# Patient Record
Sex: Female | Born: 1935
Health system: Southern US, Community
[De-identification: ages and names within clinical notes are randomized; demographics above are authoritative.]

## PROBLEM LIST (undated history)

## (undated) DIAGNOSIS — G629 Polyneuropathy, unspecified: Secondary | ICD-10-CM

## (undated) DIAGNOSIS — G250 Essential tremor: Secondary | ICD-10-CM

## (undated) DIAGNOSIS — I10 Essential (primary) hypertension: Secondary | ICD-10-CM

## (undated) DIAGNOSIS — K219 Gastro-esophageal reflux disease without esophagitis: Secondary | ICD-10-CM

## (undated) DIAGNOSIS — E119 Type 2 diabetes mellitus without complications: Secondary | ICD-10-CM

## (undated) DIAGNOSIS — W19XXXA Unspecified fall, initial encounter: Secondary | ICD-10-CM

## (undated) HISTORY — DX: Essential tremor: G25.0

## (undated) HISTORY — PX: BREAST LUMPECTOMY: SHX2

## (undated) HISTORY — DX: Polyneuropathy, unspecified: G62.9

## (undated) HISTORY — DX: Type 2 diabetes mellitus without complications: E11.9

## (undated) HISTORY — DX: Gastro-esophageal reflux disease without esophagitis: K21.9

## (undated) HISTORY — DX: Essential (primary) hypertension: I10

---

## 1973-01-26 HISTORY — PX: ABDOMINAL HYSTERECTOMY: SHX81

## 2014-01-29 DIAGNOSIS — E119 Type 2 diabetes mellitus without complications: Secondary | ICD-10-CM | POA: Diagnosis not present

## 2014-01-29 DIAGNOSIS — G8929 Other chronic pain: Secondary | ICD-10-CM | POA: Diagnosis not present

## 2014-01-29 DIAGNOSIS — G20 Parkinson's disease: Secondary | ICD-10-CM | POA: Diagnosis not present

## 2014-01-29 DIAGNOSIS — S329XXD Fracture of unspecified parts of lumbosacral spine and pelvis, subsequent encounter for fracture with routine healing: Secondary | ICD-10-CM | POA: Diagnosis not present

## 2014-01-29 DIAGNOSIS — I10 Essential (primary) hypertension: Secondary | ICD-10-CM | POA: Diagnosis not present

## 2014-01-29 DIAGNOSIS — M159 Polyosteoarthritis, unspecified: Secondary | ICD-10-CM | POA: Diagnosis not present

## 2014-01-30 DIAGNOSIS — I10 Essential (primary) hypertension: Secondary | ICD-10-CM | POA: Diagnosis not present

## 2014-01-30 DIAGNOSIS — G8929 Other chronic pain: Secondary | ICD-10-CM | POA: Diagnosis not present

## 2014-01-30 DIAGNOSIS — G20 Parkinson's disease: Secondary | ICD-10-CM | POA: Diagnosis not present

## 2014-01-30 DIAGNOSIS — M159 Polyosteoarthritis, unspecified: Secondary | ICD-10-CM | POA: Diagnosis not present

## 2014-01-30 DIAGNOSIS — S329XXD Fracture of unspecified parts of lumbosacral spine and pelvis, subsequent encounter for fracture with routine healing: Secondary | ICD-10-CM | POA: Diagnosis not present

## 2014-01-30 DIAGNOSIS — E119 Type 2 diabetes mellitus without complications: Secondary | ICD-10-CM | POA: Diagnosis not present

## 2014-02-01 DIAGNOSIS — G20 Parkinson's disease: Secondary | ICD-10-CM | POA: Diagnosis not present

## 2014-02-01 DIAGNOSIS — M159 Polyosteoarthritis, unspecified: Secondary | ICD-10-CM | POA: Diagnosis not present

## 2014-02-01 DIAGNOSIS — I10 Essential (primary) hypertension: Secondary | ICD-10-CM | POA: Diagnosis not present

## 2014-02-01 DIAGNOSIS — S329XXD Fracture of unspecified parts of lumbosacral spine and pelvis, subsequent encounter for fracture with routine healing: Secondary | ICD-10-CM | POA: Diagnosis not present

## 2014-02-01 DIAGNOSIS — G8929 Other chronic pain: Secondary | ICD-10-CM | POA: Diagnosis not present

## 2014-02-01 DIAGNOSIS — E119 Type 2 diabetes mellitus without complications: Secondary | ICD-10-CM | POA: Diagnosis not present

## 2014-02-05 DIAGNOSIS — G8929 Other chronic pain: Secondary | ICD-10-CM | POA: Diagnosis not present

## 2014-02-05 DIAGNOSIS — M159 Polyosteoarthritis, unspecified: Secondary | ICD-10-CM | POA: Diagnosis not present

## 2014-02-05 DIAGNOSIS — I10 Essential (primary) hypertension: Secondary | ICD-10-CM | POA: Diagnosis not present

## 2014-02-05 DIAGNOSIS — G20 Parkinson's disease: Secondary | ICD-10-CM | POA: Diagnosis not present

## 2014-02-05 DIAGNOSIS — E119 Type 2 diabetes mellitus without complications: Secondary | ICD-10-CM | POA: Diagnosis not present

## 2014-02-05 DIAGNOSIS — S329XXD Fracture of unspecified parts of lumbosacral spine and pelvis, subsequent encounter for fracture with routine healing: Secondary | ICD-10-CM | POA: Diagnosis not present

## 2014-02-06 DIAGNOSIS — S32509A Unspecified fracture of unspecified pubis, initial encounter for closed fracture: Secondary | ICD-10-CM | POA: Diagnosis not present

## 2014-02-07 DIAGNOSIS — E119 Type 2 diabetes mellitus without complications: Secondary | ICD-10-CM | POA: Diagnosis not present

## 2014-02-07 DIAGNOSIS — I10 Essential (primary) hypertension: Secondary | ICD-10-CM | POA: Diagnosis not present

## 2014-02-07 DIAGNOSIS — G20 Parkinson's disease: Secondary | ICD-10-CM | POA: Diagnosis not present

## 2014-02-07 DIAGNOSIS — G8929 Other chronic pain: Secondary | ICD-10-CM | POA: Diagnosis not present

## 2014-02-07 DIAGNOSIS — S329XXD Fracture of unspecified parts of lumbosacral spine and pelvis, subsequent encounter for fracture with routine healing: Secondary | ICD-10-CM | POA: Diagnosis not present

## 2014-02-07 DIAGNOSIS — M159 Polyosteoarthritis, unspecified: Secondary | ICD-10-CM | POA: Diagnosis not present

## 2014-02-08 DIAGNOSIS — E119 Type 2 diabetes mellitus without complications: Secondary | ICD-10-CM | POA: Diagnosis not present

## 2014-02-08 DIAGNOSIS — S329XXD Fracture of unspecified parts of lumbosacral spine and pelvis, subsequent encounter for fracture with routine healing: Secondary | ICD-10-CM | POA: Diagnosis not present

## 2014-02-08 DIAGNOSIS — I10 Essential (primary) hypertension: Secondary | ICD-10-CM | POA: Diagnosis not present

## 2014-02-08 DIAGNOSIS — G8929 Other chronic pain: Secondary | ICD-10-CM | POA: Diagnosis not present

## 2014-02-08 DIAGNOSIS — G20 Parkinson's disease: Secondary | ICD-10-CM | POA: Diagnosis not present

## 2014-02-08 DIAGNOSIS — M159 Polyosteoarthritis, unspecified: Secondary | ICD-10-CM | POA: Diagnosis not present

## 2014-02-13 DIAGNOSIS — E119 Type 2 diabetes mellitus without complications: Secondary | ICD-10-CM | POA: Diagnosis not present

## 2014-02-13 DIAGNOSIS — S329XXD Fracture of unspecified parts of lumbosacral spine and pelvis, subsequent encounter for fracture with routine healing: Secondary | ICD-10-CM | POA: Diagnosis not present

## 2014-02-13 DIAGNOSIS — I10 Essential (primary) hypertension: Secondary | ICD-10-CM | POA: Diagnosis not present

## 2014-02-13 DIAGNOSIS — M159 Polyosteoarthritis, unspecified: Secondary | ICD-10-CM | POA: Diagnosis not present

## 2014-02-13 DIAGNOSIS — G8929 Other chronic pain: Secondary | ICD-10-CM | POA: Diagnosis not present

## 2014-02-13 DIAGNOSIS — G20 Parkinson's disease: Secondary | ICD-10-CM | POA: Diagnosis not present

## 2014-02-14 DIAGNOSIS — E119 Type 2 diabetes mellitus without complications: Secondary | ICD-10-CM | POA: Diagnosis not present

## 2014-02-14 DIAGNOSIS — S329XXD Fracture of unspecified parts of lumbosacral spine and pelvis, subsequent encounter for fracture with routine healing: Secondary | ICD-10-CM | POA: Diagnosis not present

## 2014-02-14 DIAGNOSIS — I10 Essential (primary) hypertension: Secondary | ICD-10-CM | POA: Diagnosis not present

## 2014-02-14 DIAGNOSIS — G20 Parkinson's disease: Secondary | ICD-10-CM | POA: Diagnosis not present

## 2014-02-14 DIAGNOSIS — G8929 Other chronic pain: Secondary | ICD-10-CM | POA: Diagnosis not present

## 2014-02-14 DIAGNOSIS — M159 Polyosteoarthritis, unspecified: Secondary | ICD-10-CM | POA: Diagnosis not present

## 2014-02-19 DIAGNOSIS — M159 Polyosteoarthritis, unspecified: Secondary | ICD-10-CM | POA: Diagnosis not present

## 2014-02-19 DIAGNOSIS — I10 Essential (primary) hypertension: Secondary | ICD-10-CM | POA: Diagnosis not present

## 2014-02-19 DIAGNOSIS — G8929 Other chronic pain: Secondary | ICD-10-CM | POA: Diagnosis not present

## 2014-02-19 DIAGNOSIS — S329XXD Fracture of unspecified parts of lumbosacral spine and pelvis, subsequent encounter for fracture with routine healing: Secondary | ICD-10-CM | POA: Diagnosis not present

## 2014-02-19 DIAGNOSIS — G20 Parkinson's disease: Secondary | ICD-10-CM | POA: Diagnosis not present

## 2014-02-19 DIAGNOSIS — E119 Type 2 diabetes mellitus without complications: Secondary | ICD-10-CM | POA: Diagnosis not present

## 2014-02-20 DIAGNOSIS — Z6824 Body mass index (BMI) 24.0-24.9, adult: Secondary | ICD-10-CM | POA: Diagnosis not present

## 2014-02-20 DIAGNOSIS — F418 Other specified anxiety disorders: Secondary | ICD-10-CM | POA: Diagnosis not present

## 2014-02-20 DIAGNOSIS — E559 Vitamin D deficiency, unspecified: Secondary | ICD-10-CM | POA: Diagnosis not present

## 2014-02-20 DIAGNOSIS — S32464S Nondisplaced associated transverse-posterior fracture of right acetabulum, sequela: Secondary | ICD-10-CM | POA: Diagnosis not present

## 2014-02-20 DIAGNOSIS — G20 Parkinson's disease: Secondary | ICD-10-CM | POA: Diagnosis not present

## 2014-02-20 DIAGNOSIS — M159 Polyosteoarthritis, unspecified: Secondary | ICD-10-CM | POA: Diagnosis not present

## 2014-02-20 DIAGNOSIS — K219 Gastro-esophageal reflux disease without esophagitis: Secondary | ICD-10-CM | POA: Diagnosis not present

## 2014-02-20 DIAGNOSIS — F17200 Nicotine dependence, unspecified, uncomplicated: Secondary | ICD-10-CM | POA: Diagnosis not present

## 2014-02-20 DIAGNOSIS — R1084 Generalized abdominal pain: Secondary | ICD-10-CM | POA: Diagnosis not present

## 2014-02-20 DIAGNOSIS — E119 Type 2 diabetes mellitus without complications: Secondary | ICD-10-CM | POA: Diagnosis not present

## 2014-02-20 DIAGNOSIS — Z0000 Encounter for general adult medical examination without abnormal findings: Secondary | ICD-10-CM | POA: Diagnosis not present

## 2014-02-23 DIAGNOSIS — G8929 Other chronic pain: Secondary | ICD-10-CM | POA: Diagnosis not present

## 2014-02-23 DIAGNOSIS — M159 Polyosteoarthritis, unspecified: Secondary | ICD-10-CM | POA: Diagnosis not present

## 2014-02-23 DIAGNOSIS — I10 Essential (primary) hypertension: Secondary | ICD-10-CM | POA: Diagnosis not present

## 2014-02-23 DIAGNOSIS — G20 Parkinson's disease: Secondary | ICD-10-CM | POA: Diagnosis not present

## 2014-02-23 DIAGNOSIS — S329XXD Fracture of unspecified parts of lumbosacral spine and pelvis, subsequent encounter for fracture with routine healing: Secondary | ICD-10-CM | POA: Diagnosis not present

## 2014-02-23 DIAGNOSIS — E119 Type 2 diabetes mellitus without complications: Secondary | ICD-10-CM | POA: Diagnosis not present

## 2014-03-13 DIAGNOSIS — G243 Spasmodic torticollis: Secondary | ICD-10-CM | POA: Diagnosis not present

## 2014-05-08 DIAGNOSIS — J329 Chronic sinusitis, unspecified: Secondary | ICD-10-CM | POA: Diagnosis not present

## 2014-05-08 DIAGNOSIS — Z5320 Procedure and treatment not carried out because of patient's decision for unspecified reasons: Secondary | ICD-10-CM | POA: Diagnosis not present

## 2014-05-08 DIAGNOSIS — F418 Other specified anxiety disorders: Secondary | ICD-10-CM | POA: Diagnosis not present

## 2014-05-08 DIAGNOSIS — Z6825 Body mass index (BMI) 25.0-25.9, adult: Secondary | ICD-10-CM | POA: Diagnosis not present

## 2014-05-08 DIAGNOSIS — S32464S Nondisplaced associated transverse-posterior fracture of right acetabulum, sequela: Secondary | ICD-10-CM | POA: Diagnosis not present

## 2014-05-08 DIAGNOSIS — Z0001 Encounter for general adult medical examination with abnormal findings: Secondary | ICD-10-CM | POA: Diagnosis not present

## 2014-05-08 DIAGNOSIS — G20 Parkinson's disease: Secondary | ICD-10-CM | POA: Diagnosis not present

## 2014-05-08 DIAGNOSIS — K219 Gastro-esophageal reflux disease without esophagitis: Secondary | ICD-10-CM | POA: Diagnosis not present

## 2014-05-08 DIAGNOSIS — E1140 Type 2 diabetes mellitus with diabetic neuropathy, unspecified: Secondary | ICD-10-CM | POA: Diagnosis not present

## 2014-05-08 DIAGNOSIS — I10 Essential (primary) hypertension: Secondary | ICD-10-CM | POA: Diagnosis not present

## 2014-06-18 DIAGNOSIS — G243 Spasmodic torticollis: Secondary | ICD-10-CM | POA: Diagnosis not present

## 2014-06-21 DIAGNOSIS — H52222 Regular astigmatism, left eye: Secondary | ICD-10-CM | POA: Diagnosis not present

## 2014-06-21 DIAGNOSIS — H25013 Cortical age-related cataract, bilateral: Secondary | ICD-10-CM | POA: Diagnosis not present

## 2014-07-02 DIAGNOSIS — H269 Unspecified cataract: Secondary | ICD-10-CM | POA: Diagnosis not present

## 2014-07-02 DIAGNOSIS — Z6826 Body mass index (BMI) 26.0-26.9, adult: Secondary | ICD-10-CM | POA: Diagnosis not present

## 2014-07-02 DIAGNOSIS — Z01818 Encounter for other preprocedural examination: Secondary | ICD-10-CM | POA: Diagnosis not present

## 2014-07-02 DIAGNOSIS — S32464S Nondisplaced associated transverse-posterior fracture of right acetabulum, sequela: Secondary | ICD-10-CM | POA: Diagnosis not present

## 2014-07-02 DIAGNOSIS — E1140 Type 2 diabetes mellitus with diabetic neuropathy, unspecified: Secondary | ICD-10-CM | POA: Diagnosis not present

## 2014-07-02 DIAGNOSIS — F418 Other specified anxiety disorders: Secondary | ICD-10-CM | POA: Diagnosis not present

## 2014-07-02 DIAGNOSIS — G20 Parkinson's disease: Secondary | ICD-10-CM | POA: Diagnosis not present

## 2014-07-25 DIAGNOSIS — I10 Essential (primary) hypertension: Secondary | ICD-10-CM | POA: Diagnosis not present

## 2014-07-25 DIAGNOSIS — E049 Nontoxic goiter, unspecified: Secondary | ICD-10-CM | POA: Diagnosis not present

## 2014-07-25 DIAGNOSIS — H25011 Cortical age-related cataract, right eye: Secondary | ICD-10-CM | POA: Diagnosis not present

## 2014-07-25 DIAGNOSIS — F418 Other specified anxiety disorders: Secondary | ICD-10-CM | POA: Diagnosis not present

## 2014-07-25 DIAGNOSIS — H25811 Combined forms of age-related cataract, right eye: Secondary | ICD-10-CM | POA: Diagnosis not present

## 2014-08-16 DIAGNOSIS — R05 Cough: Secondary | ICD-10-CM | POA: Diagnosis not present

## 2014-08-16 DIAGNOSIS — H6092 Unspecified otitis externa, left ear: Secondary | ICD-10-CM | POA: Diagnosis not present

## 2014-08-16 DIAGNOSIS — J020 Streptococcal pharyngitis: Secondary | ICD-10-CM | POA: Diagnosis not present

## 2014-08-16 DIAGNOSIS — J0100 Acute maxillary sinusitis, unspecified: Secondary | ICD-10-CM | POA: Diagnosis not present

## 2014-08-29 DIAGNOSIS — F418 Other specified anxiety disorders: Secondary | ICD-10-CM | POA: Diagnosis not present

## 2014-08-29 DIAGNOSIS — H25012 Cortical age-related cataract, left eye: Secondary | ICD-10-CM | POA: Diagnosis not present

## 2014-08-29 DIAGNOSIS — H268 Other specified cataract: Secondary | ICD-10-CM | POA: Diagnosis not present

## 2014-08-29 DIAGNOSIS — E119 Type 2 diabetes mellitus without complications: Secondary | ICD-10-CM | POA: Diagnosis not present

## 2014-08-29 DIAGNOSIS — H25812 Combined forms of age-related cataract, left eye: Secondary | ICD-10-CM | POA: Diagnosis not present

## 2014-08-29 DIAGNOSIS — I10 Essential (primary) hypertension: Secondary | ICD-10-CM | POA: Diagnosis not present

## 2014-08-29 DIAGNOSIS — H52202 Unspecified astigmatism, left eye: Secondary | ICD-10-CM | POA: Diagnosis not present

## 2014-08-30 DIAGNOSIS — Z961 Presence of intraocular lens: Secondary | ICD-10-CM | POA: Diagnosis not present

## 2014-10-12 DIAGNOSIS — G243 Spasmodic torticollis: Secondary | ICD-10-CM | POA: Diagnosis not present

## 2014-10-15 DIAGNOSIS — Z23 Encounter for immunization: Secondary | ICD-10-CM | POA: Diagnosis not present

## 2014-10-15 DIAGNOSIS — Z6827 Body mass index (BMI) 27.0-27.9, adult: Secondary | ICD-10-CM | POA: Diagnosis not present

## 2014-10-15 DIAGNOSIS — Z0001 Encounter for general adult medical examination with abnormal findings: Secondary | ICD-10-CM | POA: Diagnosis not present

## 2014-10-15 DIAGNOSIS — I10 Essential (primary) hypertension: Secondary | ICD-10-CM | POA: Diagnosis not present

## 2014-10-15 DIAGNOSIS — K219 Gastro-esophageal reflux disease without esophagitis: Secondary | ICD-10-CM | POA: Diagnosis not present

## 2014-10-15 DIAGNOSIS — F17200 Nicotine dependence, unspecified, uncomplicated: Secondary | ICD-10-CM | POA: Diagnosis not present

## 2014-10-15 DIAGNOSIS — E1140 Type 2 diabetes mellitus with diabetic neuropathy, unspecified: Secondary | ICD-10-CM | POA: Diagnosis not present

## 2014-10-15 DIAGNOSIS — R05 Cough: Secondary | ICD-10-CM | POA: Diagnosis not present

## 2014-10-15 DIAGNOSIS — F418 Other specified anxiety disorders: Secondary | ICD-10-CM | POA: Diagnosis not present

## 2014-10-15 DIAGNOSIS — G20 Parkinson's disease: Secondary | ICD-10-CM | POA: Diagnosis not present

## 2014-10-15 DIAGNOSIS — S32464S Nondisplaced associated transverse-posterior fracture of right acetabulum, sequela: Secondary | ICD-10-CM | POA: Diagnosis not present

## 2014-11-14 DIAGNOSIS — K279 Peptic ulcer, site unspecified, unspecified as acute or chronic, without hemorrhage or perforation: Secondary | ICD-10-CM | POA: Diagnosis not present

## 2014-11-14 DIAGNOSIS — R251 Tremor, unspecified: Secondary | ICD-10-CM | POA: Diagnosis not present

## 2014-11-14 DIAGNOSIS — E1142 Type 2 diabetes mellitus with diabetic polyneuropathy: Secondary | ICD-10-CM | POA: Diagnosis not present

## 2014-11-14 DIAGNOSIS — G20 Parkinson's disease: Secondary | ICD-10-CM | POA: Diagnosis not present

## 2014-11-14 DIAGNOSIS — B370 Candidal stomatitis: Secondary | ICD-10-CM | POA: Diagnosis not present

## 2014-11-14 DIAGNOSIS — Z1389 Encounter for screening for other disorder: Secondary | ICD-10-CM | POA: Diagnosis not present

## 2014-11-14 DIAGNOSIS — E119 Type 2 diabetes mellitus without complications: Secondary | ICD-10-CM | POA: Diagnosis not present

## 2014-11-14 DIAGNOSIS — K8020 Calculus of gallbladder without cholecystitis without obstruction: Secondary | ICD-10-CM | POA: Diagnosis not present

## 2014-11-14 DIAGNOSIS — F325 Major depressive disorder, single episode, in full remission: Secondary | ICD-10-CM | POA: Diagnosis not present

## 2014-11-14 DIAGNOSIS — G629 Polyneuropathy, unspecified: Secondary | ICD-10-CM | POA: Diagnosis not present

## 2014-11-14 DIAGNOSIS — L299 Pruritus, unspecified: Secondary | ICD-10-CM | POA: Diagnosis not present

## 2014-11-14 DIAGNOSIS — I10 Essential (primary) hypertension: Secondary | ICD-10-CM | POA: Diagnosis not present

## 2014-11-21 ENCOUNTER — Ambulatory Visit: Payer: Self-pay | Admitting: Neurology

## 2014-12-24 ENCOUNTER — Ambulatory Visit: Payer: Self-pay | Admitting: Neurology

## 2014-12-26 ENCOUNTER — Ambulatory Visit: Payer: Self-pay | Admitting: Neurology

## 2015-01-11 ENCOUNTER — Ambulatory Visit: Payer: Self-pay | Admitting: Neurology

## 2015-03-20 ENCOUNTER — Encounter: Payer: Self-pay | Admitting: Neurology

## 2015-03-25 ENCOUNTER — Other Ambulatory Visit: Payer: Self-pay

## 2015-03-25 ENCOUNTER — Encounter: Payer: Self-pay | Admitting: Neurology

## 2015-03-25 ENCOUNTER — Ambulatory Visit (INDEPENDENT_AMBULATORY_CARE_PROVIDER_SITE_OTHER): Payer: Medicare Other | Admitting: Neurology

## 2015-03-25 VITALS — BP 120/70 | HR 62

## 2015-03-25 DIAGNOSIS — L299 Pruritus, unspecified: Secondary | ICD-10-CM | POA: Diagnosis not present

## 2015-03-25 DIAGNOSIS — K279 Peptic ulcer, site unspecified, unspecified as acute or chronic, without hemorrhage or perforation: Secondary | ICD-10-CM | POA: Diagnosis not present

## 2015-03-25 DIAGNOSIS — G629 Polyneuropathy, unspecified: Secondary | ICD-10-CM | POA: Diagnosis not present

## 2015-03-25 DIAGNOSIS — G250 Essential tremor: Secondary | ICD-10-CM | POA: Diagnosis not present

## 2015-03-25 DIAGNOSIS — N183 Chronic kidney disease, stage 3 (moderate): Secondary | ICD-10-CM | POA: Diagnosis not present

## 2015-03-25 DIAGNOSIS — E1142 Type 2 diabetes mellitus with diabetic polyneuropathy: Secondary | ICD-10-CM | POA: Diagnosis not present

## 2015-03-25 DIAGNOSIS — G4700 Insomnia, unspecified: Secondary | ICD-10-CM

## 2015-03-25 DIAGNOSIS — Z1389 Encounter for screening for other disorder: Secondary | ICD-10-CM | POA: Diagnosis not present

## 2015-03-25 DIAGNOSIS — F325 Major depressive disorder, single episode, in full remission: Secondary | ICD-10-CM | POA: Diagnosis not present

## 2015-03-25 DIAGNOSIS — I10 Essential (primary) hypertension: Secondary | ICD-10-CM | POA: Diagnosis not present

## 2015-03-25 DIAGNOSIS — R251 Tremor, unspecified: Secondary | ICD-10-CM | POA: Diagnosis not present

## 2015-03-25 DIAGNOSIS — F17210 Nicotine dependence, cigarettes, uncomplicated: Secondary | ICD-10-CM | POA: Diagnosis not present

## 2015-03-25 DIAGNOSIS — E782 Mixed hyperlipidemia: Secondary | ICD-10-CM | POA: Diagnosis not present

## 2015-03-25 DIAGNOSIS — Z0000 Encounter for general adult medical examination without abnormal findings: Secondary | ICD-10-CM | POA: Diagnosis not present

## 2015-03-25 DIAGNOSIS — Z1231 Encounter for screening mammogram for malignant neoplasm of breast: Secondary | ICD-10-CM

## 2015-03-25 MED ORDER — CLONAZEPAM 1 MG PO TABS: 1.5000 mg | ORAL_TABLET | Freq: Every day | ORAL | Status: DC

## 2015-03-25 NOTE — Progress Notes (Signed)
Kristen Mason was seen today in the movement disorders clinic for neurologic consultation at the request of HUSAIN,KARRAR, MD.   The patient presents today regarding her Parkinson's disease, accompanied by her daughter who supplements the history.  She was previously cared for in [place], [place].  I do not have any of her prior neurology records.  She has been scheduled here multiple previous times but has cancelled.  The patient reports that she was told she had "parkinsonian tremor."   She started having tremor in both hands about 11-2 years ago.  She is right hand dominant but both shake about the same.  She states that head tremor started increasing about 5 years ago and her prior neurologist gave her botox for that.  Her last botox was in September, 2016 and it helped.  She is on a rather large dose of primidone, 250 mg twice a day that she states that she takes for tremor.  It was initially helpful but now she is not so sure.   She is also on carbidopa/levodopa 25/100, one tablet 3 times per day.  She was started on that at the same time as primidone 9 years ago and then it was d/c and she tried something else (they don't remember the name of that) and it didn't work and she went back on the levodopa.    She uses clonazepam, 1 mg, and takes 1 tablets at night for sleep and if she wakes up she will take an extra 1/2 tablet.  She is also on 800 mg 3 times a day of gabapentin for diabetic peripheral neuropathy.   Specific Symptoms:  Tremor: Yes.     Affected by caffeine:  No. (3 cups coffee per day)  Affected by alcohol:  Doesn't drink any  Affected by stress:  Yes.    Affected by fatigue:  No.  Spills soup if on spoon:  Yes.   (has weighted spoons)  Spills glass of liquid if full:  Yes.    Affects ADL's (tying shoes, brushing teeth, etc):  Yes.     Voice: yes x 5 years Sleep: using klonopin to sleep  Vivid Dreams:  No.  Acting out dreams:  No. Wet Pillows: No. Postural symptoms:  Yes.      Falls?  Yes.   (last fall 2 months ago; does well with walker; falls when she is trying to pick something up - no spontaneous falls) Bradykinesia symptoms: no bradykinesia noted Loss of smell:  No. Loss of taste:  No. Urinary Incontinence:  No. Difficulty Swallowing:  Yes.  , rarely due to goiter Handwriting, micrographia: Yes.   but mostly just tremulous Trouble with ADL's:  No., except needs help putting on bra Depression:  Yes.   (only because couldn't get out of the house regularly) Memory changes:  No. Hallucinations:  No.  visual distortions: No. N/V:  No.   Neuroimaging has previously been performed.  It is not available for my review today.  It was done after a fall years ago    ALLERGIES:  No Known Allergies  CURRENT MEDICATIONS:  Outpatient Encounter Prescriptions as of 03/25/2015  Medication Sig  . amLODipine (NORVASC) 10 MG tablet Take 10 mg by mouth daily.  Marland Kitchen aspirin 81 MG tablet Take 81 mg by mouth daily.  . carbidopa-levodopa (SINEMET IR) 25-100 MG tablet Take 1 tablet by mouth 3 (three) times daily.  . citalopram (CELEXA) 40 MG tablet Take 40 mg by mouth daily.  . clonazePAM (KLONOPIN)  1 MG tablet Take 1.5 mg by mouth at bedtime.  . dicyclomine (BENTYL) 10 MG capsule Take 10 mg by mouth 4 (four) times daily -  before meals and at bedtime.  . diphenhydrAMINE (SOMINEX) 25 MG tablet Take 25 mg by mouth at bedtime as needed for sleep.  Marland Kitchen gabapentin (NEURONTIN) 800 MG tablet Take 800 mg by mouth 3 (three) times daily.  . hydrOXYzine (ATARAX/VISTARIL) 25 MG tablet Take 25 mg by mouth at bedtime.  Marland Kitchen lisinopril (PRINIVIL,ZESTRIL) 20 MG tablet Take 20 mg by mouth daily.  . pantoprazole (PROTONIX) 40 MG tablet Take 40 mg by mouth daily.  . primidone (MYSOLINE) 250 MG tablet Take 250 mg by mouth 2 (two) times daily.  . traMADol (ULTRAM) 50 MG tablet Take 50 mg by mouth every 6 (six) hours as needed.   No facility-administered encounter medications on file as of 03/25/2015.     PAST MEDICAL HISTORY:   Past Medical History  Diagnosis Date  . Essential tremor   . Hypertension   . Diabetes ([place])   . GERD (gastroesophageal reflux disease)   . Peripheral neuropathy ([place])     PAST SURGICAL HISTORY:   Past Surgical History  Procedure Laterality Date  . Abdominal hysterectomy  1975  . Breast lumpectomy Right     non cancerous    SOCIAL HISTORY:   Social History   Social History  . Marital Status: Unknown    Spouse Name: N/A  . Number of Children: N/A  . Years of Education: N/A   Occupational History  . Not on file.   Social History Main Topics  . Smoking status: Current Every Day Smoker -- 1.00 packs/day    Types: Cigarettes  . Smokeless tobacco: Not on file  . Alcohol Use: No  . Drug Use: No  . Sexual Activity: Not on file   Other Topics Concern  . Not on file   Social History Narrative  . No narrative on file    FAMILY HISTORY:   Family Status  Relation Status Death Age  . Mother Deceased     kidney problems  . Father Deceased     HTN, stroke, pacemaker  . Brother Deceased     MI    ROS:  A complete 10 system review of systems was obtained and was unremarkable apart from what is mentioned above.  PHYSICAL EXAMINATION:    VITALS:   Filed Vitals:   03/25/15 0839  BP: 120/70  Pulse: 62    GEN:  The patient appears stated age and is in NAD. HEENT:  Normocephalic, atraumatic.  The mucous membranes are moist. The superficial temporal arteries are without ropiness or tenderness. CV:  RRR Lungs:  CTAB Neck/HEME:  There are no carotid bruits bilaterally.  Neurological examination:  Orientation:  Montreal Cognitive Assessment  03/25/2015  Visuospatial/ Executive (0/5) 3  Naming (0/3) 2  Attention: Read list of digits (0/2) 2  Attention: Read list of letters (0/1) 1  Attention: Serial 7 subtraction starting at 100 (0/3) 1  Language: Repeat phrase (0/2) 2  Language : Fluency (0/1) 0  Abstraction (0/2) 2  Delayed Recall  (0/5) 4  Orientation (0/6) 6  Total 23  Adjusted Score (based on education) 24   Cranial nerves: There is good facial symmetry. Pupils are equal round and reactive to light bilaterally. Fundoscopic exam is attempted but disc margins are not well visualized. Extraocular muscles are intact. The visual fields are full to confrontational testing. The speech is fluent  and clear with significant vocal tremor. Soft palate rises symmetrically and there is no tongue deviation. Hearing is intact to conversational tone. Sensation: Sensation is intact to light and pinprick throughout (facial, trunk, extremities). Vibration is markedly decreased in a distal fashion. There is no extinction with double simultaneous stimulation. There is no sensory dermatomal level identified. Motor: Strength is 5/5 in the bilateral upper and lower extremities.   Shoulder shrug is equal and symmetric.  There is no pronator drift. Deep tendon reflexes: Deep tendon reflexes are 2-/4 at the bilateral biceps, triceps, brachioradialis, 1/4 at the bilateral patella and absent at the bilateral achilles. Plantar responses are downgoing bilaterally.  Movement examination: Tone: There is normal tone in the bilateral upper extremities.  The tone in the lower extremities is normal.  Abnormal movements: There is a resting tremor in the left greater than right upper extremity.  There is very significant intention tremor bilaterally.  There is complex head titubation, but there is no evidence of cervical dystonia.  Head titubation is mostly in the "yes" direction.  She has very significant difficulty with Archimedes spirals, and has difficulty just getting the pen on the paper.  She spills water all over the room and on herself when asked to pour a full glass of water from one glass to another. Coordination:  There is no decremation with RAM's, with any form of RAMS, including alternating supination and pronation of the forearm, hand opening and  closing, finger taps, heel taps and toe taps. Gait and Station: The patient has difficulty arising out of a deep-seated chair without the use of the hands and requires the use of a walker. The patient's stride length is normal with her walker.    ASSESSMENT/PLAN:  1.  Severe essential tremor.  -I told the patient and her daughter that I saw no evidence of Parkinson's disease.  She has had the symptoms for over a decade, so if she would have Parkinson's disease, this should have shown up by now.  She has rest tremor because of the severity of the essential tremor and the length of time it has been going on.  I would discontinue her levodopa.  -She will continue her primidone, 250 mg twice a day.  -I was very honest with the patient and her daughter.  I told her that I do not think any further medication additions are going to help tremor significantly.  Unfortunately, if she wants further tremor suppression, the only other option is going to be surgical options in the form of either deep brain stimulation or focused ultrasound.  We talked about both of these options.  We talked about risks and benefits.  She was given patient education on this.  I gave them the opportunity to ask questions and answered them to the best of my ability.  She is going to think about this and talk about it with her family.  -I will try to get a copy of records from her prior neurologist in [place].  She was apparently receiving Botox in the neck previously for head tremor.  However, I saw no evidence of cervical dystonia and I am not sure which muscles, therefore, were targeted.  I will review the records when available to me.  2.  Gait instability  -This is likely multifactorial, although diabetic peripheral neuropathy probably is the primary etiology.  Her daughter does state that it is much better controlled that was years ago.  Prior to the patient moving in with her daughter  10 years ago, the patient was on insulin and  metformin and she is now off of both of these medications.  She is scheduled to have her hemoglobin A1c repeated later today.  She will remain on gabapentin, 800 mg 3 times per day.  She definitely has hand and feet paresthesias.  I will refill this when she needs it.  -It is believed that ET alone can cause ataxia/balance changes due to changes in the cerebellar purkinje cell/climbing fiber synaptic transmission.  This certainly could be another etiology for her gait changes.  3.  Insomnia  -Reports that she is doing better with clonazepam.  I really do not like this on the long-term, but she seems to be doing well with that and is not having cognitive side effects or falls.  I did refill this medication today cautiously.  She is on 1 mg at night.  Risks, benefits, side effects and alternative therapies were discussed.  The opportunity to ask questions was given and they were answered to the best of my ability.  The patient expressed understanding and willingness to follow the outlined treatment protocols.  4.  Follow up is anticipated in the next few months, sooner should new neurologic issues arise.  Much greater than 50% of this visit was spent in counseling with the patient and the family.  Total face to face time:  60 min

## 2015-03-25 NOTE — Addendum Note (Signed)
Addended byAnnamaria Helling on: 03/25/2015 11:09 AM   Modules accepted: Orders

## 2015-03-25 NOTE — Patient Instructions (Signed)
1. Stop Carbidopa Levodopa     Deep Brain Stimulation  Is it the right choice for me?   What is Deep Brain Stimulation (DBS) Surgery?  DBS is a surgical procedure used to treat symptoms of Essential Tremor (ET). It involves the implantation of an electrode into the brain either on one side or both sides. The area of the brain that is typically targeted in ET is the ventral intermediate (VIM) nucleus of the thalamus.   How does DBS work?  The tremor in ET is due to an overactive and oscillating circuit in the brain. With DBS, electricity is sent down the electrodes into this area of the brain to disrupt this abnormal circuit, thereby blocking the tremor. It is important to remember, however, that DBS does not "cure" the disease, but rather is a method of treating the symptoms.   What is involved in the procedure?  The surgical procedure involves the implantation of either one (to treat tremor on one side of the body) or two (to treat tremor on both sides of the body) electrodes. The electrodes are connected to a wire, which is then connected to a generator (either one or two) in the chest. The generator (and wires) are placed under the skin similar to a cardiac pacemaker, thus the device itself is not visible. There will,  however, be a visible lump under the skin where the pacemaker is placed. There will also be small bumps on the scalp where the electrode is secured to the skull.   What symptoms can I expect DBS to treat?  In ET the main symptom is the tremor which the DBS system will treat.   What is the downside to having DBS surgery?  There are 2 major factors that need to be considered prior to having surgery, 1) Risks involved, and 2) the "inconvenience factor".   What are the risks of surgery?  Because the surgery involves introducing a foreign object into the brain, there are inherent risks that are present. First, there is a very small risk, approximately 1-2% chance, of having  bleeding into the brain causing symptoms similar to that of a stroke. Secondly, there is a 5-10% chance of having an infection related to the procedure. If the device gets infected, then the treatment usually requires that the infected hardware be removed temporarily while antibiotics are given. After the infection is resolved, then the hardware needs to be re-implanted. This would not leave the patient with permanent problems, but it is easy to understand how disappointed someone might be if they have to go through the surgery again. Symptoms of infection include redness, swelling, or pain around the device. Another risk of brain surgery includes possible seizure. Seizures are abnormal electrical discharges of brain cells. If a seizure occurs, it is almost always at the time of operation. It may require temporarily being treated with seizure medications, but this is typically only short term. Very rarely (much less than 1%) does the infection become more serious and involve the brain.  In some patients with ET we will place electrodes on both sides of the brain to treat tremor on both sides of the body. In these patients there is a 30% chance of causing speech or balance problems when both sides are fully activated. To get rid of this side effect, the intensity of stimulation usually needs to be lowered in one of the electrodes. If this is done, the side effect may well resolve, but some of the tremor may  return.  If the electrodes are perfectly placed and programmed, we can expect an impressive improvement in your tremor. If they are close, but not quite perfectly placed, then there may be some residual tremor  which does not resolve with treatment. This is the main reason we go to such lengths the day of the electrode implantation to be sure we have the electrode optimally placed.   What is the "inconvenience factor"?  Unfortunately, undergoing DBS surgery is a process involving multiple steps. Even prior  to surgery, you will have several visits (which are explained in detail on the subsequent page). The surgery itself takes place in three separate parts. About a week prior to insertion of the electrodes, you will be seen in an ambulatory surgery center to put in markers into the skull, called fiducials. This allows [place] to plan the surgery and to better localize the area in which we will operate. One week later, stage 1 of the procedure will be done in which the electrodes are implanted. Approximately one week later, stage 2 of the surgery will be done in which the generator (battery) is inserted. Weeks later, programming of the device will take place. Programming is fine-tuned over a series of clinic visits, initially 1-2x per week, and then eventually less frequent. Overall, you can expect at least 8-10 visits prior to seeing benefit from DBS surgery.   Is DBS the right choice for you?  As you can now see, there are many things that carefully need to be considered when making this decision. The ultimate decision is yours to make. ET is not a life threatening disease. It can be very disabling and significantly interfere with one's quality of life. It is our job to provide you with all the pros and cons that can help you make the right choice for you. The main question is whether the tremor is so bothersome to you that it is worth it to take a small but significant risk to treat it. We hope this information will guide you in your decision.   Pre-Operative Visits:  1) During this visit your exam will be videotaped with your consent. We will discuss the details of the surgery and answer any more questions you might have.  2) Neuropsychological Testing. This is standard testing in all potential candidates to help determine those patients that may be at high risk for developing worsening cognition from the procedure. This is a long clinic visit (multiple hours) and can be quite exhausting.  3) Pre-Operative MRI.  If you are deemed to be a good surgical candidate based on the above 2 visits, you will need to have MRI imaging done to be used in the surgical planning process. You will need someone to accompany you to this visit that can drive, as sometimes it is necessary to sedate you for the MRI in order to get adequate pictures.  4) You will also have an appointment with the neurosurgeon who works with Dr. Carles Collet. His name is Dr. Vertell Limber.  What to expect regarding the surgery itself:  1. The first step involves placement of the fiducial pins. You are given 5 local injections of anesthetic (numbing medication). Next, 5 pins are screwed into the skull. Following the placement of the pins, you will be transferred down to have a head CT scan. This is used in planning for the surgery. This step is generally done one week prior to scheduled surgery.  2. One week later, you will arrive to the pre-op  area OFF of any tremor medications that may have been prescribed. You will meet nursing and anesthesia staff. A catheter may be placed into the bladder.  3. You will have the sense of "hurry up and wait" multiple times throughout the day, but it is extremely important to remain as patient as possible. It is during these times that we are busy "behind the scenes" doing the surgical planning with all of the imaging scans that you've had done.  4. You are then brought into the OR suite and placed in a "beach chair"-like position. You will not be under general anesthesia. We need you to be awake during certain parts to allow [place] to do important testing. Because you are awake and having brain surgery, it is not surprising that this is very anxiety provoking and scary! We understand this and will help you through it to the best of our abilities. The actual surgical procedure is not painful. It is done with local anesthetic agents. However, the procedure can take up to 6-8 hours, and it is expected that you'll become uncomfortable. We try to  minimize any sedating medications, but will give you pain medicines if needed.  5. You will have a bad haircut, but it will grow back!  6. Following the surgery, you will stay overnight in the hospital for observation.  7. The following day, you will have a very special kind brain MRI scan to allow [place] to evaluate the placement of the electrodes and also to make sure there were no bleeding complications. Provided there are no complications, you will be discharged home the day after surgery.   * It is extremely important to remember that after having DBS surgery, you will no longer be able to have a typical MRI scan. This can lead to heating of the electrode wires causing serious burns to the brain and even death.   8. About 1 weeks later you will return for an outpatient surgery that lasts 1-2 hours during which the generator(s) will be placed. You will go home on the same day as the surgery. You will find that you are more uncomfortable after this surgery then your first surgery. You will be given medications to help with this. The pain from this surgery usually resolves in 2-3 days.

## 2015-04-11 ENCOUNTER — Ambulatory Visit
Admission: RE | Admit: 2015-04-11 | Discharge: 2015-04-11 | Disposition: A | Discharge status: Home / Self Care | Payer: Medicare Other | Source: Ambulatory Visit

## 2015-04-11 DIAGNOSIS — Z1231 Encounter for screening mammogram for malignant neoplasm of breast: Secondary | ICD-10-CM

## 2015-04-15 DIAGNOSIS — C44729 Squamous cell carcinoma of skin of left lower limb, including hip: Secondary | ICD-10-CM | POA: Diagnosis not present

## 2015-06-25 ENCOUNTER — Ambulatory Visit: Payer: Medicare Other | Admitting: Neurology

## 2015-10-28 ENCOUNTER — Telehealth: Payer: Self-pay | Admitting: Neurology

## 2015-10-28 NOTE — Telephone Encounter (Signed)
RX denied and faxed to pharmacy.

## 2015-10-28 NOTE — Telephone Encounter (Signed)
She no showed her may follow up appt.  Cannot refill.

## 2015-10-28 NOTE — Telephone Encounter (Signed)
Received refill request from [facility] for Klonopin 1 mg tablet to take one at bedtime. Last note state you had refilled cautiously as patient was using for insomnia. You have only seen patient once in February. She does not have a follow up scheduled. Please advise.

## 2016-02-10 ENCOUNTER — Other Ambulatory Visit: Payer: Self-pay | Admitting: Internal Medicine

## 2016-02-10 ENCOUNTER — Ambulatory Visit
Admission: RE | Admit: 2016-02-10 | Discharge: 2016-02-10 | Disposition: A | Discharge status: Home / Self Care | Payer: Medicare Other | Source: Ambulatory Visit | Attending: Internal Medicine | Admitting: Internal Medicine

## 2016-02-10 DIAGNOSIS — G629 Polyneuropathy, unspecified: Secondary | ICD-10-CM | POA: Diagnosis not present

## 2016-02-10 DIAGNOSIS — E782 Mixed hyperlipidemia: Secondary | ICD-10-CM | POA: Diagnosis not present

## 2016-02-10 DIAGNOSIS — Z23 Encounter for immunization: Secondary | ICD-10-CM | POA: Diagnosis not present

## 2016-02-10 DIAGNOSIS — F17210 Nicotine dependence, cigarettes, uncomplicated: Secondary | ICD-10-CM

## 2016-02-10 DIAGNOSIS — N183 Chronic kidney disease, stage 3 (moderate): Secondary | ICD-10-CM | POA: Diagnosis not present

## 2016-02-10 DIAGNOSIS — F325 Major depressive disorder, single episode, in full remission: Secondary | ICD-10-CM | POA: Diagnosis not present

## 2016-02-10 DIAGNOSIS — E1142 Type 2 diabetes mellitus with diabetic polyneuropathy: Secondary | ICD-10-CM | POA: Diagnosis not present

## 2016-02-10 DIAGNOSIS — Z7984 Long term (current) use of oral hypoglycemic drugs: Secondary | ICD-10-CM | POA: Diagnosis not present

## 2016-02-10 DIAGNOSIS — R918 Other nonspecific abnormal finding of lung field: Secondary | ICD-10-CM | POA: Diagnosis not present

## 2016-02-10 DIAGNOSIS — K219 Gastro-esophageal reflux disease without esophagitis: Secondary | ICD-10-CM | POA: Diagnosis not present

## 2016-02-10 DIAGNOSIS — R300 Dysuria: Secondary | ICD-10-CM | POA: Diagnosis not present

## 2016-02-10 DIAGNOSIS — I10 Essential (primary) hypertension: Secondary | ICD-10-CM | POA: Diagnosis not present

## 2016-02-10 DIAGNOSIS — M1990 Unspecified osteoarthritis, unspecified site: Secondary | ICD-10-CM | POA: Diagnosis not present

## 2016-02-10 DIAGNOSIS — R251 Tremor, unspecified: Secondary | ICD-10-CM | POA: Diagnosis not present

## 2016-02-11 ENCOUNTER — Other Ambulatory Visit: Payer: Self-pay | Admitting: Internal Medicine

## 2016-02-11 DIAGNOSIS — R918 Other nonspecific abnormal finding of lung field: Secondary | ICD-10-CM

## 2016-02-17 ENCOUNTER — Ambulatory Visit
Admission: RE | Admit: 2016-02-17 | Discharge: 2016-02-17 | Disposition: A | Discharge status: Home / Self Care | Payer: Medicare Other | Source: Ambulatory Visit | Attending: Internal Medicine | Admitting: Internal Medicine

## 2016-02-17 DIAGNOSIS — R918 Other nonspecific abnormal finding of lung field: Secondary | ICD-10-CM | POA: Diagnosis not present

## 2016-02-17 MED ORDER — IOPAMIDOL (ISOVUE-300) INJECTION 61%
75.0000 mL | Freq: Once | INTRAVENOUS | Status: AC | PRN
Start: 2016-02-17 — End: 2016-02-17
  Administered 2016-02-17: 75 mL via INTRAVENOUS

## 2016-03-16 ENCOUNTER — Ambulatory Visit: Payer: Medicare Other | Admitting: Podiatry

## 2016-03-17 ENCOUNTER — Ambulatory Visit: Payer: Medicare Other | Admitting: Podiatry

## 2016-06-24 ENCOUNTER — Ambulatory Visit (INDEPENDENT_AMBULATORY_CARE_PROVIDER_SITE_OTHER): Payer: Medicare Other | Admitting: Podiatry

## 2016-06-24 ENCOUNTER — Encounter: Payer: Self-pay | Admitting: Podiatry

## 2016-06-24 VITALS — BP 141/83 | HR 76

## 2016-06-24 DIAGNOSIS — E0842 Diabetes mellitus due to underlying condition with diabetic polyneuropathy: Secondary | ICD-10-CM

## 2016-06-24 DIAGNOSIS — B351 Tinea unguium: Secondary | ICD-10-CM | POA: Diagnosis not present

## 2016-06-24 DIAGNOSIS — M79676 Pain in unspecified toe(s): Secondary | ICD-10-CM

## 2016-06-24 NOTE — Progress Notes (Signed)
   Subjective:    Patient ID: Kristen Mason, female    DOB: [birth year], 82 y.o.   MRN: [id number]  HPI    Review of Systems  Gastrointestinal: Positive for abdominal pain.  Neurological: Positive for tremors and numbness.       Objective:   Physical Exam        Assessment & Plan:

## 2016-06-25 NOTE — Progress Notes (Signed)
   SUBJECTIVE Patient with a history of diabetes mellitus presents to office today complaining of elongated, thickened nails that have been present for the past 6 months. She denies pain due to neuropathy. Patient is unable to trim their own nails.   OBJECTIVE General Patient is awake, alert, and oriented x 3 and in no acute distress. Derm Skin is dry and supple bilateral. Negative open lesions or macerations. Remaining integument unremarkable. Nails are tender, long, thickened and dystrophic with subungual debris, consistent with onychomycosis, 1-5 bilateral. No signs of infection noted. Vasc  DP and PT pedal pulses palpable bilaterally. Temperature gradient within normal limits.  Neuro Epicritic and protective threshold sensation diminished bilaterally.  Musculoskeletal Exam No symptomatic pedal deformities noted bilateral. Muscular strength within normal limits.  ASSESSMENT 1. Diabetes Mellitus w/ peripheral neuropathy 2. Onychomycosis of nail due to dermatophyte bilateral 3. Pain in foot bilateral  PLAN OF CARE 1. Patient evaluated today. 2. Instructed to maintain good pedal hygiene and foot care. Stressed importance of controlling blood sugar.  3. Mechanical debridement of nails 1-5 bilaterally performed using a nail nipper. Filed with dremel without incident.  4. Return to clinic in 3 mos.     Edrick Kins, DPM [facility]  Dr. Edrick Kins, [address]                                        [place], [place] [address]                Office [phone]  Fax [phone]

## 2016-07-23 ENCOUNTER — Ambulatory Visit: Payer: Medicare Other | Admitting: Physical Medicine & Rehabilitation

## 2016-08-07 ENCOUNTER — Encounter: Payer: Medicare Other | Admitting: Physical Medicine & Rehabilitation

## 2016-08-31 ENCOUNTER — Other Ambulatory Visit: Payer: Self-pay | Admitting: Internal Medicine

## 2016-08-31 DIAGNOSIS — K589 Irritable bowel syndrome without diarrhea: Secondary | ICD-10-CM | POA: Diagnosis not present

## 2016-08-31 DIAGNOSIS — E1142 Type 2 diabetes mellitus with diabetic polyneuropathy: Secondary | ICD-10-CM | POA: Diagnosis not present

## 2016-08-31 DIAGNOSIS — G629 Polyneuropathy, unspecified: Secondary | ICD-10-CM | POA: Diagnosis not present

## 2016-08-31 DIAGNOSIS — R911 Solitary pulmonary nodule: Secondary | ICD-10-CM | POA: Diagnosis not present

## 2016-08-31 DIAGNOSIS — E782 Mixed hyperlipidemia: Secondary | ICD-10-CM | POA: Diagnosis not present

## 2016-08-31 DIAGNOSIS — N183 Chronic kidney disease, stage 3 (moderate): Secondary | ICD-10-CM | POA: Diagnosis not present

## 2016-08-31 DIAGNOSIS — G20 Parkinson's disease: Secondary | ICD-10-CM | POA: Diagnosis not present

## 2016-08-31 DIAGNOSIS — I10 Essential (primary) hypertension: Secondary | ICD-10-CM | POA: Diagnosis not present

## 2016-08-31 DIAGNOSIS — F325 Major depressive disorder, single episode, in full remission: Secondary | ICD-10-CM | POA: Diagnosis not present

## 2016-08-31 DIAGNOSIS — E1165 Type 2 diabetes mellitus with hyperglycemia: Secondary | ICD-10-CM | POA: Diagnosis not present

## 2016-08-31 DIAGNOSIS — R251 Tremor, unspecified: Secondary | ICD-10-CM | POA: Diagnosis not present

## 2016-08-31 DIAGNOSIS — K279 Peptic ulcer, site unspecified, unspecified as acute or chronic, without hemorrhage or perforation: Secondary | ICD-10-CM | POA: Diagnosis not present

## 2016-09-07 ENCOUNTER — Inpatient Hospital Stay
Admission: RE | Admit: 2016-09-07 | Discharge: 2016-09-07 | Disposition: A | Discharge status: Home / Self Care | Payer: Medicare Other | Source: Ambulatory Visit | Attending: Internal Medicine | Admitting: Internal Medicine

## 2016-09-22 ENCOUNTER — Ambulatory Visit: Payer: Medicare Other | Admitting: Podiatry

## 2016-09-29 ENCOUNTER — Ambulatory Visit
Admission: RE | Admit: 2016-09-29 | Discharge: 2016-09-29 | Disposition: A | Discharge status: Home / Self Care | Payer: Medicare Other | Source: Ambulatory Visit | Attending: Internal Medicine | Admitting: Internal Medicine

## 2016-09-29 DIAGNOSIS — R911 Solitary pulmonary nodule: Secondary | ICD-10-CM

## 2016-10-01 ENCOUNTER — Other Ambulatory Visit: Payer: Self-pay | Admitting: Internal Medicine

## 2016-10-01 DIAGNOSIS — R911 Solitary pulmonary nodule: Secondary | ICD-10-CM

## 2016-10-31 DIAGNOSIS — R300 Dysuria: Secondary | ICD-10-CM | POA: Diagnosis not present

## 2017-03-30 ENCOUNTER — Inpatient Hospital Stay: Admission: RE | Admit: 2017-03-30 | Payer: Medicare Other | Source: Ambulatory Visit

## 2017-04-22 ENCOUNTER — Ambulatory Visit
Admission: RE | Admit: 2017-04-22 | Discharge: 2017-04-22 | Disposition: A | Discharge status: Home / Self Care | Payer: Medicare Other | Source: Ambulatory Visit | Attending: Internal Medicine | Admitting: Internal Medicine

## 2017-04-22 DIAGNOSIS — R911 Solitary pulmonary nodule: Secondary | ICD-10-CM

## 2017-04-27 ENCOUNTER — Other Ambulatory Visit: Payer: Self-pay

## 2017-04-27 ENCOUNTER — Other Ambulatory Visit (HOSPITAL_COMMUNITY): Payer: Self-pay | Admitting: Internal Medicine

## 2017-04-27 DIAGNOSIS — R918 Other nonspecific abnormal finding of lung field: Secondary | ICD-10-CM

## 2017-05-05 ENCOUNTER — Encounter (HOSPITAL_COMMUNITY)
Admission: RE | Admit: 2017-05-05 | Discharge: 2017-05-05 | Disposition: A | Discharge status: Home / Self Care | Payer: Medicare Other | Source: Ambulatory Visit | Attending: Internal Medicine | Admitting: Internal Medicine

## 2017-05-05 DIAGNOSIS — R918 Other nonspecific abnormal finding of lung field: Secondary | ICD-10-CM | POA: Insufficient documentation

## 2017-05-05 LAB — GLUCOSE, CAPILLARY: GLUCOSE-CAPILLARY: 95 mg/dL (ref 65–99)

## 2017-05-05 MED ORDER — FLUDEOXYGLUCOSE F - 18 (FDG) INJECTION
8.4500 | Freq: Once | INTRAVENOUS | Status: AC | PRN
  Administered 2017-05-05: 8.45 via INTRAVENOUS

## 2017-06-07 ENCOUNTER — Inpatient Hospital Stay (HOSPITAL_COMMUNITY)
Admission: EM | Admit: 2017-06-07 | Discharge: 2017-06-11 | DRG: 183 | Disposition: A | Discharge status: Skilled Nursing Facility | Payer: Medicare Other | Attending: Internal Medicine | Admitting: Internal Medicine

## 2017-06-07 ENCOUNTER — Emergency Department (HOSPITAL_COMMUNITY): Payer: Medicare Other

## 2017-06-07 ENCOUNTER — Encounter (HOSPITAL_COMMUNITY): Payer: Self-pay | Admitting: Emergency Medicine

## 2017-06-07 ENCOUNTER — Observation Stay (HOSPITAL_COMMUNITY): Payer: Medicare Other

## 2017-06-07 ENCOUNTER — Other Ambulatory Visit: Payer: Self-pay

## 2017-06-07 DIAGNOSIS — J441 Chronic obstructive pulmonary disease with (acute) exacerbation: Secondary | ICD-10-CM | POA: Diagnosis not present

## 2017-06-07 DIAGNOSIS — I10 Essential (primary) hypertension: Secondary | ICD-10-CM | POA: Diagnosis present

## 2017-06-07 DIAGNOSIS — R42 Dizziness and giddiness: Secondary | ICD-10-CM | POA: Diagnosis not present

## 2017-06-07 DIAGNOSIS — F329 Major depressive disorder, single episode, unspecified: Secondary | ICD-10-CM | POA: Diagnosis present

## 2017-06-07 DIAGNOSIS — E1142 Type 2 diabetes mellitus with diabetic polyneuropathy: Secondary | ICD-10-CM | POA: Diagnosis present

## 2017-06-07 DIAGNOSIS — S2241XA Multiple fractures of ribs, right side, initial encounter for closed fracture: Principal | ICD-10-CM | POA: Diagnosis present

## 2017-06-07 DIAGNOSIS — Z66 Do not resuscitate: Secondary | ICD-10-CM | POA: Diagnosis present

## 2017-06-07 DIAGNOSIS — K219 Gastro-esophageal reflux disease without esophagitis: Secondary | ICD-10-CM | POA: Diagnosis present

## 2017-06-07 DIAGNOSIS — R0902 Hypoxemia: Secondary | ICD-10-CM | POA: Diagnosis not present

## 2017-06-07 DIAGNOSIS — R0789 Other chest pain: Secondary | ICD-10-CM | POA: Diagnosis not present

## 2017-06-07 DIAGNOSIS — F17210 Nicotine dependence, cigarettes, uncomplicated: Secondary | ICD-10-CM | POA: Diagnosis present

## 2017-06-07 DIAGNOSIS — J9601 Acute respiratory failure with hypoxia: Secondary | ICD-10-CM | POA: Diagnosis present

## 2017-06-07 DIAGNOSIS — G250 Essential tremor: Secondary | ICD-10-CM | POA: Diagnosis present

## 2017-06-07 DIAGNOSIS — Z79899 Other long term (current) drug therapy: Secondary | ICD-10-CM

## 2017-06-07 DIAGNOSIS — Y92009 Unspecified place in unspecified non-institutional (private) residence as the place of occurrence of the external cause: Secondary | ICD-10-CM

## 2017-06-07 DIAGNOSIS — S299XXA Unspecified injury of thorax, initial encounter: Secondary | ICD-10-CM | POA: Diagnosis not present

## 2017-06-07 DIAGNOSIS — Y92129 Unspecified place in nursing home as the place of occurrence of the external cause: Secondary | ICD-10-CM

## 2017-06-07 DIAGNOSIS — R0781 Pleurodynia: Secondary | ICD-10-CM | POA: Diagnosis not present

## 2017-06-07 DIAGNOSIS — E119 Type 2 diabetes mellitus without complications: Secondary | ICD-10-CM

## 2017-06-07 DIAGNOSIS — T148XXA Other injury of unspecified body region, initial encounter: Secondary | ICD-10-CM | POA: Diagnosis not present

## 2017-06-07 DIAGNOSIS — Z794 Long term (current) use of insulin: Secondary | ICD-10-CM

## 2017-06-07 DIAGNOSIS — F17200 Nicotine dependence, unspecified, uncomplicated: Secondary | ICD-10-CM | POA: Diagnosis present

## 2017-06-07 DIAGNOSIS — R0602 Shortness of breath: Secondary | ICD-10-CM | POA: Diagnosis not present

## 2017-06-07 DIAGNOSIS — F419 Anxiety disorder, unspecified: Secondary | ICD-10-CM | POA: Diagnosis present

## 2017-06-07 DIAGNOSIS — R296 Repeated falls: Secondary | ICD-10-CM | POA: Diagnosis present

## 2017-06-07 DIAGNOSIS — S2249XA Multiple fractures of ribs, unspecified side, initial encounter for closed fracture: Secondary | ICD-10-CM | POA: Diagnosis present

## 2017-06-07 DIAGNOSIS — W01198A Fall on same level from slipping, tripping and stumbling with subsequent striking against other object, initial encounter: Secondary | ICD-10-CM | POA: Diagnosis present

## 2017-06-07 DIAGNOSIS — W19XXXA Unspecified fall, initial encounter: Secondary | ICD-10-CM

## 2017-06-07 HISTORY — DX: Unspecified fall, initial encounter: W19.XXXA

## 2017-06-07 LAB — CBC WITH DIFFERENTIAL/PLATELET
Basophils Absolute: 0 10*3/uL (ref 0.0–0.1)
Basophils Relative: 0 %
EOS ABS: 0.3 10*3/uL (ref 0.0–0.7)
EOS PCT: 7 %
HCT: 39.9 % (ref 36.0–46.0)
Hemoglobin: 12.4 g/dL (ref 12.0–15.0)
LYMPHS ABS: 0.7 10*3/uL (ref 0.7–4.0)
Lymphocytes Relative: 15 %
MCH: 31.7 pg (ref 26.0–34.0)
MCHC: 31.1 g/dL (ref 30.0–36.0)
MCV: 102 fL — ABNORMAL HIGH (ref 78.0–100.0)
MONO ABS: 0.4 10*3/uL (ref 0.1–1.0)
MONOS PCT: 9 %
Neutro Abs: 3.3 10*3/uL (ref 1.7–7.7)
Neutrophils Relative %: 69 %
PLATELETS: 158 10*3/uL (ref 150–400)
RBC: 3.91 MIL/uL (ref 3.87–5.11)
RDW: 15.6 % — AB (ref 11.5–15.5)
WBC: 4.7 10*3/uL (ref 4.0–10.5)

## 2017-06-07 LAB — URINALYSIS, ROUTINE W REFLEX MICROSCOPIC
Bacteria, UA: NONE SEEN
Bilirubin Urine: NEGATIVE
GLUCOSE, UA: NEGATIVE mg/dL
Hgb urine dipstick: NEGATIVE
Ketones, ur: NEGATIVE mg/dL
LEUKOCYTES UA: NEGATIVE
Nitrite: POSITIVE — AB
PH: 6 (ref 5.0–8.0)
Protein, ur: 30 mg/dL — AB
SPECIFIC GRAVITY, URINE: 1.013 (ref 1.005–1.030)

## 2017-06-07 LAB — CBG MONITORING, ED
Glucose-Capillary: 154 mg/dL — ABNORMAL HIGH (ref 65–99)
Glucose-Capillary: 189 mg/dL — ABNORMAL HIGH (ref 65–99)

## 2017-06-07 LAB — COMPREHENSIVE METABOLIC PANEL
ALT: 6 U/L — AB (ref 14–54)
AST: 17 U/L (ref 15–41)
Albumin: 3.6 g/dL (ref 3.5–5.0)
Alkaline Phosphatase: 114 U/L (ref 38–126)
Anion gap: 8 (ref 5–15)
BUN: 14 mg/dL (ref 6–20)
CHLORIDE: 104 mmol/L (ref 101–111)
CO2: 27 mmol/L (ref 22–32)
CREATININE: 1 mg/dL (ref 0.44–1.00)
Calcium: 8.7 mg/dL — ABNORMAL LOW (ref 8.9–10.3)
GFR calc Af Amer: >60 mL/min (ref >60)
GFR, EST NON AFRICAN AMERICAN: 52 mL/min — AB (ref >60)
Glucose, Bld: 165 mg/dL — ABNORMAL HIGH (ref 65–99)
Potassium: 4.5 mmol/L (ref 3.5–5.1)
Sodium: 139 mmol/L (ref 135–145)
Total Bilirubin: 0.5 mg/dL (ref 0.3–1.2)
Total Protein: 7.2 g/dL (ref 6.5–8.1)

## 2017-06-07 LAB — BRAIN NATRIURETIC PEPTIDE: B Natriuretic Peptide: 113.6 pg/mL — ABNORMAL HIGH (ref 0.0–100.0)

## 2017-06-07 LAB — GLUCOSE, CAPILLARY: GLUCOSE-CAPILLARY: 191 mg/dL — AB (ref 65–99)

## 2017-06-07 LAB — HEMOGLOBIN A1C
HEMOGLOBIN A1C: 6.6 % — AB (ref 4.8–5.6)
MEAN PLASMA GLUCOSE: 142.72 mg/dL

## 2017-06-07 LAB — I-STAT CG4 LACTIC ACID, ED: LACTIC ACID, VENOUS: 0.96 mmol/L (ref 0.5–1.9)

## 2017-06-07 MED ORDER — CITALOPRAM HYDROBROMIDE 20 MG PO TABS
40.0000 mg | ORAL_TABLET | Freq: Every day | ORAL | Status: DC
  Administered 2017-06-08 – 2017-06-11 (×4): 40 mg via ORAL
  Filled 2017-06-07: qty 2
  Filled 2017-06-07: qty 1
  Filled 2017-06-07 (×3): qty 2

## 2017-06-07 MED ORDER — IOPAMIDOL (ISOVUE-370) INJECTION 76%
INTRAVENOUS | Status: AC
  Administered 2017-06-07: 100 mL
  Filled 2017-06-07: qty 100

## 2017-06-07 MED ORDER — ALBUTEROL SULFATE (2.5 MG/3ML) 0.083% IN NEBU: 2.5000 mg | INHALATION_SOLUTION | RESPIRATORY_TRACT | Status: DC | PRN

## 2017-06-07 MED ORDER — ONDANSETRON HCL 4 MG/2ML IJ SOLN
4.0000 mg | Freq: Four times a day (QID) | INTRAMUSCULAR | Status: DC | PRN
  Administered 2017-06-08 – 2017-06-10 (×2): 4 mg via INTRAVENOUS
  Filled 2017-06-07 (×2): qty 2

## 2017-06-07 MED ORDER — LIDOCAINE 5 % EX PTCH
1.0000 | MEDICATED_PATCH | CUTANEOUS | Status: DC
  Administered 2017-06-07 – 2017-06-10 (×4): 1 via TRANSDERMAL
  Filled 2017-06-07 (×4): qty 1

## 2017-06-07 MED ORDER — PANTOPRAZOLE SODIUM 40 MG PO TBEC
40.0000 mg | DELAYED_RELEASE_TABLET | Freq: Every day | ORAL | Status: DC
  Administered 2017-06-08 – 2017-06-11 (×4): 40 mg via ORAL
  Filled 2017-06-07 (×5): qty 1

## 2017-06-07 MED ORDER — HYDRALAZINE HCL 20 MG/ML IJ SOLN: 5.0000 mg | Freq: Four times a day (QID) | INTRAMUSCULAR | Status: DC | PRN

## 2017-06-07 MED ORDER — INSULIN ASPART 100 UNIT/ML ~~LOC~~ SOLN
0.0000 [IU] | Freq: Three times a day (TID) | SUBCUTANEOUS | Status: DC
  Administered 2017-06-07: 3 [IU] via SUBCUTANEOUS
  Administered 2017-06-08: 2 [IU] via SUBCUTANEOUS
  Administered 2017-06-08 – 2017-06-09 (×5): 3 [IU] via SUBCUTANEOUS
  Administered 2017-06-10 (×2): 5 [IU] via SUBCUTANEOUS
  Administered 2017-06-10 – 2017-06-11 (×2): 3 [IU] via SUBCUTANEOUS
  Administered 2017-06-11: 8 [IU] via SUBCUTANEOUS
  Filled 2017-06-07: qty 1

## 2017-06-07 MED ORDER — TRAMADOL HCL 50 MG PO TABS
50.0000 mg | ORAL_TABLET | Freq: Four times a day (QID) | ORAL | Status: DC | PRN
  Administered 2017-06-07 (×2): 50 mg via ORAL
  Filled 2017-06-07 (×3): qty 1

## 2017-06-07 MED ORDER — IPRATROPIUM-ALBUTEROL 0.5-2.5 (3) MG/3ML IN SOLN
3.0000 mL | Freq: Three times a day (TID) | RESPIRATORY_TRACT | Status: DC
  Administered 2017-06-08: 3 mL via RESPIRATORY_TRACT

## 2017-06-07 MED ORDER — METHYLPREDNISOLONE SODIUM SUCC 125 MG IJ SOLR
80.0000 mg | Freq: Once | INTRAMUSCULAR | Status: AC
  Administered 2017-06-07: 80 mg via INTRAVENOUS
  Filled 2017-06-07: qty 2

## 2017-06-07 MED ORDER — ENOXAPARIN SODIUM 40 MG/0.4ML ~~LOC~~ SOLN
40.0000 mg | SUBCUTANEOUS | Status: DC
  Administered 2017-06-07 – 2017-06-10 (×4): 40 mg via SUBCUTANEOUS
  Filled 2017-06-07 (×5): qty 0.4

## 2017-06-07 MED ORDER — MORPHINE SULFATE (PF) 4 MG/ML IV SOLN
2.0000 mg | INTRAVENOUS | Status: DC | PRN
  Administered 2017-06-07 – 2017-06-11 (×16): 2 mg via INTRAVENOUS
  Filled 2017-06-07 (×18): qty 1

## 2017-06-07 MED ORDER — PREDNISONE 20 MG PO TABS: 40.0000 mg | ORAL_TABLET | Freq: Every day | ORAL | Status: DC

## 2017-06-07 MED ORDER — DOCUSATE SODIUM 100 MG PO CAPS
100.0000 mg | ORAL_CAPSULE | Freq: Two times a day (BID) | ORAL | Status: DC
  Administered 2017-06-07 – 2017-06-11 (×8): 100 mg via ORAL
  Filled 2017-06-07 (×8): qty 1

## 2017-06-07 MED ORDER — PRIMIDONE 250 MG PO TABS
250.0000 mg | ORAL_TABLET | Freq: Two times a day (BID) | ORAL | Status: DC
  Administered 2017-06-07 – 2017-06-11 (×8): 250 mg via ORAL
  Filled 2017-06-07 (×9): qty 1

## 2017-06-07 MED ORDER — GABAPENTIN 400 MG PO CAPS
800.0000 mg | ORAL_CAPSULE | Freq: Three times a day (TID) | ORAL | Status: DC
  Administered 2017-06-07 – 2017-06-11 (×12): 800 mg via ORAL
  Filled 2017-06-07 (×12): qty 2

## 2017-06-07 MED ORDER — AMLODIPINE BESYLATE 10 MG PO TABS
10.0000 mg | ORAL_TABLET | Freq: Every day | ORAL | Status: DC
  Administered 2017-06-08 – 2017-06-11 (×4): 10 mg via ORAL
  Filled 2017-06-07 (×5): qty 1

## 2017-06-07 MED ORDER — INSULIN ASPART 100 UNIT/ML ~~LOC~~ SOLN: 0.0000 [IU] | Freq: Every day | SUBCUTANEOUS | Status: DC

## 2017-06-07 MED ORDER — LISINOPRIL 20 MG PO TABS
20.0000 mg | ORAL_TABLET | Freq: Every day | ORAL | Status: DC
  Administered 2017-06-08 – 2017-06-11 (×4): 20 mg via ORAL
  Filled 2017-06-07 (×5): qty 1

## 2017-06-07 MED ORDER — CARBIDOPA-LEVODOPA 25-100 MG PO TABS
1.0000 | ORAL_TABLET | Freq: Three times a day (TID) | ORAL | Status: DC
  Administered 2017-06-07 – 2017-06-11 (×12): 1 via ORAL
  Filled 2017-06-07 (×13): qty 1

## 2017-06-07 MED ORDER — IPRATROPIUM-ALBUTEROL 0.5-2.5 (3) MG/3ML IN SOLN
3.0000 mL | Freq: Four times a day (QID) | RESPIRATORY_TRACT | Status: DC
  Administered 2017-06-07 (×2): 3 mL via RESPIRATORY_TRACT
  Filled 2017-06-07 (×2): qty 3

## 2017-06-07 MED ORDER — ASPIRIN EC 81 MG PO TBEC
81.0000 mg | DELAYED_RELEASE_TABLET | Freq: Every day | ORAL | Status: DC
  Administered 2017-06-08 – 2017-06-11 (×4): 81 mg via ORAL
  Filled 2017-06-07 (×5): qty 1

## 2017-06-07 MED ORDER — DICYCLOMINE HCL 10 MG PO CAPS
10.0000 mg | ORAL_CAPSULE | Freq: Three times a day (TID) | ORAL | Status: DC
  Administered 2017-06-07 – 2017-06-11 (×16): 10 mg via ORAL
  Filled 2017-06-07 (×16): qty 1

## 2017-06-07 MED ORDER — LORATADINE 10 MG PO TABS
10.0000 mg | ORAL_TABLET | Freq: Every day | ORAL | Status: DC
  Administered 2017-06-08 – 2017-06-11 (×4): 10 mg via ORAL
  Filled 2017-06-07 (×5): qty 1

## 2017-06-07 MED ORDER — ONDANSETRON HCL 4 MG PO TABS
4.0000 mg | ORAL_TABLET | Freq: Four times a day (QID) | ORAL | Status: DC | PRN
  Administered 2017-06-07: 4 mg via ORAL
  Filled 2017-06-07: qty 1

## 2017-06-07 MED ORDER — ACETAMINOPHEN 650 MG RE SUPP: 650.0000 mg | Freq: Four times a day (QID) | RECTAL | Status: DC | PRN

## 2017-06-07 MED ORDER — IPRATROPIUM-ALBUTEROL 0.5-2.5 (3) MG/3ML IN SOLN
3.0000 mL | Freq: Once | RESPIRATORY_TRACT | Status: AC
  Administered 2017-06-07: 3 mL via RESPIRATORY_TRACT
  Filled 2017-06-07: qty 3

## 2017-06-07 MED ORDER — HYDROXYZINE HCL 25 MG PO TABS
25.0000 mg | ORAL_TABLET | Freq: Every day | ORAL | Status: DC
  Administered 2017-06-07 – 2017-06-10 (×4): 25 mg via ORAL
  Filled 2017-06-07 (×4): qty 1

## 2017-06-07 MED ORDER — ACETAMINOPHEN 325 MG PO TABS: 650.0000 mg | ORAL_TABLET | Freq: Four times a day (QID) | ORAL | Status: DC | PRN

## 2017-06-07 MED ORDER — CLONAZEPAM 1 MG PO TABS
1.0000 mg | ORAL_TABLET | Freq: Every day | ORAL | Status: DC
  Administered 2017-06-07 – 2017-06-10 (×4): 1 mg via ORAL
  Filled 2017-06-07 (×4): qty 1

## 2017-06-07 MED ORDER — NICOTINE 14 MG/24HR TD PT24
14.0000 mg | MEDICATED_PATCH | Freq: Every day | TRANSDERMAL | Status: DC
  Administered 2017-06-07 – 2017-06-11 (×5): 14 mg via TRANSDERMAL
  Filled 2017-06-07 (×5): qty 1

## 2017-06-07 NOTE — Clinical Social Work Note (Signed)
Clinical Social Work Assessment  Patient Details  Name: Kristen Mason MRN: [id number] Date of Birth: January 05, 1935  Date of referral:  06/07/17               Reason for consult:  Facility Placement                Permission sought to share information with:  Family Supports Permission granted to share information::  Yes, Verbal Permission Granted  Name::     Kristen Mason  Agency::  family  Relationship::   daughter Mining engineer Information:  Kristen Mason [phone]  Housing/Transportation Living arrangements for the past 2 months:  Apartment(with daughter ) Source of Information:  Patient, Adult Children Patient Interpreter Needed:  None Criminal Activity/Legal Involvement Pertinent to Current Situation/Hospitalization:  No - Comment as needed Significant Relationships:  Adult Children, Other Family Members Lives with:  Adult Children Do you feel safe going back to the place where you live?  Yes Need for family participation in patient care:  Yes (Comment)  Care giving concerns:  CSW spoke with pt and daughter at bedside. Pt expressed that pt is doing well overall and has no concerns other than why pt may be feeling dizzy at times. CSW expressed that staff is working to help pt as much as possible.    Social Worker assessment / plan:  CSW spoke with pt at bedside. During this time CSW was informed that pt is from home with daughter [name]. Pt reports that pt has been living with daughter for over 11 years and has support from daughter as well as daughter's son. CSW was informed that pt moved here from [place] three years ago where pt was in a rehab facility there for some time. Pt expressed being agreeable to rehab if needed at the time of discharge.   During this assessment pt was in bed. During the assessment pt would get a little tearful as pt expressed that pt is "hurting all over". CSW offered support to pt and daughter during this time.   Employment status:   Retired Forensic scientist:  Medicare PT Recommendations:  Not assessed at this time Information / Referral to community resources:  [facility](spoke with pt and daughter about placement options in [place]. )  Patient/Family's Response to care:  Pt's response to care appeared appropriate  and understanding to plan of care.    Patient/Family's Understanding of and Emotional Response to Diagnosis, Current Treatment, and Prognosis:  No further questions or concerns have been presented to CSW at this time. Emotional response to care was tearful but also thankful that pt knows and understands that pt has support from daughter who is at bedside with pt.   Emotional Assessment Appearance:  Appears stated age Attitude/Demeanor/Rapport:  Crying, Engaged Affect (typically observed):  Appropriate, Pleasant, Sad(sad about being in pain. ) Orientation:  Oriented to Situation, Oriented to  Time, Oriented to Place, Oriented to Self Alcohol / Substance use:  Not Applicable Psych involvement (Current and /or in the community):  No (Comment)(not at this time. )  Discharge Needs  Concerns to be addressed:  Denies Needs/Concerns at this time Readmission within the last 30 days:  No Current discharge risk:  Dependent with Mobility Barriers to Discharge:  Continued Medical Work up   [employer], [place] 06/07/2017, 12:50 PM

## 2017-06-07 NOTE — ED Notes (Signed)
Pt's CBG result was 189. Informed Kehinde - RN.

## 2017-06-07 NOTE — ED Notes (Signed)
Attempted to get pt's blood. Unable to get.

## 2017-06-07 NOTE — ED Notes (Signed)
Called [facility] at 14:05 and ordered pt Carb Modified fluid consistency lunch tray.

## 2017-06-07 NOTE — ED Notes (Signed)
Pt's dinner tray arrived. 

## 2017-06-07 NOTE — H&P (Signed)
History and Physical    Kristen Mason KVQ:[id number] DOB: 06-Sep-1934 DOA: 06/07/2017  PCP: Wenda Low, MD Consultants:  None Patient coming from:  Home - lives with daughter; Donald Prose: Daughter, [phone]  Chief Complaint: fall  HPI: Kristen Mason is a 83 y.o. female with medical history significant of HTN; DM; and GERD presenting with fall.  "I fell.  I got up out of bed, grabbed on to my walker and started walking.  Just all of the sudden I fell.  I been getting dizzy, I fell 3 times."  With the dizzy episodes, everything is spinning.  The spells have been happening for about a week.  No dizziness other than during the 3 episodes when she has fallen.  Today's dizziness was not associated with position changes; the prior two times it was when she was bending over to pick something up and standing back up.  This time the walker fell too and she hit her side on it.  She has had to have oxygen during prior hospitalizations but otherwise has no known h/o COPD or O2 dependence.  She is continuing to smoke.  Not planning to quit, she does need a patch.  She denies that she is SOB at her but her daughter feels like she has episodic heavy breathing at home, worse over the last few weeks.  Chronic cough, productive of whitish phlegm.  She is not on breathing treatments.     ED Course:   New O2 requirement, long-standing tobacco use, fell multiple times, rib fractures.  Not dizzy/light-headed.    Review of Systems: As per HPI; otherwise review of systems reviewed and negative.   Ambulatory Status:  Ambulates with a walker  Past Medical History:  Diagnosis Date  . Diabetes ([place])   . Essential tremor   . GERD (gastroesophageal reflux disease)   . Hypertension   . Peripheral neuropathy     Past Surgical History:  Procedure Laterality Date  . ABDOMINAL HYSTERECTOMY  1975  . BREAST LUMPECTOMY Right    non cancerous    Social History   Socioeconomic History  . Marital status: Unknown   Spouse name: Not on file  . Number of children: Not on file  . Years of education: Not on file  . Highest education level: Not on file  Occupational History  . Occupation: retired  Scientific laboratory technician  . Financial resource strain: Not on file  . Food insecurity:    Worry: Not on file    Inability: Not on file  . Transportation needs:    Medical: Not on file    Non-medical: Not on file  Tobacco Use  . Smoking status: Current Every Day Smoker    Packs/day: 1.00    Years: 62.00    Pack years: 62.00    Types: Cigarettes  . Smokeless tobacco: Current User  Substance and Sexual Activity  . Alcohol use: No    Alcohol/week: 0.0 oz  . Drug use: No  . Sexual activity: Not on file  Lifestyle  . Physical activity:    Days per week: Not on file    Minutes per session: Not on file  . Stress: Not on file  Relationships  . Social connections:    Talks on phone: Not on file    Gets together: Not on file    Attends religious service: Not on file    Active member of club or organization: Not on file    Attends meetings of clubs or organizations: Not on  file    Relationship status: Not on file  . Intimate partner violence:    Fear of current or ex partner: Not on file    Emotionally abused: Not on file    Physically abused: Not on file    Forced sexual activity: Not on file  Other Topics Concern  . Not on file  Social History Narrative  . Not on file    Allergies  Allergen Reactions  . Statins Other (See Comments)    History reviewed. No pertinent family history.  Prior to Admission medications   Medication Sig Start Date End Date Taking? Authorizing Provider  amLODipine (NORVASC) 10 MG tablet Take 10 mg by mouth daily.    [provider]  aspirin 81 MG tablet Take 81 mg by mouth daily.    [provider]  carbidopa-levodopa (SINEMET IR) 25-100 MG tablet Take 1 tablet by mouth 3 (three) times daily.    [provider]  citalopram (CELEXA) 40 MG tablet  Take 40 mg by mouth daily.    [provider]  clonazePAM (KLONOPIN) 1 MG tablet Take 1.5 tablets (1.5 mg total) by mouth at bedtime. 03/25/15   Tat, Eustace Quail, DO  dicyclomine (BENTYL) 10 MG capsule Take 10 mg by mouth 4 (four) times daily -  before meals and at bedtime.    [provider]  diphenhydrAMINE (SOMINEX) 25 MG tablet Take 25 mg by mouth at bedtime as needed for sleep.    [provider]  gabapentin (NEURONTIN) 800 MG tablet Take 800 mg by mouth 3 (three) times daily.    [provider]  hydrOXYzine (ATARAX/VISTARIL) 25 MG tablet Take 25 mg by mouth at bedtime.    [provider]  lisinopril (PRINIVIL,ZESTRIL) 20 MG tablet Take 20 mg by mouth daily.    [provider]  pantoprazole (PROTONIX) 40 MG tablet Take 40 mg by mouth daily.    [provider]  primidone (MYSOLINE) 250 MG tablet Take 250 mg by mouth 2 (two) times daily.    [provider]  traMADol (ULTRAM) 50 MG tablet Take 50 mg by mouth every 6 (six) hours as needed.    [provider]    Physical Exam: Vitals:   06/07/17 0808 06/07/17 0811 06/07/17 0830  BP: (!) 178/97  (!) 170/63  Pulse: 63  62  Resp: 16  17  Temp: 98.2 F (36.8 C)    SpO2: (!) 84%  95%  Weight:  70.8 kg (156 lb)   Height:  5\' 5"  (1.651 m)      General:  Appears calm and comfortable and is NAD; appears older than stated age Eyes:  PERRL, EOMI, normal lids, iris ENT:  grossly normal hearing, lips & tongue, mmm Neck:  no LAD, masses or thyromegaly; no carotid bruits Cardiovascular:  RRR, no m/r/g. No LE edema.  Respiratory:  Diffuse rhonchi with expiratory wheezes present.  Normal respiratory effort. Abdomen:  soft, NT, ND, NABS Skin:  no rash or induration seen on limited exam Musculoskeletal:  grossly normal tone BUE/BLE, good ROM, no bony abnormality Lower extremity:  No LE edema.  Limited foot exam with no ulcerations.  2+ distal pulses. Psychiatric:  grossly  normal mood and affect, speech fluent and appropriate, AOx3 Neurologic:  CN 2-12 grossly intact, moves all extremities in coordinated fashion, sensation intact; marked essential tremor    Radiological Exams on Admission: Dg Chest 2 View  Result Date: 06/07/2017 CLINICAL DATA:  84 year old female post fall with  right greater than left chest/rib pain. Initial encounter. EXAM: CHEST - 2 VIEW COMPARISON:  05/05/2017 head CT.  02/10/2016 chest x-ray. FINDINGS: On prior exams patient was noted to have remote rib fractures with callus formation right fifth through seventh rib. Minimal cortical irregularity right sixth rib and therefore acute right sixth rib fracture cannot be excluded. Rib detail films can be obtained for further delineation if clinically desired. No pneumothorax. PET CT detected pulmonary nodules not well delineated by plain film exam. 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 Heart size top-normal. Central pulmonary vascular prominence without pulmonary edema. Calcified aorta. No thoracic compression fracture noted. IMPRESSION: On prior exams patient was noted to have remote rib fractures with callus formation right fifth through seventh rib. Minimal cortical irregularity right sixth rib and therefore acute right sixth rib fracture cannot be excluded. Rib detail films can be obtained for further delineation if clinically desired. No pneumothorax. PET CT detected pulmonary nodules not well delineated by plain film exam. 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 Aortic Atherosclerosis (ICD10-I70.0). Electronically Signed   By: Genia Del M.D.   On: 06/07/2017 08:56   Dg Ribs Unilateral Right  Result Date: 06/07/2017 CLINICAL DATA:  Pain following fall EXAM: RIGHT RIBS - 2 VIEW COMPARISON:  Chest radiograph Jun 07, 2017 and chest CT April 22, 2017 FINDINGS: Oblique and cone-down rib images obtained. There are mildly displaced  fractures of the lateral right fourth, fifth, and sixth ribs which appear recent/acute. No pneumothorax or pleural effusion evident on the right. No other rib lesions evident. Right-sided thyroid enlargement with displacement of the upper thoracic trachea toward the left again noted. IMPRESSION: Acute fractures with mild displacement of the lateral right fourth, fifth, and sixth ribs. No evident pneumothorax or pleural effusion on the right. Right lung clear. Enlarged thyroid on the right, documented previously. Electronically Signed   By: Lowella Grip III M.D.   On: 06/07/2017 10:48   Ct Head Wo Contrast  Result Date: 06/07/2017 CLINICAL DATA:  Intermittent vertigo. EXAM: CT HEAD WITHOUT CONTRAST TECHNIQUE: Contiguous axial images were obtained from the base of the skull through the vertex without intravenous contrast. COMPARISON:  None. FINDINGS: Brain: No evidence of acute infarction, hemorrhage, hydrocephalus, extra-axial collection or mass lesion/mass effect. Mild age related cerebral atrophy. Scattered periventricular and subcortical white matter hypodensities are nonspecific, but favored to reflect chronic microvascular ischemic changes. Vascular: Atherosclerotic vascular calcification of the carotid siphons. No hyperdense vessel. Skull: Negative for fracture or focal lesion. Sinuses/Orbits: No acute finding. Other: None. IMPRESSION: 1.  No acute intracranial abnormality. 2. Mild age related cerebral atrophy and chronic microvascular ischemic changes. Electronically Signed   By: Titus Dubin M.D.   On: 06/07/2017 09:26    EKG: Independently reviewed.  NSR with rate 62; nonspecific ST changes with no evidence of acute ischemia   Labs on Admission: I have personally reviewed the available labs and imaging studies at the time of the admission.  Pertinent labs:   Glucose 165 BUN 14/Creatinine 1/GFR 52 BNP 113.6 Lactate 0.96 WBC 4.7  Assessment/Plan Principal Problem:   Acute  respiratory failure with hypoxia (HCC) Active Problems:   Fall at home, initial encounter   Multiple rib fractures   Controlled diabetes mellitus ([place])   Essential hypertension   Tobacco dependence   Acute respiratory failure with hypoxia -While this may be associated with acute rib fractures and splinting, it is actually much more likely associated with long-standing tobacco history  and probable COPD -She does not have fever or leukocytosis. Chest x-ray is not consistent with pneumonia -Will order chest CT for further evaluation, but suspect that this is going to be the start of chronic lung disease -will observe patient overnight -She does not appear to be in acute exacerbation, so will not give antibiotics -Nebulizers: scheduled Duoneb and prn albuterol -Solu-Medrol 80 mg IV with transition to PO prednisone  Rib fractures -Patient with significant pain from rib fractures that occurred from a fall -Will use Lidoderm patch for pain control with prn IV morphine -Anticipate spontaneous resolution with time  Falls -Related to dizziness, which is most likely associated with chronic hypoxia -Ongoing use of walker -Awaiting chest CT, as above  DM -Will check A1c -Cover with moderate-scale SSI  HTN -Continue home meds: Norvasc, Lisinopril  Tobacco dependence -Encourage cessation.  This was discussed with the patient and should be reviewed on an ongoing basis.  -Patch ordered at patient request.  DVT prophylaxis: Lovenox  Code Status:  DNR - confirmed with patient/family Family Communication: Daughter present throughout evaluation Disposition Plan:  Home once clinically improved Consults called: CM/SW/PT/OT/Nutrition/RT  Admission status: It is my clinical opinion that referral for OBSERVATION is reasonable and necessary in this patient based on the above information provided. The aforementioned taken together are felt to place the patient at high risk for further clinical  deterioration. However it is anticipated that the patient may be medically stable for discharge from the hospital within 24 to 48 hours.    Karmen Bongo MD [facility]  If note is complete, please contact covering daytime or nighttime physician. [email or web address] Password [facility]  06/07/2017, 12:13 PM

## 2017-06-07 NOTE — ED Triage Notes (Signed)
Patient from home with [facility] for complaint of multiple falls in the last few days with dizziness and generalized weakness. Patient was ambulating this morning with her walker when she fell due to dizziness, hitting her right ribs on handle of walker. Patient alert and oriented at this time. History of hypertension and diabetes, has not taken any medications today. 20g saline lock in left forearm from EMS.

## 2017-06-07 NOTE — ED Notes (Signed)
Placed pt on bed pan.

## 2017-06-07 NOTE — ED Notes (Signed)
Patient transported to CT 

## 2017-06-07 NOTE — ED Provider Notes (Signed)
[facility] Provider Note   CSN: [id number] Arrival date & time: 06/07/17  0805     History   Chief Complaint Chief Complaint  Patient presents with  . Dizziness    HPI Kristen Mason is a 83 y.o. female with history of hypertension, diabetes, GERD, essential tremor, peripheral neuropathy who presents following fall.  Patient reports she suddenly felt off balance and fell.  She believes she hit her right side on her walker.  She  cannot qualify how she felt prior to the fall and does not believe she felt vertigo or lightheadedness.  She did not lose consciousness.  She was noted to be hypoxic by EMS prior to arrival and was placed on nasal cannula.  Patient has not felt any more short of breath than normal recently.  She is also started coughing today.  No fevers at home.  Patient denies any pain other than her right ribs.  She denies abdominal pain, nausea, vomiting, urinary symptoms  HPI  Past Medical History:  Diagnosis Date  . Diabetes ([place])   . Essential tremor   . GERD (gastroesophageal reflux disease)   . Hypertension   . Peripheral neuropathy     Patient Active Problem List   Diagnosis Date Noted  . Acute respiratory failure with hypoxia ([place]) 06/07/2017  . Fall at home, initial encounter 06/07/2017  . Multiple rib fractures 06/07/2017  . Controlled diabetes mellitus ([place]) 06/07/2017  . Essential hypertension 06/07/2017  . Tobacco dependence 06/07/2017    Past Surgical History:  Procedure Laterality Date  . ABDOMINAL HYSTERECTOMY  1975  . BREAST LUMPECTOMY Right    non cancerous     OB History   None      Home Medications    Prior to Admission medications   Medication Sig Start Date End Date Taking? Authorizing Provider  amLODipine (NORVASC) 10 MG tablet Take 10 mg by mouth daily.   Yes [provider]  aspirin 81 MG tablet Take 81 mg by mouth daily.   Yes [provider]  carbidopa-levodopa  (SINEMET IR) 25-100 MG tablet Take 1 tablet by mouth 3 (three) times daily.   Yes [provider]  cetirizine (ZYRTEC) 10 MG tablet Take 10 mg by mouth daily.   Yes [provider]  citalopram (CELEXA) 40 MG tablet Take 40 mg by mouth daily.   Yes [provider]  clonazePAM (KLONOPIN) 1 MG tablet Take 1.5 tablets (1.5 mg total) by mouth at bedtime. Patient taking differently: Take 1 mg by mouth at bedtime.  03/25/15  Yes Tat, Eustace Quail, DO  dicyclomine (BENTYL) 10 MG capsule Take 10 mg by mouth 4 (four) times daily -  before meals and at bedtime.   Yes [provider]  diphenhydrAMINE (SOMINEX) 25 MG tablet Take 25 mg by mouth at bedtime as needed for sleep.   Yes [provider]  gabapentin (NEURONTIN) 800 MG tablet Take 800 mg by mouth 3 (three) times daily.   Yes [provider]  hydrOXYzine (ATARAX/VISTARIL) 25 MG tablet Take 25 mg by mouth at bedtime.   Yes [provider]  Lactobacillus (PROBIOTIC ACIDOPHILUS PO) Take 1 tablet by mouth daily.   Yes [provider]  lisinopril (PRINIVIL,ZESTRIL) 20 MG tablet Take 20 mg by mouth daily.   Yes [provider]  Omega-3 Fatty Acids (FISH OIL) 1000 MG CPDR Take 1,000 mg by mouth daily.   Yes [provider]  pantoprazole (PROTONIX) 40 MG tablet  Take 40 mg by mouth daily.   Yes [provider]  primidone (MYSOLINE) 250 MG tablet Take 250 mg by mouth 2 (two) times daily.   Yes [provider]  traMADol (ULTRAM) 50 MG tablet Take 50 mg by mouth every 6 (six) hours as needed.   Yes [provider]    Family History History reviewed. No pertinent family history.  Social History Social History   Tobacco Use  . Smoking status: Current Every Day Smoker    Packs/day: 1.00    Years: 62.00    Pack years: 62.00    Types: Cigarettes  . Smokeless tobacco: Current User  Substance Use Topics  . Alcohol use: No    Alcohol/week: 0.0 oz    . Drug use: No     Allergies   Statins   Review of Systems Review of Systems  Constitutional: Negative for chills and fever.  HENT: Negative for facial swelling and sore throat.   Respiratory: Positive for shortness of breath.   Cardiovascular: Negative for chest pain.  Gastrointestinal: Negative for abdominal pain, nausea and vomiting.  Genitourinary: Negative for dysuria.  Musculoskeletal: Positive for arthralgias (R rib ). Negative for back pain.  Skin: Negative for rash and wound.  Neurological: Positive for dizziness. Negative for headaches.  Psychiatric/Behavioral: The patient is not nervous/anxious.      Physical Exam Updated Vital Signs BP (!) 178/82   Pulse 73   Temp 98.2 F (36.8 C)   Resp 18   Ht 5\' 5"  (1.651 m)   Wt 70.8 kg (156 lb)   SpO2 90%   BMI 25.96 kg/m   Physical Exam  Constitutional: She appears well-developed and well-nourished. No distress.  HENT:  Head: Normocephalic and atraumatic.  Mouth/Throat: Oropharynx is clear and moist. No oropharyngeal exudate.  Eyes: Pupils are equal, round, and reactive to light. Conjunctivae are normal. Right eye exhibits no discharge. Left eye exhibits no discharge. No scleral icterus.  Neck: Normal range of motion. Neck supple. No thyromegaly present.  Cardiovascular: Normal rate, regular rhythm, normal heart sounds and intact distal pulses. Exam reveals no gallop and no friction rub.  No murmur heard. Pulmonary/Chest: Effort normal and breath sounds normal. No stridor. No respiratory distress. She has no wheezes. She has no rales.    Abdominal: Soft. Bowel sounds are normal. She exhibits no distension. There is no tenderness. There is no rebound and no guarding.  Musculoskeletal: She exhibits no edema.  Lymphadenopathy:    She has no cervical adenopathy.  Neurological: She is alert. Coordination normal.  Skin: Skin is warm and dry. No rash noted. She is not diaphoretic. No pallor.  2 cm skin tear to R  elbow  Psychiatric: She has a normal mood and affect.  Nursing note and vitals reviewed.    ED Treatments / Results  Labs (all labs ordered are listed, but only abnormal results are displayed) Labs Reviewed  COMPREHENSIVE METABOLIC PANEL - Abnormal; Notable for the following components:      Result Value   Glucose, Bld 165 (*)    Calcium 8.7 (*)    ALT 6 (*)    GFR calc non Af Amer 52 (*)    All other components within normal limits  CBC WITH DIFFERENTIAL/PLATELET - Abnormal; Notable for the following components:   MCV 102.0 (*)    RDW 15.6 (*)    All other components within normal limits  BRAIN NATRIURETIC PEPTIDE - Abnormal; Notable for the following components:   B  Natriuretic Peptide 113.6 (*)    All other components within normal limits  CBG MONITORING, ED - Abnormal; Notable for the following components:   Glucose-Capillary 154 (*)    All other components within normal limits  URINALYSIS, ROUTINE W REFLEX MICROSCOPIC  HEMOGLOBIN A1C  I-STAT CG4 LACTIC ACID, ED  I-STAT TROPONIN, ED    EKG EKG Interpretation  Date/Time:  Monday Jun 07 2017 08:11:09 EDT Ventricular Rate:  62 PR Interval:    QRS Duration: 101 QT Interval:  447 QTC Calculation: 660 R Axis:   3 Text Interpretation:  Sinus or ectopic atrial rhythm Prolonged PR interval Borderline repolarization abnormality Baseline wander in lead(s) V2 Abnormal ekg Confirmed by Carmin Muskrat [id number]) on 06/07/2017 10:15:19 AM   Radiology Dg Chest 2 View  Result Date: 06/07/2017 CLINICAL DATA:  83 year old female post fall with right greater than left chest/rib pain. Initial encounter. EXAM: CHEST - 2 VIEW COMPARISON:  05/05/2017 head CT.  02/10/2016 chest x-ray. FINDINGS: On prior exams patient was noted to have remote rib fractures with callus formation right fifth through seventh rib. Minimal cortical irregularity right sixth rib and therefore acute right sixth rib fracture cannot be excluded. Rib detail films can be  obtained for further delineation if clinically desired. No pneumothorax. PET CT detected pulmonary nodules not well delineated by plain film exam. 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 Heart size top-normal. Central pulmonary vascular prominence without pulmonary edema. Calcified aorta. No thoracic compression fracture noted. IMPRESSION: On prior exams patient was noted to have remote rib fractures with callus formation right fifth through seventh rib. Minimal cortical irregularity right sixth rib and therefore acute right sixth rib fracture cannot be excluded. Rib detail films can be obtained for further delineation if clinically desired. No pneumothorax. PET CT detected pulmonary nodules not well delineated by plain film exam. 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 Aortic Atherosclerosis (ICD10-I70.0). Electronically Signed   By: Genia Del M.D.   On: 06/07/2017 08:56   Dg Ribs Unilateral Right  Result Date: 06/07/2017 CLINICAL DATA:  Pain following fall EXAM: RIGHT RIBS - 2 VIEW COMPARISON:  Chest radiograph Jun 07, 2017 and chest CT April 22, 2017 FINDINGS: Oblique and cone-down rib images obtained. There are mildly displaced fractures of the lateral right fourth, fifth, and sixth ribs which appear recent/acute. No pneumothorax or pleural effusion evident on the right. No other rib lesions evident. Right-sided thyroid enlargement with displacement of the upper thoracic trachea toward the left again noted. IMPRESSION: Acute fractures with mild displacement of the lateral right fourth, fifth, and sixth ribs. No evident pneumothorax or pleural effusion on the right. Right lung clear. Enlarged thyroid on the right, documented previously. Electronically Signed   By: Lowella Grip III M.D.   On: 06/07/2017 10:48   Ct Head Wo Contrast  Result Date: 06/07/2017 CLINICAL DATA:  Intermittent vertigo. EXAM: CT HEAD WITHOUT CONTRAST  TECHNIQUE: Contiguous axial images were obtained from the base of the skull through the vertex without intravenous contrast. COMPARISON:  None. FINDINGS: Brain: No evidence of acute infarction, hemorrhage, hydrocephalus, extra-axial collection or mass lesion/mass effect. Mild age related cerebral atrophy. Scattered periventricular and subcortical white matter hypodensities are nonspecific, but favored to reflect chronic microvascular ischemic changes. Vascular: Atherosclerotic vascular calcification of the carotid siphons. No hyperdense vessel. Skull: Negative for fracture or focal lesion. Sinuses/Orbits: No acute finding. Other: None. IMPRESSION: 1.  No acute intracranial abnormality. 2. Mild age related cerebral atrophy and  chronic microvascular ischemic changes. Electronically Signed   By: Titus Dubin M.D.   On: 06/07/2017 09:26    Procedures Procedures (including critical care time)  Medications Ordered in ED Medications  insulin aspart (novoLOG) injection 0-15 Units (has no administration in time range)  insulin aspart (novoLOG) injection 0-5 Units (has no administration in time range)  iopamidol (ISOVUE-370) 76 % injection (has no administration in time range)  ipratropium-albuterol (DUONEB) 0.5-2.5 (3) MG/3ML nebulizer solution 3 mL (3 mLs Nebulization Given 06/07/17 0909)     Initial Impression / Assessment and Plan / ED Course  I have reviewed the triage vital signs and the nursing notes.  Pertinent labs & imaging results that were available during my care of the patient were reviewed by me and considered in my medical decision making (see chart for details).     Patient  presenting following a fall.  She had some associated shortness of breath and new oxygen requirement.  She has 3 rib fractures on the right, fourth, fifth, and sixth.  No pneumothorax or pleural effusion.  CT head is negative.  Patient with small skin tear on the right elbow which was dressed with Mepitel and gauze  roll.  CBC, CMP unremarkable.  BNP from 13.6.  Lactic negative.  Considering oxygen requirement and frequent falls, will admit to medicine for further evaluation.  I discussed patient case with Dr. Lorin Mercy with [facility] who will admit the patient.  I appreciate her assistance with the patient.  Patient also evaluated by Dr. Vanita Panda who guided the patient's management and agrees with plan.  Final Clinical Impressions(s) / ED Diagnoses   Final diagnoses:  Rib pain on right side  Hypoxia  Closed fracture of multiple ribs of right side, initial encounter    ED Discharge Orders    None       Frederica Kuster, PA-C 06/07/17 1312    Carmin Muskrat, MD 06/07/17 1537

## 2017-06-07 NOTE — ED Notes (Signed)
Pt taken to xray 

## 2017-06-08 ENCOUNTER — Other Ambulatory Visit (HOSPITAL_COMMUNITY): Payer: BLUE CROSS/BLUE SHIELD

## 2017-06-08 ENCOUNTER — Other Ambulatory Visit: Payer: Self-pay

## 2017-06-08 ENCOUNTER — Encounter (HOSPITAL_COMMUNITY): Payer: Self-pay | Admitting: General Practice

## 2017-06-08 DIAGNOSIS — F17210 Nicotine dependence, cigarettes, uncomplicated: Secondary | ICD-10-CM | POA: Diagnosis present

## 2017-06-08 DIAGNOSIS — Y92129 Unspecified place in nursing home as the place of occurrence of the external cause: Secondary | ICD-10-CM | POA: Diagnosis not present

## 2017-06-08 DIAGNOSIS — G629 Polyneuropathy, unspecified: Secondary | ICD-10-CM | POA: Diagnosis not present

## 2017-06-08 DIAGNOSIS — I10 Essential (primary) hypertension: Secondary | ICD-10-CM

## 2017-06-08 DIAGNOSIS — R0902 Hypoxemia: Secondary | ICD-10-CM | POA: Diagnosis not present

## 2017-06-08 DIAGNOSIS — J441 Chronic obstructive pulmonary disease with (acute) exacerbation: Secondary | ICD-10-CM

## 2017-06-08 DIAGNOSIS — Z9181 History of falling: Secondary | ICD-10-CM | POA: Diagnosis not present

## 2017-06-08 DIAGNOSIS — F329 Major depressive disorder, single episode, unspecified: Secondary | ICD-10-CM | POA: Diagnosis present

## 2017-06-08 DIAGNOSIS — Y92009 Unspecified place in unspecified non-institutional (private) residence as the place of occurrence of the external cause: Secondary | ICD-10-CM

## 2017-06-08 DIAGNOSIS — R2689 Other abnormalities of gait and mobility: Secondary | ICD-10-CM | POA: Diagnosis not present

## 2017-06-08 DIAGNOSIS — F17200 Nicotine dependence, unspecified, uncomplicated: Secondary | ICD-10-CM | POA: Diagnosis not present

## 2017-06-08 DIAGNOSIS — G250 Essential tremor: Secondary | ICD-10-CM | POA: Diagnosis present

## 2017-06-08 DIAGNOSIS — S2241XD Multiple fractures of ribs, right side, subsequent encounter for fracture with routine healing: Secondary | ICD-10-CM | POA: Diagnosis not present

## 2017-06-08 DIAGNOSIS — K219 Gastro-esophageal reflux disease without esophagitis: Secondary | ICD-10-CM | POA: Diagnosis present

## 2017-06-08 DIAGNOSIS — Z66 Do not resuscitate: Secondary | ICD-10-CM | POA: Diagnosis present

## 2017-06-08 DIAGNOSIS — R296 Repeated falls: Secondary | ICD-10-CM | POA: Diagnosis present

## 2017-06-08 DIAGNOSIS — E1142 Type 2 diabetes mellitus with diabetic polyneuropathy: Secondary | ICD-10-CM | POA: Diagnosis present

## 2017-06-08 DIAGNOSIS — J9601 Acute respiratory failure with hypoxia: Secondary | ICD-10-CM | POA: Diagnosis present

## 2017-06-08 DIAGNOSIS — R0781 Pleurodynia: Secondary | ICD-10-CM | POA: Diagnosis not present

## 2017-06-08 DIAGNOSIS — M6281 Muscle weakness (generalized): Secondary | ICD-10-CM | POA: Diagnosis not present

## 2017-06-08 DIAGNOSIS — W01198A Fall on same level from slipping, tripping and stumbling with subsequent striking against other object, initial encounter: Secondary | ICD-10-CM | POA: Diagnosis present

## 2017-06-08 DIAGNOSIS — W19XXXA Unspecified fall, initial encounter: Secondary | ICD-10-CM | POA: Diagnosis not present

## 2017-06-08 DIAGNOSIS — E119 Type 2 diabetes mellitus without complications: Secondary | ICD-10-CM | POA: Diagnosis not present

## 2017-06-08 DIAGNOSIS — S2241XA Multiple fractures of ribs, right side, initial encounter for closed fracture: Principal | ICD-10-CM

## 2017-06-08 DIAGNOSIS — F419 Anxiety disorder, unspecified: Secondary | ICD-10-CM | POA: Diagnosis present

## 2017-06-08 DIAGNOSIS — Z79899 Other long term (current) drug therapy: Secondary | ICD-10-CM | POA: Diagnosis not present

## 2017-06-08 LAB — BASIC METABOLIC PANEL
Anion gap: 10 (ref 5–15)
BUN: 19 mg/dL (ref 6–20)
CALCIUM: 8.4 mg/dL — AB (ref 8.9–10.3)
CO2: 28 mmol/L (ref 22–32)
Chloride: 98 mmol/L — ABNORMAL LOW (ref 101–111)
Creatinine, Ser: 0.96 mg/dL (ref 0.44–1.00)
GFR calc Af Amer: >60 mL/min (ref >60)
GFR, EST NON AFRICAN AMERICAN: 54 mL/min — AB (ref >60)
GLUCOSE: 145 mg/dL — AB (ref 65–99)
Potassium: 4.3 mmol/L (ref 3.5–5.1)
Sodium: 136 mmol/L (ref 135–145)

## 2017-06-08 LAB — GLUCOSE, CAPILLARY
GLUCOSE-CAPILLARY: 129 mg/dL — AB (ref 65–99)
GLUCOSE-CAPILLARY: 166 mg/dL — AB (ref 65–99)
GLUCOSE-CAPILLARY: 197 mg/dL — AB (ref 65–99)
Glucose-Capillary: 212 mg/dL — ABNORMAL HIGH (ref 65–99)
Glucose-Capillary: 167 mg/dL — ABNORMAL HIGH (ref 65–99)

## 2017-06-08 LAB — CBC
HEMATOCRIT: 38.1 % (ref 36.0–46.0)
Hemoglobin: 11.9 g/dL — ABNORMAL LOW (ref 12.0–15.0)
MCH: 31.7 pg (ref 26.0–34.0)
MCHC: 31.2 g/dL (ref 30.0–36.0)
MCV: 101.6 fL — ABNORMAL HIGH (ref 78.0–100.0)
Platelets: 163 10*3/uL (ref 150–400)
RBC: 3.75 MIL/uL — ABNORMAL LOW (ref 3.87–5.11)
RDW: 16 % — AB (ref 11.5–15.5)
WBC: 6.5 10*3/uL (ref 4.0–10.5)

## 2017-06-08 MED ORDER — GLUCERNA SHAKE PO LIQD
237.0000 mL | Freq: Two times a day (BID) | ORAL | Status: DC
  Administered 2017-06-08 – 2017-06-11 (×6): 237 mL via ORAL

## 2017-06-08 MED ORDER — ACETAMINOPHEN 500 MG PO TABS
1000.0000 mg | ORAL_TABLET | Freq: Three times a day (TID) | ORAL | Status: DC
  Administered 2017-06-08 – 2017-06-11 (×7): 1000 mg via ORAL
  Filled 2017-06-08 (×8): qty 2

## 2017-06-08 MED ORDER — GUAIFENESIN ER 600 MG PO TB12
600.0000 mg | ORAL_TABLET | Freq: Two times a day (BID) | ORAL | Status: DC
  Administered 2017-06-08 – 2017-06-11 (×7): 600 mg via ORAL
  Filled 2017-06-08 (×7): qty 1

## 2017-06-08 MED ORDER — IPRATROPIUM-ALBUTEROL 0.5-2.5 (3) MG/3ML IN SOLN
3.0000 mL | Freq: Four times a day (QID) | RESPIRATORY_TRACT | Status: DC
  Administered 2017-06-08 – 2017-06-10 (×7): 3 mL via RESPIRATORY_TRACT
  Filled 2017-06-08 (×7): qty 3

## 2017-06-08 MED ORDER — METHYLPREDNISOLONE SODIUM SUCC 125 MG IJ SOLR
60.0000 mg | Freq: Three times a day (TID) | INTRAMUSCULAR | Status: DC
Start: 2017-06-08 — End: 2017-06-09
  Administered 2017-06-08 – 2017-06-09 (×4): 60 mg via INTRAVENOUS
  Filled 2017-06-08 (×4): qty 2

## 2017-06-08 NOTE — Progress Notes (Signed)
Initial Nutrition Assessment  DOCUMENTATION CODES:   Not applicable  INTERVENTION:    Glucerna Shake po BID, each supplement provides 220 kcal and 10 grams of protein  NUTRITION DIAGNOSIS:   Increased nutrient needs related to chronic illness as evidenced by estimated needs  GOAL:   Patient will meet greater than or equal to 90% of their needs  MONITOR:   PO intake, Supplement acceptance, Labs, Skin, Weight trends, I & O's  REASON FOR ASSESSMENT:   Consult COPD Protocol  ASSESSMENT:   82 y.o. Female with long-standing history of tobacco use, hypertension, diabetes-admitted with a mechanical fall-found to have multiple right-sided rib fractures and acute hypoxic respiratory failure likely secondary to (undiagnosed)COPD exacerbation.  RD met with pt at bedside. Has emesis bag. She is feeling nauseous.  Pt shares she ate "a little" of her breakfast. No % PO intake available per flowsheets. She is c/o chest pain during visit. Stated "it hurts so bad". Also having tremors.  Amenable to receiving Glucerna Shake nutrition supplements. RD to order. Labs and medications reviewed. Cl 98 (L). CBG's 191-129-166. No recent unintentional weight loss reported.  NUTRITION - FOCUSED PHYSICAL EXAM:  Deferred. Pt in severe pain.  Diet Order:   Diet Order           Diet Carb Modified Fluid consistency: Thin; Room service appropriate? Yes  Diet effective now        EDUCATION NEEDS:   No education needs have been identified at this time  Skin:  Skin Assessment: Skin Integrity Issues: Skin Integrity Issues:: Other (Comment) Other: MASD to elbow  Last BM:  5/11  Height:   Ht Readings from Last 1 Encounters:  06/07/17 5' 5" (1.651 m)   Weight:   Wt Readings from Last 1 Encounters:  06/07/17 156 lb (70.8 kg)   Ideal Body Weight:  56.8 kg  BMI:  Body mass index is 25.96 kg/m.  Estimated Nutritional Needs:   Kcal:  1700-1900  Protein:  80-95 gm  Fluid:  1.7-1.9  L  Katie , RD, LDN Pager #: [phone] After-Hours Pager #: [phone]  

## 2017-06-08 NOTE — Progress Notes (Signed)
RN notified of pt pain. Family at the pts bedside

## 2017-06-08 NOTE — Evaluation (Signed)
Occupational Therapy Evaluation Patient Details Name: Kristen Mason MRN: [id number] DOB: 1934/05/28 Today's Date: 06/08/2017    History of Present Illness Kristen Mason a 83 y.o.femalewith PMH:HTN; DM; and GERD presenting with fall.She fell when ambulating with RW after getting up from bed and reported 3 other recent falls with associated dizziness. Hit RW when she fell sustaining rib fxs. She has had to have oxygen during prior hospitalizations but otherwise has no known h/o COPD or O2 dependence. She is current smoker. She denies that she is SOB but her daughter feels like she has episodic heavy breathing at home, worse over the last few weeks. Chronic cough, productive of whitish phlegm. She is not on breathing treatments.    Clinical Impression   Pt with decline in function and safety with ADLs and ADL mobility with decreased strength, endurance, balance, coordination and pain. Pt with tremors in UEs and requires assist with self feeding. Pt fearful and anxious with mobility, however is motivated to get better. Pt uses a RW at baseline and reports that she was independent with ADLs and that she is willing to go to rehab facility after acute stay to return to PLOF before d/c home. Pt would benefit from acute OT services to address impairments to maximize level of function and safety     Follow Up Recommendations  SNF;Supervision/Assistance - 24 hour    Equipment Recommendations  Other (comment)(TBD at next venue of care)    Recommendations for Other Services       Precautions / Restrictions Precautions Precautions: Fall Precaution Comments: tremors Restrictions Weight Bearing Restrictions: No      Mobility Bed Mobility               General bed mobility comments: pt up in recliner upon arrival  Transfers Overall transfer level: Needs assistance   Transfers: Sit to/from Stand;Stand Pivot Transfers Sit to Stand: Mod assist Stand pivot transfers: Mod  assist       General transfer comment: cues for hand place,emt, sequencing. Pt unsteady and fearful but motivated to try/work with therapy    Balance Overall balance assessment: Needs assistance Sitting-balance support: Feet supported;No upper extremity supported Sitting balance-Leahy Scale: Fair   Postural control: Posterior lean Standing balance support: Bilateral upper extremity supported;Single extremity supported;During functional activity Standing balance-Leahy Scale: Poor                             ADL either performed or assessed with clinical judgement   ADL Overall ADL's : Needs assistance/impaired Eating/Feeding: Minimal assistance;Sitting Eating/Feeding Details (indicate cue type and reason): tremors in UEs Grooming: Wash/dry hands;Wash/dry face;Moderate assistance;Standing   Upper Body Bathing: Moderate assistance;Sitting   Lower Body Bathing: Maximal assistance   Upper Body Dressing : Moderate assistance;Sitting   Lower Body Dressing: Total assistance   Toilet Transfer: Moderate assistance;Cueing for safety;Cueing for sequencing;RW;Stand-pivot;BSC   Toileting- Clothing Manipulation and Hygiene: Total assistance;Sit to/from stand       Functional mobility during ADLs: Moderate assistance;Cueing for safety;Cueing for sequencing;Rolling walker General ADL Comments: pt fearful and anxious, however is motivated to return to PLOF     Vision Baseline Vision/History: Wears glasses Wears Glasses: Reading only Patient Visual Report: No change from baseline       Perception     Praxis      Pertinent Vitals/Pain Pain Assessment: Faces Faces Pain Scale: Hurts even more Pain Location: ribs Pain Descriptors / Indicators: Aching;Grimacing;Guarding;Sore Pain Intervention(s): Limited activity within  patient's tolerance;Monitored during session;Premedicated before session;Repositioned     Hand Dominance Right   Extremity/Trunk Assessment Upper  Extremity Assessment Upper Extremity Assessment: Generalized weakness   Lower Extremity Assessment Lower Extremity Assessment: Defer to PT evaluation       Communication Communication Communication: No difficulties   Cognition Arousal/Alertness: Awake/alert Behavior During Therapy: WFL for tasks assessed/performed Overall Cognitive Status: No family/caregiver present to determine baseline cognitive functioning                                     General Comments       Exercises     Shoulder Instructions      Home Living Family/patient expects to be discharged to:: Skilled nursing facility Living Arrangements: Children Available Help at Discharge: Family;Available PRN/intermittently Type of Home: House       Home Layout: One level     Bathroom Shower/Tub: Tub/shower unit;Walk-in shower   Bathroom Toilet: Standard     Home Equipment: Environmental consultant - 2 wheels          Prior Functioning/Environment Level of Independence: Independent with assistive device(s)  Gait / Transfers Assistance Needed: used RW for mobility ADL's / Homemaking Assistance Needed: pt reports that she was independent with ADLs            OT Problem List: Decreased activity tolerance;Decreased strength;Decreased knowledge of use of DME or AE;Pain;Decreased knowledge of precautions;Decreased coordination;Impaired balance (sitting and/or standing)      OT Treatment/Interventions: Self-care/ADL training;DME and/or AE instruction;Therapeutic activities;Balance training;Therapeutic exercise;Neuromuscular education;Patient/family education    OT Goals(Current goals can be found in the care plan section) Acute Rehab OT Goals Patient Stated Goal: go to rehab to get well and go home OT Goal Formulation: With patient Time For Goal Achievement: 06/22/17 Potential to Achieve Goals: Good ADL Goals Pt Will Perform Grooming: with min assist;standing Pt Will Perform Upper Body Bathing: with  min assist;sitting Pt Will Perform Lower Body Bathing: with mod assist;sitting/lateral leans;sit to/from stand Pt Will Perform Upper Body Dressing: with min assist;sitting Pt Will Transfer to Toilet: with min assist;ambulating;bedside commode Pt Will Perform Toileting - Clothing Manipulation and hygiene: with mod assist  OT Frequency: Min 2X/week   Barriers to D/C: Decreased caregiver support          Co-evaluation              AM-PAC PT "6 Clicks" Daily Activity     Outcome Measure Help from another person eating meals?: A Little Help from another person taking care of personal grooming?: A Lot Help from another person toileting, which includes using toliet, bedpan, or urinal?: Total Help from another person bathing (including washing, rinsing, drying)?: Total Help from another person to put on and taking off regular upper body clothing?: A Lot Help from another person to put on and taking off regular lower body clothing?: Total 6 Click Score: 10   End of Session Equipment Utilized During Treatment: Gait belt;Rolling walker;Other (comment)(BSC)  Activity Tolerance: Patient limited by fatigue;Patient limited by pain Patient left: in chair;with call bell/phone within reach;with chair alarm set  OT Visit Diagnosis: Unsteadiness on feet (R26.81);History of falling (Z91.81);Other abnormalities of gait and mobility (R26.89);Pain;Muscle weakness (generalized) (M62.81) Pain - Right/Left: Right Pain - part of body: (ribs)                Time: 1448-1856 OT Time Calculation (min): 24 min Charges:  OT General Charges $  OT Visit: 1 Visit OT Evaluation $OT Eval Moderate Complexity: 1 Mod OT Treatments $Therapeutic Activity: 8-22 mins G-Codes: OT G-codes **NOT FOR INPATIENT CLASS** Functional Assessment Tool Used: AM-PAC 6 Clicks Daily Activity     Britt Bottom 06/08/2017, 2:32 PM

## 2017-06-08 NOTE — Progress Notes (Signed)
PROGRESS NOTE        PATIENT DETAILS Name: Kristen Mason Age: 83 y.o. Sex: female Date of Birth: 06-15-1934 Admit Date: 06/07/2017 Admitting Physician Karmen Bongo, MD [id number], Denton Ar, MD  Brief Narrative: Patient is a 83 y.o. female with long-standing history of tobacco use, hypertension, diabetes-admitted with a mechanical fall-found to have multiple right-sided rib fractures and acute hypoxic respiratory failure likely secondary to (undiagnosed)COPD exacerbation.  See below for further details  Subjective: Continues to have significant amount of pleuritic chest pain.  Still requiring 2-3 L of oxygen to maintain O2 sats above 90%.  Still wheezing.  Assessment/Plan: Acute hypoxic respiratory failure likely secondary to COPD with exacerbation: Does not have a formal diagnosis of COPD-but given long-standing history of tobacco use-coarse wheezing on exam and highly suspicious for COPD exacerbation.  Still wheezing-still requiring 2-3 L of oxygen to maintain O2 saturations above 90%-continue IV steroids, bronchodilators, add Mucinex-encourage incentive spirometry.  Note-CT angiogram was negative for pulmonary embolism.  Formal PFTs in the outpatient setting.  Right-sided rib fracture: Secondary to mechanical fall-continue supportive care-encourage incentive spirometry-continue with as needed tramadol and narcotics.  Frequent falls: Await PT evaluation-May need short-term SNF on discharge.  DM-2: CBG stable-continue SSI  Hypertension: Blood pressure controlled-continue with amlodipine and lisinopril.  Follow and adjust accordingly  Tremors: Not sure if patient has Parkinson's disease-she seems to be maintained on Sinemet and primidone-she could have autonomic dysfunction related to Parkinson disease that could be contributing to frequent falls.  Peripheral neuropathy: Continue Neurontin  Depression/anxiety: Appears to be stable-although slightly anxious due  to pain-continue with Klonopin and Celexa.  DVT Prophylaxis: Prophylactic Lovenox   Code Status: DNR  Family Communication: Daughter at bedside  Disposition Plan: Remain inpatient-but will plan on Home health vs SNF on discharge-probably over the next couple of days.  Antimicrobial agents: Anti-infectives (From admission, onward)   None      Procedures: None  CONSULTS:  None  Time spent: 25- minutes-Greater than 50% of this time was spent in counseling, explanation of diagnosis, planning of further management, and coordination of care.  MEDICATIONS: Scheduled Meds: . acetaminophen  1,000 mg Oral Q8H  . amLODipine  10 mg Oral Daily  . aspirin EC  81 mg Oral Daily  . carbidopa-levodopa  1 tablet Oral TID  . citalopram  40 mg Oral Daily  . clonazePAM  1 mg Oral QHS  . dicyclomine  10 mg Oral TID AC & HS  . docusate sodium  100 mg Oral BID  . enoxaparin (LOVENOX) injection  40 mg Subcutaneous Q24H  . gabapentin  800 mg Oral TID  . guaiFENesin  600 mg Oral BID  . hydrOXYzine  25 mg Oral QHS  . insulin aspart  0-15 Units Subcutaneous TID WC  . insulin aspart  0-5 Units Subcutaneous QHS  . ipratropium-albuterol  3 mL Nebulization Q6H  . lidocaine  1 patch Transdermal Q24H  . lisinopril  20 mg Oral Daily  . loratadine  10 mg Oral Daily  . methylPREDNISolone (SOLU-MEDROL) injection  60 mg Intravenous Q8H  . nicotine  14 mg Transdermal Daily  . pantoprazole  40 mg Oral Daily  . primidone  250 mg Oral BID   Continuous Infusions: PRN Meds:.albuterol, hydrALAZINE, morphine injection, ondansetron **OR** ondansetron (ZOFRAN) IV, traMADol   PHYSICAL EXAM: Vital signs: Vitals:   06/08/17 0100 06/08/17 0518  06/08/17 0523 06/08/17 0855  BP: (!) 138/55 (!) 143/57 (!) 143/57   Pulse: (!) 56 (!) 59 (!) 59   Resp: 20 18 18    Temp: 98.4 F (36.9 C) 98 F (36.7 C) 98 F (36.7 C)   TempSrc: Oral Oral Oral   SpO2:  97% 91% 97%  Weight:      Height:       Filed Weights     06/07/17 0811  Weight: 70.8 kg (156 lb)   Body mass index is 25.96 kg/m.   General appearance :Awake, alert, not in any distress.  HEENT: Atraumatic and Normocephalic Neck: supple, no JVD. No cervical lymphadenopathy. No thyromegaly Resp:Good air entry bilaterally, coarse rhonchi all over CVS: S1 S2 regular, no murmurs.  GI: Bowel sounds present, Non tender and not distended with no gaurding, rigidity or rebound.No organomegaly Extremities: B/L Lower Ext shows no edema, both legs are warm to touch Neurology:  speech clear,Non focal, sensation is grossly intact. Musculoskeletal:No digital cyanosis Skin:No Rash, warm and dry Wounds:N/A  I have personally reviewed following labs and imaging studies  LABORATORY DATA: CBC: Recent Labs  Lab 06/07/17 0826 06/08/17 0410  WBC 4.7 6.5  NEUTROABS 3.3  --   HGB 12.4 11.9*  HCT 39.9 38.1  MCV 102.0* 101.6*  PLT 158 242    Basic Metabolic Panel: Recent Labs  Lab 06/07/17 0826 06/08/17 0410  NA 139 136  K 4.5 4.3  CL 104 98*  CO2 27 28  GLUCOSE 165* 145*  BUN 14 19  CREATININE 1.00 0.96  CALCIUM 8.7* 8.4*    GFR: Estimated Creatinine Clearance: 46.1 mL/min (by C-G formula based on SCr of 0.96 mg/dL).  Liver Function Tests: Recent Labs  Lab 06/07/17 0826  AST 17  ALT 6*  ALKPHOS 114  BILITOT 0.5  PROT 7.2  ALBUMIN 3.6   No results for input(s): LIPASE, AMYLASE in the last 168 hours. No results for input(s): AMMONIA in the last 168 hours.  Coagulation Profile: No results for input(s): INR, PROTIME in the last 168 hours.  Cardiac Enzymes: No results for input(s): CKTOTAL, CKMB, CKMBINDEX, TROPONINI in the last 168 hours.  BNP (last 3 results) No results for input(s): PROBNP in the last 8760 hours.  HbA1C: Recent Labs    06/07/17 0826  HGBA1C 6.6*    CBG: Recent Labs  Lab 06/07/17 0929 06/07/17 1650 06/07/17 2119 06/08/17 0802 06/08/17 1214  GLUCAP 154* 189* 191* 129* 166*    Lipid  Profile: No results for input(s): CHOL, HDL, LDLCALC, TRIG, CHOLHDL, LDLDIRECT in the last 72 hours.  Thyroid Function Tests: No results for input(s): TSH, T4TOTAL, FREET4, T3FREE, THYROIDAB in the last 72 hours.  Anemia Panel: No results for input(s): VITAMINB12, FOLATE, FERRITIN, TIBC, IRON, RETICCTPCT in the last 72 hours.  Urine analysis:    Component Value Date/Time   COLORURINE AMBER (A) 06/07/2017 1455   APPEARANCEUR CLEAR 06/07/2017 1455   LABSPEC 1.013 06/07/2017 1455   PHURINE 6.0 06/07/2017 1455   GLUCOSEU NEGATIVE 06/07/2017 1455   HGBUR NEGATIVE 06/07/2017 1455   BILIRUBINUR NEGATIVE 06/07/2017 1455   KETONESUR NEGATIVE 06/07/2017 1455   PROTEINUR 30 (A) 06/07/2017 1455   NITRITE POSITIVE (A) 06/07/2017 1455   LEUKOCYTESUR NEGATIVE 06/07/2017 1455    Sepsis Labs: Lactic Acid, Venous    Component Value Date/Time   LATICACIDVEN 0.96 06/07/2017 0841    MICROBIOLOGY: No results found for this or any previous visit (from the past 240 hour(s)).  RADIOLOGY STUDIES/RESULTS: Dg Chest  2 View  Result Date: 06/07/2017 CLINICAL DATA:  83 year old female post fall with right greater than left chest/rib pain. Initial encounter. EXAM: CHEST - 2 VIEW COMPARISON:  05/05/2017 head CT.  02/10/2016 chest x-ray. FINDINGS: On prior exams patient was noted to have remote rib fractures with callus formation right fifth through seventh rib. Minimal cortical irregularity right sixth rib and therefore acute right sixth rib fracture cannot be excluded. Rib detail films can be obtained for further delineation if clinically desired. No pneumothorax. PET CT detected pulmonary nodules not well delineated by plain film exam. 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 Heart size top-normal. Central pulmonary vascular prominence without pulmonary edema. Calcified aorta. No thoracic compression fracture noted. IMPRESSION: On prior exams patient was noted to have remote  rib fractures with callus formation right fifth through seventh rib. Minimal cortical irregularity right sixth rib and therefore acute right sixth rib fracture cannot be excluded. Rib detail films can be obtained for further delineation if clinically desired. No pneumothorax. PET CT detected pulmonary nodules not well delineated by plain film exam. 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 Aortic Atherosclerosis (ICD10-I70.0). Electronically Signed   By: Genia Del M.D.   On: 06/07/2017 08:56   Dg Ribs Unilateral Right  Result Date: 06/07/2017 CLINICAL DATA:  Pain following fall EXAM: RIGHT RIBS - 2 VIEW COMPARISON:  Chest radiograph Jun 07, 2017 and chest CT April 22, 2017 FINDINGS: Oblique and cone-down rib images obtained. There are mildly displaced fractures of the lateral right fourth, fifth, and sixth ribs which appear recent/acute. No pneumothorax or pleural effusion evident on the right. No other rib lesions evident. Right-sided thyroid enlargement with displacement of the upper thoracic trachea toward the left again noted. IMPRESSION: Acute fractures with mild displacement of the lateral right fourth, fifth, and sixth ribs. No evident pneumothorax or pleural effusion on the right. Right lung clear. Enlarged thyroid on the right, documented previously. Electronically Signed   By: Lowella Grip III M.D.   On: 06/07/2017 10:48   Ct Head Wo Contrast  Result Date: 06/07/2017 CLINICAL DATA:  Intermittent vertigo. EXAM: CT HEAD WITHOUT CONTRAST TECHNIQUE: Contiguous axial images were obtained from the base of the skull through the vertex without intravenous contrast. COMPARISON:  None. FINDINGS: Brain: No evidence of acute infarction, hemorrhage, hydrocephalus, extra-axial collection or mass lesion/mass effect. Mild age related cerebral atrophy. Scattered periventricular and subcortical white matter hypodensities are nonspecific, but favored to reflect chronic  microvascular ischemic changes. Vascular: Atherosclerotic vascular calcification of the carotid siphons. No hyperdense vessel. Skull: Negative for fracture or focal lesion. Sinuses/Orbits: No acute finding. Other: None. IMPRESSION: 1.  No acute intracranial abnormality. 2. Mild age related cerebral atrophy and chronic microvascular ischemic changes. Electronically Signed   By: Titus Dubin M.D.   On: 06/07/2017 09:26   Ct Angio Chest Pe W And/or Wo Contrast  Result Date: 06/07/2017 CLINICAL DATA:  Shortness of breath.  Hypoxia and dyspnea EXAM: CT ANGIOGRAPHY CHEST WITH CONTRAST TECHNIQUE: Multidetector CT imaging of the chest was performed using the standard protocol during bolus administration of intravenous contrast. Multiplanar CT image reconstructions and MIPs were obtained to evaluate the vascular anatomy. CONTRAST:  100 cc Isovue 370 intravenous COMPARISON:  04/22/2017 and PET-CT 05/05/2017 FINDINGS: Cardiovascular:  Cardiomegaly without pericardial effusion. Mediastinum/Nodes: Multinodular goiter with tracheal deviation to the left, known. Negative for adenopathy Lungs/Pleura: Frothy appearing opacification of left lower lobe lobar and segmental airways. Similar appearance was seen on the  most recent prior. Mild atelectasis at the bases. Calcified pulmonary nodule in the right lower lobe. 12 mm noncalcified pulmonary nodule in the left upper lobe, evaluated by PET CT last month. Left upper lobe ground-glass nodule is not discretely visualized today Upper Abdomen: Cholelithiasis.  Colonic diverticulosis. Musculoskeletal: Lateral right fourth, fifth, sixth, and seventh ribs fractures without visible callus. A posterior right sixth rib is also fractured. Advanced bilateral glenohumeral osteoarthritis. Review of the MIP images confirms the above findings. IMPRESSION: 1. Acute lateral right fourth through seventh rib fractures. There is also a posterior right sixth rib fracture. 2. Left lower lobe airway  debris with atelectasis. There is a background of generalized airway thickening. 3. Negative for pulmonary embolism. 4. Multinodular goiter. 5. 12 mm left upper lobe pulmonary nodule evaluated by PET CT last month. 6. Aortic Atherosclerosis (ICD10-I70.0). 7. Cholelithiasis. Electronically Signed   By: Monte Fantasia M.D.   On: 06/07/2017 14:56     LOS: 0 days   Oren Binet, MD  [facility] Pager:[phone]  If 7PM-7AM, please contact night-coverage [email or web address] Password [facility] 06/08/2017, 12:41 PM

## 2017-06-08 NOTE — Evaluation (Signed)
Physical Therapy Evaluation Patient Details Name: Kristen Mason MRN: [id number] DOB: 1934/03/19 Today's Date: 06/08/2017   History of Present Illness  Sathvika Pippinis a 83 y.o.femalewith PMH:HTN; DM; and GERD presenting with fall.She fell when ambulating with RW after getting up from bed and reported 3 other recent falls with associated dizziness. Hit RW when she fell sustaining rib fxs. She has had to have oxygen during prior hospitalizations but otherwise has no known h/o COPD or O2 dependence. She is current smoker. She denies that she is SOB but her daughter feels like she has episodic heavy breathing at home, worse over the last few weeks. Chronic cough, productive of whitish phlegm. She is not on breathing treatments.   Clinical Impression  Pt admitted with above diagnosis. Pt currently with functional limitations due to the deficits listed below (see PT Problem List). Pt anxious with mobility but did walk 8' with RW and min A. Practiced sit to stand with hands in several positions to try to decrease rib pain but at this point, all mobility hurts her. Tremor decreased safety with mobility.  Pt will benefit from skilled PT to increase their independence and safety with mobility to allow discharge to the venue listed below.       Follow Up Recommendations SNF    Equipment Recommendations  None recommended by PT    Recommendations for Other Services       Precautions / Restrictions Precautions Precautions: Fall Precaution Comments: tremors Restrictions Weight Bearing Restrictions: No      Mobility  Bed Mobility Overal bed mobility: Needs Assistance Bed Mobility: Sit to Supine       Sit to supine: Min assist   General bed mobility comments: min A to LE's for lifting against gravity into the bed. All mvmt painful to ribs. Mod A to position self in bed  Transfers Overall transfer level: Needs assistance Equipment used: Rolling walker (2 wheeled) Transfers: Sit  to/from Stand Sit to Stand: Min assist Stand pivot transfers: Mod assist       General transfer comment: stood from bed and BSC with min A for anterior translation and power up. Also practiced sit to stand with pt's hands on her knees to see if it was less painful to ribs but it was about the same.   Ambulation/Gait Ambulation/Gait assistance: Min assist Ambulation Distance (Feet): 8 Feet Assistive device: Rolling walker (2 wheeled) Gait Pattern/deviations: Decreased stride length;Antalgic;Step-through pattern Gait velocity: decreased Gait velocity interpretation: <1.31 ft/sec, indicative of household ambulator General Gait Details: very short stride, trunk flexed, pt fearful of falling  Stairs            Wheelchair Mobility    Modified Rankin (Stroke Patients Only)       Balance Overall balance assessment: Needs assistance Sitting-balance support: Feet supported;No upper extremity supported Sitting balance-Leahy Scale: Fair   Postural control: Posterior lean Standing balance support: Bilateral upper extremity supported;Single extremity supported;During functional activity Standing balance-Leahy Scale: Poor                               Pertinent Vitals/Pain Pain Assessment: 0-10 Pain Score: 10-Worst pain ever Faces Pain Scale: Hurts even more Pain Location: ribs Pain Descriptors / Indicators: Aching;Grimacing;Guarding;Sore Pain Intervention(s): Limited activity within patient's tolerance;Monitored during session;RN gave pain meds during session    [place] expects to be discharged to:: Skilled nursing facility Living Arrangements: Children Available Help at Discharge: Family;Available PRN/intermittently Type of  Home: House       Home Layout: One level Home Equipment: Environmental consultant - 2 wheels      Prior Function Level of Independence: Independent with assistive device(s)   Gait / Transfers Assistance Needed: used RW for  mobility  ADL's / Homemaking Assistance Needed: pt reports that she was independent with ADLs  Comments: lives with daughter but agreeable to SNF for rehab     Hand Dominance   Dominant Hand: Right    Extremity/Trunk Assessment   Upper Extremity Assessment Upper Extremity Assessment: Defer to OT evaluation    Lower Extremity Assessment Lower Extremity Assessment: Generalized weakness    Cervical / Trunk Assessment Cervical / Trunk Assessment: Kyphotic  Communication   Communication: No difficulties(tremot noted in voice as well)  Cognition Arousal/Alertness: Awake/alert Behavior During Therapy: WFL for tasks assessed/performed Overall Cognitive Status: History of cognitive impairments - at baseline                                        General Comments General comments (skin integrity, edema, etc.): pt reports that she did not sleep all night, hopeful to take a nap once back in bed    Exercises General Exercises - Lower Extremity Ankle Circles/Pumps: AROM;Both;20 reps;Seated Long Arc Quad: AROM;Both;10 reps;Seated Hip Flexion/Marching: AROM;Both;10 reps;Seated   Assessment/Plan    PT Assessment Patient needs continued PT services  PT Problem List Decreased strength;Decreased activity tolerance;Decreased balance;Decreased mobility;Decreased knowledge of use of DME;Decreased knowledge of precautions;Pain       PT Treatment Interventions DME instruction;Gait training;Functional mobility training;Therapeutic activities;Therapeutic exercise;Balance training;Patient/family education    PT Goals (Current goals can be found in the Care Plan section)  Acute Rehab PT Goals Patient Stated Goal: go to rehab to get well and go home PT Goal Formulation: With patient Time For Goal Achievement: 06/22/17 Potential to Achieve Goals: Good    Frequency Min 2X/week   Barriers to discharge Decreased caregiver support daughter works, will need post acute rehab     Co-evaluation               AM-PAC PT "6 Clicks" Daily Activity  Outcome Measure Difficulty turning over in bed (including adjusting bedclothes, sheets and blankets)?: Unable Difficulty moving from lying on back to sitting on the side of the bed? : Unable Difficulty sitting down on and standing up from a chair with arms (e.g., wheelchair, bedside commode, etc,.)?: Unable Help needed moving to and from a bed to chair (including a wheelchair)?: A Lot Help needed walking in hospital room?: A Lot Help needed climbing 3-5 steps with a railing? : Total 6 Click Score: 8    End of Session Equipment Utilized During Treatment: Gait belt Activity Tolerance: Patient tolerated treatment well Patient left: in bed;with call bell/phone within reach;with family/visitor present Nurse Communication: Mobility status PT Visit Diagnosis: Unsteadiness on feet (R26.81);Repeated falls (R29.6);Muscle weakness (generalized) (M62.81);Difficulty in walking, not elsewhere classified (R26.2)    Time: 1427-1500 PT Time Calculation (min) (ACUTE ONLY): 33 min   Charges:   PT Evaluation $PT Eval Moderate Complexity: 1 Mod PT Treatments $Gait Training: 8-22 mins   PT G Codes:        Leighton Roach, PT  Acute Rehab Services  [place] 06/08/2017, 3:30 PM

## 2017-06-09 ENCOUNTER — Inpatient Hospital Stay (HOSPITAL_COMMUNITY): Payer: Medicare Other

## 2017-06-09 DIAGNOSIS — J9601 Acute respiratory failure with hypoxia: Secondary | ICD-10-CM

## 2017-06-09 LAB — ECHOCARDIOGRAM COMPLETE
Height: 65 in
WEIGHTICAEL: 2496 [oz_av]

## 2017-06-09 LAB — GLUCOSE, CAPILLARY
GLUCOSE-CAPILLARY: 159 mg/dL — AB (ref 65–99)
GLUCOSE-CAPILLARY: 186 mg/dL — AB (ref 65–99)
Glucose-Capillary: 189 mg/dL — ABNORMAL HIGH (ref 65–99)
Glucose-Capillary: 195 mg/dL — ABNORMAL HIGH (ref 65–99)

## 2017-06-09 MED ORDER — ADULT MULTIVITAMIN W/MINERALS CH
1.0000 | ORAL_TABLET | Freq: Every day | ORAL | Status: DC
  Administered 2017-06-10 – 2017-06-11 (×2): 1 via ORAL
  Filled 2017-06-09 (×2): qty 1

## 2017-06-09 MED ORDER — METHYLPREDNISOLONE SODIUM SUCC 40 MG IJ SOLR
40.0000 mg | Freq: Three times a day (TID) | INTRAMUSCULAR | Status: DC
  Administered 2017-06-09 – 2017-06-11 (×6): 40 mg via INTRAVENOUS
  Filled 2017-06-09 (×6): qty 1

## 2017-06-09 NOTE — NC FL2 (Addendum)
[place] LEVEL OF CARE SCREENING TOOL     IDENTIFICATION  Patient Name: Kristen Mason Birthdate: 1934/03/24 Sex: female Admission Date (Current Location): 06/07/2017  [facility] and [place] Number:  Herbalist and Address:  The [place], [place] [address], [place], [place] [address]      Provider Number: [id number]  Attending Physician Name and Address:  Jonetta Osgood, MD  Relative Name and Phone Number:   Alysia Penna [phone]    Current Level of Care: Hospital Recommended Level of Care: [place] Prior Approval Number:    Date Approved/Denied:   PASRR Number: [id number]  Discharge Plan: SNF    Current Diagnoses: Patient Active Problem List   Diagnosis Date Noted  . Acute respiratory failure with hypoxia ([place]) 06/07/2017  . Fall at home, initial encounter 06/07/2017  . Multiple rib fractures 06/07/2017  . Controlled diabetes mellitus ([place]) 06/07/2017  . Essential hypertension 06/07/2017  . Tobacco dependence 06/07/2017    Orientation RESPIRATION BLADDER Height & Weight     Self, Time, Situation, Place  O2(2L nasal canula) Continent, External catheter Weight: 156 lb (70.8 kg) Height:  5\' 5"  (165.1 cm)  BEHAVIORAL SYMPTOMS/MOOD NEUROLOGICAL BOWEL NUTRITION STATUS      Continent Diet(see discharge summary)  AMBULATORY STATUS COMMUNICATION OF NEEDS Skin   Extensive Assist Verbally Skin abrasions(r elbow abrasion with foam)                       Personal Care Assistance Level of Assistance  Bathing, Feeding, Dressing Bathing Assistance: Maximum assistance Feeding assistance: Independent Dressing Assistance: Maximum assistance     Functional Limitations Info  Sight, Hearing, Speech Sight Info: Adequate(reading glasses) Hearing Info: Adequate Speech Info: Adequate    SPECIAL CARE FACTORS FREQUENCY  PT (By licensed PT), OT (By licensed OT)     PT Frequency: 5x week OT Frequency:  5x week            Contractures Contractures Info: Not present    Additional Factors Info  Code Status, Allergies  Psychotropic, Insulin Sliding Scale Code Status Info: DNR Allergies Info: Statins  Psychotrophic: celexa tablet 40mg  PO daily; klonopin tablet 1 mg PO at bedtime Insulin Sliding Scale: novolog injection 0-15 units 3x daily; novolog injection 0-5units daily at bedtime         Current Medications (06/09/2017):  This is the current hospital active medication list Current Facility-Administered Medications  Medication Dose Route Frequency Provider Last Rate Last Dose  . acetaminophen (TYLENOL) tablet 1,000 mg  1,000 mg Oral Q8H Ghimire, Henreitta Leber, MD   1,000 mg at 06/08/17 2216  . albuterol (PROVENTIL) (2.5 MG/3ML) 0.083% nebulizer solution 2.5 mg  2.5 mg Nebulization Q1H PRN Karmen Bongo, MD      . amLODipine (NORVASC) tablet 10 mg  10 mg Oral Daily Karmen Bongo, MD   10 mg at 06/09/17 1000  . aspirin EC tablet 81 mg  81 mg Oral Daily Karmen Bongo, MD   81 mg at 06/09/17 1001  . carbidopa-levodopa (SINEMET IR) 25-100 MG per tablet immediate release 1 tablet  1 tablet Oral TID Karmen Bongo, MD   1 tablet at 06/09/17 1001  . citalopram (CELEXA) tablet 40 mg  40 mg Oral Daily Karmen Bongo, MD   40 mg at 06/09/17 1001  . clonazePAM (KLONOPIN) tablet 1 mg  1 mg Oral QHS Karmen Bongo, MD   1 mg at 06/08/17 2217  . dicyclomine (BENTYL) capsule  10 mg  10 mg Oral TID AC & HS Karmen Bongo, MD   10 mg at 06/09/17 3557  . docusate sodium (COLACE) capsule 100 mg  100 mg Oral BID Karmen Bongo, MD   100 mg at 06/09/17 1001  . enoxaparin (LOVENOX) injection 40 mg  40 mg Subcutaneous Q24H Karmen Bongo, MD   40 mg at 06/08/17 1930  . feeding supplement (GLUCERNA SHAKE) (GLUCERNA SHAKE) liquid 237 mL  237 mL Oral BID BM Jonetta Osgood, MD   237 mL at 06/09/17 1004  . gabapentin (NEURONTIN) capsule 800 mg  800 mg Oral TID Karmen Bongo, MD   800 mg at 06/09/17  1001  . guaiFENesin (MUCINEX) 12 hr tablet 600 mg  600 mg Oral BID Jonetta Osgood, MD   600 mg at 06/09/17 1000  . hydrALAZINE (APRESOLINE) injection 5 mg  5 mg Intravenous Q6H PRN Kirby-Graham, Karsten Fells, NP      . hydrOXYzine (ATARAX/VISTARIL) tablet 25 mg  25 mg Oral Ivery Quale, MD   25 mg at 06/08/17 2217  . insulin aspart (novoLOG) injection 0-15 Units  0-15 Units Subcutaneous TID WC Karmen Bongo, MD   3 Units at 06/09/17 [phone]  . insulin aspart (novoLOG) injection 0-5 Units  0-5 Units Subcutaneous QHS Karmen Bongo, MD      . ipratropium-albuterol (DUONEB) 0.5-2.5 (3) MG/3ML nebulizer solution 3 mL  3 mL Nebulization Q6H Ghimire, Henreitta Leber, MD   3 mL at 06/09/17 0932  . lidocaine (LIDODERM) 5 % 1 patch  1 patch Transdermal Q24H Karmen Bongo, MD   1 patch at 06/08/17 1359  . lisinopril (PRINIVIL,ZESTRIL) tablet 20 mg  20 mg Oral Daily Karmen Bongo, MD   20 mg at 06/09/17 1002  . loratadine (CLARITIN) tablet 10 mg  10 mg Oral Daily Karmen Bongo, MD   10 mg at 06/09/17 1001  . methylPREDNISolone sodium succinate (SOLU-MEDROL) 40 mg/mL injection 40 mg  40 mg Intravenous Q8H Ghimire, Shanker M, MD      . morphine 4 MG/ML injection 2 mg  2 mg Intravenous Q2H PRN Karmen Bongo, MD   2 mg at 06/09/17 0953  . nicotine (NICODERM CQ - dosed in mg/24 hours) patch 14 mg  14 mg Transdermal Daily Karmen Bongo, MD   14 mg at 06/09/17 1000  . ondansetron (ZOFRAN) tablet 4 mg  4 mg Oral Q6H PRN Karmen Bongo, MD   4 mg at 06/07/17 1932   Or  . ondansetron [facility]) injection 4 mg  4 mg Intravenous Q6H PRN Karmen Bongo, MD   4 mg at 06/08/17 1020  . pantoprazole (PROTONIX) EC tablet 40 mg  40 mg Oral Daily Karmen Bongo, MD   40 mg at 06/09/17 1001  . primidone (MYSOLINE) tablet 250 mg  250 mg Oral BID Karmen Bongo, MD   250 mg at 06/09/17 1002  . traMADol (ULTRAM) tablet 50 mg  50 mg Oral Q6H PRN Karmen Bongo, MD   50 mg at 06/07/17 2200     Discharge  Medications: Please see discharge summary for a list of discharge medications.  Relevant Imaging Results:  Relevant Lab Results:   Additional Information SS# [place], [place]

## 2017-06-09 NOTE — Progress Notes (Signed)
  Echocardiogram Echocardiogram Transesophageal has been performed.  Jannett Celestine 06/09/2017, 3:55 PM

## 2017-06-09 NOTE — Progress Notes (Signed)
PROGRESS NOTE        PATIENT DETAILS Name: Kristen Mason Age: 83 y.o. Sex: female Date of Birth: July 04, 1934 Admit Date: 06/07/2017 Admitting Physician Karmen Bongo, MD ONG:[id number], Denton Ar, MD  Brief Narrative: Patient is a 83 y.o. female with long-standing history of tobacco use, hypertension, diabetes-admitted with a mechanical fall-found to have multiple right-sided rib fractures and acute hypoxic respiratory failure likely secondary to (undiagnosed)COPD exacerbation.  See below for further details  Subjective: Continues to have pleuritic pain but appears much more comfortable than yesterday.  Breathing seems to have markedly improved.  Assessment/Plan: Acute hypoxic respiratory failure likely secondary to COPD with exacerbation: Does not have a formal diagnosis of COPD-but given long-standing history of tobacco use-suspected to have COPD exacerbation-given constellation of clinical features.  Improving his steroids and bronchodilators.  Mostly clear to auscultation only a few scattered rhonchi this morning.  Continue steroids-but will begin to taper, continue bronchodilators.  Will need formal PFTs in the outpatient setting.  Note CT angiogram of the chest was negative for pulmonary embolism.   Right-sided rib fracture: Secondary to mechanical fall-continue to encourage incentive spirometry and other supportive measures.  Currently on scheduled Tylenol, as needed tramadol and narcotics.  Suspect pain will improve with time.   Frequent falls: Appreciate PT evaluation-plans are for SNF on discharge.  DM-2: CBG stable on SSI.  Hypertension: Controlled-continue with amlodipine and lisinopril.    Tremors: Not sure if patient has Parkinson's disease-she seems to be maintained on Sinemet and primidone-she could have autonomic dysfunction related to Parkinson disease that could be contributing to frequent falls.  Peripheral neuropathy: Continue  Neurontin  Depression/anxiety: Appears to be stable-although slightly anxious due to pain-continue with Klonopin and Celexa.  DVT Prophylaxis: Prophylactic Lovenox   Code Status: DNR  Family Communication: Daughter at bedside  Disposition Plan: Remain inpatient-SNF on discharge-when bed available.  Antimicrobial agents: Anti-infectives (From admission, onward)   None      Procedures: None  CONSULTS:  None  Time spent: 25- minutes-Greater than 50% of this time was spent in counseling, explanation of diagnosis, planning of further management, and coordination of care.  MEDICATIONS: Scheduled Meds: . acetaminophen  1,000 mg Oral Q8H  . amLODipine  10 mg Oral Daily  . aspirin EC  81 mg Oral Daily  . carbidopa-levodopa  1 tablet Oral TID  . citalopram  40 mg Oral Daily  . clonazePAM  1 mg Oral QHS  . dicyclomine  10 mg Oral TID AC & HS  . docusate sodium  100 mg Oral BID  . enoxaparin (LOVENOX) injection  40 mg Subcutaneous Q24H  . feeding supplement (GLUCERNA SHAKE)  237 mL Oral BID BM  . gabapentin  800 mg Oral TID  . guaiFENesin  600 mg Oral BID  . hydrOXYzine  25 mg Oral QHS  . insulin aspart  0-15 Units Subcutaneous TID WC  . insulin aspart  0-5 Units Subcutaneous QHS  . ipratropium-albuterol  3 mL Nebulization Q6H  . lidocaine  1 patch Transdermal Q24H  . lisinopril  20 mg Oral Daily  . loratadine  10 mg Oral Daily  . methylPREDNISolone (SOLU-MEDROL) injection  60 mg Intravenous Q8H  . nicotine  14 mg Transdermal Daily  . pantoprazole  40 mg Oral Daily  . primidone  250 mg Oral BID   Continuous Infusions: PRN Meds:.albuterol, hydrALAZINE, morphine injection,  ondansetron **OR** ondansetron (ZOFRAN) IV, traMADol   PHYSICAL EXAM: Vital signs: Vitals:   06/09/17 0119 06/09/17 0509 06/09/17 0600 06/09/17 0934  BP:  (!) 113/47    Pulse:  (!) 16 65   Resp:  18    Temp:  97.9 F (36.6 C)    TempSrc:  Oral    SpO2: 96% 98%  94%  Weight:      Height:        Filed Weights   06/07/17 0811  Weight: 70.8 kg (156 lb)   Body mass index is 25.96 kg/m.   General appearance :Awake, alert, not in any distress.  Frail looking. Eyes:, pupils equally reactive to light and accomodation,no scleral icterus. HEENT: Atraumatic and Normocephalic Neck: supple, no JVD. Resp:Good air entry bilaterally, no rhonchi. CVS: S1 S2 regular, no murmurs.  GI: Bowel sounds present, Non tender and not distended with no gaurding, rigidity or rebound. Extremities: B/L Lower Ext shows no edema, both legs are warm to touch Neurology:  speech clear,Non focal, sensation is grossly intact. Musculoskeletal:No digital cyanosis Skin:No Rash, warm and dry Wounds:N/A  I have personally reviewed following labs and imaging studies  LABORATORY DATA: CBC: Recent Labs  Lab 06/07/17 0826 06/08/17 0410  WBC 4.7 6.5  NEUTROABS 3.3  --   HGB 12.4 11.9*  HCT 39.9 38.1  MCV 102.0* 101.6*  PLT 158 761    Basic Metabolic Panel: Recent Labs  Lab 06/07/17 0826 06/08/17 0410  NA 139 136  K 4.5 4.3  CL 104 98*  CO2 27 28  GLUCOSE 165* 145*  BUN 14 19  CREATININE 1.00 0.96  CALCIUM 8.7* 8.4*    GFR: Estimated Creatinine Clearance: 46.1 mL/min (by C-G formula based on SCr of 0.96 mg/dL).  Liver Function Tests: Recent Labs  Lab 06/07/17 0826  AST 17  ALT 6*  ALKPHOS 114  BILITOT 0.5  PROT 7.2  ALBUMIN 3.6   No results for input(s): LIPASE, AMYLASE in the last 168 hours. No results for input(s): AMMONIA in the last 168 hours.  Coagulation Profile: No results for input(s): INR, PROTIME in the last 168 hours.  Cardiac Enzymes: No results for input(s): CKTOTAL, CKMB, CKMBINDEX, TROPONINI in the last 168 hours.  BNP (last 3 results) No results for input(s): PROBNP in the last 8760 hours.  HbA1C: Recent Labs    06/07/17 0826  HGBA1C 6.6*    CBG: Recent Labs  Lab 06/08/17 1734 06/08/17 1915 06/08/17 2313 06/09/17 0831 06/09/17 1149  GLUCAP 212*  197* 167* 159* 186*    Lipid Profile: No results for input(s): CHOL, HDL, LDLCALC, TRIG, CHOLHDL, LDLDIRECT in the last 72 hours.  Thyroid Function Tests: No results for input(s): TSH, T4TOTAL, FREET4, T3FREE, THYROIDAB in the last 72 hours.  Anemia Panel: No results for input(s): VITAMINB12, FOLATE, FERRITIN, TIBC, IRON, RETICCTPCT in the last 72 hours.  Urine analysis:    Component Value Date/Time   COLORURINE AMBER (A) 06/07/2017 1455   APPEARANCEUR CLEAR 06/07/2017 1455   LABSPEC 1.013 06/07/2017 1455   PHURINE 6.0 06/07/2017 1455   GLUCOSEU NEGATIVE 06/07/2017 1455   HGBUR NEGATIVE 06/07/2017 1455   BILIRUBINUR NEGATIVE 06/07/2017 1455   KETONESUR NEGATIVE 06/07/2017 1455   PROTEINUR 30 (A) 06/07/2017 1455   NITRITE POSITIVE (A) 06/07/2017 1455   LEUKOCYTESUR NEGATIVE 06/07/2017 1455    Sepsis Labs: Lactic Acid, Venous    Component Value Date/Time   LATICACIDVEN 0.96 06/07/2017 0841    MICROBIOLOGY: No results found for this or any  previous visit (from the past 240 hour(s)).  RADIOLOGY STUDIES/RESULTS: Dg Chest 2 View  Result Date: 06/07/2017 CLINICAL DATA:  83 year old female post fall with right greater than left chest/rib pain. Initial encounter. EXAM: CHEST - 2 VIEW COMPARISON:  05/05/2017 head CT.  02/10/2016 chest x-ray. FINDINGS: On prior exams patient was noted to have remote rib fractures with callus formation right fifth through seventh rib. Minimal cortical irregularity right sixth rib and therefore acute right sixth rib fracture cannot be excluded. Rib detail films can be obtained for further delineation if clinically desired. No pneumothorax. PET CT detected pulmonary nodules not well delineated by plain film exam. 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 Heart size top-normal. Central pulmonary vascular prominence without pulmonary edema. Calcified aorta. No thoracic compression fracture noted. IMPRESSION: On prior exams  patient was noted to have remote rib fractures with callus formation right fifth through seventh rib. Minimal cortical irregularity right sixth rib and therefore acute right sixth rib fracture cannot be excluded. Rib detail films can be obtained for further delineation if clinically desired. No pneumothorax. PET CT detected pulmonary nodules not well delineated by plain film exam. 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 Aortic Atherosclerosis (ICD10-I70.0). Electronically Signed   By: Genia Del M.D.   On: 06/07/2017 08:56   Dg Ribs Unilateral Right  Result Date: 06/07/2017 CLINICAL DATA:  Pain following fall EXAM: RIGHT RIBS - 2 VIEW COMPARISON:  Chest radiograph Jun 07, 2017 and chest CT April 22, 2017 FINDINGS: Oblique and cone-down rib images obtained. There are mildly displaced fractures of the lateral right fourth, fifth, and sixth ribs which appear recent/acute. No pneumothorax or pleural effusion evident on the right. No other rib lesions evident. Right-sided thyroid enlargement with displacement of the upper thoracic trachea toward the left again noted. IMPRESSION: Acute fractures with mild displacement of the lateral right fourth, fifth, and sixth ribs. No evident pneumothorax or pleural effusion on the right. Right lung clear. Enlarged thyroid on the right, documented previously. Electronically Signed   By: Lowella Grip III M.D.   On: 06/07/2017 10:48   Ct Head Wo Contrast  Result Date: 06/07/2017 CLINICAL DATA:  Intermittent vertigo. EXAM: CT HEAD WITHOUT CONTRAST TECHNIQUE: Contiguous axial images were obtained from the base of the skull through the vertex without intravenous contrast. COMPARISON:  None. FINDINGS: Brain: No evidence of acute infarction, hemorrhage, hydrocephalus, extra-axial collection or mass lesion/mass effect. Mild age related cerebral atrophy. Scattered periventricular and subcortical white matter hypodensities are nonspecific, but  favored to reflect chronic microvascular ischemic changes. Vascular: Atherosclerotic vascular calcification of the carotid siphons. No hyperdense vessel. Skull: Negative for fracture or focal lesion. Sinuses/Orbits: No acute finding. Other: None. IMPRESSION: 1.  No acute intracranial abnormality. 2. Mild age related cerebral atrophy and chronic microvascular ischemic changes. Electronically Signed   By: Titus Dubin M.D.   On: 06/07/2017 09:26   Ct Angio Chest Pe W And/or Wo Contrast  Result Date: 06/07/2017 CLINICAL DATA:  Shortness of breath.  Hypoxia and dyspnea EXAM: CT ANGIOGRAPHY CHEST WITH CONTRAST TECHNIQUE: Multidetector CT imaging of the chest was performed using the standard protocol during bolus administration of intravenous contrast. Multiplanar CT image reconstructions and MIPs were obtained to evaluate the vascular anatomy. CONTRAST:  100 cc Isovue 370 intravenous COMPARISON:  04/22/2017 and PET-CT 05/05/2017 FINDINGS: Cardiovascular:  Cardiomegaly without pericardial effusion. Mediastinum/Nodes: Multinodular goiter with tracheal deviation to the left, known. Negative for adenopathy Lungs/Pleura: Frothy appearing opacification of left  lower lobe lobar and segmental airways. Similar appearance was seen on the most recent prior. Mild atelectasis at the bases. Calcified pulmonary nodule in the right lower lobe. 12 mm noncalcified pulmonary nodule in the left upper lobe, evaluated by PET CT last month. Left upper lobe ground-glass nodule is not discretely visualized today Upper Abdomen: Cholelithiasis.  Colonic diverticulosis. Musculoskeletal: Lateral right fourth, fifth, sixth, and seventh ribs fractures without visible callus. A posterior right sixth rib is also fractured. Advanced bilateral glenohumeral osteoarthritis. Review of the MIP images confirms the above findings. IMPRESSION: 1. Acute lateral right fourth through seventh rib fractures. There is also a posterior right sixth rib fracture.  2. Left lower lobe airway debris with atelectasis. There is a background of generalized airway thickening. 3. Negative for pulmonary embolism. 4. Multinodular goiter. 5. 12 mm left upper lobe pulmonary nodule evaluated by PET CT last month. 6. Aortic Atherosclerosis (ICD10-I70.0). 7. Cholelithiasis. Electronically Signed   By: Monte Fantasia M.D.   On: 06/07/2017 14:56     LOS: 1 day   Oren Binet, MD  [facility] Pager:[phone]  If 7PM-7AM, please contact night-coverage [email or web address] Password [facility] 06/09/2017, 12:16 PM

## 2017-06-09 NOTE — Social Work (Signed)
CSW met with pt and pt daughter [name] at bedside. Pt and pt daughter are in agreement for placement so that pt can get stronger and return home safely to her daughters home. CSW will complete and send FL2 out to facilities.   Alexander Mt, [place] Work [phone]

## 2017-06-09 NOTE — Social Work (Signed)
Options given to pt and pt daughter, will f/u tomorrow.   Alexander Mt, [place] Work [phone]

## 2017-06-09 NOTE — Progress Notes (Signed)
Nutrition Follow-up  DOCUMENTATION CODES:   Not applicable  INTERVENTION:   -Continue Glucerna Shake po BID, each supplement provides 220 kcal and 10 grams of protein -MVI daily  NUTRITION DIAGNOSIS:   Increased nutrient needs related to chronic illness as evidenced by estimated needs.  Ongoing  GOAL:   Patient will meet greater than or equal to 90% of their needs  Progressing  MONITOR:   PO intake, Supplement acceptance, Labs, Skin, Weight trends, I & O's  REASON FOR ASSESSMENT:   Consult COPD Protocol  ASSESSMENT:   83 y.o. Female with long-standing history of tobacco use, hypertension, diabetes-admitted with a mechanical fall-found to have multiple right-sided rib fractures and acute hypoxic respiratory failure likely secondary to (undiagnosed)COPD exacerbation.  Spoke with pt and daughter at bedside. Pt daughter reports pt has a hearty appetite at home, consuming 3 meals per day. Pt endorses poor, but improving intake during hospitalization secondary to nausea (noted meal completion 25-50%). Noted completed Glucerna shake on tray table. Pt enjoys supplements and able to teachback importance of consuming them.   Pt typically ambulates at home with a walker. Per daughter and pt, seeking SNF placement for short term rehab. CSW to evaluate.   Labs reviewed: CBGS: 159-197 (inpatient orders for glycemic control are 0-15 units insulin aspart TID with meals and 0-5 units insulin aspart q HS).   NUTRITION-FOCUSED PHYSICAL EXAM:   Most Recent Value  Orbital Region  No depletion  Upper Arm Region  Mild depletion  Thoracic and Lumbar Region  No depletion  Buccal Region  No depletion  Temple Region  No depletion  Clavicle Bone Region  No depletion  Clavicle and Acromion Bone Region  No depletion  Scapular Bone Region  No depletion  Dorsal Hand  Mild depletion  Patellar Region  Mild depletion  Anterior Thigh Region  Mild depletion  Posterior Calf Region  Mild depletion   Edema (RD Assessment)  None  Hair  Reviewed  Eyes  Reviewed  Mouth  Reviewed  Skin  Reviewed  Nails  Reviewed     Diet Order:   Diet Order           Diet Carb Modified Fluid consistency: Thin; Room service appropriate? Yes  Diet effective now          EDUCATION NEEDS:   No education needs have been identified at this time  Skin:  Skin Assessment: Skin Integrity Issues: Skin Integrity Issues:: Other (Comment) Other: MASD to elbow  Last BM:  06/06/17  Height:   Ht Readings from Last 1 Encounters:  06/07/17 5\' 5"  (1.651 m)    Weight:   Wt Readings from Last 1 Encounters:  06/07/17 156 lb (70.8 kg)    Ideal Body Weight:  56.8 kg  BMI:  Body mass index is 25.96 kg/m.  Estimated Nutritional Needs:   Kcal:  1700-1900  Protein:  80-95 gm  Fluid:  1.7-1.9 L    Ares Tegtmeyer A. Jimmye Norman, RD, LDN, CDE Pager: [phone] After hours Pager: [phone]

## 2017-06-10 LAB — GLUCOSE, CAPILLARY
GLUCOSE-CAPILLARY: 182 mg/dL — AB (ref 65–99)
GLUCOSE-CAPILLARY: 227 mg/dL — AB (ref 65–99)
GLUCOSE-CAPILLARY: 202 mg/dL — AB (ref 65–99)
Glucose-Capillary: 169 mg/dL — ABNORMAL HIGH (ref 65–99)

## 2017-06-10 MED ORDER — IPRATROPIUM-ALBUTEROL 0.5-2.5 (3) MG/3ML IN SOLN
3.0000 mL | Freq: Two times a day (BID) | RESPIRATORY_TRACT | Status: DC
  Administered 2017-06-10 – 2017-06-11 (×2): 3 mL via RESPIRATORY_TRACT
  Filled 2017-06-10 (×2): qty 3

## 2017-06-10 MED ORDER — ALBUTEROL SULFATE (2.5 MG/3ML) 0.083% IN NEBU: 2.5000 mg | INHALATION_SOLUTION | RESPIRATORY_TRACT | Status: DC | PRN

## 2017-06-10 NOTE — Social Work (Signed)
CSW spoke with pt and pt daughter at bedside, they have selected [facility] as their preferred SNF. [place] will have a private room for discharge tomorrow. Pt daughter aware.  Alexander Mt, [place] Work [phone]

## 2017-06-10 NOTE — Consult Note (Signed)
[facility] CM Primary Care Navigator  06/10/2017  Kristen Mason 22-May-1934 [id number]   Met withpatient and daughter [name]) at the bedside toidentify possible discharge needs. Patientreports that she had "fallen at home and fractured 3 ribs" that resulted to thisadmission.  PatientendorsesDr.Karrar Lysle Rubens with [facility] provider.   Patient is using Product/process development scientist on [place] toobtain medications without difficulty.  Patientreports that daughter has been Geographical information systems officer medications at [employer] use of "pill box" system filled every week.  Daughter has been providing transportationto her doctors'appointments.  Patientlives with daughter who serves as Academic librarian home.  Anticipated plan for discharge isskilled nursing facility (SNF)per therapy recommendation, for short term rehabilitation according to daughter.  Patient and daughter voiced understandingto callprimarycareprovider'soffice when she returns backhome,for a post discharge follow-upvisitwithin1- 2 weeksor sooner if needs arise.Patient letter (with PCP's contact number) was provided asareminder.   Explained to patientand daughter regarding [facility] CM services available for health management/ resourcesat homeand wasinterested about it. Patient/ daughter is aware to discuss with primary care provider onnext visit, regarding further health management servicesthatshe willbe needing- once dischargedhome. Patient/ daughter verbalizedunderstandingof needto seekreferral from primary care provider to [facility] care management ifdeemed necessary and appropriatefor anyservicesin the nearfuture-once she returnshome.   [facility] care management information was provided for futureneedsthatshemay have.  Primary care provider's office is listed as providing transition of care (TOC) follow-up.   For additional  questions please contact:  Edwena Felty A. Anihya Tuma, BSN, RN-BC [facility] PRIMARY CARE Navigator Cell: [phone]

## 2017-06-10 NOTE — Progress Notes (Signed)
PROGRESS NOTE        PATIENT DETAILS Name: Kristen Mason Age: 83 y.o. Sex: female Date of Birth: September 05, 1934 Admit Date: 06/07/2017 Admitting Physician Karmen Bongo, MD DDU:[email or web address], Denton Ar, MD  Brief Narrative: Patient is a 83 y.o. female with long-standing history of tobacco use, hypertension, diabetes-admitted with a mechanical fall-found to have multiple right-sided rib fractures and acute hypoxic respiratory failure likely secondary to (undiagnosed)COPD exacerbation.  See below for further details  Subjective: Breathing continues to improve-pleuritic pain is unchanged.  Assessment/Plan: Acute hypoxic respiratory failure likely secondary to COPD with exacerbation: Improved-continue to decrease steroids, continue bronchodilators.  She does not have a formal history of COPD-but given long-standing history of tobacco use and typical clinical features-thought to have COPD.  Will need formal PFTs in the outpatient setting. Note CT angiogram of the chest was negative for pulmonary embolism.   Right-sided rib fracture: Continue to mechanical fall-continues to have pleuritic pain-but seems to be comfortable.  Continue incentive spirometry, scheduled Tylenol and as needed narcotics.  S  Frequent falls: Initiate PT evaluation-plans are for SNF on discharge.    DM-2: CBG stable-continue SSI  Hypertension: Controlled with amlodipine and lisinopril  Essential tremors: She does not have a history of Parkinson's disease-she has been told that she has essential tremors-she apparently is maintained on both Sinemet and primidone.  Peripheral neuropathy: Continue Neurontin  Depression/anxiety: Appears to be stable-although slightly anxious due to pain-continue with Klonopin and Celexa.  DVT Prophylaxis: Prophylactic Lovenox   Code Status: DNR  Family Communication: None at bedside  Disposition Plan: Remain inpatient-SNF 5/17 Antimicrobial  agents: Anti-infectives (From admission, onward)   None      Procedures: None  CONSULTS:  None  Time spent: 25- minutes-Greater than 50% of this time was spent in counseling, explanation of diagnosis, planning of further management, and coordination of care.  MEDICATIONS: Scheduled Meds: . acetaminophen  1,000 mg Oral Q8H  . amLODipine  10 mg Oral Daily  . aspirin EC  81 mg Oral Daily  . carbidopa-levodopa  1 tablet Oral TID  . citalopram  40 mg Oral Daily  . clonazePAM  1 mg Oral QHS  . dicyclomine  10 mg Oral TID AC & HS  . docusate sodium  100 mg Oral BID  . enoxaparin (LOVENOX) injection  40 mg Subcutaneous Q24H  . feeding supplement (GLUCERNA SHAKE)  237 mL Oral BID BM  . gabapentin  800 mg Oral TID  . guaiFENesin  600 mg Oral BID  . hydrOXYzine  25 mg Oral QHS  . insulin aspart  0-15 Units Subcutaneous TID WC  . insulin aspart  0-5 Units Subcutaneous QHS  . ipratropium-albuterol  3 mL Nebulization BID  . lidocaine  1 patch Transdermal Q24H  . lisinopril  20 mg Oral Daily  . loratadine  10 mg Oral Daily  . methylPREDNISolone (SOLU-MEDROL) injection  40 mg Intravenous Q8H  . multivitamin with minerals  1 tablet Oral Daily  . nicotine  14 mg Transdermal Daily  . pantoprazole  40 mg Oral Daily  . primidone  250 mg Oral BID   Continuous Infusions: PRN Meds:.albuterol, hydrALAZINE, morphine injection, ondansetron **OR** ondansetron (ZOFRAN) IV, traMADol   PHYSICAL EXAM: Vital signs: Vitals:   06/09/17 1643 06/09/17 2013 06/09/17 2100 06/10/17 0729  BP:   (!) 117/49   Pulse:   62   Resp:  19   Temp:   98.1 F (36.7 C)   TempSrc:   Oral   SpO2: 94% 98% 95% 95%  Weight:      Height:       Filed Weights   06/07/17 0811  Weight: 70.8 kg (156 lb)   Body mass index is 25.96 kg/m.   General appearance :Awake, alert, not in any distress.  Chronically frail appearing Eyes:, pupils equally reactive to light and accomodation,no scleral icterus. HEENT:  Atraumatic and Normocephalic Neck: supple, no JVD. Resp:Good air entry bilaterally, scattered rhonchi  CVS: S1 S2 regular, no murmurs.  GI: Bowel sounds present, Non tender and not distended with no gaurding, rigidity or rebound. Extremities: B/L Lower Ext shows no edema, both legs are warm to touch Neurology:  speech clear,Non focal, sensation is grossly intact. Musculoskeletal:No digital cyanosis Skin:No Rash, warm and dry Wounds:N/A  I have personally reviewed following labs and imaging studies  LABORATORY DATA: CBC: Recent Labs  Lab 06/07/17 0826 06/08/17 0410  WBC 4.7 6.5  NEUTROABS 3.3  --   HGB 12.4 11.9*  HCT 39.9 38.1  MCV 102.0* 101.6*  PLT 158 462    Basic Metabolic Panel: Recent Labs  Lab 06/07/17 0826 06/08/17 0410  NA 139 136  K 4.5 4.3  CL 104 98*  CO2 27 28  GLUCOSE 165* 145*  BUN 14 19  CREATININE 1.00 0.96  CALCIUM 8.7* 8.4*    GFR: Estimated Creatinine Clearance: 46.1 mL/min (by C-G formula based on SCr of 0.96 mg/dL).  Liver Function Tests: Recent Labs  Lab 06/07/17 0826  AST 17  ALT 6*  ALKPHOS 114  BILITOT 0.5  PROT 7.2  ALBUMIN 3.6   No results for input(s): LIPASE, AMYLASE in the last 168 hours. No results for input(s): AMMONIA in the last 168 hours.  Coagulation Profile: No results for input(s): INR, PROTIME in the last 168 hours.  Cardiac Enzymes: No results for input(s): CKTOTAL, CKMB, CKMBINDEX, TROPONINI in the last 168 hours.  BNP (last 3 results) No results for input(s): PROBNP in the last 8760 hours.  HbA1C: No results for input(s): HGBA1C in the last 72 hours.  CBG: Recent Labs  Lab 06/09/17 1149 06/09/17 1808 06/09/17 2057 06/10/17 0747 06/10/17 1146  GLUCAP 186* 189* 195* 182* 227*    Lipid Profile: No results for input(s): CHOL, HDL, LDLCALC, TRIG, CHOLHDL, LDLDIRECT in the last 72 hours.  Thyroid Function Tests: No results for input(s): TSH, T4TOTAL, FREET4, T3FREE, THYROIDAB in the last 72  hours.  Anemia Panel: No results for input(s): VITAMINB12, FOLATE, FERRITIN, TIBC, IRON, RETICCTPCT in the last 72 hours.  Urine analysis:    Component Value Date/Time   COLORURINE AMBER (A) 06/07/2017 1455   APPEARANCEUR CLEAR 06/07/2017 1455   LABSPEC 1.013 06/07/2017 1455   PHURINE 6.0 06/07/2017 1455   GLUCOSEU NEGATIVE 06/07/2017 1455   HGBUR NEGATIVE 06/07/2017 1455   BILIRUBINUR NEGATIVE 06/07/2017 1455   KETONESUR NEGATIVE 06/07/2017 1455   PROTEINUR 30 (A) 06/07/2017 1455   NITRITE POSITIVE (A) 06/07/2017 1455   LEUKOCYTESUR NEGATIVE 06/07/2017 1455    Sepsis Labs: Lactic Acid, Venous    Component Value Date/Time   LATICACIDVEN 0.96 06/07/2017 0841    MICROBIOLOGY: No results found for this or any previous visit (from the past 240 hour(s)).  RADIOLOGY STUDIES/RESULTS: Dg Chest 2 View  Result Date: 06/07/2017 CLINICAL DATA:  83 year old female post fall with right greater than left chest/rib pain. Initial encounter. EXAM: CHEST - 2 VIEW COMPARISON:  05/05/2017 head CT.  02/10/2016 chest x-ray. FINDINGS: On prior exams patient was noted to have remote rib fractures with callus formation right fifth through seventh rib. Minimal cortical irregularity right sixth rib and therefore acute right sixth rib fracture cannot be excluded. Rib detail films can be obtained for further delineation if clinically desired. No pneumothorax. PET CT detected pulmonary nodules not well delineated by plain film exam. 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 Heart size top-normal. Central pulmonary vascular prominence without pulmonary edema. Calcified aorta. No thoracic compression fracture noted. IMPRESSION: On prior exams patient was noted to have remote rib fractures with callus formation right fifth through seventh rib. Minimal cortical irregularity right sixth rib and therefore acute right sixth rib fracture cannot be excluded. Rib detail films can be  obtained for further delineation if clinically desired. No pneumothorax. PET CT detected pulmonary nodules not well delineated by plain film exam. 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 Aortic Atherosclerosis (ICD10-I70.0). Electronically Signed   By: Genia Del M.D.   On: 06/07/2017 08:56   Dg Ribs Unilateral Right  Result Date: 06/07/2017 CLINICAL DATA:  Pain following fall EXAM: RIGHT RIBS - 2 VIEW COMPARISON:  Chest radiograph Jun 07, 2017 and chest CT April 22, 2017 FINDINGS: Oblique and cone-down rib images obtained. There are mildly displaced fractures of the lateral right fourth, fifth, and sixth ribs which appear recent/acute. No pneumothorax or pleural effusion evident on the right. No other rib lesions evident. Right-sided thyroid enlargement with displacement of the upper thoracic trachea toward the left again noted. IMPRESSION: Acute fractures with mild displacement of the lateral right fourth, fifth, and sixth ribs. No evident pneumothorax or pleural effusion on the right. Right lung clear. Enlarged thyroid on the right, documented previously. Electronically Signed   By: Lowella Grip III M.D.   On: 06/07/2017 10:48   Ct Head Wo Contrast  Result Date: 06/07/2017 CLINICAL DATA:  Intermittent vertigo. EXAM: CT HEAD WITHOUT CONTRAST TECHNIQUE: Contiguous axial images were obtained from the base of the skull through the vertex without intravenous contrast. COMPARISON:  None. FINDINGS: Brain: No evidence of acute infarction, hemorrhage, hydrocephalus, extra-axial collection or mass lesion/mass effect. Mild age related cerebral atrophy. Scattered periventricular and subcortical white matter hypodensities are nonspecific, but favored to reflect chronic microvascular ischemic changes. Vascular: Atherosclerotic vascular calcification of the carotid siphons. No hyperdense vessel. Skull: Negative for fracture or focal lesion. Sinuses/Orbits: No acute finding.  Other: None. IMPRESSION: 1.  No acute intracranial abnormality. 2. Mild age related cerebral atrophy and chronic microvascular ischemic changes. Electronically Signed   By: Titus Dubin M.D.   On: 06/07/2017 09:26   Ct Angio Chest Pe W And/or Wo Contrast  Result Date: 06/07/2017 CLINICAL DATA:  Shortness of breath.  Hypoxia and dyspnea EXAM: CT ANGIOGRAPHY CHEST WITH CONTRAST TECHNIQUE: Multidetector CT imaging of the chest was performed using the standard protocol during bolus administration of intravenous contrast. Multiplanar CT image reconstructions and MIPs were obtained to evaluate the vascular anatomy. CONTRAST:  100 cc Isovue 370 intravenous COMPARISON:  04/22/2017 and PET-CT 05/05/2017 FINDINGS: Cardiovascular:  Cardiomegaly without pericardial effusion. Mediastinum/Nodes: Multinodular goiter with tracheal deviation to the left, known. Negative for adenopathy Lungs/Pleura: Frothy appearing opacification of left lower lobe lobar and segmental airways. Similar appearance was seen on the most recent prior. Mild atelectasis at the bases. Calcified pulmonary nodule in the right lower lobe. 12 mm noncalcified pulmonary nodule in the left upper lobe, evaluated by PET  CT last month. Left upper lobe ground-glass nodule is not discretely visualized today Upper Abdomen: Cholelithiasis.  Colonic diverticulosis. Musculoskeletal: Lateral right fourth, fifth, sixth, and seventh ribs fractures without visible callus. A posterior right sixth rib is also fractured. Advanced bilateral glenohumeral osteoarthritis. Review of the MIP images confirms the above findings. IMPRESSION: 1. Acute lateral right fourth through seventh rib fractures. There is also a posterior right sixth rib fracture. 2. Left lower lobe airway debris with atelectasis. There is a background of generalized airway thickening. 3. Negative for pulmonary embolism. 4. Multinodular goiter. 5. 12 mm left upper lobe pulmonary nodule evaluated by PET CT last  month. 6. Aortic Atherosclerosis (ICD10-I70.0). 7. Cholelithiasis. Electronically Signed   By: Monte Fantasia M.D.   On: 06/07/2017 14:56     LOS: 2 days   Oren Binet, MD  [facility] Pager:[phone]  If 7PM-7AM, please contact night-coverage [email or web address] Password [facility] 06/10/2017, 3:04 PM

## 2017-06-11 DIAGNOSIS — R0781 Pleurodynia: Secondary | ICD-10-CM | POA: Diagnosis not present

## 2017-06-11 DIAGNOSIS — W19XXXD Unspecified fall, subsequent encounter: Secondary | ICD-10-CM | POA: Diagnosis not present

## 2017-06-11 DIAGNOSIS — Y92009 Unspecified place in unspecified non-institutional (private) residence as the place of occurrence of the external cause: Secondary | ICD-10-CM | POA: Diagnosis not present

## 2017-06-11 DIAGNOSIS — R2689 Other abnormalities of gait and mobility: Secondary | ICD-10-CM | POA: Diagnosis not present

## 2017-06-11 DIAGNOSIS — E119 Type 2 diabetes mellitus without complications: Secondary | ICD-10-CM | POA: Diagnosis not present

## 2017-06-11 DIAGNOSIS — F17200 Nicotine dependence, unspecified, uncomplicated: Secondary | ICD-10-CM

## 2017-06-11 DIAGNOSIS — F419 Anxiety disorder, unspecified: Secondary | ICD-10-CM | POA: Diagnosis not present

## 2017-06-11 DIAGNOSIS — G63 Polyneuropathy in diseases classified elsewhere: Secondary | ICD-10-CM | POA: Diagnosis not present

## 2017-06-11 DIAGNOSIS — S2241XD Multiple fractures of ribs, right side, subsequent encounter for fracture with routine healing: Secondary | ICD-10-CM | POA: Diagnosis not present

## 2017-06-11 DIAGNOSIS — E1142 Type 2 diabetes mellitus with diabetic polyneuropathy: Secondary | ICD-10-CM | POA: Diagnosis not present

## 2017-06-11 DIAGNOSIS — G250 Essential tremor: Secondary | ICD-10-CM | POA: Diagnosis not present

## 2017-06-11 DIAGNOSIS — J9601 Acute respiratory failure with hypoxia: Secondary | ICD-10-CM | POA: Diagnosis not present

## 2017-06-11 DIAGNOSIS — W19XXXA Unspecified fall, initial encounter: Secondary | ICD-10-CM | POA: Diagnosis not present

## 2017-06-11 DIAGNOSIS — Z9181 History of falling: Secondary | ICD-10-CM | POA: Diagnosis not present

## 2017-06-11 DIAGNOSIS — K219 Gastro-esophageal reflux disease without esophagitis: Secondary | ICD-10-CM | POA: Diagnosis not present

## 2017-06-11 DIAGNOSIS — M6281 Muscle weakness (generalized): Secondary | ICD-10-CM | POA: Diagnosis not present

## 2017-06-11 DIAGNOSIS — G20 Parkinson's disease: Secondary | ICD-10-CM | POA: Diagnosis not present

## 2017-06-11 DIAGNOSIS — I10 Essential (primary) hypertension: Secondary | ICD-10-CM | POA: Diagnosis not present

## 2017-06-11 DIAGNOSIS — F329 Major depressive disorder, single episode, unspecified: Secondary | ICD-10-CM | POA: Diagnosis not present

## 2017-06-11 DIAGNOSIS — G629 Polyneuropathy, unspecified: Secondary | ICD-10-CM | POA: Diagnosis not present

## 2017-06-11 DIAGNOSIS — J441 Chronic obstructive pulmonary disease with (acute) exacerbation: Secondary | ICD-10-CM | POA: Diagnosis not present

## 2017-06-11 LAB — GLUCOSE, CAPILLARY
GLUCOSE-CAPILLARY: 177 mg/dL — AB (ref 65–99)
GLUCOSE-CAPILLARY: 256 mg/dL — AB (ref 65–99)

## 2017-06-11 MED ORDER — CLONAZEPAM 1 MG PO TABS: 1.0000 mg | ORAL_TABLET | Freq: Every day | ORAL | 0 refills | Status: DC

## 2017-06-11 MED ORDER — IPRATROPIUM-ALBUTEROL 0.5-2.5 (3) MG/3ML IN SOLN: 3.0000 mL | Freq: Four times a day (QID) | RESPIRATORY_TRACT | Status: DC

## 2017-06-11 MED ORDER — LIDOCAINE 5 % EX PTCH: 1.0000 | MEDICATED_PATCH | CUTANEOUS | 0 refills | Status: DC

## 2017-06-11 MED ORDER — OXYCODONE HCL 5 MG PO TABS: 5.0000 mg | ORAL_TABLET | Freq: Four times a day (QID) | ORAL | 0 refills | Status: DC | PRN

## 2017-06-11 MED ORDER — PREDNISONE 10 MG PO TABS: ORAL_TABLET | ORAL | 0 refills | Status: DC

## 2017-06-11 MED ORDER — NICOTINE 14 MG/24HR TD PT24: 14.0000 mg | MEDICATED_PATCH | Freq: Every day | TRANSDERMAL | 0 refills | Status: DC

## 2017-06-11 MED ORDER — GUAIFENESIN ER 600 MG PO TB12: 600.0000 mg | ORAL_TABLET | Freq: Two times a day (BID) | ORAL | Status: DC

## 2017-06-11 MED ORDER — TRAMADOL HCL 50 MG PO TABS: 50.0000 mg | ORAL_TABLET | Freq: Four times a day (QID) | ORAL | 0 refills | Status: DC | PRN

## 2017-06-11 MED ORDER — ALBUTEROL SULFATE (2.5 MG/3ML) 0.083% IN NEBU: 2.5000 mg | INHALATION_SOLUTION | RESPIRATORY_TRACT | 12 refills | Status: DC | PRN

## 2017-06-11 MED ORDER — ACETAMINOPHEN 500 MG PO TABS: 1000.0000 mg | ORAL_TABLET | Freq: Three times a day (TID) | ORAL | 0 refills | Status: DC

## 2017-06-11 MED ORDER — GLUCERNA SHAKE PO LIQD: 237.0000 mL | Freq: Two times a day (BID) | ORAL | 0 refills | Status: DC

## 2017-06-11 NOTE — Care Management Important Message (Addendum)
Important Message  Patient Details  Name: Kristen Mason MRN: [id number] Date of Birth: May 12, 1934   Medicare Important Message Given:  Yes  Unsigned copy left  Due to illness/Unsigned copy left  Mathilde Mcwherter 06/11/2017, 1:37 PM

## 2017-06-11 NOTE — Social Work (Signed)
Clinical Social Worker facilitated patient discharge including contacting patient family and facility to confirm patient discharge plans.  Clinical information faxed to facility and family agreeable with plan.  CSW arranged ambulance transport via [facility] to [facility] at 12pm. RN to call [phone] with report prior to discharge.  Clinical Social Worker will sign off for now as social work intervention is no longer needed. Please consult [place] again if new need arises.  Alexander Mt, [place] Social Worker [phone]

## 2017-06-11 NOTE — Clinical Social Work Placement (Signed)
   CLINICAL SOCIAL WORK PLACEMENT  NOTE [facility]  Date:  06/11/2017  Patient Details  Name: Kristen Mason MRN: [id number] Date of Birth: September 04, 1934  Clinical Social Work is seeking post-discharge placement for this patient at the [place] level of care (*CSW will initial, date and re-position this form in  chart as items are completed):  Yes   Patient/family provided with [place] Work Department's list of facilities offering this level of care within the geographic area requested by the patient (or if unable, by the patient's family).  Yes   Patient/family informed of their freedom to choose among providers that offer the needed level of care, that participate in Medicare, Medicaid or managed care program needed by the patient, have an available bed and are willing to accept the patient.  Yes   Patient/family informed of [facility]'s ownership interest in [facility] and [facility], as well as of the fact that they are under no obligation to receive care at these facilities.  PASRR submitted to EDS on       PASRR number received on 06/09/17     Existing PASRR number confirmed on       FL2 transmitted to all facilities in geographic area requested by pt/family on 06/09/17     FL2 transmitted to all facilities within larger geographic area on       Patient informed that his/her managed care company has contracts with or will negotiate with certain facilities, including the following:        Yes   Patient/family informed of bed offers received.  Patient chooses bed at [facility]     Physician recommends and patient chooses bed at      Patient to be transferred to [facility] on 06/10/17.  Patient to be transferred to facility by [facility]     Patient family notified on 06/11/17 of transfer.  Name of family member notified:  daughter, [name]     PHYSICIAN Please sign DNR, Please  prepare priority discharge summary, including medications     Additional Comment:    _______________________________________________ Alexander Mt, [place] 06/11/2017, 8:37 AM

## 2017-06-11 NOTE — Discharge Summary (Signed)
PATIENT DETAILS Name: Kristen Mason Age: 83 y.o. Sex: female Date of Birth: 1934-06-07 MRN: [id number]. Admitting Physician: Karmen Bongo, MD OYD:XAJOIN, Denton Ar, MD  Admit Date: 06/07/2017 Discharge date: 06/11/2017  Recommendations for Outpatient Follow-up:  1. Follow up with PCP in 1-2 weeks 2. Please obtain BMP/CBC in one week 3. Please ensure follow-up with pulmonology for formal PFTs 4. Continue to encourage incentive spirometry while at SNF  Admitted From:  Home  Disposition: SNF   Home Health: No  Equipment/Devices: None  Discharge Condition: Stable  CODE STATUS:  DNR  Diet recommendation:  Heart Healthy / Carb Modified  Brief Summary: See H&P, Labs, Consult and Test reports for all details in brief, Patient is a 83 y.o. female with long-standing history of tobacco use, hypertension, diabetes-admitted with a mechanical fall-found to have multiple right-sided rib fractures and acute hypoxic respiratory failure likely secondary to (undiagnosed)COPD exacerbation.  See below for further details  Brief Hospital Course: Acute hypoxic respiratory failure likely secondary to COPD with exacerbation: Does not have a formal diagnosis of COPD-but given long-standing history of tobacco use-suspected to have COPD exacerbation-given constellation of clinical features.  Improving with steroids and bronchodilators.  Mostly clear to auscultation only a few scattered rhonchi this morning.  Continue steroids-but will transition to prednisone for a 1 week taper, continue with bronchodilators on discharge.  Will need formal PFTs in the outpatient setting.  Note CT angiogram of the chest was negative for pulmonary embolism.   Right-sided rib fracture: Secondary to mechanical fall-continue to encourage incentive spirometry and other supportive measures.  Currently on scheduled Tylenol, as needed tramadol and narcotics.  Suspect pain will improve with time.   Frequent falls:  Appreciate PT evaluation-plans are for SNF on discharge.  DM-2: CBG stable on SSI.  Resume oral hypoglycemic agents on discharge  Hypertension: Controlled-continue with amlodipine and lisinopril.    Essential tremors: Denies that she has Parkinson's disease-however is maintained on Sinemet and primidone.   Peripheral neuropathy: Continue Neurontin  Depression/anxiety: Appears to be stable-although slightly anxious due to pain-continue with Klonopin and Celexa.  Procedures/Studies: None  Discharge Diagnoses:  Principal Problem:   Acute respiratory failure with hypoxia [facility]) Active Problems:   Fall at home, initial encounter   Multiple rib fractures   Controlled diabetes mellitus ([place])   Essential hypertension   Tobacco dependence   Discharge Instructions:  Activity:  As tolerated with Full fall precautions use walker/cane & assistance as needed  Discharge Instructions    Diet - low sodium heart healthy   Complete by:  As directed    Diet Carb Modified   Complete by:  As directed    Discharge instructions   Complete by:  As directed    Follow with Primary MD  Wenda Low, MD in 1 week  You will require follow-up at the pulmonology clinic for formal pulmonary function tests  Please get a complete blood count and chemistry panel checked by your Primary MD at your next visit, and again as instructed by your Primary MD.  Get Medicines reviewed and adjusted: Please take all your medications with you for your next visit with your Primary MD  Laboratory/radiological data: Please request your Primary MD to go over all hospital tests and procedure/radiological results at the follow up, please ask your Primary MD to get all Hospital records sent to his/her office.  In some cases, they will be blood work, cultures and biopsy results pending at the time of your discharge. Please request that your  primary care M.D. follows up on these results.  Also Note the  following: If you experience worsening of your admission symptoms, develop shortness of breath, life threatening emergency, suicidal or homicidal thoughts you must seek medical attention immediately by calling 911 or calling your MD immediately  if symptoms less severe.  You must read complete instructions/literature along with all the possible adverse reactions/side effects for all the Medicines you take and that have been prescribed to you. Take any new Medicines after you have completely understood and accpet all the possible adverse reactions/side effects.   Do not drive when taking Pain medications or sleeping medications (Benzodaizepines)  Do not take more than prescribed Pain, Sleep and Anxiety Medications. It is not advisable to combine anxiety,sleep and pain medications without talking with your primary care practitioner  Special Instructions: If you have smoked or chewed Tobacco  in the last 2 yrs please stop smoking, stop any regular Alcohol  and or any Recreational drug use.  Wear Seat belts while driving.  Please note: You were cared for by a hospitalist during your hospital stay. Once you are discharged, your primary care physician will handle any further medical issues. Please note that NO REFILLS for any discharge medications will be authorized once you are discharged, as it is imperative that you return to your primary care physician (or establish a relationship with a primary care physician if you do not have one) for your post hospital discharge needs so that they can reassess your need for medications and monitor your lab values.   Increase activity slowly   Complete by:  As directed      Allergies as of 06/11/2017      Reactions   Statins Other (See Comments)      Medication List    TAKE these medications   acetaminophen 500 MG tablet Commonly known as:  TYLENOL Take 2 tablets (1,000 mg total) by mouth every 8 (eight) hours. For 5 more days   albuterol (2.5 MG/3ML)  0.083% nebulizer solution Commonly known as:  PROVENTIL Take 3 mLs (2.5 mg total) by nebulization every 4 (four) hours as needed for shortness of breath.   amLODipine 10 MG tablet Commonly known as:  NORVASC Take 10 mg by mouth daily.   aspirin 81 MG tablet Take 81 mg by mouth daily.   carbidopa-levodopa 25-100 MG tablet Commonly known as:  SINEMET IR Take 1 tablet by mouth 3 (three) times daily.   cetirizine 10 MG tablet Commonly known as:  ZYRTEC Take 10 mg by mouth daily.   citalopram 40 MG tablet Commonly known as:  CELEXA Take 40 mg by mouth daily.   clonazePAM 1 MG tablet Commonly known as:  KLONOPIN Take 1 tablet (1 mg total) by mouth at bedtime.   dicyclomine 10 MG capsule Commonly known as:  BENTYL Take 10 mg by mouth 4 (four) times daily -  before meals and at bedtime.   diphenhydrAMINE 25 MG tablet Commonly known as:  SOMINEX Take 25 mg by mouth at bedtime as needed for sleep.   feeding supplement (GLUCERNA SHAKE) Liqd Take 237 mLs by mouth 2 (two) times daily between meals.   Fish Oil 1000 MG Cpdr Take 1,000 mg by mouth daily.   gabapentin 800 MG tablet Commonly known as:  NEURONTIN Take 800 mg by mouth 3 (three) times daily.   guaiFENesin 600 MG 12 hr tablet Commonly known as:  MUCINEX Take 1 tablet (600 mg total) by mouth 2 (two) times daily.  hydrOXYzine 25 MG tablet Commonly known as:  ATARAX/VISTARIL Take 25 mg by mouth at bedtime.   ipratropium-albuterol 0.5-2.5 (3) MG/3ML Soln Commonly known as:  DUONEB Take 3 mLs by nebulization every 6 (six) hours.   lidocaine 5 % Commonly known as:  LIDODERM Place 1 patch onto the skin daily. Remove & Discard patch within 12 hours or as directed by MD   lisinopril 20 MG tablet Commonly known as:  PRINIVIL,ZESTRIL Take 20 mg by mouth daily.   nicotine 14 mg/24hr patch Commonly known as:  NICODERM CQ - dosed in mg/24 hours Place 1 patch (14 mg total) onto the skin daily.   oxyCODONE 5 MG  immediate release tablet Commonly known as:  ROXICODONE Take 1 tablet (5 mg total) by mouth every 6 (six) hours as needed for severe pain (ONLY IF PAIN IS NOT CONTROLLED WITH TRAMADL).   pantoprazole 40 MG tablet Commonly known as:  PROTONIX Take 40 mg by mouth daily.   predniSONE 10 MG tablet Commonly known as:  DELTASONE Take 4 tablets (40 mg) daily for 2 days, then, Take 3 tablets (30 mg) daily for 2 days, then, Take 2 tablets (20 mg) daily for 2 days, then, Take 1 tablets (10 mg) daily for 1 days, then stop   primidone 250 MG tablet Commonly known as:  MYSOLINE Take 250 mg by mouth 2 (two) times daily.   PROBIOTIC ACIDOPHILUS PO Take 1 tablet by mouth daily.   traMADol 50 MG tablet Commonly known as:  ULTRAM Take 1 tablet (50 mg total) by mouth every 6 (six) hours as needed.       Contact information for follow-up providers    Wenda Low, MD. Schedule an appointment as soon as possible for a visit in 1 week(s).   Specialty:  Internal Medicine Contact information: [address] [place] [address] [phone]            Contact information for after-discharge care    Destination    [facility] .   Service:  Skilled Nursing Contact information: [address] [place] [phone]                 Allergies  Allergen Reactions  . Statins Other (See Comments)    Consultations:   None   Other Procedures/Studies: Dg Chest 2 View  Result Date: 06/07/2017 CLINICAL DATA:  83 year old female post fall with right greater than left chest/rib pain. Initial encounter. EXAM: CHEST - 2 VIEW COMPARISON:  05/05/2017 head CT.  02/10/2016 chest x-ray. FINDINGS: On prior exams patient was noted to have remote rib fractures with callus formation right fifth through seventh rib. Minimal cortical irregularity right sixth rib and therefore acute right sixth rib fracture cannot be excluded. Rib  detail films can be obtained for further delineation if clinically desired. No pneumothorax. PET CT detected pulmonary nodules not well delineated by plain film exam. 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 Heart size top-normal. Central pulmonary vascular prominence without pulmonary edema. Calcified aorta. No thoracic compression fracture noted. IMPRESSION: On prior exams patient was noted to have remote rib fractures with callus formation right fifth through seventh rib. Minimal cortical irregularity right sixth rib and therefore acute right sixth rib fracture cannot be excluded. Rib detail films can be obtained for further delineation if clinically desired. No pneumothorax. PET CT detected pulmonary nodules not well delineated by plain film exam. Substernal extension of goiter with  displacement trachea to left unchanged. Aortic Atherosclerosis (ICD10-I70.0). Electronically Signed   By: Genia Del M.D.   On: 06/07/2017 08:56   Dg Ribs Unilateral Right  Result Date: 06/07/2017 CLINICAL DATA:  Pain following fall EXAM: RIGHT RIBS - 2 VIEW COMPARISON:  Chest radiograph Jun 07, 2017 and chest CT April 22, 2017 FINDINGS: Oblique and cone-down rib images obtained. There are mildly displaced fractures of the lateral right fourth, fifth, and sixth ribs which appear recent/acute. No pneumothorax or pleural effusion evident on the right. No other rib lesions evident. Right-sided thyroid enlargement with displacement of the upper thoracic trachea toward the left again noted. IMPRESSION: Acute fractures with mild displacement of the lateral right fourth, fifth, and sixth ribs. No evident pneumothorax or pleural effusion on the right. Right lung clear. Enlarged thyroid on the right, documented previously. Electronically Signed   By: Lowella Grip III M.D.   On: 06/07/2017 10:48   Ct Head Wo Contrast  Result Date: 06/07/2017 CLINICAL DATA:  Intermittent vertigo. EXAM: CT HEAD  WITHOUT CONTRAST TECHNIQUE: Contiguous axial images were obtained from the base of the skull through the vertex without intravenous contrast. COMPARISON:  None. FINDINGS: Brain: No evidence of acute infarction, hemorrhage, hydrocephalus, extra-axial collection or mass lesion/mass effect. Mild age related cerebral atrophy. Scattered periventricular and subcortical white matter hypodensities are nonspecific, but favored to reflect chronic microvascular ischemic changes. Vascular: Atherosclerotic vascular calcification of the carotid siphons. No hyperdense vessel. Skull: Negative for fracture or focal lesion. Sinuses/Orbits: No acute finding. Other: None. IMPRESSION: 1.  No acute intracranial abnormality. 2. Mild age related cerebral atrophy and chronic microvascular ischemic changes. Electronically Signed   By: Titus Dubin M.D.   On: 06/07/2017 09:26   Ct Angio Chest Pe W And/or Wo Contrast  Result Date: 06/07/2017 CLINICAL DATA:  Shortness of breath.  Hypoxia and dyspnea EXAM: CT ANGIOGRAPHY CHEST WITH CONTRAST TECHNIQUE: Multidetector CT imaging of the chest was performed using the standard protocol during bolus administration of intravenous contrast. Multiplanar CT image reconstructions and MIPs were obtained to evaluate the vascular anatomy. CONTRAST:  100 cc Isovue 370 intravenous COMPARISON:  04/22/2017 and PET-CT 05/05/2017 FINDINGS: Cardiovascular:  Cardiomegaly without pericardial effusion. Mediastinum/Nodes: Multinodular goiter with tracheal deviation to the left, known. Negative for adenopathy Lungs/Pleura: Frothy appearing opacification of left lower lobe lobar and segmental airways. Similar appearance was seen on the most recent prior. Mild atelectasis at the bases. Calcified pulmonary nodule in the right lower lobe. 12 mm noncalcified pulmonary nodule in the left upper lobe, evaluated by PET CT last month. Left upper lobe ground-glass nodule is not discretely visualized today Upper Abdomen:  Cholelithiasis.  Colonic diverticulosis. Musculoskeletal: Lateral right fourth, fifth, sixth, and seventh ribs fractures without visible callus. A posterior right sixth rib is also fractured. Advanced bilateral glenohumeral osteoarthritis. Review of the MIP images confirms the above findings. IMPRESSION: 1. Acute lateral right fourth through seventh rib fractures. There is also a posterior right sixth rib fracture. 2. Left lower lobe airway debris with atelectasis. There is a background of generalized airway thickening. 3. Negative for pulmonary embolism. 4. Multinodular goiter. 5. 12 mm left upper lobe pulmonary nodule evaluated by PET CT last month. 6. Aortic Atherosclerosis (ICD10-I70.0). 7. Cholelithiasis. Electronically Signed   By: Monte Fantasia M.D.   On: 06/07/2017 14:56     TODAY-DAY OF DISCHARGE:  Subjective:   Kristen Mason today has no headache,no chest abdominal pain,no new weakness tingling or numbness-although better continues to have some pleuritic pain due  to her rib fractures.  Objective:   Blood pressure (!) 132/52, pulse (!) 43, temperature (!) 97.5 F (36.4 C), temperature source Oral, resp. rate 16, height 5\' 5"  (1.651 m), weight 70.8 kg (156 lb), SpO2 90 %.  Intake/Output Summary (Last 24 hours) at 06/11/2017 1011 Last data filed at 06/11/2017 0946 Gross per 24 hour  Intake 840 ml  Output 1000 ml  Net -160 ml   Filed Weights   06/07/17 0811  Weight: 70.8 kg (156 lb)    Exam: Awake Alert, Oriented *3, No new F.N deficits, Normal affect [place].AT,PERRAL Supple Neck,No JVD, No cervical lymphadenopathy appriciated.  Symmetrical Chest wall movement, Good air movement bilaterally, some scattered rhonchi RRR,No Gallops,Rubs or new Murmurs, No Parasternal Heave +ve B.Sounds, Abd Soft, Non tender, No organomegaly appriciated, No rebound -guarding or rigidity. No Cyanosis, Clubbing or edema, No new Rash or bruise   PERTINENT RADIOLOGIC STUDIES: Dg Chest 2  View  Result Date: 06/07/2017 CLINICAL DATA:  83 year old female post fall with right greater than left chest/rib pain. Initial encounter. EXAM: CHEST - 2 VIEW COMPARISON:  05/05/2017 head CT.  02/10/2016 chest x-ray. FINDINGS: On prior exams patient was noted to have remote rib fractures with callus formation right fifth through seventh rib. Minimal cortical irregularity right sixth rib and therefore acute right sixth rib fracture cannot be excluded. Rib detail films can be obtained for further delineation if clinically desired. No pneumothorax. PET CT detected pulmonary nodules not well delineated by plain film exam. 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 Heart size top-normal. Central pulmonary vascular prominence without pulmonary edema. Calcified aorta. No thoracic compression fracture noted. IMPRESSION: On prior exams patient was noted to have remote rib fractures with callus formation right fifth through seventh rib. Minimal cortical irregularity right sixth rib and therefore acute right sixth rib fracture cannot be excluded. Rib detail films can be obtained for further delineation if clinically desired. No pneumothorax. PET CT detected pulmonary nodules not well delineated by plain film exam. 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 Aortic Atherosclerosis (ICD10-I70.0). Electronically Signed   By: Genia Del M.D.   On: 06/07/2017 08:56   Dg Ribs Unilateral Right  Result Date: 06/07/2017 CLINICAL DATA:  Pain following fall EXAM: RIGHT RIBS - 2 VIEW COMPARISON:  Chest radiograph Jun 07, 2017 and chest CT April 22, 2017 FINDINGS: Oblique and cone-down rib images obtained. There are mildly displaced fractures of the lateral right fourth, fifth, and sixth ribs which appear recent/acute. No pneumothorax or pleural effusion evident on the right. No other rib lesions evident. Right-sided thyroid enlargement with displacement of the  upper thoracic trachea toward the left again noted. IMPRESSION: Acute fractures with mild displacement of the lateral right fourth, fifth, and sixth ribs. No evident pneumothorax or pleural effusion on the right. Right lung clear. Enlarged thyroid on the right, documented previously. Electronically Signed   By: Lowella Grip III M.D.   On: 06/07/2017 10:48   Ct Head Wo Contrast  Result Date: 06/07/2017 CLINICAL DATA:  Intermittent vertigo. EXAM: CT HEAD WITHOUT CONTRAST TECHNIQUE: Contiguous axial images were obtained from the base of the skull through the vertex without intravenous contrast. COMPARISON:  None. FINDINGS: Brain: No evidence of acute infarction, hemorrhage, hydrocephalus, extra-axial collection or mass lesion/mass effect. Mild age related cerebral atrophy. Scattered periventricular and subcortical white matter hypodensities are nonspecific, but favored to reflect chronic microvascular ischemic changes. Vascular: Atherosclerotic vascular calcification of the carotid siphons. No hyperdense vessel.  Skull: Negative for fracture or focal lesion. Sinuses/Orbits: No acute finding. Other: None. IMPRESSION: 1.  No acute intracranial abnormality. 2. Mild age related cerebral atrophy and chronic microvascular ischemic changes. Electronically Signed   By: Titus Dubin M.D.   On: 06/07/2017 09:26   Ct Angio Chest Pe W And/or Wo Contrast  Result Date: 06/07/2017 CLINICAL DATA:  Shortness of breath.  Hypoxia and dyspnea EXAM: CT ANGIOGRAPHY CHEST WITH CONTRAST TECHNIQUE: Multidetector CT imaging of the chest was performed using the standard protocol during bolus administration of intravenous contrast. Multiplanar CT image reconstructions and MIPs were obtained to evaluate the vascular anatomy. CONTRAST:  100 cc Isovue 370 intravenous COMPARISON:  04/22/2017 and PET-CT 05/05/2017 FINDINGS: Cardiovascular:  Cardiomegaly without pericardial effusion. Mediastinum/Nodes: Multinodular goiter with tracheal  deviation to the left, known. Negative for adenopathy Lungs/Pleura: Frothy appearing opacification of left lower lobe lobar and segmental airways. Similar appearance was seen on the most recent prior. Mild atelectasis at the bases. Calcified pulmonary nodule in the right lower lobe. 12 mm noncalcified pulmonary nodule in the left upper lobe, evaluated by PET CT last month. Left upper lobe ground-glass nodule is not discretely visualized today Upper Abdomen: Cholelithiasis.  Colonic diverticulosis. Musculoskeletal: Lateral right fourth, fifth, sixth, and seventh ribs fractures without visible callus. A posterior right sixth rib is also fractured. Advanced bilateral glenohumeral osteoarthritis. Review of the MIP images confirms the above findings. IMPRESSION: 1. Acute lateral right fourth through seventh rib fractures. There is also a posterior right sixth rib fracture. 2. Left lower lobe airway debris with atelectasis. There is a background of generalized airway thickening. 3. Negative for pulmonary embolism. 4. Multinodular goiter. 5. 12 mm left upper lobe pulmonary nodule evaluated by PET CT last month. 6. Aortic Atherosclerosis (ICD10-I70.0). 7. Cholelithiasis. Electronically Signed   By: Monte Fantasia M.D.   On: 06/07/2017 14:56     PERTINENT LAB RESULTS: CBC: No results for input(s): WBC, HGB, HCT, PLT in the last 72 hours. CMET CMP     Component Value Date/Time   NA 136 06/08/2017 0410   K 4.3 06/08/2017 0410   CL 98 (L) 06/08/2017 0410   CO2 28 06/08/2017 0410   GLUCOSE 145 (H) 06/08/2017 0410   BUN 19 06/08/2017 0410   CREATININE 0.96 06/08/2017 0410   CALCIUM 8.4 (L) 06/08/2017 0410   PROT 7.2 06/07/2017 0826   ALBUMIN 3.6 06/07/2017 0826   AST 17 06/07/2017 0826   ALT 6 (L) 06/07/2017 0826   ALKPHOS 114 06/07/2017 0826   BILITOT 0.5 06/07/2017 0826   GFRNONAA 54 (L) 06/08/2017 0410   GFRAA >60 06/08/2017 0410    GFR Estimated Creatinine Clearance: 46.1 mL/min (by C-G formula  based on SCr of 0.96 mg/dL). No results for input(s): LIPASE, AMYLASE in the last 72 hours. No results for input(s): CKTOTAL, CKMB, CKMBINDEX, TROPONINI in the last 72 hours. Invalid input(s): POCBNP No results for input(s): DDIMER in the last 72 hours. No results for input(s): HGBA1C in the last 72 hours. No results for input(s): CHOL, HDL, LDLCALC, TRIG, CHOLHDL, LDLDIRECT in the last 72 hours. No results for input(s): TSH, T4TOTAL, T3FREE, THYROIDAB in the last 72 hours.  Invalid input(s): FREET3 No results for input(s): VITAMINB12, FOLATE, FERRITIN, TIBC, IRON, RETICCTPCT in the last 72 hours. Coags: No results for input(s): INR in the last 72 hours.  Invalid input(s): PT Microbiology: No results found for this or any previous visit (from the past 240 hour(s)).  FURTHER DISCHARGE INSTRUCTIONS:  Get Medicines reviewed  and adjusted: Please take all your medications with you for your next visit with your Primary MD  Laboratory/radiological data: Please request your Primary MD to go over all hospital tests and procedure/radiological results at the follow up, please ask your Primary MD to get all Hospital records sent to his/her office.  In some cases, they will be blood work, cultures and biopsy results pending at the time of your discharge. Please request that your primary care M.D. goes through all the records of your hospital data and follows up on these results.  Also Note the following: If you experience worsening of your admission symptoms, develop shortness of breath, life threatening emergency, suicidal or homicidal thoughts you must seek medical attention immediately by calling 911 or calling your MD immediately  if symptoms less severe.  You must read complete instructions/literature along with all the possible adverse reactions/side effects for all the Medicines you take and that have been prescribed to you. Take any new Medicines after you have completely understood and  accpet all the possible adverse reactions/side effects.   Do not drive when taking Pain medications or sleeping medications (Benzodaizepines)  Do not take more than prescribed Pain, Sleep and Anxiety Medications. It is not advisable to combine anxiety,sleep and pain medications without talking with your primary care practitioner  Special Instructions: If you have smoked or chewed Tobacco  in the last 2 yrs please stop smoking, stop any regular Alcohol  and or any Recreational drug use.  Wear Seat belts while driving.  Please note: You were cared for by a hospitalist during your hospital stay. Once you are discharged, your primary care physician will handle any further medical issues. Please note that NO REFILLS for any discharge medications will be authorized once you are discharged, as it is imperative that you return to your primary care physician (or establish a relationship with a primary care physician if you do not have one) for your post hospital discharge needs so that they can reassess your need for medications and monitor your lab values.  Total Time spent coordinating discharge including counseling, education and face to face time equals 45 minutes.  SignedOren Binet 06/11/2017 10:11 AM

## 2017-06-11 NOTE — Progress Notes (Signed)
Pt is discharged to [facility] facility via [facility].  Instructions given to daugher and sent to SNF.  Report given to [facility] RN.

## 2017-06-14 ENCOUNTER — Encounter: Payer: Self-pay | Admitting: Internal Medicine

## 2017-06-14 ENCOUNTER — Non-Acute Institutional Stay (SKILLED_NURSING_FACILITY): Payer: Medicare Other | Admitting: Internal Medicine

## 2017-06-14 DIAGNOSIS — F419 Anxiety disorder, unspecified: Secondary | ICD-10-CM | POA: Diagnosis not present

## 2017-06-14 DIAGNOSIS — E1142 Type 2 diabetes mellitus with diabetic polyneuropathy: Secondary | ICD-10-CM

## 2017-06-14 DIAGNOSIS — F329 Major depressive disorder, single episode, unspecified: Secondary | ICD-10-CM

## 2017-06-14 DIAGNOSIS — G20 Parkinson's disease: Secondary | ICD-10-CM

## 2017-06-14 DIAGNOSIS — F32A Depression, unspecified: Secondary | ICD-10-CM

## 2017-06-14 DIAGNOSIS — I10 Essential (primary) hypertension: Secondary | ICD-10-CM

## 2017-06-14 DIAGNOSIS — S2241XD Multiple fractures of ribs, right side, subsequent encounter for fracture with routine healing: Secondary | ICD-10-CM | POA: Diagnosis not present

## 2017-06-14 DIAGNOSIS — J9601 Acute respiratory failure with hypoxia: Secondary | ICD-10-CM | POA: Diagnosis not present

## 2017-06-14 DIAGNOSIS — G250 Essential tremor: Secondary | ICD-10-CM | POA: Diagnosis not present

## 2017-06-14 DIAGNOSIS — G63 Polyneuropathy in diseases classified elsewhere: Secondary | ICD-10-CM | POA: Diagnosis not present

## 2017-06-14 DIAGNOSIS — F17200 Nicotine dependence, unspecified, uncomplicated: Secondary | ICD-10-CM | POA: Diagnosis not present

## 2017-06-14 DIAGNOSIS — J441 Chronic obstructive pulmonary disease with (acute) exacerbation: Secondary | ICD-10-CM

## 2017-06-14 LAB — GLUCOSE, CAPILLARY: GLUCOSE-CAPILLARY: 106 mg/dL — AB (ref 65–99)

## 2017-06-14 NOTE — Progress Notes (Signed)
: Provider:  Inocencio Homes, M.D. Location:  [place] Room Number: 106 Place of Service:  SNF (31)  PCP: Wenda Low, MD Patient Care Team: Wenda Low, MD as PCP - General (Internal Medicine)  Extended Emergency Contact Information Primary Emergency Contact: [place] Phone: [phone] Relation: Daughter     Allergies: Statins  Chief Complaint  Patient presents with  . New Admit To SNF    New Admission to [facility]     HPI: Patient is 83 y.o. female with hypertension, diabetes mellitus, GERD, who presented to [facility] emergency department with a fall.  Patient says she got up out of bed grabbed onto her walker and started walking, then all of a sudden fell.  She had been dizzy, had fallen 3 times.  The dizzy episodes everything is spinning.  She has had to have oxygen during prior hospitalizations but otherwise has not been known to have COPD or O2 dependence.  She is a smoker.  She denies that she is short of breath her daughter feels like she has had episodic breathing heavily at home worse over the last few weeks, chronic cough productive of sputum.  Patient was admitted to [facility] from 5/13-17 where she was diagnosed and treated for acute hypoxic respiratory failure secondary to COPD exacerbation, first incidentAs well as falls with right-sided rib fracture.  Patient was treated with O2 steroids and bronchodilators with improvement.  Patient is admitted to skilled nursing facility for OT/PT.  While at skilled nursing facility patient will be followed for diabetes mellitus type 2 treated with low-carb diet, hypertension treated with Norvasc and lisinopril, Parkinson's disease treated with Sinemet and primidone.  Past Medical History:  Diagnosis Date  . Diabetes ([place])   . Essential tremor   . Fall 06/07/2017   rib fractures  . GERD (gastroesophageal reflux disease)   . Hypertension   .  Peripheral neuropathy     Past Surgical History:  Procedure Laterality Date  . ABDOMINAL HYSTERECTOMY  1975  . BREAST LUMPECTOMY Right    non cancerous    Allergies as of 06/14/2017      Reactions   Statins Other (See Comments)      Medication List        Accurate as of 06/14/17 10:22 AM. Always use your most recent med list.          acetaminophen 500 MG tablet Commonly known as:  TYLENOL Take 2 tablets (1,000 mg total) by mouth every 8 (eight) hours. For 5 more days   albuterol (2.5 MG/3ML) 0.083% nebulizer solution Commonly known as:  PROVENTIL Take 3 mLs (2.5 mg total) by nebulization every 4 (four) hours as needed for shortness of breath.   amLODipine 10 MG tablet Commonly known as:  NORVASC Take 10 mg by mouth daily.   aspirin 81 MG tablet Take 81 mg by mouth daily.   carbidopa-levodopa 25-100 MG tablet Commonly known as:  SINEMET IR Take 1 tablet by mouth 3 (three) times daily.   cetirizine 10 MG tablet Commonly known as:  ZYRTEC Take 10 mg by mouth daily.   citalopram 40 MG tablet Commonly known as:  CELEXA Take 40 mg by mouth daily.   clonazePAM 1 MG tablet Commonly known as:  KLONOPIN Take 1 tablet (1 mg total) by mouth at bedtime.   dicyclomine 10 MG capsule Commonly known as:  BENTYL Take 10 mg by mouth 4 (four) times daily -  before meals and at bedtime.   diphenhydrAMINE 25 MG tablet Commonly known as:  SOMINEX Take 25 mg by mouth at bedtime as needed for sleep.   feeding supplement (GLUCERNA SHAKE) Liqd Take 237 mLs by mouth 2 (two) times daily between meals.   Fish Oil 1000 MG Cpdr Take 1,000 mg by mouth daily.   gabapentin 800 MG tablet Commonly known as:  NEURONTIN Take 800 mg by mouth 3 (three) times daily.   guaiFENesin 600 MG 12 hr tablet Commonly known as:  MUCINEX Take 1 tablet (600 mg total) by mouth 2 (two) times daily.   hydrOXYzine 25 MG tablet Commonly known as:  ATARAX/VISTARIL Take 25 mg by mouth at bedtime.     ipratropium-albuterol 0.5-2.5 (3) MG/3ML Soln Commonly known as:  DUONEB Take 3 mLs by nebulization every 6 (six) hours.   lidocaine 5 % Commonly known as:  LIDODERM Place 1 patch onto the skin daily. Remove & Discard patch within 12 hours or as directed by MD   lisinopril 20 MG tablet Commonly known as:  PRINIVIL,ZESTRIL Take 20 mg by mouth daily.   nicotine 14 mg/24hr patch Commonly known as:  NICODERM CQ - dosed in mg/24 hours Place 1 patch (14 mg total) onto the skin daily.   oxyCODONE 5 MG immediate release tablet Commonly known as:  ROXICODONE Take 1 tablet (5 mg total) by mouth every 6 (six) hours as needed for severe pain (ONLY IF PAIN IS NOT CONTROLLED WITH TRAMADL).   pantoprazole 40 MG tablet Commonly known as:  PROTONIX Take 40 mg by mouth daily.   predniSONE 10 MG tablet Commonly known as:  DELTASONE Take 4 tablets (40 mg) daily for 2 days, then, Take 3 tablets (30 mg) daily for 2 days, then, Take 2 tablets (20 mg) daily for 2 days, then, Take 1 tablets (10 mg) daily for 1 days, then stop   primidone 250 MG tablet Commonly known as:  MYSOLINE Take 250 mg by mouth 2 (two) times daily.   PROBIOTIC ACIDOPHILUS PO Take 1 tablet by mouth daily.   traMADol 50 MG tablet Commonly known as:  ULTRAM Take 1 tablet (50 mg total) by mouth every 6 (six) hours as needed.       No orders of the defined types were placed in this encounter.   Immunization History  Administered Date(s) Administered  . Influenza-Unspecified 03/08/2017  . Pneumococcal-Unspecified 03/08/2017    Social History   Tobacco Use  . Smoking status: Current Every Day Smoker    Packs/day: 1.00    Years: 62.00    Pack years: 62.00    Types: Cigarettes  . Smokeless tobacco: Current User  Substance Use Topics  . Alcohol use: No    Alcohol/week: 0.0 oz    Family history is dad with MI mom GI bleed and brother CVA  History reviewed. No pertinent family history.    Review of  Systems  DATA OBTAINED: from patient, nurse, medical record, family member GENERAL:  no fevers, fatigue, appetite changes SKIN: No itching, or rash EYES: No eye pain, redness, discharge EARS: No earache, tinnitus, change in hearing NOSE: No congestion, drainage or bleeding  MOUTH/THROAT: No mouth or tooth pain, No sore throat RESPIRATORY: No cough, wheezing, SOB CARDIAC: No chest pain, palpitations, lower extremity edema  GI: No abdominal pain, No N/V/D or constipation, No heartburn or reflux  GU: No dysuria, frequency or urgency, or incontinence  MUSCULOSKELETAL: +unrelieved bone/joint pain; patient would like to have her pain medicine  every 4 hours scheduled NEUROLOGIC: No headache, dizziness or focal weakness PSYCHIATRIC: No c/o anxiety or sadness   Vitals:   06/14/17 1007  BP: (!) 125/59  Pulse: 63  Temp: (!) 97.3 F (36.3 C)  SpO2: 99%    SpO2 Readings from Last 1 Encounters:  06/14/17 99%   There is no height or weight on file to calculate BMI.     Physical Exam  GENERAL APPEARANCE: Alert, conversant,  No acute distress.  SKIN: No diaphoresis rash HEAD: Normocephalic, atraumatic  EYES: Conjunctiva/lids clear. Pupils round, reactive. EOMs intact.  EARS: External exam WNL, canals clear. Hearing grossly normal.  NOSE: No deformity or discharge.  MOUTH/THROAT: Lips w/o lesions  RESPIRATORY: Breathing is even, unlabored. Lung sounds are clear   CARDIOVASCULAR: Heart RRR no murmurs, rubs or gallops. No peripheral edema.   GASTROINTESTINAL: Abdomen is soft, non-tender, not distended w/ normal bowel sounds. GENITOURINARY: Bladder non tender, not distended  MUSCULOSKELETAL: No abnormal joints or musculature NEUROLOGIC:  Cranial nerves 2-12 grossly intact. Moves all extremities; no tremor noted PSYCHIATRIC: Mood and affect appropriate to situation, no behavioral issues  Patient Active Problem List   Diagnosis Date Noted  . Acute respiratory failure with hypoxia ([place])  06/07/2017  . Fall at home, initial encounter 06/07/2017  . Multiple rib fractures 06/07/2017  . Controlled diabetes mellitus ([place]) 06/07/2017  . Essential hypertension 06/07/2017  . Tobacco dependence 06/07/2017      Labs reviewed: Basic Metabolic Panel:    Component Value Date/Time   NA 136 06/08/2017 0410   K 4.3 06/08/2017 0410   CL 98 (L) 06/08/2017 0410   CO2 28 06/08/2017 0410   GLUCOSE 145 (H) 06/08/2017 0410   BUN 19 06/08/2017 0410   CREATININE 0.96 06/08/2017 0410   CALCIUM 8.4 (L) 06/08/2017 0410   PROT 7.2 06/07/2017 0826   ALBUMIN 3.6 06/07/2017 0826   AST 17 06/07/2017 0826   ALT 6 (L) 06/07/2017 0826   ALKPHOS 114 06/07/2017 0826   BILITOT 0.5 06/07/2017 0826   GFRNONAA 54 (L) 06/08/2017 0410   GFRAA >60 06/08/2017 0410    Recent Labs    06/07/17 0826 06/08/17 0410  NA 139 136  K 4.5 4.3  CL 104 98*  CO2 27 28  GLUCOSE 165* 145*  BUN 14 19  CREATININE 1.00 0.96  CALCIUM 8.7* 8.4*   Liver Function Tests: Recent Labs    06/07/17 0826  AST 17  ALT 6*  ALKPHOS 114  BILITOT 0.5  PROT 7.2  ALBUMIN 3.6   No results for input(s): LIPASE, AMYLASE in the last 8760 hours. No results for input(s): AMMONIA in the last 8760 hours. CBC: Recent Labs    06/07/17 0826 06/08/17 0410  WBC 4.7 6.5  NEUTROABS 3.3  --   HGB 12.4 11.9*  HCT 39.9 38.1  MCV 102.0* 101.6*  PLT 158 163   Lipid No results for input(s): CHOL, HDL, LDLCALC, TRIG in the last 8760 hours.  Cardiac Enzymes: No results for input(s): CKTOTAL, CKMB, CKMBINDEX, TROPONINI in the last 8760 hours. BNP: Recent Labs    06/07/17 0834  BNP 113.6*   No results found for: [facility] Lab Results  Component Value Date   HGBA1C 6.6 (H) 06/07/2017   No results found for: TSH No results found for: VITAMINB12 No results found for: FOLATE No results found for: IRON, TIBC, FERRITIN  Imaging and Procedures obtained prior to SNF admission: Dg Chest 2 View  Result Date:  06/07/2017 CLINICAL DATA:  83 year old  female post fall with right greater than left chest/rib pain. Initial encounter. EXAM: CHEST - 2 VIEW COMPARISON:  05/05/2017 head CT.  02/10/2016 chest x-ray. FINDINGS: On prior exams patient was noted to have remote rib fractures with callus formation right fifth through seventh rib. Minimal cortical irregularity right sixth rib and therefore acute right sixth rib fracture cannot be excluded. Rib detail films can be obtained for further delineation if clinically desired. No pneumothorax. PET CT detected pulmonary nodules not well delineated by plain film exam. 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 Heart size top-normal. Central pulmonary vascular prominence without pulmonary edema. Calcified aorta. No thoracic compression fracture noted. IMPRESSION: On prior exams patient was noted to have remote rib fractures with callus formation right fifth through seventh rib. Minimal cortical irregularity right sixth rib and therefore acute right sixth rib fracture cannot be excluded. Rib detail films can be obtained for further delineation if clinically desired. No pneumothorax. PET CT detected pulmonary nodules not well delineated by plain film exam. 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 Aortic Atherosclerosis (ICD10-I70.0). Electronically Signed   By: Genia Del M.D.   On: 06/07/2017 08:56   Dg Ribs Unilateral Right  Result Date: 06/07/2017 CLINICAL DATA:  Pain following fall EXAM: RIGHT RIBS - 2 VIEW COMPARISON:  Chest radiograph Jun 07, 2017 and chest CT April 22, 2017 FINDINGS: Oblique and cone-down rib images obtained. There are mildly displaced fractures of the lateral right fourth, fifth, and sixth ribs which appear recent/acute. No pneumothorax or pleural effusion evident on the right. No other rib lesions evident. Right-sided thyroid enlargement with displacement of the upper thoracic trachea  toward the left again noted. IMPRESSION: Acute fractures with mild displacement of the lateral right fourth, fifth, and sixth ribs. No evident pneumothorax or pleural effusion on the right. Right lung clear. Enlarged thyroid on the right, documented previously. Electronically Signed   By: Lowella Grip III M.D.   On: 06/07/2017 10:48   Ct Head Wo Contrast  Result Date: 06/07/2017 CLINICAL DATA:  Intermittent vertigo. EXAM: CT HEAD WITHOUT CONTRAST TECHNIQUE: Contiguous axial images were obtained from the base of the skull through the vertex without intravenous contrast. COMPARISON:  None. FINDINGS: Brain: No evidence of acute infarction, hemorrhage, hydrocephalus, extra-axial collection or mass lesion/mass effect. Mild age related cerebral atrophy. Scattered periventricular and subcortical white matter hypodensities are nonspecific, but favored to reflect chronic microvascular ischemic changes. Vascular: Atherosclerotic vascular calcification of the carotid siphons. No hyperdense vessel. Skull: Negative for fracture or focal lesion. Sinuses/Orbits: No acute finding. Other: None. IMPRESSION: 1.  No acute intracranial abnormality. 2. Mild age related cerebral atrophy and chronic microvascular ischemic changes. Electronically Signed   By: Titus Dubin M.D.   On: 06/07/2017 09:26   Ct Angio Chest Pe W And/or Wo Contrast  Result Date: 06/07/2017 CLINICAL DATA:  Shortness of breath.  Hypoxia and dyspnea EXAM: CT ANGIOGRAPHY CHEST WITH CONTRAST TECHNIQUE: Multidetector CT imaging of the chest was performed using the standard protocol during bolus administration of intravenous contrast. Multiplanar CT image reconstructions and MIPs were obtained to evaluate the vascular anatomy. CONTRAST:  100 cc Isovue 370 intravenous COMPARISON:  04/22/2017 and PET-CT 05/05/2017 FINDINGS: Cardiovascular:  Cardiomegaly without pericardial effusion. Mediastinum/Nodes: Multinodular goiter with tracheal deviation to the left,  known. Negative for adenopathy Lungs/Pleura: Frothy appearing opacification of left lower lobe lobar and segmental airways. Similar appearance was seen on the most recent prior. Mild atelectasis at the bases. Calcified pulmonary  nodule in the right lower lobe. 12 mm noncalcified pulmonary nodule in the left upper lobe, evaluated by PET CT last month. Left upper lobe ground-glass nodule is not discretely visualized today Upper Abdomen: Cholelithiasis.  Colonic diverticulosis. Musculoskeletal: Lateral right fourth, fifth, sixth, and seventh ribs fractures without visible callus. A posterior right sixth rib is also fractured. Advanced bilateral glenohumeral osteoarthritis. Review of the MIP images confirms the above findings. IMPRESSION: 1. Acute lateral right fourth through seventh rib fractures. There is also a posterior right sixth rib fracture. 2. Left lower lobe airway debris with atelectasis. There is a background of generalized airway thickening. 3. Negative for pulmonary embolism. 4. Multinodular goiter. 5. 12 mm left upper lobe pulmonary nodule evaluated by PET CT last month. 6. Aortic Atherosclerosis (ICD10-I70.0). 7. Cholelithiasis. Electronically Signed   By: Monte Fantasia M.D.   On: 06/07/2017 14:56     Not all labs, radiology exams or other studies done during hospitalization come through on my EPIC note; however they are reviewed by me.    Assessment and Plan  Acute respiratory failure with hypoxia/COPD exacerbation/right rib fracture-new onset, long history of tobacco use; improved with steroids and bronchodilators; CT angiogram of chest negative for PE; exacerbated by right rib fracture SNF -admitted for OT/PT; continue prednisone taper for next 7 days; duo nebs every 6 hours, Mucinex 600 twice daily and as needed albuterol; will schedule Norco 5/325 1 p.o. every 4 for 2 weeks for rib pain  Tobacco abuse-patient started on nicotine patch 14 mg / 24 hours SNF -continue nicotine patch 14  mg / 24 hours daily  Diabetes mellitus type 2 SNF -discharge summary said to put patient back on oral hypoglycemics, however she was never on oral hypoglycemics at home, diet only with hemoglobin A1c of 6.6; chosen to follow patient's blood sugars for several weeks- predict that once prednisone is done patient's blood sugars will go back to normal; patient is on ACE, not on statin  Hypertension SNF -controlled; continue Norvasc 10 mg daily and lisinopril 20 mg daily  Parkinson's disease/essential tremor SNF -continue Sinemet 25-101 p.o. 3 times daily and primidone 250 mg twice daily for essential tremor  Peripheral neuropath SNF = continue 800 mg 3 times daily  Depression SNF -continue Celexa 40 mg daily  Anxiety SNF -continue clonazepam 1 mg p.o. nightly   Time spent greater than 45 minutes;> 50% of time with patient was spent reviewing records, labs, tests and studies, counseling and developing plan of care  Inocencio Homes, M.D.

## 2017-06-16 ENCOUNTER — Encounter: Payer: Self-pay | Admitting: Internal Medicine

## 2017-06-16 DIAGNOSIS — G629 Polyneuropathy, unspecified: Secondary | ICD-10-CM | POA: Insufficient documentation

## 2017-06-16 DIAGNOSIS — F329 Major depressive disorder, single episode, unspecified: Secondary | ICD-10-CM | POA: Insufficient documentation

## 2017-06-16 DIAGNOSIS — F32A Depression, unspecified: Secondary | ICD-10-CM | POA: Insufficient documentation

## 2017-06-16 DIAGNOSIS — F419 Anxiety disorder, unspecified: Secondary | ICD-10-CM | POA: Insufficient documentation

## 2017-06-16 DIAGNOSIS — G20 Parkinson's disease: Secondary | ICD-10-CM | POA: Insufficient documentation

## 2017-06-16 DIAGNOSIS — J441 Chronic obstructive pulmonary disease with (acute) exacerbation: Secondary | ICD-10-CM | POA: Insufficient documentation

## 2017-06-16 DIAGNOSIS — G250 Essential tremor: Secondary | ICD-10-CM | POA: Insufficient documentation

## 2017-06-26 DIAGNOSIS — F17200 Nicotine dependence, unspecified, uncomplicated: Secondary | ICD-10-CM | POA: Diagnosis not present

## 2017-06-26 DIAGNOSIS — K219 Gastro-esophageal reflux disease without esophagitis: Secondary | ICD-10-CM | POA: Diagnosis not present

## 2017-06-26 DIAGNOSIS — G20 Parkinson's disease: Secondary | ICD-10-CM | POA: Diagnosis not present

## 2017-06-26 DIAGNOSIS — Z9181 History of falling: Secondary | ICD-10-CM | POA: Diagnosis not present

## 2017-06-26 DIAGNOSIS — G63 Polyneuropathy in diseases classified elsewhere: Secondary | ICD-10-CM | POA: Diagnosis not present

## 2017-06-26 DIAGNOSIS — J441 Chronic obstructive pulmonary disease with (acute) exacerbation: Secondary | ICD-10-CM | POA: Diagnosis not present

## 2017-06-26 DIAGNOSIS — I10 Essential (primary) hypertension: Secondary | ICD-10-CM | POA: Diagnosis not present

## 2017-06-26 DIAGNOSIS — R2689 Other abnormalities of gait and mobility: Secondary | ICD-10-CM | POA: Diagnosis not present

## 2017-06-26 DIAGNOSIS — F419 Anxiety disorder, unspecified: Secondary | ICD-10-CM | POA: Diagnosis not present

## 2017-06-26 DIAGNOSIS — E1142 Type 2 diabetes mellitus with diabetic polyneuropathy: Secondary | ICD-10-CM | POA: Diagnosis not present

## 2017-06-26 DIAGNOSIS — E119 Type 2 diabetes mellitus without complications: Secondary | ICD-10-CM | POA: Diagnosis not present

## 2017-06-26 DIAGNOSIS — G250 Essential tremor: Secondary | ICD-10-CM | POA: Diagnosis not present

## 2017-06-26 DIAGNOSIS — J9601 Acute respiratory failure with hypoxia: Secondary | ICD-10-CM | POA: Diagnosis not present

## 2017-06-26 DIAGNOSIS — G629 Polyneuropathy, unspecified: Secondary | ICD-10-CM | POA: Diagnosis not present

## 2017-06-26 DIAGNOSIS — F329 Major depressive disorder, single episode, unspecified: Secondary | ICD-10-CM | POA: Diagnosis not present

## 2017-06-26 DIAGNOSIS — W19XXXD Unspecified fall, subsequent encounter: Secondary | ICD-10-CM | POA: Diagnosis not present

## 2017-06-26 DIAGNOSIS — M6281 Muscle weakness (generalized): Secondary | ICD-10-CM | POA: Diagnosis not present

## 2017-06-26 DIAGNOSIS — S2241XD Multiple fractures of ribs, right side, subsequent encounter for fracture with routine healing: Secondary | ICD-10-CM | POA: Diagnosis not present

## 2017-07-06 ENCOUNTER — Encounter: Payer: Self-pay | Admitting: Internal Medicine

## 2017-07-06 NOTE — Progress Notes (Signed)
Location:  [facility] Room Number: 106-P Place of Service:  SNF [phone])  Kristen Duos, MD  Patient Care Team: Wenda Low, MD as PCP - General (Internal Medicine)  Extended Emergency Contact Information Primary Emergency Contact: [place] Phone: [phone] Relation: Daughter    Allergies: Statins  Chief Complaint  Patient presents with  . Medical Management of Chronic Issues    Routine Visit    HPI: Patient is 83 y.o. female who   Past Medical History:  Diagnosis Date  . Diabetes ([place])   . Essential tremor   . Fall 06/07/2017   rib fractures  . GERD (gastroesophageal reflux disease)   . Hypertension   . Peripheral neuropathy     Past Surgical History:  Procedure Laterality Date  . ABDOMINAL HYSTERECTOMY  1975  . BREAST LUMPECTOMY Right    non cancerous    Allergies as of 07/06/2017      Reactions   Statins Other (See Comments)      Medication List        Accurate as of 07/06/17 12:55 PM. Always use your most recent med list.          albuterol (2.5 MG/3ML) 0.083% nebulizer solution Commonly known as:  PROVENTIL Take 3 mLs (2.5 mg total) by nebulization every 4 (four) hours as needed for shortness of breath.   aspirin 81 MG tablet Take 81 mg by mouth daily.   carbidopa-levodopa 25-100 MG tablet Commonly known as:  SINEMET IR Take 1 tablet by mouth 3 (three) times daily.   cetirizine 10 MG tablet Commonly known as:  ZYRTEC Take 10 mg by mouth daily.   citalopram 40 MG tablet Commonly known as:  CELEXA Take 40 mg by mouth daily.   clonazePAM 1 MG tablet Commonly known as:  KLONOPIN Take 1 tablet (1 mg total) by mouth at bedtime.   dicyclomine 10 MG capsule Commonly known as:  BENTYL Take 10 mg by mouth 4 (four) times daily -  before meals and at bedtime.   diphenhydrAMINE 25 MG tablet Commonly known as:  SOMINEX Take 25 mg by mouth at bedtime as needed for  sleep.   feeding supplement (GLUCERNA SHAKE) Liqd Take 237 mLs by mouth 2 (two) times daily between meals.   Fish Oil 1000 MG Cpdr Take 1,000 mg by mouth daily.   gabapentin 800 MG tablet Commonly known as:  NEURONTIN Take 800 mg by mouth 3 (three) times daily.   guaiFENesin 600 MG 12 hr tablet Commonly known as:  MUCINEX Take 1 tablet (600 mg total) by mouth 2 (two) times daily.   hydrOXYzine 25 MG tablet Commonly known as:  ATARAX/VISTARIL Take 25 mg by mouth at bedtime.   ipratropium-albuterol 0.5-2.5 (3) MG/3ML Soln Commonly known as:  DUONEB Take 3 mLs by nebulization every 6 (six) hours.   lidocaine 5 % Commonly known as:  LIDODERM Place 1 patch onto the skin daily. Remove & Discard patch within 12 hours or as directed by MD   lisinopril 2.5 MG tablet Commonly known as:  PRINIVIL,ZESTRIL Take 2.5 mg by mouth daily. take 1 tab po daily for htn   nicotine 14 mg/24hr patch Commonly known as:  NICODERM CQ - dosed in mg/24 hours Place 1 patch (14 mg total) onto the skin daily.   oxyCODONE 5 MG immediate release tablet Commonly known as:  ROXICODONE Take 1 tablet (5 mg total) by mouth every 6 (six) hours as  needed for severe pain (ONLY IF PAIN IS NOT CONTROLLED WITH TRAMADL).   pantoprazole 40 MG tablet Commonly known as:  PROTONIX Take 40 mg by mouth daily.   primidone 250 MG tablet Commonly known as:  MYSOLINE Take 250 mg by mouth 2 (two) times daily.   PROBIOTIC ACIDOPHILUS PO Take 1 tablet by mouth daily.   traMADol 50 MG tablet Commonly known as:  ULTRAM Take 1 tablet (50 mg total) by mouth every 6 (six) hours as needed.       No orders of the defined types were placed in this encounter.   Immunization History  Administered Date(s) Administered  . Influenza-Unspecified 03/08/2017  . Pneumococcal-Unspecified 03/08/2017    Social History   Tobacco Use  . Smoking status: Current Every Day Smoker    Packs/day: 1.00    Years: 62.00    Pack  years: 62.00    Types: Cigarettes  . Smokeless tobacco: Current User  Substance Use Topics  . Alcohol use: No    Alcohol/week: 0.0 oz    Review of Systems  DATA OBTAINED: from patient, nurse, medical record, family member GENERAL:  no fevers, fatigue, appetite changes SKIN: No itching, rash HEENT: No complaint RESPIRATORY: No cough, wheezing, SOB CARDIAC: No chest pain, palpitations, lower extremity edema  GI: No abdominal pain, No N/V/D or constipation, No heartburn or reflux  GU: No dysuria, frequency or urgency, or incontinence  MUSCULOSKELETAL: No unrelieved bone/joint pain NEUROLOGIC: No headache, dizziness  PSYCHIATRIC: No overt anxiety or sadness  Vitals:   07/06/17 1221  BP: 124/74  Pulse: 78  Resp: 18  Temp: 98.1 F (36.7 C)  SpO2: 97%   Body mass index is 25.96 kg/m. Physical Exam  GENERAL APPEARANCE: Alert, conversant, No acute distress  SKIN: No diaphoresis rash HEENT: Unremarkable RESPIRATORY: Breathing is even, unlabored. Lung sounds are clear   CARDIOVASCULAR: Heart RRR no murmurs, rubs or gallops. No peripheral edema  GASTROINTESTINAL: Abdomen is soft, non-tender, not distended w/ normal bowel sounds.  GENITOURINARY: Bladder non tender, not distended  MUSCULOSKELETAL: No abnormal joints or musculature NEUROLOGIC: Cranial nerves 2-12 grossly intact. Moves all extremities PSYCHIATRIC: Mood and affect appropriate to situation, no behavioral issues  Patient Active Problem List   Diagnosis Date Noted  . COPD exacerbation ([place]) 06/16/2017  . Parkinson's disease ([place]) 06/16/2017  . Essential tremor 06/16/2017  . Peripheral neuropathy 06/16/2017  . Depression 06/16/2017  . Anxiety 06/16/2017  . Acute respiratory failure with hypoxia ([place]) 06/07/2017  . Fall at home, initial encounter 06/07/2017  . Multiple rib fractures 06/07/2017  . Controlled diabetes mellitus ([place]) 06/07/2017  . Essential hypertension 06/07/2017  . Tobacco dependence 06/07/2017     CMP     Component Value Date/Time   NA 136 06/08/2017 0410   K 4.3 06/08/2017 0410   CL 98 (L) 06/08/2017 0410   CO2 28 06/08/2017 0410   GLUCOSE 145 (H) 06/08/2017 0410   BUN 19 06/08/2017 0410   CREATININE 0.96 06/08/2017 0410   CALCIUM 8.4 (L) 06/08/2017 0410   PROT 7.2 06/07/2017 0826   ALBUMIN 3.6 06/07/2017 0826   AST 17 06/07/2017 0826   ALT 6 (L) 06/07/2017 0826   ALKPHOS 114 06/07/2017 0826   BILITOT 0.5 06/07/2017 0826   GFRNONAA 54 (L) 06/08/2017 0410   GFRAA >60 06/08/2017 0410   Recent Labs    06/07/17 0826 06/08/17 0410  NA 139 136  K 4.5 4.3  CL 104 98*  CO2 27 28  GLUCOSE 165* 145*  BUN 14 19  CREATININE 1.00 0.96  CALCIUM 8.7* 8.4*   Recent Labs    06/07/17 0826  AST 17  ALT 6*  ALKPHOS 114  BILITOT 0.5  PROT 7.2  ALBUMIN 3.6   Recent Labs    06/07/17 0826 06/08/17 0410  WBC 4.7 6.5  NEUTROABS 3.3  --   HGB 12.4 11.9*  HCT 39.9 38.1  MCV 102.0* 101.6*  PLT 158 163   No results for input(s): CHOL, LDLCALC, TRIG in the last 8760 hours.  Invalid input(s): HCL No results found for: MICROALBUR No results found for: TSH Lab Results  Component Value Date   HGBA1C 6.6 (H) 06/07/2017   No results found for: CHOL, HDL, LDLCALC, LDLDIRECT, TRIG, CHOLHDL  Significant Diagnostic Results in last 30 days:  Dg Chest 2 View  Result Date: 06/07/2017 CLINICAL DATA:  83 year old female post fall with right greater than left chest/rib pain. Initial encounter. EXAM: CHEST - 2 VIEW COMPARISON:  05/05/2017 head CT.  02/10/2016 chest x-ray. FINDINGS: On prior exams patient was noted to have remote rib fractures with callus formation right fifth through seventh rib. Minimal cortical irregularity right sixth rib and therefore acute right sixth rib fracture cannot be excluded. Rib detail films can be obtained for further delineation if clinically desired. No pneumothorax. PET CT detected pulmonary nodules not well delineated by plain film exam.  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 Heart size top-normal. Central pulmonary vascular prominence without pulmonary edema. Calcified aorta. No thoracic compression fracture noted. IMPRESSION: On prior exams patient was noted to have remote rib fractures with callus formation right fifth through seventh rib. Minimal cortical irregularity right sixth rib and therefore acute right sixth rib fracture cannot be excluded. Rib detail films can be obtained for further delineation if clinically desired. No pneumothorax. PET CT detected pulmonary nodules not well delineated by plain film exam. 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 Aortic Atherosclerosis (ICD10-I70.0). Electronically Signed   By: Genia Del M.D.   On: 06/07/2017 08:56   Dg Ribs Unilateral Right  Result Date: 06/07/2017 CLINICAL DATA:  Pain following fall EXAM: RIGHT RIBS - 2 VIEW COMPARISON:  Chest radiograph Jun 07, 2017 and chest CT April 22, 2017 FINDINGS: Oblique and cone-down rib images obtained. There are mildly displaced fractures of the lateral right fourth, fifth, and sixth ribs which appear recent/acute. No pneumothorax or pleural effusion evident on the right. No other rib lesions evident. Right-sided thyroid enlargement with displacement of the upper thoracic trachea toward the left again noted. IMPRESSION: Acute fractures with mild displacement of the lateral right fourth, fifth, and sixth ribs. No evident pneumothorax or pleural effusion on the right. Right lung clear. Enlarged thyroid on the right, documented previously. Electronically Signed   By: Lowella Grip III M.D.   On: 06/07/2017 10:48   Ct Head Wo Contrast  Result Date: 06/07/2017 CLINICAL DATA:  Intermittent vertigo. EXAM: CT HEAD WITHOUT CONTRAST TECHNIQUE: Contiguous axial images were obtained from the base of the skull through the vertex without intravenous contrast. COMPARISON:  None.  FINDINGS: Brain: No evidence of acute infarction, hemorrhage, hydrocephalus, extra-axial collection or mass lesion/mass effect. Mild age related cerebral atrophy. Scattered periventricular and subcortical white matter hypodensities are nonspecific, but favored to reflect chronic microvascular ischemic changes. Vascular: Atherosclerotic vascular calcification of the carotid siphons. No hyperdense vessel. Skull: Negative for fracture or focal lesion. Sinuses/Orbits: No acute finding. Other: None. IMPRESSION: 1.  No acute intracranial abnormality.  2. Mild age related cerebral atrophy and chronic microvascular ischemic changes. Electronically Signed   By: Titus Dubin M.D.   On: 06/07/2017 09:26   Ct Angio Chest Pe W And/or Wo Contrast  Result Date: 06/07/2017 CLINICAL DATA:  Shortness of breath.  Hypoxia and dyspnea EXAM: CT ANGIOGRAPHY CHEST WITH CONTRAST TECHNIQUE: Multidetector CT imaging of the chest was performed using the standard protocol during bolus administration of intravenous contrast. Multiplanar CT image reconstructions and MIPs were obtained to evaluate the vascular anatomy. CONTRAST:  100 cc Isovue 370 intravenous COMPARISON:  04/22/2017 and PET-CT 05/05/2017 FINDINGS: Cardiovascular:  Cardiomegaly without pericardial effusion. Mediastinum/Nodes: Multinodular goiter with tracheal deviation to the left, known. Negative for adenopathy Lungs/Pleura: Frothy appearing opacification of left lower lobe lobar and segmental airways. Similar appearance was seen on the most recent prior. Mild atelectasis at the bases. Calcified pulmonary nodule in the right lower lobe. 12 mm noncalcified pulmonary nodule in the left upper lobe, evaluated by PET CT last month. Left upper lobe ground-glass nodule is not discretely visualized today Upper Abdomen: Cholelithiasis.  Colonic diverticulosis. Musculoskeletal: Lateral right fourth, fifth, sixth, and seventh ribs fractures without visible callus. A posterior right  sixth rib is also fractured. Advanced bilateral glenohumeral osteoarthritis. Review of the MIP images confirms the above findings. IMPRESSION: 1. Acute lateral right fourth through seventh rib fractures. There is also a posterior right sixth rib fracture. 2. Left lower lobe airway debris with atelectasis. There is a background of generalized airway thickening. 3. Negative for pulmonary embolism. 4. Multinodular goiter. 5. 12 mm left upper lobe pulmonary nodule evaluated by PET CT last month. 6. Aortic Atherosclerosis (ICD10-I70.0). 7. Cholelithiasis. Electronically Signed   By: Monte Fantasia M.D.   On: 06/07/2017 14:56    Assessment and Plan  No problem-specific Assessment & Plan notes found for this encounter.   Labs/tests ordered:    Kristen Duos, MD

## 2017-07-07 ENCOUNTER — Other Ambulatory Visit: Payer: Self-pay | Admitting: *Deleted

## 2017-07-07 NOTE — Patient Outreach (Signed)
[facility]) Care Management  07/07/2017  Kristen Mason 05-11-1934 [id number]   Met with patient in her room at facility.  Patient lives with her daughter, [name].  Patient states her daughter works and she will be home alone during day.  She states she is improving with therapy at the facility.  She is nervous about going home. Patient has history of COPD, DM, and tremors.  RNCM had previously given [facility] packet, she states she had given to daughter.  RNCM reviewed [facility] program services and patient would like to have our services.  She states she may need a Data processing manager for transportation. Patient states she wants to use well care for home care services upon discharge. Verbal consent obtained No discharge is planned yet.  Plan to refer to [facility] LCSW to follow while at the facility and to refer out to [facility] for transition of care upon discharge.   Royetta Crochet. Laymond Purser, RN, BSN, [place] [phone]) Business Cell  [phone]) Toll Free Office

## 2017-07-08 ENCOUNTER — Other Ambulatory Visit: Payer: Self-pay | Admitting: Licensed Clinical Social Worker

## 2017-07-08 NOTE — Patient Outreach (Addendum)
[facility]) Care Management  07/08/2017  Kristen Mason 04-Jul-1934 [id number]  [facility] CSW received new referral on 07/07/17. [facility] CSW arrived at [facility] on 07/08/17 to complete [facility] Consult. Patient was in her room watching television when [facility] CSW arrived. HIPPA verifications provided. [facility] CSW introduced self, reason for visit and explained [place]. Patient reports remembering speaking with Burgess Amor yesterday about program as well. Patient lives with her daughter, Kristen Mason who is her main caregiver. Patient reports that she had two other daughters but they live out of state and are not as involved. Patient reports that she hadn't had a fall in 3 years until May of this year. Patient shares that she does NOT wish to be long term as daughter has informed her she would prefer to take care of patient in the home for as long as she can. Patient has mentioned to her daughter several times that she is willing to do LTC placement at anytime because she does not want to be a burden to her but daughter declined. Patient shares that she does not need Mobile Meals when she returns home as her daughter prefers all of her meals for her daily. Patient reports that she has a walker and wheelchair at home. Patient admits that her appetite has not been good lately because she does not like the facility food. Patient reports that she has stable transportation to medical appointments by grandson and daughter. Patient agreeable to be followed by [facility] CSW during her stay at [facility]. [facility] CSW will complete an additional PAC Consult within two weeks. [facility] CSW sent secure email to SNF social worker Marita [place] as she was not in her office when [facility] CSW stopped by after visiting with patient.   Eula Fried, BSW, MSW, [email or web address] Phone: [phone] Fax: [phone]

## 2017-07-12 ENCOUNTER — Other Ambulatory Visit: Payer: Self-pay | Admitting: Licensed Clinical Social Worker

## 2017-07-12 NOTE — Patient Outreach (Signed)
[name]  07/12/2017  Kristen Mason [birth year] [id number]  [facility] CSW received the following update from [facility] SNF social worker on 07/12/17 that states: Patient's last covered day is scheduled for 6/18 with discharge 6/19. Daughter did do an appeal this morning so we do not know the outcome of that as of yet. I spoke with daughter and she states if appeal is loss the plan is for her mother to return home and that makes her uncomfortable. Financially she cannot stay nor can she afford assisted living. Resident will be home alone from 7am to 6pm per daughter. She definitely will need to be followed by [facility]. Daughter voices concerns about blood pressure and dizziness. [facility] CSW completed call to [facility] and left a voice message encouraging a return call in order to place referral to program for patient.  [facility] CSW will complete SNF visit tomorrow and will continue to provide social work assistance and support.  Eula Fried, BSW, MSW, [email or web address] Phone: [phone] Fax: [phone]

## 2017-07-13 ENCOUNTER — Other Ambulatory Visit: Payer: Self-pay | Admitting: Licensed Clinical Social Worker

## 2017-07-13 ENCOUNTER — Non-Acute Institutional Stay (SKILLED_NURSING_FACILITY): Payer: Medicare Other | Admitting: Internal Medicine

## 2017-07-13 ENCOUNTER — Encounter: Payer: Self-pay | Admitting: Internal Medicine

## 2017-07-13 DIAGNOSIS — G63 Polyneuropathy in diseases classified elsewhere: Secondary | ICD-10-CM | POA: Diagnosis not present

## 2017-07-13 DIAGNOSIS — E1142 Type 2 diabetes mellitus with diabetic polyneuropathy: Secondary | ICD-10-CM | POA: Diagnosis not present

## 2017-07-13 DIAGNOSIS — F329 Major depressive disorder, single episode, unspecified: Secondary | ICD-10-CM

## 2017-07-13 DIAGNOSIS — J441 Chronic obstructive pulmonary disease with (acute) exacerbation: Secondary | ICD-10-CM

## 2017-07-13 DIAGNOSIS — G250 Essential tremor: Secondary | ICD-10-CM | POA: Diagnosis not present

## 2017-07-13 DIAGNOSIS — I10 Essential (primary) hypertension: Secondary | ICD-10-CM | POA: Diagnosis not present

## 2017-07-13 DIAGNOSIS — J9601 Acute respiratory failure with hypoxia: Secondary | ICD-10-CM

## 2017-07-13 DIAGNOSIS — G20 Parkinson's disease: Secondary | ICD-10-CM | POA: Diagnosis not present

## 2017-07-13 DIAGNOSIS — S2241XD Multiple fractures of ribs, right side, subsequent encounter for fracture with routine healing: Secondary | ICD-10-CM

## 2017-07-13 DIAGNOSIS — F32A Depression, unspecified: Secondary | ICD-10-CM

## 2017-07-13 DIAGNOSIS — F419 Anxiety disorder, unspecified: Secondary | ICD-10-CM

## 2017-07-13 DIAGNOSIS — F17200 Nicotine dependence, unspecified, uncomplicated: Secondary | ICD-10-CM

## 2017-07-13 DIAGNOSIS — W19XXXD Unspecified fall, subsequent encounter: Secondary | ICD-10-CM

## 2017-07-13 NOTE — Patient Outreach (Signed)
[facility]) Care Management  [facility] Social Work  07/13/2017  Kristen Mason 14-Oct-1934 [id number]  Encounter Medications:  Outpatient Encounter Medications as of 07/13/2017  Medication Sig  . albuterol (PROVENTIL) (2.5 MG/3ML) 0.083% nebulizer solution Take 3 mLs (2.5 mg total) by nebulization every 4 (four) hours as needed for shortness of breath.  Marland Kitchen aspirin 81 MG tablet Take 81 mg by mouth daily.  . carbidopa-levodopa (SINEMET IR) 25-100 MG tablet Take 1 tablet by mouth 3 (three) times daily.  . cetirizine (ZYRTEC) 10 MG tablet Take 10 mg by mouth daily.  . citalopram (CELEXA) 40 MG tablet Take 40 mg by mouth daily.  . clonazePAM (KLONOPIN) 1 MG tablet Take 1 tablet (1 mg total) by mouth at bedtime.  . dicyclomine (BENTYL) 10 MG capsule Take 10 mg by mouth 4 (four) times daily -  before meals and at bedtime.  . diphenhydrAMINE (SOMINEX) 25 MG tablet Take 25 mg by mouth at bedtime as needed for sleep.  . feeding supplement, GLUCERNA SHAKE, (GLUCERNA SHAKE) LIQD Take 237 mLs by mouth 2 (two) times daily between meals.  . gabapentin (NEURONTIN) 800 MG tablet Take 800 mg by mouth 3 (three) times daily.  Marland Kitchen guaiFENesin (MUCINEX) 600 MG 12 hr tablet Take 1 tablet (600 mg total) by mouth 2 (two) times daily.  . hydrOXYzine (ATARAX/VISTARIL) 25 MG tablet Take 25 mg by mouth at bedtime.  Marland Kitchen ipratropium-albuterol (DUONEB) 0.5-2.5 (3) MG/3ML SOLN Take 3 mLs by nebulization every 6 (six) hours.  . Lactobacillus (PROBIOTIC ACIDOPHILUS PO) Take 1 tablet by mouth daily.  Marland Kitchen lidocaine (LIDODERM) 5 % Place 1 patch onto the skin daily. Remove & Discard patch within 12 hours or as directed by MD  . lisinopril (PRINIVIL,ZESTRIL) 2.5 MG tablet Take 2.5 mg by mouth daily. take 1 tab po daily for htn  . nicotine (NICODERM CQ - DOSED IN MG/24 HOURS) 14 mg/24hr patch Place 1 patch (14 mg total) onto the skin daily.  . Omega-3 Fatty Acids (FISH OIL) 1000 MG CPDR Take 1,000 mg by mouth daily.  Marland Kitchen  oxyCODONE (ROXICODONE) 5 MG immediate release tablet Take 1 tablet (5 mg total) by mouth every 6 (six) hours as needed for severe pain (ONLY IF PAIN IS NOT CONTROLLED WITH TRAMADL).  . pantoprazole (PROTONIX) 40 MG tablet Take 40 mg by mouth daily.  . primidone (MYSOLINE) 250 MG tablet Take 250 mg by mouth 2 (two) times daily.  . traMADol (ULTRAM) 50 MG tablet Take 1 tablet (50 mg total) by mouth every 6 (six) hours as needed.   No facility-administered encounter medications on file as of 07/13/2017.     Functional Status:  In your present state of health, do you have any difficulty performing the following activities: 06/08/2017  Hearing? N  Vision? N  Difficulty concentrating or making decisions? N  Walking or climbing stairs? Y  Dressing or bathing? N  Doing errands, shopping? Y  Some recent data might be hidden    Fall/Depression Screening:  No flowsheet data found.  Assessment: [facility] CSW arrived at [facility] and successfully completed joint visit with patient and [facility] Pharmacist Kristen Mason. Patient reports that her daughter filed an appeal and that her case is currently being reviewed. Patient shares that she still feels that she is not ready to discharge back home yet but understands that she needs to create a safe discharge plan back home incase her appeal is not won. [facility] CSW completed call to patient's daughter Kristen Mason per  patient's request and discussed patient's case. Daughter admits that she is also very worried about patient returning back home as she will have to be alone from 7am-6:30 pm during the week while she is at work. Daughter educated on [facility] and both patient and daughter feel that this support will benefit patient since she has no real support at home while daughter is at work. [facility] CSW completed referral to [facility] successfully and faxed over patient's paperwork on 07/12/17. [facility] CSW completed financial assistance application with daughter and  approved patient for [place] has a contact with [facility] and are willing to pay for this service to assist patient but this aide service will only be a few hours a day a few days per week for 30 days. Daughter expressed understanding. Family feel that patient will not need an aide after 30 days. [facility] CSW spoke with [facility] with [facility] and discussed referral and patient's current disposition. Kristen Mason encouraged [facility] CSW to contact him as soon as we know patient is scheduled to be discharged. [facility] CSW went to SNF social worker's office and discussed case. SNF social worker reports not hearing back yet in regards to appeal and that it can take up to 48 hours to process. SNF social worker agreeable to keep [facility] CSW updated on discharge disposition.   Plan: [facility] CSW will await for SNF discharge.  Eula Fried, BSW, MSW, [email or web address] Phone: [phone] Fax: [phone]

## 2017-07-13 NOTE — Progress Notes (Signed)
Location:  [place] of Service:  SNF [id number]) Provider: Hennie Duos MD   PCP: Wenda Low, MD Patient Care Team: Wenda Low, MD as PCP - General (Internal Medicine) Luretha Rued, RN as [place] Management  Extended Emergency Contact Information Primary Emergency Contact: [place] [facility] [place] Phone: [phone] Relation: Daughter  Allergies  Allergen Reactions  . Statins Other (See Comments)    Chief Complaint  Patient presents with  . Discharge Note    HPI:  83 y.o. female with hypertension, diabetes mellitus, GERD, who presented to [facility] emergency department with a fall.  Patient says she got up out of bed, grabbed onto her walker and started walking then all of a sudden fell.  She had been dizzy, had fallen 3 times.  The dizzy episodes were described as everything was spinning.  She has had to have oxygen during prior hospitalizations but has not been known to have COPD or O2 dependence.  She is a smoker.  She denies that she was short of breath, however her daughter feels like she has had episodic heavy breathing at home over the last several weeks with a chronic cough productive of sputum.  Patient was admitted to [facility] from 5/13-17 where she was diagnosed and treated for acute hypoxic respiratory failure secondary to COPD exacerbation.  She also had a fall with right-sided rib fractures.  Patient was treated with O2, steroids, and bronchodilators with improvement.  Patient was admitted to skilled nursing facility for OT/PT.  It is time for patient to be discharged, she cannot be discharged at this time to home family is not prepared so she will be discharged to assisted living facility when one becomes available, hopefully in the next few days.    Past Medical History:  Diagnosis Date  . Diabetes ([place])   . Essential tremor   . Fall 06/07/2017   rib fractures  . GERD  (gastroesophageal reflux disease)   . Hypertension   . Peripheral neuropathy     Past Surgical History:  Procedure Laterality Date  . ABDOMINAL HYSTERECTOMY  1975  . BREAST LUMPECTOMY Right    non cancerous     reports that she has been smoking cigarettes.  She has a 62.00 pack-year smoking history. She uses smokeless tobacco. She reports that she does not drink alcohol or use drugs. Social History   Socioeconomic History  . Marital status: Unknown    Spouse name: Not on file  . Number of children: Not on file  . Years of education: Not on file  . Highest education level: Not on file  Occupational History  . Occupation: retired  Scientific laboratory technician  . Financial resource strain: Not on file  . Food insecurity:    Worry: Not on file    Inability: Not on file  . Transportation needs:    Medical: Not on file    Non-medical: Not on file  Tobacco Use  . Smoking status: Current Every Day Smoker    Packs/day: 1.00    Years: 62.00    Pack years: 62.00    Types: Cigarettes  . Smokeless tobacco: Current User  Substance and Sexual Activity  . Alcohol use: No    Alcohol/week: 0.0 oz  . Drug use: No  . Sexual activity: Not on file  Lifestyle  . Physical activity:    Days per week: Not on file    Minutes per session:  Not on file  . Stress: Not on file  Relationships  . Social connections:    Talks on phone: Not on file    Gets together: Not on file    Attends religious service: Not on file    Active member of club or organization: Not on file    Attends meetings of clubs or organizations: Not on file    Relationship status: Not on file  . Intimate partner violence:    Fear of current or ex partner: Not on file    Emotionally abused: Not on file    Physically abused: Not on file    Forced sexual activity: Not on file  Other Topics Concern  . Not on file  Social History Narrative  . Not on file    Pertinent  Health Maintenance Due  Topic Date Due  . FOOT EXAM  08/05/2017  (Originally 12/04/1944)  . OPHTHALMOLOGY EXAM  08/05/2017 (Originally 12/04/1944)  . DEXA SCAN  08/05/2017 (Originally 12/03/2001)  . INFLUENZA VACCINE  08/26/2017  . HEMOGLOBIN A1C  12/08/2017  . PNA vac Low Risk Adult (2 of 2 - PCV13) 03/08/2018    Medications: Allergies as of 07/13/2017      Reactions   Statins Other (See Comments)      Medication List        Accurate as of 07/13/17 11:59 PM. Always use your most recent med list.          albuterol (2.5 MG/3ML) 0.083% nebulizer solution Commonly known as:  PROVENTIL Take 3 mLs (2.5 mg total) by nebulization every 4 (four) hours as needed for shortness of breath.   aspirin 81 MG tablet Take 81 mg by mouth daily.   carbidopa-levodopa 25-100 MG tablet Commonly known as:  SINEMET IR Take 1 tablet by mouth 3 (three) times daily.   cetirizine 10 MG tablet Commonly known as:  ZYRTEC Take 10 mg by mouth daily.   citalopram 40 MG tablet Commonly known as:  CELEXA Take 40 mg by mouth daily.   clonazePAM 1 MG tablet Commonly known as:  KLONOPIN Take 1 tablet (1 mg total) by mouth at bedtime.   dicyclomine 10 MG capsule Commonly known as:  BENTYL Take 10 mg by mouth 4 (four) times daily -  before meals and at bedtime.   diphenhydrAMINE 25 MG tablet Commonly known as:  SOMINEX Take 25 mg by mouth at bedtime as needed for sleep.   feeding supplement (GLUCERNA SHAKE) Liqd Take 237 mLs by mouth 2 (two) times daily between meals.   Fish Oil 1000 MG Cpdr Take 1,000 mg by mouth daily.   gabapentin 800 MG tablet Commonly known as:  NEURONTIN Take 800 mg by mouth 3 (three) times daily.   guaiFENesin 600 MG 12 hr tablet Commonly known as:  MUCINEX Take 1 tablet (600 mg total) by mouth 2 (two) times daily.   hydrOXYzine 25 MG tablet Commonly known as:  ATARAX/VISTARIL Take 25 mg by mouth at bedtime.   ipratropium-albuterol 0.5-2.5 (3) MG/3ML Soln Commonly known as:  DUONEB Take 3 mLs by nebulization every 6 (six)  hours.   lidocaine 5 % Commonly known as:  LIDODERM Place 1 patch onto the skin daily. Remove & Discard patch within 12 hours or as directed by MD   lisinopril 2.5 MG tablet Commonly known as:  PRINIVIL,ZESTRIL Take 2.5 mg by mouth daily. take 1 tab po daily for htn   nicotine 14 mg/24hr patch Commonly known as:  NICODERM CQ - dosed  in mg/24 hours Place 1 patch (14 mg total) onto the skin daily.   oxyCODONE 5 MG immediate release tablet Commonly known as:  ROXICODONE Take 1 tablet (5 mg total) by mouth every 6 (six) hours as needed for severe pain (ONLY IF PAIN IS NOT CONTROLLED WITH TRAMADL).   pantoprazole 40 MG tablet Commonly known as:  PROTONIX Take 40 mg by mouth daily.   primidone 250 MG tablet Commonly known as:  MYSOLINE Take 250 mg by mouth 2 (two) times daily.   PROBIOTIC ACIDOPHILUS PO Take 1 tablet by mouth daily.   traMADol 50 MG tablet Commonly known as:  ULTRAM Take 1 tablet (50 mg total) by mouth every 6 (six) hours as needed.        Vitals:   07/24/17 1547  BP: 124/74  Pulse: 78  Resp: 18  Temp: 98.1 F (36.7 C)  SpO2: 97%   BMI 25.96 There is no height or weight on file to calculate BMI.  Physical Exam  GENERAL APPEARANCE: Alert, conversant. No acute distress.  HEENT: Unremarkable. RESPIRATORY: Breathing is even, unlabored. Lung sounds are clear   CARDIOVASCULAR: Heart RRR no murmurs, rubs or gallops. No peripheral edema.  GASTROINTESTINAL: Abdomen is soft, non-tender, not distended w/ normal bowel sounds.  NEUROLOGIC: Cranial nerves 2-12 grossly intact. Moves all extremities   Labs reviewed: Basic Metabolic Panel: Recent Labs    06/07/17 0826 06/08/17 0410  NA 139 136  K 4.5 4.3  CL 104 98*  CO2 27 28  GLUCOSE 165* 145*  BUN 14 19  CREATININE 1.00 0.96  CALCIUM 8.7* 8.4*   No results found for: [facility] Liver Function Tests: Recent Labs    06/07/17 0826  AST 17  ALT 6*  ALKPHOS 114  BILITOT 0.5  PROT 7.2    ALBUMIN 3.6   No results for input(s): LIPASE, AMYLASE in the last 8760 hours. No results for input(s): AMMONIA in the last 8760 hours. CBC: Recent Labs    06/07/17 0826 06/08/17 0410  WBC 4.7 6.5  NEUTROABS 3.3  --   HGB 12.4 11.9*  HCT 39.9 38.1  MCV 102.0* 101.6*  PLT 158 163   Lipid No results for input(s): CHOL, HDL, LDLCALC, TRIG in the last 8760 hours. Cardiac Enzymes: No results for input(s): CKTOTAL, CKMB, CKMBINDEX, TROPONINI in the last 8760 hours. BNP: Recent Labs    06/07/17 0834  BNP 113.6*   CBG: Recent Labs    06/11/17 0753 06/11/17 1153 06/11/17 1959  GLUCAP 177* 256* 106*    Procedures and Imaging Studies During Stay: No results found.  Assessment/Plan:   Acute respiratory failure with hypoxia (HCC)  COPD exacerbation (HCC)  Parkinson's disease ([place])  Fall, subsequent encounter  Closed fracture of multiple ribs of right side with routine healing, subsequent encounter  Tobacco dependence  Essential hypertension  Controlled type 2 diabetes mellitus with diabetic polyneuropathy, without long-term current use of insulin (HCC)  Essential tremor  Polyneuropathy associated with underlying disease (HCC)  Depression, unspecified depression type  Anxiety   Patient is being discharged with the following home health services: OT/PT/nursing/aid  Patient is being discharged with the following durable medical equipment: None  Patient has been advised to f/u with their PCP in 1-2 weeks to bring them up to date on their rehab stay.  Social services at facility was responsible for arranging this appointment.  Pt was provided with a 30 day supply of prescriptions for medications and refills must be obtained from their PCP.  For controlled substances, a more limited supply may be provided adequate until PCP appointment only.  Medications have been reconciled.  Time spent greater than 30 minutes;> 50% of time with patient was spent reviewing  records, labs, tests and studies, counseling and developing plan of care  Inocencio Homes, MD

## 2017-07-14 ENCOUNTER — Other Ambulatory Visit: Payer: Self-pay | Admitting: Licensed Clinical Social Worker

## 2017-07-14 NOTE — Patient Outreach (Signed)
[facility]) Care Management  07/14/2017  Kristen Mason Feb 12, 1934 [id number]  [facility] CSW completed call to [facility] and spoke with SNF social worker Myrtle. Marita [place] reports that patient will not be discharging back home today as patient's daughter filed a second level appeal and this can take another 2-3 days to process. [facility] CSW completed call to [facility] with [facility] but was unable to reach him. [facility] CSW left a voice message and provided update.  Eula Fried, BSW, MSW, [email or web address] Phone: [phone] Fax: [phone]

## 2017-07-14 NOTE — Progress Notes (Signed)
Opened in error

## 2017-07-20 ENCOUNTER — Telehealth: Payer: Self-pay | Admitting: *Deleted

## 2017-07-20 ENCOUNTER — Other Ambulatory Visit: Payer: Self-pay | Admitting: Licensed Clinical Social Worker

## 2017-07-20 NOTE — Telephone Encounter (Signed)
[facility] sent fax requesting substitute for atrovent 0.5-3(2.5)/68mL. Not covered with insurance and is ~$125. Send to [facility] in [place].

## 2017-07-20 NOTE — Patient Outreach (Addendum)
[facility]) Care Management  07/20/2017  Kristen Mason 13-Oct-1934 [id number]  [facility] CSW received update from [facility] social worker that patient successfully discharged to [facility] yesterday for a two week stay as family as NOT comfortable with her returning home yet. After two weeks, patient will discharge back home. [facility] CSW will sign off at this time. [facility] CSW will make referral for [facility] RNCM to follow patient closely during her transition back home from ALF.   Eula Fried, BSW, MSW, [email or web address] Phone: [phone] Fax: [phone]

## 2017-07-22 DIAGNOSIS — S2241XD Multiple fractures of ribs, right side, subsequent encounter for fracture with routine healing: Secondary | ICD-10-CM | POA: Diagnosis not present

## 2017-07-22 DIAGNOSIS — R32 Unspecified urinary incontinence: Secondary | ICD-10-CM | POA: Diagnosis not present

## 2017-07-22 DIAGNOSIS — E1142 Type 2 diabetes mellitus with diabetic polyneuropathy: Secondary | ICD-10-CM | POA: Diagnosis not present

## 2017-07-22 DIAGNOSIS — I10 Essential (primary) hypertension: Secondary | ICD-10-CM | POA: Diagnosis not present

## 2017-07-22 DIAGNOSIS — Z79891 Long term (current) use of opiate analgesic: Secondary | ICD-10-CM | POA: Diagnosis not present

## 2017-07-22 DIAGNOSIS — Z7982 Long term (current) use of aspirin: Secondary | ICD-10-CM | POA: Diagnosis not present

## 2017-07-22 DIAGNOSIS — F329 Major depressive disorder, single episode, unspecified: Secondary | ICD-10-CM | POA: Diagnosis not present

## 2017-07-22 DIAGNOSIS — Z9181 History of falling: Secondary | ICD-10-CM | POA: Diagnosis not present

## 2017-07-22 DIAGNOSIS — K219 Gastro-esophageal reflux disease without esophagitis: Secondary | ICD-10-CM | POA: Diagnosis not present

## 2017-07-22 DIAGNOSIS — G20 Parkinson's disease: Secondary | ICD-10-CM | POA: Diagnosis not present

## 2017-07-22 DIAGNOSIS — J441 Chronic obstructive pulmonary disease with (acute) exacerbation: Secondary | ICD-10-CM | POA: Diagnosis not present

## 2017-07-22 DIAGNOSIS — F17210 Nicotine dependence, cigarettes, uncomplicated: Secondary | ICD-10-CM | POA: Diagnosis not present

## 2017-07-22 DIAGNOSIS — F419 Anxiety disorder, unspecified: Secondary | ICD-10-CM | POA: Diagnosis not present

## 2017-07-24 ENCOUNTER — Encounter: Payer: Self-pay | Admitting: Internal Medicine

## 2017-07-24 DIAGNOSIS — W19XXXA Unspecified fall, initial encounter: Secondary | ICD-10-CM | POA: Insufficient documentation

## 2017-07-24 NOTE — Progress Notes (Signed)
This encounter was created in error - please disregard.

## 2017-07-26 ENCOUNTER — Other Ambulatory Visit: Payer: Self-pay

## 2017-07-26 NOTE — Patient Outreach (Signed)
[facility]) Care Management  07/26/2017  Latrena Benegas Dec 24, 1934 [id number]   Referral received 6/25 from Eula Fried, LCSW: client discharged from Skilled facility to [place] with plans to discharge to home in two weeks.  RNCM called client's daughter, [name]. Client answered the phone stating that daughter was not there. RNCM spoke with client and introduced self. Two patient identifiers confirmed. Client states, "I am doing real well" and plans to discharge to home on the 8th. Client reports that Ms. Duwayne Heck also feels better about client going home on the 8th. She adds, however that Daughter's ex-husband and her grandson's father past away last night.   Client states she does not have concerns about being by herself and plans to be home on the 8th.   RNCM left contact number on voice message and request call back from daughter if any questions.  Plan: RNCM will follow up next week to see if client has discharged to home.  Thea Silversmith, RN, MSN, [place] Coordinator Cell: [phone]

## 2017-08-03 ENCOUNTER — Other Ambulatory Visit: Payer: Self-pay

## 2017-08-03 DIAGNOSIS — K219 Gastro-esophageal reflux disease without esophagitis: Secondary | ICD-10-CM | POA: Diagnosis not present

## 2017-08-03 DIAGNOSIS — G20 Parkinson's disease: Secondary | ICD-10-CM | POA: Diagnosis not present

## 2017-08-03 DIAGNOSIS — S2241XD Multiple fractures of ribs, right side, subsequent encounter for fracture with routine healing: Secondary | ICD-10-CM | POA: Diagnosis not present

## 2017-08-03 DIAGNOSIS — J441 Chronic obstructive pulmonary disease with (acute) exacerbation: Secondary | ICD-10-CM | POA: Diagnosis not present

## 2017-08-03 DIAGNOSIS — I10 Essential (primary) hypertension: Secondary | ICD-10-CM | POA: Diagnosis not present

## 2017-08-03 DIAGNOSIS — E1142 Type 2 diabetes mellitus with diabetic polyneuropathy: Secondary | ICD-10-CM | POA: Diagnosis not present

## 2017-08-03 NOTE — Patient Outreach (Signed)
[place] [facility]) Care Management  08/03/2017  Kristen Mason 10/10/1934 [id number]   Subjective: "I got home yesterday" I have all my medicines.  Assessment: 83 year old with recent admission with acute respiratory failure with hypoxia post fall. Client was discharge from hospital to skilled facility to assisted living facility then to home on 08/02/17. History of Mutliple rib fracture, fall, HTN, DM, GERD, Parskinson's, smoker, but has not smoked since hospital admission.  RNCM called to follow up. Client reports she was discharged on yesterday. She reports home health therapist with [facility] has initiated care. She reports her follow up appointment scheduled for June 24th.   Medications reviewed. Client has all her medications. She states that she has nebulizer medication, but does not have a nebulizer machine. RNCM called primary care office and left message on Nicole's voicemessage to inform.  Plan: continue to follow for transition of care.  Thea Silversmith, RN, MSN, [place] Coordinator Cell: [phone]

## 2017-08-04 DIAGNOSIS — I10 Essential (primary) hypertension: Secondary | ICD-10-CM | POA: Diagnosis not present

## 2017-08-04 DIAGNOSIS — S2241XD Multiple fractures of ribs, right side, subsequent encounter for fracture with routine healing: Secondary | ICD-10-CM | POA: Diagnosis not present

## 2017-08-04 DIAGNOSIS — G20 Parkinson's disease: Secondary | ICD-10-CM | POA: Diagnosis not present

## 2017-08-04 DIAGNOSIS — J441 Chronic obstructive pulmonary disease with (acute) exacerbation: Secondary | ICD-10-CM | POA: Diagnosis not present

## 2017-08-04 DIAGNOSIS — K219 Gastro-esophageal reflux disease without esophagitis: Secondary | ICD-10-CM | POA: Diagnosis not present

## 2017-08-04 DIAGNOSIS — E1142 Type 2 diabetes mellitus with diabetic polyneuropathy: Secondary | ICD-10-CM | POA: Diagnosis not present

## 2017-08-11 DIAGNOSIS — I10 Essential (primary) hypertension: Secondary | ICD-10-CM | POA: Diagnosis not present

## 2017-08-11 DIAGNOSIS — J441 Chronic obstructive pulmonary disease with (acute) exacerbation: Secondary | ICD-10-CM | POA: Diagnosis not present

## 2017-08-11 DIAGNOSIS — G20 Parkinson's disease: Secondary | ICD-10-CM | POA: Diagnosis not present

## 2017-08-11 DIAGNOSIS — S2241XD Multiple fractures of ribs, right side, subsequent encounter for fracture with routine healing: Secondary | ICD-10-CM | POA: Diagnosis not present

## 2017-08-11 DIAGNOSIS — K219 Gastro-esophageal reflux disease without esophagitis: Secondary | ICD-10-CM | POA: Diagnosis not present

## 2017-08-11 DIAGNOSIS — E1142 Type 2 diabetes mellitus with diabetic polyneuropathy: Secondary | ICD-10-CM | POA: Diagnosis not present

## 2017-08-12 ENCOUNTER — Other Ambulatory Visit: Payer: Self-pay

## 2017-08-12 DIAGNOSIS — G20 Parkinson's disease: Secondary | ICD-10-CM | POA: Diagnosis not present

## 2017-08-12 DIAGNOSIS — J441 Chronic obstructive pulmonary disease with (acute) exacerbation: Secondary | ICD-10-CM | POA: Diagnosis not present

## 2017-08-12 DIAGNOSIS — K219 Gastro-esophageal reflux disease without esophagitis: Secondary | ICD-10-CM | POA: Diagnosis not present

## 2017-08-12 DIAGNOSIS — S2241XD Multiple fractures of ribs, right side, subsequent encounter for fracture with routine healing: Secondary | ICD-10-CM | POA: Diagnosis not present

## 2017-08-12 DIAGNOSIS — E1142 Type 2 diabetes mellitus with diabetic polyneuropathy: Secondary | ICD-10-CM | POA: Diagnosis not present

## 2017-08-12 DIAGNOSIS — I10 Essential (primary) hypertension: Secondary | ICD-10-CM | POA: Diagnosis not present

## 2017-08-12 NOTE — Patient Outreach (Signed)
[place] [facility]) Care Management  08/12/2017  Lorene Klimas 1934-07-21 [id number]   Referral received 6/25 from Eula Fried, LCSW: client discharged from Skilled facility to Assisted Living to home.    Subjective: "My breathing is good" and referring to the nebulizer machine, client states, "I did need it at first when I broke my rib, but I really dont think I need it now".   Assessment: 83 year old with recent admission with acute respiratory failure with hypoxia post fall. Client was discharge from hospital to skilled facility to assisted living facility then to home on 08/02/17. History of Mutliple rib fracture, fall, HTN, DM, GERD, Parskinson's, smoker, but has not smoked since hospital admission.  RNCM called to follow up. Client states she is doing well. She states she has an upcoming appointment with primary care on July with her primary care. She states her daughter is taking her to her appointments and also helps her with managing her care. Ms. Thelander expresses how she appreciates her daughter and that she in taking care of her. "She fixes my medicines and fixes my coffee because I have central tremors".   She reports [facility] is starting next Monday and is sending personal care service twice a week. As well as physical therapy and occupational therapy. She reports she wants to get where she is not afraid to fall.   She denies any other questions or concerns.   Plan: home visit scheduled. [facility] CM Care Plan Problem One     Most Recent Value  Care Plan Problem One  potential for care management resources/needs in transti  Role Documenting the Problem One  Care Management [place] for Problem One  Active  [facility] CM Short Term Goal #1   RNCM will call RNCM with questions/concerns as needed within the next 1-2 weeks.   [facility] CM Short Term Goal #1 Start Date  07/26/17  Interventions for Short Term Goal #1  encouarged client to call as needed.  [facility] CM  Short Term Goal #2   at risk for readmission as evidence by recent admission and stay at rehabiliation facility.  [facility] CM Short Term Goal #2 Start Date  08/03/17  Interventions for Short Term Goal #2  discussed client's overall sense of well being, discussed follow up appointments, confirmed client is active with home health   [facility] CM Short Term Goal #3  client will follow up with provider visits within the next 30 days as scheduled.  [facility] CM Short Term Goal #3 Start Date  08/03/17  Interventions for Short Tern Goal #3  RNCM discussed upcoming appointments    [facility] CM Care Plan Problem Two     Most Recent Value  Care Plan Problem Two  at risk for fall-recent fall  Role Documenting the Problem Two  Care Management [place] for Problem Two  Active  Interventions for Problem Two Long Term Goal   confirmed active with home health , discussed with client the importance of participating with therapist.  [facility] Long Term Goal  client will verbalize at least three fall prevention strategies within then next 31 days.  [facility] Long Term Goal Start Date  08/12/17  [facility] CM Short Term Goal #1   client will participate with occupational therapy and physical therapy services as schedule within the next 30 days.  [facility] CM Short Term Goal #1 Start Date  08/12/17  Interventions for Short Term Goal #2   provided positive feedback with  client's self care management and attitude toward getting better.     Thea Silversmith, RN, MSN, [place] Coordinator Cell: [phone]

## 2017-08-16 DIAGNOSIS — K219 Gastro-esophageal reflux disease without esophagitis: Secondary | ICD-10-CM | POA: Diagnosis not present

## 2017-08-16 DIAGNOSIS — S2241XD Multiple fractures of ribs, right side, subsequent encounter for fracture with routine healing: Secondary | ICD-10-CM | POA: Diagnosis not present

## 2017-08-16 DIAGNOSIS — E1142 Type 2 diabetes mellitus with diabetic polyneuropathy: Secondary | ICD-10-CM | POA: Diagnosis not present

## 2017-08-16 DIAGNOSIS — I10 Essential (primary) hypertension: Secondary | ICD-10-CM | POA: Diagnosis not present

## 2017-08-16 DIAGNOSIS — J441 Chronic obstructive pulmonary disease with (acute) exacerbation: Secondary | ICD-10-CM | POA: Diagnosis not present

## 2017-08-16 DIAGNOSIS — G20 Parkinson's disease: Secondary | ICD-10-CM | POA: Diagnosis not present

## 2017-08-17 DIAGNOSIS — S2241XD Multiple fractures of ribs, right side, subsequent encounter for fracture with routine healing: Secondary | ICD-10-CM | POA: Diagnosis not present

## 2017-08-17 DIAGNOSIS — K219 Gastro-esophageal reflux disease without esophagitis: Secondary | ICD-10-CM | POA: Diagnosis not present

## 2017-08-17 DIAGNOSIS — E1142 Type 2 diabetes mellitus with diabetic polyneuropathy: Secondary | ICD-10-CM | POA: Diagnosis not present

## 2017-08-17 DIAGNOSIS — G20 Parkinson's disease: Secondary | ICD-10-CM | POA: Diagnosis not present

## 2017-08-17 DIAGNOSIS — I10 Essential (primary) hypertension: Secondary | ICD-10-CM | POA: Diagnosis not present

## 2017-08-17 DIAGNOSIS — J441 Chronic obstructive pulmonary disease with (acute) exacerbation: Secondary | ICD-10-CM | POA: Diagnosis not present

## 2017-08-18 DIAGNOSIS — G20 Parkinson's disease: Secondary | ICD-10-CM | POA: Diagnosis not present

## 2017-08-18 DIAGNOSIS — N183 Chronic kidney disease, stage 3 (moderate): Secondary | ICD-10-CM | POA: Diagnosis not present

## 2017-08-18 DIAGNOSIS — F325 Major depressive disorder, single episode, in full remission: Secondary | ICD-10-CM | POA: Diagnosis not present

## 2017-08-18 DIAGNOSIS — I10 Essential (primary) hypertension: Secondary | ICD-10-CM | POA: Diagnosis not present

## 2017-08-18 DIAGNOSIS — E782 Mixed hyperlipidemia: Secondary | ICD-10-CM | POA: Diagnosis not present

## 2017-08-18 DIAGNOSIS — E1142 Type 2 diabetes mellitus with diabetic polyneuropathy: Secondary | ICD-10-CM | POA: Diagnosis not present

## 2017-08-18 DIAGNOSIS — Z7984 Long term (current) use of oral hypoglycemic drugs: Secondary | ICD-10-CM | POA: Diagnosis not present

## 2017-08-18 DIAGNOSIS — G629 Polyneuropathy, unspecified: Secondary | ICD-10-CM | POA: Diagnosis not present

## 2017-08-18 DIAGNOSIS — R251 Tremor, unspecified: Secondary | ICD-10-CM | POA: Diagnosis not present

## 2017-08-19 ENCOUNTER — Other Ambulatory Visit: Payer: Self-pay

## 2017-08-19 DIAGNOSIS — G20 Parkinson's disease: Secondary | ICD-10-CM | POA: Diagnosis not present

## 2017-08-19 DIAGNOSIS — I10 Essential (primary) hypertension: Secondary | ICD-10-CM | POA: Diagnosis not present

## 2017-08-19 DIAGNOSIS — J441 Chronic obstructive pulmonary disease with (acute) exacerbation: Secondary | ICD-10-CM | POA: Diagnosis not present

## 2017-08-19 DIAGNOSIS — K219 Gastro-esophageal reflux disease without esophagitis: Secondary | ICD-10-CM | POA: Diagnosis not present

## 2017-08-19 DIAGNOSIS — S2241XD Multiple fractures of ribs, right side, subsequent encounter for fracture with routine healing: Secondary | ICD-10-CM | POA: Diagnosis not present

## 2017-08-19 DIAGNOSIS — E1142 Type 2 diabetes mellitus with diabetic polyneuropathy: Secondary | ICD-10-CM | POA: Diagnosis not present

## 2017-08-19 NOTE — Patient Outreach (Signed)
[name]) Care Management   08/19/2017  Kristen Mason [birth year] [id number]  Kristen Mason is an 83 y.o. female  Subjective: Client reports she is doing well. She expressed being proud of herself as she has not had a cigarette since her hospitalization.  Objective:  BP (!) 160/70   Pulse 94   Ht 1.676 m (5' 6" ) Comment: patient reported.  Wt 159 lb (72.1 kg) Comment: patient reported.  SpO2 95%   BMI 25.66 kg/m   Review of Systems  Respiratory:       Lungs clear, decreased. Respirations even/unlabored.  Cardiovascular:       S1S2 noted. regular    Physical Exam skin warm dry, color within normal limits, ambulates with walker, gait steady.  Encounter Medications:   Outpatient Encounter Medications as of 08/19/2017  Medication Sig  . albuterol (PROVENTIL) (2.5 MG/3ML) 0.083% nebulizer solution Take 3 mLs (2.5 mg total) by nebulization every 4 (four) hours as needed for shortness of breath. (Patient not taking: Reported on 08/03/2017)  . aspirin 81 MG tablet Take 81 mg by mouth daily.  . carbidopa-levodopa (SINEMET IR) 25-100 MG tablet Take 1 tablet by mouth 3 (three) times daily.  . cetirizine (ZYRTEC) 10 MG tablet Take 10 mg by mouth daily.  . citalopram (CELEXA) 40 MG tablet Take 40 mg by mouth daily.  . clonazePAM (KLONOPIN) 1 MG tablet Take 1 tablet (1 mg total) by mouth at bedtime.  . dicyclomine (BENTYL) 10 MG capsule Take 10 mg by mouth 4 (four) times daily -  before meals and at bedtime.  . diphenhydrAMINE (SOMINEX) 25 MG tablet Take 25 mg by mouth at bedtime as needed for sleep.  . feeding supplement, GLUCERNA SHAKE, (GLUCERNA SHAKE) LIQD Take 237 mLs by mouth 2 (two) times daily between meals.  . gabapentin (NEURONTIN) 800 MG tablet Take 800 mg by mouth 3 (three) times daily.  Marland Kitchen guaiFENesin (MUCINEX) 600 MG 12 hr tablet Take 1 tablet (600 mg total) by mouth 2 (two) times daily.  . hydrOXYzine (ATARAX/VISTARIL) 25 MG tablet Take 25 mg by mouth at  bedtime.  Marland Kitchen ipratropium-albuterol (DUONEB) 0.5-2.5 (3) MG/3ML SOLN Take 3 mLs by nebulization every 6 (six) hours. (Patient not taking: Reported on 08/03/2017)  . Lactobacillus (PROBIOTIC ACIDOPHILUS PO) Take 1 tablet by mouth daily.  Marland Kitchen lidocaine (LIDODERM) 5 % Place 1 patch onto the skin daily. Remove & Discard patch within 12 hours or as directed by MD  . lisinopril (PRINIVIL,ZESTRIL) 2.5 MG tablet Take 2.5 mg by mouth daily. take 1 tab po daily for htn  . nicotine (NICODERM CQ - DOSED IN MG/24 HOURS) 14 mg/24hr patch Place 1 patch (14 mg total) onto the skin daily.  . Omega-3 Fatty Acids (FISH OIL) 1000 MG CPDR Take 1,000 mg by mouth daily.  Marland Kitchen oxyCODONE (ROXICODONE) 5 MG immediate release tablet Take 1 tablet (5 mg total) by mouth every 6 (six) hours as needed for severe pain (ONLY IF PAIN IS NOT CONTROLLED WITH TRAMADL).  . pantoprazole (PROTONIX) 40 MG tablet Take 40 mg by mouth daily.  . primidone (MYSOLINE) 250 MG tablet Take 250 mg by mouth 2 (two) times daily.  . traMADol (ULTRAM) 50 MG tablet Take 1 tablet (50 mg total) by mouth every 6 (six) hours as needed.   No facility-administered encounter medications on file as of 08/19/2017.     Functional Status:   In your present state of health, do you have any difficulty performing the following activities: 08/12/2017 06/08/2017  Hearing? N N  Vision? N N  Difficulty concentrating or making decisions? N N  Walking or climbing stairs? Y Y  Dressing or bathing? Y N  Doing errands, shopping? [name]  Preparing Food and eating ? Y -  Using the Toilet? N -  In the past six months, have you accidently leaked urine? Y -  Comment "When I cough, sometimes it leaks" -  Do you have problems with loss of bowel control? N -  Managing your Medications? Y -  Managing your Finances? Y -  Housekeeping or managing your Housekeeping? Y -  Some recent data might be hidden    Fall/Depression Screening:    Fall Risk  08/03/2017  Falls in the past year? Yes   Number falls in past yr: 2 or more  Injury with Fall? Yes  Risk Factor Category  High Fall Risk  Risk for fall due to : History of fall(s);Impaired balance/gait  Follow up Falls prevention discussed   PHQ 2/9 Scores 08/12/2017  PHQ - 2 Score 0    Assessment:  83 year old with recent admission with acute respiratory failure with hypoxia post fall. Client was discharge from hospital to skilled facility to assisted living facility then to home on 08/02/17. History of Mutliple rib fracture, fall, HTN, DM, GERD, Parkinson's, smoker, but has not smoked since hospital admission.  RNCM completed home visit. Client lives with daughter and states her daughter, Kristen Mason, takes good care of her.  Client reports last visit with primary care was yesterday. She states her blood pressure was elevated and her blood pressure medication was increased. Today her blood pressure was 160/70 and client reports she had just taken her medications about an hour before.   Client continues to participate with home health therapy and denies any concerns at this time.h  Plan: telephonic call next week. [facility]N CM Care Plan Problem One     Most Recent Value  Care Plan Problem One  at risk for readmission as evidence by recent discharge from rehabilitation facility.  Role Documenting the Problem One  Care Management Coordinator  Care Plan for Problem One  Active  THN CM Short Term Goal #1   RNCM will call RNCM with questions/concerns as needed within the next 1-2 weeks.   [facility]N CM Short Term Goal #1 Start Date  07/26/17  Interventions for Short Term Goal #1  RNCM reinforced and provided RNCM's contact number again, 24 hour nurse advice line number provided again and encouraged to call as needed.  [facility]N CM Short Term Goal #2   at risk for readmission as evidence by recent admission and stay at rehabiliation facility.  [facility]N CM Short Term Goal #2 Start Date  08/03/17  [facility] CM Short Term Goal #2 Met Date  -- [problem statement.]   THN CM Short Term Goal #3  client will follow up with provider visits within the next 30 days as scheduled.  [facility]N CM Short Term Goal #3 Start Date  08/03/17  [facility] CM Short Term Goal #3 Met Date  08/19/17    [facility] CM Care Plan Problem Two     Most Recent Value  Care Plan Problem Two  at risk for fall-recent fall  Role Documenting the Problem Two  Care Management Coordinator  Care Plan for Problem Two  Active  Interventions for Problem Two Long Term Goal   RNCM reviewed fall prevention strategies.  [place] Term Goal  client will verbalize at least three fall prevention strategies  within then next 31 days.  THN Long Term Goal Start Date  08/12/17  [facility] CM Short Term Goal #1   client will participate with occupational therapy and physical therapy services as schedule within the next 30 days.  [facility]N CM Short Term Goal #1 Start Date  08/12/17  Interventions for Short Term Goal #2   encouraged continued participation in home health therapy.    [facility] CM Care Plan Problem Three     Most Recent Value  Care Plan Problem Three  increase in blood pressure per client's verbalization.  Role Documenting the Problem Three  Care Management Coordinator  Care Plan for Problem Three  Active  THN CM Short Term Goal #1   client/daughter will obtain a blood pressure cuff for home BP monitoring within the net 30 days.  [facility]N CM Short Term Goal #1 Start Date  08/19/17  Interventions for Short Term Goal #1  RNCM discussed importance of being able to follow blood pressure readings during medication changes, also provided [facility] calender organizer and explained how to use and where to record readings, RNCM reinforced that home health therapist will be taking blood pressure during their visit and she can record their readings.     Thea Silversmith, RN, MSN, [place] Coordinator Cell: [phone]

## 2017-08-20 DIAGNOSIS — S2241XD Multiple fractures of ribs, right side, subsequent encounter for fracture with routine healing: Secondary | ICD-10-CM | POA: Diagnosis not present

## 2017-08-20 DIAGNOSIS — J441 Chronic obstructive pulmonary disease with (acute) exacerbation: Secondary | ICD-10-CM | POA: Diagnosis not present

## 2017-08-20 DIAGNOSIS — K219 Gastro-esophageal reflux disease without esophagitis: Secondary | ICD-10-CM | POA: Diagnosis not present

## 2017-08-20 DIAGNOSIS — E1142 Type 2 diabetes mellitus with diabetic polyneuropathy: Secondary | ICD-10-CM | POA: Diagnosis not present

## 2017-08-20 DIAGNOSIS — I10 Essential (primary) hypertension: Secondary | ICD-10-CM | POA: Diagnosis not present

## 2017-08-20 DIAGNOSIS — G20 Parkinson's disease: Secondary | ICD-10-CM | POA: Diagnosis not present

## 2017-08-21 DIAGNOSIS — E1142 Type 2 diabetes mellitus with diabetic polyneuropathy: Secondary | ICD-10-CM | POA: Diagnosis not present

## 2017-08-21 DIAGNOSIS — J441 Chronic obstructive pulmonary disease with (acute) exacerbation: Secondary | ICD-10-CM | POA: Diagnosis not present

## 2017-08-21 DIAGNOSIS — S2241XD Multiple fractures of ribs, right side, subsequent encounter for fracture with routine healing: Secondary | ICD-10-CM | POA: Diagnosis not present

## 2017-08-21 DIAGNOSIS — K219 Gastro-esophageal reflux disease without esophagitis: Secondary | ICD-10-CM | POA: Diagnosis not present

## 2017-08-21 DIAGNOSIS — G20 Parkinson's disease: Secondary | ICD-10-CM | POA: Diagnosis not present

## 2017-08-21 DIAGNOSIS — I10 Essential (primary) hypertension: Secondary | ICD-10-CM | POA: Diagnosis not present

## 2017-08-23 ENCOUNTER — Other Ambulatory Visit: Payer: Self-pay

## 2017-08-23 NOTE — Patient Outreach (Signed)
[place] [facility]) Care Management  08/23/2017  Kristen Mason Jan 19, 1935 [id number]  Subjective: "I'm doing real good".  Objective: none  Assessment: 83 year old with recent admission with acute respiratory failure with hypoxia post fall. Client was discharge from hospital to skilled facility to assisted living facility then to home on 08/02/17. History of Mutliple rib fracture, fall, HTN, DM, GERD, Parkinson's, smoker, but has not smoked since hospital admission.  Client is in the transition of care program. RNCM called to follow up. She reports Saturday physical therapist took her blood pressure and she states, "It is doing better". She does not remember what it was but states she was excited to hear that her blood pressure was good.  Client denies any questions or concerns at this time.  Plan: telephonic follow up next week. [facility]N CM Care Plan Problem One     Most Recent Value  Care Plan Problem One  at risk for readmission as evidence by recent discharge from rehabilitation facility.  Role Documenting the Problem One  Care Management [place] for Problem One  Active  THN Long Term Goal   client will not be readmitted within 31 days of discharge to home  [place] Start Date  07/26/17  Interventions for Problem One Long Term Goal  discussed with client her overall sense of well being, encouraged client to continue to work with home health  and record blood pressures in her calender/organizer.  [facility]N CM Short Term Goal #1   RNCM will call RNCM with questions/concerns as needed within the next 1-2 weeks.   [facility]N CM Short Term Goal #1 Start Date  07/26/17  Interventions for Short Term Goal #1  encouraged to call as needed.  [facility]N CM Short Term Goal #2   at risk for readmission as evidence by recent admission and stay at rehabiliation facility.  [facility]N CM Short Term Goal #2 Start Date  08/03/17  [facility] CM Short Term Goal #2 Met Date  -- [problem statement.]  THN CM  Short Term Goal #3  client will follow up with provider visits within the next 30 days as scheduled.  [facility]N CM Short Term Goal #3 Start Date  08/03/17  [facility] CM Short Term Goal #3 Met Date  08/19/17    [facility] CM Care Plan Problem Two     Most Recent Value  Care Plan Problem Two  at risk for fall-recent fall  Role Documenting the Problem Two  Care Management [place] for Problem Two  Active  Interventions for Problem Two Long Term Goal   encouraged to continue to work with therapist as scheduled.  [place] Term Goal  client will verbalize at least three fall prevention strategies within then next 31 days.  THN Long Term Goal Start Date  08/12/17  [facility] CM Short Term Goal #1   client will participate with occupational therapy and physical therapy services as schedule within the next 30 days.  THN CM Short Term Goal #1 Start Date  08/12/17  Interventions for Short Term Goal #2   confirmd client was still involved with home health therapist.    [facility] CM Care Plan Problem Three     Most Recent Value  Care Plan Problem Three  increase in blood pressure per client's verbalization.  Role Documenting the Problem Three  Care Management Coordinator  Care Plan for Problem Three  Active  THN CM Short Term Goal #1   client/daughter will obtain a blood  pressure cuff for home BP monitoring within the net 30 days.  THN CM Short Term Goal #1 Start Date  08/19/17  Interventions for Short Term Goal #1  confirmed client has been getting blood pressure reading check by therapist     Thea Silversmith, RN, MSN, [place] Coordinator Cell: [phone]

## 2017-08-24 DIAGNOSIS — I10 Essential (primary) hypertension: Secondary | ICD-10-CM | POA: Diagnosis not present

## 2017-08-24 DIAGNOSIS — J441 Chronic obstructive pulmonary disease with (acute) exacerbation: Secondary | ICD-10-CM | POA: Diagnosis not present

## 2017-08-24 DIAGNOSIS — K219 Gastro-esophageal reflux disease without esophagitis: Secondary | ICD-10-CM | POA: Diagnosis not present

## 2017-08-24 DIAGNOSIS — G20 Parkinson's disease: Secondary | ICD-10-CM | POA: Diagnosis not present

## 2017-08-24 DIAGNOSIS — S2241XD Multiple fractures of ribs, right side, subsequent encounter for fracture with routine healing: Secondary | ICD-10-CM | POA: Diagnosis not present

## 2017-08-24 DIAGNOSIS — E1142 Type 2 diabetes mellitus with diabetic polyneuropathy: Secondary | ICD-10-CM | POA: Diagnosis not present

## 2017-08-25 DIAGNOSIS — E1142 Type 2 diabetes mellitus with diabetic polyneuropathy: Secondary | ICD-10-CM | POA: Diagnosis not present

## 2017-08-25 DIAGNOSIS — S2241XD Multiple fractures of ribs, right side, subsequent encounter for fracture with routine healing: Secondary | ICD-10-CM | POA: Diagnosis not present

## 2017-08-25 DIAGNOSIS — G20 Parkinson's disease: Secondary | ICD-10-CM | POA: Diagnosis not present

## 2017-08-25 DIAGNOSIS — J441 Chronic obstructive pulmonary disease with (acute) exacerbation: Secondary | ICD-10-CM | POA: Diagnosis not present

## 2017-08-25 DIAGNOSIS — K219 Gastro-esophageal reflux disease without esophagitis: Secondary | ICD-10-CM | POA: Diagnosis not present

## 2017-08-25 DIAGNOSIS — I10 Essential (primary) hypertension: Secondary | ICD-10-CM | POA: Diagnosis not present

## 2017-08-26 DIAGNOSIS — G20 Parkinson's disease: Secondary | ICD-10-CM | POA: Diagnosis not present

## 2017-08-26 DIAGNOSIS — S2241XD Multiple fractures of ribs, right side, subsequent encounter for fracture with routine healing: Secondary | ICD-10-CM | POA: Diagnosis not present

## 2017-08-26 DIAGNOSIS — I10 Essential (primary) hypertension: Secondary | ICD-10-CM | POA: Diagnosis not present

## 2017-08-26 DIAGNOSIS — E1142 Type 2 diabetes mellitus with diabetic polyneuropathy: Secondary | ICD-10-CM | POA: Diagnosis not present

## 2017-08-26 DIAGNOSIS — J441 Chronic obstructive pulmonary disease with (acute) exacerbation: Secondary | ICD-10-CM | POA: Diagnosis not present

## 2017-08-26 DIAGNOSIS — K219 Gastro-esophageal reflux disease without esophagitis: Secondary | ICD-10-CM | POA: Diagnosis not present

## 2017-08-30 DIAGNOSIS — G20 Parkinson's disease: Secondary | ICD-10-CM | POA: Diagnosis not present

## 2017-08-30 DIAGNOSIS — K219 Gastro-esophageal reflux disease without esophagitis: Secondary | ICD-10-CM | POA: Diagnosis not present

## 2017-08-30 DIAGNOSIS — E1142 Type 2 diabetes mellitus with diabetic polyneuropathy: Secondary | ICD-10-CM | POA: Diagnosis not present

## 2017-08-30 DIAGNOSIS — S2241XD Multiple fractures of ribs, right side, subsequent encounter for fracture with routine healing: Secondary | ICD-10-CM | POA: Diagnosis not present

## 2017-08-30 DIAGNOSIS — I10 Essential (primary) hypertension: Secondary | ICD-10-CM | POA: Diagnosis not present

## 2017-08-30 DIAGNOSIS — J441 Chronic obstructive pulmonary disease with (acute) exacerbation: Secondary | ICD-10-CM | POA: Diagnosis not present

## 2017-09-01 DIAGNOSIS — S2241XD Multiple fractures of ribs, right side, subsequent encounter for fracture with routine healing: Secondary | ICD-10-CM | POA: Diagnosis not present

## 2017-09-01 DIAGNOSIS — E1142 Type 2 diabetes mellitus with diabetic polyneuropathy: Secondary | ICD-10-CM | POA: Diagnosis not present

## 2017-09-01 DIAGNOSIS — K219 Gastro-esophageal reflux disease without esophagitis: Secondary | ICD-10-CM | POA: Diagnosis not present

## 2017-09-01 DIAGNOSIS — J441 Chronic obstructive pulmonary disease with (acute) exacerbation: Secondary | ICD-10-CM | POA: Diagnosis not present

## 2017-09-01 DIAGNOSIS — G20 Parkinson's disease: Secondary | ICD-10-CM | POA: Diagnosis not present

## 2017-09-01 DIAGNOSIS — I10 Essential (primary) hypertension: Secondary | ICD-10-CM | POA: Diagnosis not present

## 2017-09-02 DIAGNOSIS — G20 Parkinson's disease: Secondary | ICD-10-CM | POA: Diagnosis not present

## 2017-09-02 DIAGNOSIS — E1142 Type 2 diabetes mellitus with diabetic polyneuropathy: Secondary | ICD-10-CM | POA: Diagnosis not present

## 2017-09-02 DIAGNOSIS — S2241XD Multiple fractures of ribs, right side, subsequent encounter for fracture with routine healing: Secondary | ICD-10-CM | POA: Diagnosis not present

## 2017-09-02 DIAGNOSIS — I10 Essential (primary) hypertension: Secondary | ICD-10-CM | POA: Diagnosis not present

## 2017-09-02 DIAGNOSIS — J441 Chronic obstructive pulmonary disease with (acute) exacerbation: Secondary | ICD-10-CM | POA: Diagnosis not present

## 2017-09-02 DIAGNOSIS — K219 Gastro-esophageal reflux disease without esophagitis: Secondary | ICD-10-CM | POA: Diagnosis not present

## 2017-09-03 ENCOUNTER — Other Ambulatory Visit: Payer: Self-pay

## 2017-09-03 DIAGNOSIS — S2241XD Multiple fractures of ribs, right side, subsequent encounter for fracture with routine healing: Secondary | ICD-10-CM | POA: Diagnosis not present

## 2017-09-03 DIAGNOSIS — K219 Gastro-esophageal reflux disease without esophagitis: Secondary | ICD-10-CM | POA: Diagnosis not present

## 2017-09-03 DIAGNOSIS — J441 Chronic obstructive pulmonary disease with (acute) exacerbation: Secondary | ICD-10-CM | POA: Diagnosis not present

## 2017-09-03 DIAGNOSIS — E1142 Type 2 diabetes mellitus with diabetic polyneuropathy: Secondary | ICD-10-CM | POA: Diagnosis not present

## 2017-09-03 DIAGNOSIS — I10 Essential (primary) hypertension: Secondary | ICD-10-CM | POA: Diagnosis not present

## 2017-09-03 DIAGNOSIS — G20 Parkinson's disease: Secondary | ICD-10-CM | POA: Diagnosis not present

## 2017-09-03 NOTE — Patient Outreach (Signed)
[name]) Care Management  09/03/2017  Kristen Mason [birth year] [id number]   RNCM called to follow up. Client reports that her blood pressure has been elevated per home health agency, who has called and reported to primary care. She reports blood pressure medication increased and she will follow up with primary care the week of the 19th. She states she continues to be active with home health and they are monitoring her blood pressure. She also reports that her daughter has mail ordered her blood pressure cuff.  Plan: home visit in the next 2-3 weeks.  Thea Silversmith, RN, MSN, [place] Coordinator Cell: [phone]

## 2017-09-07 DIAGNOSIS — G20 Parkinson's disease: Secondary | ICD-10-CM | POA: Diagnosis not present

## 2017-09-07 DIAGNOSIS — E1142 Type 2 diabetes mellitus with diabetic polyneuropathy: Secondary | ICD-10-CM | POA: Diagnosis not present

## 2017-09-07 DIAGNOSIS — J441 Chronic obstructive pulmonary disease with (acute) exacerbation: Secondary | ICD-10-CM | POA: Diagnosis not present

## 2017-09-07 DIAGNOSIS — S2241XD Multiple fractures of ribs, right side, subsequent encounter for fracture with routine healing: Secondary | ICD-10-CM | POA: Diagnosis not present

## 2017-09-07 DIAGNOSIS — I10 Essential (primary) hypertension: Secondary | ICD-10-CM | POA: Diagnosis not present

## 2017-09-07 DIAGNOSIS — K219 Gastro-esophageal reflux disease without esophagitis: Secondary | ICD-10-CM | POA: Diagnosis not present

## 2017-09-09 DIAGNOSIS — E1142 Type 2 diabetes mellitus with diabetic polyneuropathy: Secondary | ICD-10-CM | POA: Diagnosis not present

## 2017-09-09 DIAGNOSIS — S2241XD Multiple fractures of ribs, right side, subsequent encounter for fracture with routine healing: Secondary | ICD-10-CM | POA: Diagnosis not present

## 2017-09-09 DIAGNOSIS — K219 Gastro-esophageal reflux disease without esophagitis: Secondary | ICD-10-CM | POA: Diagnosis not present

## 2017-09-09 DIAGNOSIS — G20 Parkinson's disease: Secondary | ICD-10-CM | POA: Diagnosis not present

## 2017-09-09 DIAGNOSIS — I10 Essential (primary) hypertension: Secondary | ICD-10-CM | POA: Diagnosis not present

## 2017-09-09 DIAGNOSIS — J441 Chronic obstructive pulmonary disease with (acute) exacerbation: Secondary | ICD-10-CM | POA: Diagnosis not present

## 2017-09-10 DIAGNOSIS — J441 Chronic obstructive pulmonary disease with (acute) exacerbation: Secondary | ICD-10-CM | POA: Diagnosis not present

## 2017-09-10 DIAGNOSIS — E1142 Type 2 diabetes mellitus with diabetic polyneuropathy: Secondary | ICD-10-CM | POA: Diagnosis not present

## 2017-09-10 DIAGNOSIS — I10 Essential (primary) hypertension: Secondary | ICD-10-CM | POA: Diagnosis not present

## 2017-09-10 DIAGNOSIS — G20 Parkinson's disease: Secondary | ICD-10-CM | POA: Diagnosis not present

## 2017-09-10 DIAGNOSIS — S2241XD Multiple fractures of ribs, right side, subsequent encounter for fracture with routine healing: Secondary | ICD-10-CM | POA: Diagnosis not present

## 2017-09-10 DIAGNOSIS — K219 Gastro-esophageal reflux disease without esophagitis: Secondary | ICD-10-CM | POA: Diagnosis not present

## 2017-09-13 DIAGNOSIS — E1142 Type 2 diabetes mellitus with diabetic polyneuropathy: Secondary | ICD-10-CM | POA: Diagnosis not present

## 2017-09-13 DIAGNOSIS — J441 Chronic obstructive pulmonary disease with (acute) exacerbation: Secondary | ICD-10-CM | POA: Diagnosis not present

## 2017-09-13 DIAGNOSIS — I10 Essential (primary) hypertension: Secondary | ICD-10-CM | POA: Diagnosis not present

## 2017-09-13 DIAGNOSIS — G20 Parkinson's disease: Secondary | ICD-10-CM | POA: Diagnosis not present

## 2017-09-13 DIAGNOSIS — S2241XD Multiple fractures of ribs, right side, subsequent encounter for fracture with routine healing: Secondary | ICD-10-CM | POA: Diagnosis not present

## 2017-09-13 DIAGNOSIS — K219 Gastro-esophageal reflux disease without esophagitis: Secondary | ICD-10-CM | POA: Diagnosis not present

## 2017-09-14 DIAGNOSIS — G20 Parkinson's disease: Secondary | ICD-10-CM | POA: Diagnosis not present

## 2017-09-14 DIAGNOSIS — K219 Gastro-esophageal reflux disease without esophagitis: Secondary | ICD-10-CM | POA: Diagnosis not present

## 2017-09-14 DIAGNOSIS — J441 Chronic obstructive pulmonary disease with (acute) exacerbation: Secondary | ICD-10-CM | POA: Diagnosis not present

## 2017-09-14 DIAGNOSIS — E1142 Type 2 diabetes mellitus with diabetic polyneuropathy: Secondary | ICD-10-CM | POA: Diagnosis not present

## 2017-09-14 DIAGNOSIS — S2241XD Multiple fractures of ribs, right side, subsequent encounter for fracture with routine healing: Secondary | ICD-10-CM | POA: Diagnosis not present

## 2017-09-14 DIAGNOSIS — I10 Essential (primary) hypertension: Secondary | ICD-10-CM | POA: Diagnosis not present

## 2017-09-15 DIAGNOSIS — E1142 Type 2 diabetes mellitus with diabetic polyneuropathy: Secondary | ICD-10-CM | POA: Diagnosis not present

## 2017-09-15 DIAGNOSIS — K219 Gastro-esophageal reflux disease without esophagitis: Secondary | ICD-10-CM | POA: Diagnosis not present

## 2017-09-15 DIAGNOSIS — S2241XD Multiple fractures of ribs, right side, subsequent encounter for fracture with routine healing: Secondary | ICD-10-CM | POA: Diagnosis not present

## 2017-09-15 DIAGNOSIS — J441 Chronic obstructive pulmonary disease with (acute) exacerbation: Secondary | ICD-10-CM | POA: Diagnosis not present

## 2017-09-15 DIAGNOSIS — G20 Parkinson's disease: Secondary | ICD-10-CM | POA: Diagnosis not present

## 2017-09-15 DIAGNOSIS — I10 Essential (primary) hypertension: Secondary | ICD-10-CM | POA: Diagnosis not present

## 2017-09-16 ENCOUNTER — Other Ambulatory Visit: Payer: Self-pay

## 2017-09-16 NOTE — Patient Outreach (Signed)
[facility]) Care Management   09/16/2017  Kristen Mason [id number]  Kristen Mason is an 83 y.o. female  Subjective: client reports [facility] therapist has discharged her because they have been unable to work with her due to increased blood pressure.   Objective:  BP (!) 180/70 Comment: repeat 178/70  Pulse (!) 56   Resp 20   SpO2 95%   Review of Systems  Respiratory:       Lungs clear, decreased breath sounds.  Cardiovascular:       S1S2 noted, bradycardia.  Skin:       Small Bruise area noted left arm to right antecubital.    Physical Exam skin warm dry, color within normal limites.  Encounter Medications:   Outpatient Encounter Medications as of 09/16/2017  Medication Sig Note  . amLODipine (NORVASC) 5 MG tablet Take 5 mg by mouth daily.   Marland Kitchen aspirin 81 MG tablet Take 81 mg by mouth daily.   . carbidopa-levodopa (SINEMET IR) 25-100 MG tablet Take 1 tablet by mouth 3 (three) times daily.   . cetirizine (ZYRTEC) 10 MG tablet Take 10 mg by mouth daily.   . citalopram (CELEXA) 40 MG tablet Take 40 mg by mouth daily.   . clonazePAM (KLONOPIN) 1 MG tablet Take 1 tablet (1 mg total) by mouth at bedtime.   . dicyclomine (BENTYL) 10 MG capsule Take 10 mg by mouth 4 (four) times daily -  before meals and at bedtime.   . diphenhydrAMINE (SOMINEX) 25 MG tablet Take 25 mg by mouth at bedtime as needed for sleep.   Marland Kitchen gabapentin (NEURONTIN) 800 MG tablet Take 800 mg by mouth 3 (three) times daily.   Marland Kitchen guaiFENesin (MUCINEX) 600 MG 12 hr tablet Take 1 tablet (600 mg total) by mouth 2 (two) times daily.   . hydrOXYzine (ATARAX/VISTARIL) 25 MG tablet Take 25 mg by mouth at bedtime.   . Lactobacillus (PROBIOTIC ACIDOPHILUS PO) Take 1 tablet by mouth daily.   Marland Kitchen lisinopril (PRINIVIL,ZESTRIL) 2.5 MG tablet Take 2.5 mg by mouth daily. take 1 tab po daily for htn 09/03/2017: Per patient increased to 20mg  in the morning and 20mg  at night.  . Omega-3 Fatty Acids (FISH OIL)  1000 MG CPDR Take 1,000 mg by mouth daily.   . pantoprazole (PROTONIX) 40 MG tablet Take 40 mg by mouth daily.   . primidone (MYSOLINE) 250 MG tablet Take 250 mg by mouth 2 (two) times daily.   . traMADol (ULTRAM) 50 MG tablet Take 1 tablet (50 mg total) by mouth every 6 (six) hours as needed.   Marland Kitchen albuterol (PROVENTIL) (2.5 MG/3ML) 0.083% nebulizer solution Take 3 mLs (2.5 mg total) by nebulization every 4 (four) hours as needed for shortness of breath. (Patient not taking: Reported on 08/03/2017)   . feeding supplement, GLUCERNA SHAKE, (GLUCERNA SHAKE) LIQD Take 237 mLs by mouth 2 (two) times daily between meals. (Patient not taking: Reported on 09/16/2017)   . ipratropium-albuterol (DUONEB) 0.5-2.5 (3) MG/3ML SOLN Take 3 mLs by nebulization every 6 (six) hours. (Patient not taking: Reported on 08/03/2017)   . lidocaine (LIDODERM) 5 % Place 1 patch onto the skin daily. Remove & Discard patch within 12 hours or as directed by MD (Patient not taking: Reported on 08/19/2017)   . nicotine (NICODERM CQ - DOSED IN MG/24 HOURS) 14 mg/24hr patch Place 1 patch (14 mg total) onto the skin daily. (Patient not taking: Reported on 09/16/2017)   . oxyCODONE (ROXICODONE) 5 MG immediate  release tablet Take 1 tablet (5 mg total) by mouth every 6 (six) hours as needed for severe pain (ONLY IF PAIN IS NOT CONTROLLED WITH TRAMADL). (Patient not taking: Reported on 08/19/2017)    No facility-administered encounter medications on file as of 09/16/2017.     Functional Status:   In your present state of health, do you have any difficulty performing the following activities: 08/12/2017 06/08/2017  Hearing? N N  Vision? N N  Difficulty concentrating or making decisions? N N  Walking or climbing stairs? Y Y  Dressing or bathing? Y N  Doing errands, shopping? Kristen Mason  Preparing Food and eating ? Y -  Using the Toilet? N -  In the past six months, have you accidently leaked urine? Y -  Comment "When I cough, sometimes it leaks" -   Do you have problems with loss of bowel control? N -  Managing your Medications? Y -  Managing your Finances? Y -  Housekeeping or managing your Housekeeping? Y -  Some recent data might be hidden    Fall/Depression Screening:    Fall Risk  08/03/2017  Falls in the past year? Yes  Number falls in past yr: 2 or more  Injury with Fall? Yes  Risk Factor Category  High Fall Risk  Risk for fall due to : History of fall(s);Impaired balance/gait  Follow up Falls prevention discussed   PHQ 2/9 Scores 08/12/2017  PHQ - 2 Score 0    Assessment:  83 year old with recent admission with acute respiratory failure with hypoxia post fall. Client was discharge from hospital to skilled facility to assisted living facility then to home on 08/02/17. History of Mutliple rib fracture, fall, HTN, DM, GERD, Parkinson's, smoker, but has not smoked since hospital admission.  Client has been having issues with increased blood pressure. She reports that primary care has added amlodipine to her medication regimen. She states, "its only been two days since I started". She has a follow up appointment on August 30.  Active with [facility]. Client reports she has been discharged from therapy due to increased blood pressure, but will continue to have a home health nurse following her and a Veterinary surgeon. She reports they will be coming to see her on tomorrow.   Per records from [place]: 09/02/17 160/74 HR 52 09/03/17 190/74 HR 62 09/07/17 172/84 HR 60 09/09/17 176/78 HR 58  Today client's blood pressure increased 180/70 HR 56; repeat approximately 30 minutes later blood pressure 178/70. Client denies any other issues at this time.  Plan: update primary care. RNCM will transition to community care coordinator who is covering the area where she lives.  Kristen Silversmith, RN, MSN, [place] Coordinator Cell: [phone]

## 2017-09-17 ENCOUNTER — Other Ambulatory Visit: Payer: Self-pay

## 2017-09-17 DIAGNOSIS — J441 Chronic obstructive pulmonary disease with (acute) exacerbation: Secondary | ICD-10-CM | POA: Diagnosis not present

## 2017-09-17 DIAGNOSIS — S2241XD Multiple fractures of ribs, right side, subsequent encounter for fracture with routine healing: Secondary | ICD-10-CM | POA: Diagnosis not present

## 2017-09-17 DIAGNOSIS — I10 Essential (primary) hypertension: Secondary | ICD-10-CM | POA: Diagnosis not present

## 2017-09-17 DIAGNOSIS — G20 Parkinson's disease: Secondary | ICD-10-CM | POA: Diagnosis not present

## 2017-09-17 DIAGNOSIS — E1142 Type 2 diabetes mellitus with diabetic polyneuropathy: Secondary | ICD-10-CM | POA: Diagnosis not present

## 2017-09-17 DIAGNOSIS — K219 Gastro-esophageal reflux disease without esophagitis: Secondary | ICD-10-CM | POA: Diagnosis not present

## 2017-09-17 NOTE — Patient Outreach (Signed)
[facility]) Care Management  09/17/2017  Kristen Mason 30-Oct-1934 [id number]   Care Coordination: RNCM called to notify primary care of client's blood pressure during home visit. Voice message left. Also notified that RNCM's note was routed to primary care.   Plan: continue to follow.  Thea Silversmith, RN, MSN, [place] Coordinator Cell: [phone]

## 2017-09-20 ENCOUNTER — Other Ambulatory Visit: Payer: Self-pay | Admitting: *Deleted

## 2017-09-20 DIAGNOSIS — J441 Chronic obstructive pulmonary disease with (acute) exacerbation: Secondary | ICD-10-CM | POA: Diagnosis not present

## 2017-09-20 DIAGNOSIS — Z9181 History of falling: Secondary | ICD-10-CM | POA: Diagnosis not present

## 2017-09-20 DIAGNOSIS — K219 Gastro-esophageal reflux disease without esophagitis: Secondary | ICD-10-CM | POA: Diagnosis not present

## 2017-09-20 DIAGNOSIS — E1142 Type 2 diabetes mellitus with diabetic polyneuropathy: Secondary | ICD-10-CM | POA: Diagnosis not present

## 2017-09-20 DIAGNOSIS — F17210 Nicotine dependence, cigarettes, uncomplicated: Secondary | ICD-10-CM | POA: Diagnosis not present

## 2017-09-20 DIAGNOSIS — R32 Unspecified urinary incontinence: Secondary | ICD-10-CM | POA: Diagnosis not present

## 2017-09-20 DIAGNOSIS — I10 Essential (primary) hypertension: Secondary | ICD-10-CM | POA: Diagnosis not present

## 2017-09-20 DIAGNOSIS — S2241XD Multiple fractures of ribs, right side, subsequent encounter for fracture with routine healing: Secondary | ICD-10-CM | POA: Diagnosis not present

## 2017-09-20 DIAGNOSIS — Z7982 Long term (current) use of aspirin: Secondary | ICD-10-CM | POA: Diagnosis not present

## 2017-09-20 DIAGNOSIS — Z79891 Long term (current) use of opiate analgesic: Secondary | ICD-10-CM | POA: Diagnosis not present

## 2017-09-20 DIAGNOSIS — F329 Major depressive disorder, single episode, unspecified: Secondary | ICD-10-CM | POA: Diagnosis not present

## 2017-09-20 DIAGNOSIS — F419 Anxiety disorder, unspecified: Secondary | ICD-10-CM | POA: Diagnosis not present

## 2017-09-20 DIAGNOSIS — G20 Parkinson's disease: Secondary | ICD-10-CM | POA: Diagnosis not present

## 2017-09-20 NOTE — Patient Outreach (Signed)
[place] [facility]) Care Management  09/20/2017  Kristen Mason 21-Apr-1934 [id number]   Referral received from care manager due to relocation.  She has been active with community care Fre[employer] forward[employer], home visit on 8/22, concern about elevated blood pressure.  She report she was seen by her MD and some medications were changed, however report blood pressure still elevated. She has follow up this Friday to assess trend after medication changes.  Advised to continue to monitor daily and report trends to MD.  She verbalizes understanding.  Home visit scheduled for next month.  [name], [place], MSN [place] [phone]

## 2017-09-21 ENCOUNTER — Other Ambulatory Visit: Payer: Self-pay | Admitting: *Deleted

## 2017-09-21 NOTE — Patient Outreach (Signed)
[facility] [facility]) Care Management  09/21/2017  Sadira Standard [birth year] [id number]   Call received from member requesting assistance with contacting home health agency regarding nursing and aide involvement.  She state PT/OT has stopped due to increased blood pressure, but was to continue with nursing.  She has called [facility] but has not received a call back.  Call placed to [facility] to inquire about member's concern.  Notified that Hinton Dyer would be the nurse and she is attempting to get an order from [facility] MD.  [facility] will have Hinton Dyer reach to member regarding process.  Member notified, verbalized understanding. Will contact this care manager if she has any further questions.  [name], [place], MSN [place] [phone]

## 2017-09-23 DIAGNOSIS — G20 Parkinson's disease: Secondary | ICD-10-CM | POA: Diagnosis not present

## 2017-09-23 DIAGNOSIS — S2241XD Multiple fractures of ribs, right side, subsequent encounter for fracture with routine healing: Secondary | ICD-10-CM | POA: Diagnosis not present

## 2017-09-23 DIAGNOSIS — F329 Major depressive disorder, single episode, unspecified: Secondary | ICD-10-CM | POA: Diagnosis not present

## 2017-09-23 DIAGNOSIS — J441 Chronic obstructive pulmonary disease with (acute) exacerbation: Secondary | ICD-10-CM | POA: Diagnosis not present

## 2017-09-23 DIAGNOSIS — E1142 Type 2 diabetes mellitus with diabetic polyneuropathy: Secondary | ICD-10-CM | POA: Diagnosis not present

## 2017-09-23 DIAGNOSIS — I10 Essential (primary) hypertension: Secondary | ICD-10-CM | POA: Diagnosis not present

## 2017-09-24 DIAGNOSIS — L299 Pruritus, unspecified: Secondary | ICD-10-CM | POA: Diagnosis not present

## 2017-09-24 DIAGNOSIS — G20 Parkinson's disease: Secondary | ICD-10-CM | POA: Diagnosis not present

## 2017-09-24 DIAGNOSIS — D699 Hemorrhagic condition, unspecified: Secondary | ICD-10-CM | POA: Diagnosis not present

## 2017-09-24 DIAGNOSIS — I10 Essential (primary) hypertension: Secondary | ICD-10-CM | POA: Diagnosis not present

## 2017-09-27 DIAGNOSIS — F329 Major depressive disorder, single episode, unspecified: Secondary | ICD-10-CM | POA: Diagnosis not present

## 2017-09-27 DIAGNOSIS — S2241XD Multiple fractures of ribs, right side, subsequent encounter for fracture with routine healing: Secondary | ICD-10-CM | POA: Diagnosis not present

## 2017-09-27 DIAGNOSIS — G20 Parkinson's disease: Secondary | ICD-10-CM | POA: Diagnosis not present

## 2017-09-27 DIAGNOSIS — I10 Essential (primary) hypertension: Secondary | ICD-10-CM | POA: Diagnosis not present

## 2017-09-27 DIAGNOSIS — E1142 Type 2 diabetes mellitus with diabetic polyneuropathy: Secondary | ICD-10-CM | POA: Diagnosis not present

## 2017-09-27 DIAGNOSIS — J441 Chronic obstructive pulmonary disease with (acute) exacerbation: Secondary | ICD-10-CM | POA: Diagnosis not present

## 2017-09-28 DIAGNOSIS — J441 Chronic obstructive pulmonary disease with (acute) exacerbation: Secondary | ICD-10-CM | POA: Diagnosis not present

## 2017-09-28 DIAGNOSIS — F329 Major depressive disorder, single episode, unspecified: Secondary | ICD-10-CM | POA: Diagnosis not present

## 2017-09-28 DIAGNOSIS — S2241XD Multiple fractures of ribs, right side, subsequent encounter for fracture with routine healing: Secondary | ICD-10-CM | POA: Diagnosis not present

## 2017-09-28 DIAGNOSIS — I10 Essential (primary) hypertension: Secondary | ICD-10-CM | POA: Diagnosis not present

## 2017-09-28 DIAGNOSIS — G20 Parkinson's disease: Secondary | ICD-10-CM | POA: Diagnosis not present

## 2017-09-28 DIAGNOSIS — E1142 Type 2 diabetes mellitus with diabetic polyneuropathy: Secondary | ICD-10-CM | POA: Diagnosis not present

## 2017-09-29 DIAGNOSIS — G20 Parkinson's disease: Secondary | ICD-10-CM | POA: Diagnosis not present

## 2017-09-29 DIAGNOSIS — F329 Major depressive disorder, single episode, unspecified: Secondary | ICD-10-CM | POA: Diagnosis not present

## 2017-09-29 DIAGNOSIS — J441 Chronic obstructive pulmonary disease with (acute) exacerbation: Secondary | ICD-10-CM | POA: Diagnosis not present

## 2017-09-29 DIAGNOSIS — E1142 Type 2 diabetes mellitus with diabetic polyneuropathy: Secondary | ICD-10-CM | POA: Diagnosis not present

## 2017-09-29 DIAGNOSIS — S2241XD Multiple fractures of ribs, right side, subsequent encounter for fracture with routine healing: Secondary | ICD-10-CM | POA: Diagnosis not present

## 2017-09-29 DIAGNOSIS — I10 Essential (primary) hypertension: Secondary | ICD-10-CM | POA: Diagnosis not present

## 2017-10-01 DIAGNOSIS — S2241XD Multiple fractures of ribs, right side, subsequent encounter for fracture with routine healing: Secondary | ICD-10-CM | POA: Diagnosis not present

## 2017-10-01 DIAGNOSIS — F329 Major depressive disorder, single episode, unspecified: Secondary | ICD-10-CM | POA: Diagnosis not present

## 2017-10-01 DIAGNOSIS — E1142 Type 2 diabetes mellitus with diabetic polyneuropathy: Secondary | ICD-10-CM | POA: Diagnosis not present

## 2017-10-01 DIAGNOSIS — I10 Essential (primary) hypertension: Secondary | ICD-10-CM | POA: Diagnosis not present

## 2017-10-01 DIAGNOSIS — G20 Parkinson's disease: Secondary | ICD-10-CM | POA: Diagnosis not present

## 2017-10-01 DIAGNOSIS — J441 Chronic obstructive pulmonary disease with (acute) exacerbation: Secondary | ICD-10-CM | POA: Diagnosis not present

## 2017-10-05 DIAGNOSIS — E1142 Type 2 diabetes mellitus with diabetic polyneuropathy: Secondary | ICD-10-CM | POA: Diagnosis not present

## 2017-10-05 DIAGNOSIS — S2241XD Multiple fractures of ribs, right side, subsequent encounter for fracture with routine healing: Secondary | ICD-10-CM | POA: Diagnosis not present

## 2017-10-05 DIAGNOSIS — F329 Major depressive disorder, single episode, unspecified: Secondary | ICD-10-CM | POA: Diagnosis not present

## 2017-10-05 DIAGNOSIS — J441 Chronic obstructive pulmonary disease with (acute) exacerbation: Secondary | ICD-10-CM | POA: Diagnosis not present

## 2017-10-05 DIAGNOSIS — I10 Essential (primary) hypertension: Secondary | ICD-10-CM | POA: Diagnosis not present

## 2017-10-05 DIAGNOSIS — G20 Parkinson's disease: Secondary | ICD-10-CM | POA: Diagnosis not present

## 2017-10-06 DIAGNOSIS — S2241XD Multiple fractures of ribs, right side, subsequent encounter for fracture with routine healing: Secondary | ICD-10-CM | POA: Diagnosis not present

## 2017-10-06 DIAGNOSIS — G20 Parkinson's disease: Secondary | ICD-10-CM | POA: Diagnosis not present

## 2017-10-06 DIAGNOSIS — J441 Chronic obstructive pulmonary disease with (acute) exacerbation: Secondary | ICD-10-CM | POA: Diagnosis not present

## 2017-10-06 DIAGNOSIS — F329 Major depressive disorder, single episode, unspecified: Secondary | ICD-10-CM | POA: Diagnosis not present

## 2017-10-06 DIAGNOSIS — I10 Essential (primary) hypertension: Secondary | ICD-10-CM | POA: Diagnosis not present

## 2017-10-06 DIAGNOSIS — E1142 Type 2 diabetes mellitus with diabetic polyneuropathy: Secondary | ICD-10-CM | POA: Diagnosis not present

## 2017-10-07 DIAGNOSIS — G20 Parkinson's disease: Secondary | ICD-10-CM | POA: Diagnosis not present

## 2017-10-07 DIAGNOSIS — I10 Essential (primary) hypertension: Secondary | ICD-10-CM | POA: Diagnosis not present

## 2017-10-07 DIAGNOSIS — E1142 Type 2 diabetes mellitus with diabetic polyneuropathy: Secondary | ICD-10-CM | POA: Diagnosis not present

## 2017-10-07 DIAGNOSIS — J441 Chronic obstructive pulmonary disease with (acute) exacerbation: Secondary | ICD-10-CM | POA: Diagnosis not present

## 2017-10-07 DIAGNOSIS — S2241XD Multiple fractures of ribs, right side, subsequent encounter for fracture with routine healing: Secondary | ICD-10-CM | POA: Diagnosis not present

## 2017-10-07 DIAGNOSIS — F329 Major depressive disorder, single episode, unspecified: Secondary | ICD-10-CM | POA: Diagnosis not present

## 2017-10-08 DIAGNOSIS — S2241XD Multiple fractures of ribs, right side, subsequent encounter for fracture with routine healing: Secondary | ICD-10-CM | POA: Diagnosis not present

## 2017-10-08 DIAGNOSIS — I10 Essential (primary) hypertension: Secondary | ICD-10-CM | POA: Diagnosis not present

## 2017-10-08 DIAGNOSIS — J441 Chronic obstructive pulmonary disease with (acute) exacerbation: Secondary | ICD-10-CM | POA: Diagnosis not present

## 2017-10-08 DIAGNOSIS — E1142 Type 2 diabetes mellitus with diabetic polyneuropathy: Secondary | ICD-10-CM | POA: Diagnosis not present

## 2017-10-08 DIAGNOSIS — G20 Parkinson's disease: Secondary | ICD-10-CM | POA: Diagnosis not present

## 2017-10-08 DIAGNOSIS — F329 Major depressive disorder, single episode, unspecified: Secondary | ICD-10-CM | POA: Diagnosis not present

## 2017-10-11 ENCOUNTER — Other Ambulatory Visit: Payer: Self-pay | Admitting: *Deleted

## 2017-10-11 DIAGNOSIS — S2241XD Multiple fractures of ribs, right side, subsequent encounter for fracture with routine healing: Secondary | ICD-10-CM | POA: Diagnosis not present

## 2017-10-11 DIAGNOSIS — G20 Parkinson's disease: Secondary | ICD-10-CM | POA: Diagnosis not present

## 2017-10-11 DIAGNOSIS — I10 Essential (primary) hypertension: Secondary | ICD-10-CM | POA: Diagnosis not present

## 2017-10-11 DIAGNOSIS — J441 Chronic obstructive pulmonary disease with (acute) exacerbation: Secondary | ICD-10-CM | POA: Diagnosis not present

## 2017-10-11 DIAGNOSIS — E1142 Type 2 diabetes mellitus with diabetic polyneuropathy: Secondary | ICD-10-CM | POA: Diagnosis not present

## 2017-10-11 DIAGNOSIS — F329 Major depressive disorder, single episode, unspecified: Secondary | ICD-10-CM | POA: Diagnosis not present

## 2017-10-11 NOTE — Patient Outreach (Signed)
[facility]) Care Management   10/11/2017  Kristen Mason Aug 26, 1934 [id number]  Kristen Mason is an 83 y.o. female  Subjective:   Member alert and oriented x3, complains of chronic pain in her neck, report she used to get injections to help wit pain related to degenerative disc disease but has not had them since moving to [place]. She does have appointment next month with neurologist, hoping to have injections restarted.  Objective:   Review of Systems  Constitutional: Negative.   HENT: Negative.   Eyes: Negative.   Respiratory: Negative.   Cardiovascular: Negative.   Gastrointestinal: Negative.   Genitourinary: Negative.   Musculoskeletal: Negative.   Skin: Negative.   Neurological: Positive for tremors.  Endo/Heme/Allergies: Negative.   Psychiatric/Behavioral: Negative.     Physical Exam  Constitutional: She is oriented to person, place, and time. She appears well-developed and well-nourished.  Neck: Normal range of motion.  Cardiovascular: Normal rate, regular rhythm and normal heart sounds.  Respiratory: Effort normal and breath sounds normal.  GI: Soft. Bowel sounds are normal.  Musculoskeletal: Normal range of motion.  Neurological: She is alert and oriented to person, place, and time.  Skin: Skin is warm and dry.   BP (!) 146/58 (BP Location: Right Arm, Patient Position: Sitting, Cuff Size: Normal)   Pulse 64   Resp 20   SpO2 95%   Encounter Medications:   Outpatient Encounter Medications as of 10/11/2017  Medication Sig Note  . amLODipine (NORVASC) 5 MG tablet Take 5 mg by mouth daily.   . carbidopa-levodopa (SINEMET IR) 25-100 MG tablet Take 1 tablet by mouth 3 (three) times daily.   . cetirizine (ZYRTEC) 10 MG tablet Take 10 mg by mouth daily.   . citalopram (CELEXA) 40 MG tablet Take 40 mg by mouth daily.   . clonazePAM (KLONOPIN) 1 MG tablet Take 1 tablet (1 mg total) by mouth at bedtime.   . dicyclomine (BENTYL) 10 MG capsule Take  10 mg by mouth 4 (four) times daily -  before meals and at bedtime.   . diphenhydrAMINE (SOMINEX) 25 MG tablet Take 25 mg by mouth at bedtime as needed for sleep.   Marland Kitchen gabapentin (NEURONTIN) 800 MG tablet Take 800 mg by mouth 3 (three) times daily.   Marland Kitchen guaiFENesin (MUCINEX) 600 MG 12 hr tablet Take 1 tablet (600 mg total) by mouth 2 (two) times daily.   . hydrOXYzine (ATARAX/VISTARIL) 25 MG tablet Take 25 mg by mouth at bedtime.   . Lactobacillus (PROBIOTIC ACIDOPHILUS PO) Take 1 tablet by mouth daily.   Marland Kitchen lisinopril (PRINIVIL,ZESTRIL) 2.5 MG tablet Take 2.5 mg by mouth daily. take 1 tab po daily for htn 09/03/2017: Per patient increased to 52m in the morning and 272mat night.  . Omega-3 Fatty Acids (FISH OIL) 1000 MG CPDR Take 1,000 mg by mouth daily.   . pantoprazole (PROTONIX) 40 MG tablet Take 40 mg by mouth daily.   . primidone (MYSOLINE) 250 MG tablet Take 250 mg by mouth 2 (two) times daily.   . traMADol (ULTRAM) 50 MG tablet Take 1 tablet (50 mg total) by mouth every 6 (six) hours as needed.   . Marland Kitchenlbuterol (PROVENTIL) (2.5 MG/3ML) 0.083% nebulizer solution Take 3 mLs (2.5 mg total) by nebulization every 4 (four) hours as needed for shortness of breath. (Patient not taking: Reported on 08/03/2017)   . aspirin 81 MG tablet Take 81 mg by mouth daily.   . feeding supplement, GLUCERNA SHAKE, (GLUCERNA SHAKE) LIQD Take 237  mLs by mouth 2 (two) times daily between meals. (Patient not taking: Reported on 09/16/2017)   . ipratropium-albuterol (DUONEB) 0.5-2.5 (3) MG/3ML SOLN Take 3 mLs by nebulization every 6 (six) hours. (Patient not taking: Reported on 08/03/2017)   . lidocaine (LIDODERM) 5 % Place 1 patch onto the skin daily. Remove & Discard patch within 12 hours or as directed by MD (Patient not taking: Reported on 08/19/2017)   . nicotine (NICODERM CQ - DOSED IN MG/24 HOURS) 14 mg/24hr patch Place 1 patch (14 mg total) onto the skin daily. (Patient not taking: Reported on 09/16/2017)   . oxyCODONE  (ROXICODONE) 5 MG immediate release tablet Take 1 tablet (5 mg total) by mouth every 6 (six) hours as needed for severe pain (ONLY IF PAIN IS NOT CONTROLLED WITH TRAMADL). (Patient not taking: Reported on 08/19/2017)    No facility-administered encounter medications on file as of 10/11/2017.     Functional Status:   In your present state of health, do you have any difficulty performing the following activities: 08/12/2017 06/08/2017  Hearing? N N  Vision? N N  Difficulty concentrating or making decisions? N N  Walking or climbing stairs? Y Y  Dressing or bathing? Y N  Doing errands, shopping? [name]  Preparing Food and eating ? Y -  Using the Toilet? N -  In the past six months, have you accidently leaked urine? Y -  Comment "When I cough, sometimes it leaks" -  Do you have problems with loss of bowel control? N -  Managing your Medications? Y -  Managing your Finances? Y -  Housekeeping or managing your Housekeeping? Y -  Some recent data might be hidden    Fall/Depression Screening:    Fall Risk  08/03/2017  Falls in the past year? Yes  Number falls in past yr: 2 or more  Injury with Fall? Yes  Risk Factor Category  High Fall Risk  Risk for fall due to : History of fall(s);Impaired balance/gait  Follow up Falls prevention discussed   PHQ 2/9 Scores 08/12/2017  PHQ - 2 Score 0    Assessment:    Met with member at scheduled time.  State she had visit from [facility] nurse earlier as well as home health aide.  Expresses frustration regarding not being able to fully participate with their services (PT at times and OT) due to increased blood pressure readings.  She has had follow ups with primary MD who is not concerned as readings were not elevated in the office. Per member, MD feel elevated readings are related to tremors from her Parkison's.  Blood pressure taken today during a moment where tremors were minimized, readings only slightly elevated but not of concern.  She expresses  gratitude for having home health aide to come help with bathing and dressing, but also express frustration as this will end once home health services end.  She is in need of ongoing services but does not qualify for Medicaid.  She is aware that this is an out of pocket cost however this care manager will inquire about possible resources in the community for patients with Parkinsons'.    Denies any urgent concern, advised to contact with questions.  Provided with this care manager's contact information.  Plan:   Will follow up with member within the next month.  [facility] CM Care Plan Problem Three     Most Recent Value  Care Plan Problem Three  increase in blood pressure per client's verbalization.  Role Documenting the Problem Three  Care Management Coordinator  Care Plan for Problem Three  Not Active  [facility] CM Short Term Goal #2   follow up with provider as needed within the next 30 days.  [facility] CM Short Term Goal #2 Start Date  09/03/17  [facility] CM Short Term Goal #2 Met Date  10/11/17     [name], RN, MSN [place] Manager [phone]

## 2017-10-12 DIAGNOSIS — F329 Major depressive disorder, single episode, unspecified: Secondary | ICD-10-CM | POA: Diagnosis not present

## 2017-10-12 DIAGNOSIS — J441 Chronic obstructive pulmonary disease with (acute) exacerbation: Secondary | ICD-10-CM | POA: Diagnosis not present

## 2017-10-12 DIAGNOSIS — G20 Parkinson's disease: Secondary | ICD-10-CM | POA: Diagnosis not present

## 2017-10-12 DIAGNOSIS — S2241XD Multiple fractures of ribs, right side, subsequent encounter for fracture with routine healing: Secondary | ICD-10-CM | POA: Diagnosis not present

## 2017-10-12 DIAGNOSIS — E1142 Type 2 diabetes mellitus with diabetic polyneuropathy: Secondary | ICD-10-CM | POA: Diagnosis not present

## 2017-10-12 DIAGNOSIS — I10 Essential (primary) hypertension: Secondary | ICD-10-CM | POA: Diagnosis not present

## 2017-10-13 DIAGNOSIS — F329 Major depressive disorder, single episode, unspecified: Secondary | ICD-10-CM | POA: Diagnosis not present

## 2017-10-13 DIAGNOSIS — G20 Parkinson's disease: Secondary | ICD-10-CM | POA: Diagnosis not present

## 2017-10-13 DIAGNOSIS — S2241XD Multiple fractures of ribs, right side, subsequent encounter for fracture with routine healing: Secondary | ICD-10-CM | POA: Diagnosis not present

## 2017-10-13 DIAGNOSIS — E1142 Type 2 diabetes mellitus with diabetic polyneuropathy: Secondary | ICD-10-CM | POA: Diagnosis not present

## 2017-10-13 DIAGNOSIS — J441 Chronic obstructive pulmonary disease with (acute) exacerbation: Secondary | ICD-10-CM | POA: Diagnosis not present

## 2017-10-13 DIAGNOSIS — I10 Essential (primary) hypertension: Secondary | ICD-10-CM | POA: Diagnosis not present

## 2017-10-14 DIAGNOSIS — S2241XD Multiple fractures of ribs, right side, subsequent encounter for fracture with routine healing: Secondary | ICD-10-CM | POA: Diagnosis not present

## 2017-10-14 DIAGNOSIS — I10 Essential (primary) hypertension: Secondary | ICD-10-CM | POA: Diagnosis not present

## 2017-10-14 DIAGNOSIS — F329 Major depressive disorder, single episode, unspecified: Secondary | ICD-10-CM | POA: Diagnosis not present

## 2017-10-14 DIAGNOSIS — E1142 Type 2 diabetes mellitus with diabetic polyneuropathy: Secondary | ICD-10-CM | POA: Diagnosis not present

## 2017-10-14 DIAGNOSIS — G20 Parkinson's disease: Secondary | ICD-10-CM | POA: Diagnosis not present

## 2017-10-14 DIAGNOSIS — J441 Chronic obstructive pulmonary disease with (acute) exacerbation: Secondary | ICD-10-CM | POA: Diagnosis not present

## 2017-10-15 DIAGNOSIS — S2241XD Multiple fractures of ribs, right side, subsequent encounter for fracture with routine healing: Secondary | ICD-10-CM | POA: Diagnosis not present

## 2017-10-15 DIAGNOSIS — E1142 Type 2 diabetes mellitus with diabetic polyneuropathy: Secondary | ICD-10-CM | POA: Diagnosis not present

## 2017-10-15 DIAGNOSIS — J441 Chronic obstructive pulmonary disease with (acute) exacerbation: Secondary | ICD-10-CM | POA: Diagnosis not present

## 2017-10-15 DIAGNOSIS — F329 Major depressive disorder, single episode, unspecified: Secondary | ICD-10-CM | POA: Diagnosis not present

## 2017-10-15 DIAGNOSIS — G20 Parkinson's disease: Secondary | ICD-10-CM | POA: Diagnosis not present

## 2017-10-15 DIAGNOSIS — I10 Essential (primary) hypertension: Secondary | ICD-10-CM | POA: Diagnosis not present

## 2017-10-19 DIAGNOSIS — G20 Parkinson's disease: Secondary | ICD-10-CM | POA: Diagnosis not present

## 2017-10-19 DIAGNOSIS — I10 Essential (primary) hypertension: Secondary | ICD-10-CM | POA: Diagnosis not present

## 2017-10-19 DIAGNOSIS — F329 Major depressive disorder, single episode, unspecified: Secondary | ICD-10-CM | POA: Diagnosis not present

## 2017-10-19 DIAGNOSIS — S2241XD Multiple fractures of ribs, right side, subsequent encounter for fracture with routine healing: Secondary | ICD-10-CM | POA: Diagnosis not present

## 2017-10-19 DIAGNOSIS — E1142 Type 2 diabetes mellitus with diabetic polyneuropathy: Secondary | ICD-10-CM | POA: Diagnosis not present

## 2017-10-19 DIAGNOSIS — J441 Chronic obstructive pulmonary disease with (acute) exacerbation: Secondary | ICD-10-CM | POA: Diagnosis not present

## 2017-10-21 DIAGNOSIS — J441 Chronic obstructive pulmonary disease with (acute) exacerbation: Secondary | ICD-10-CM | POA: Diagnosis not present

## 2017-10-21 DIAGNOSIS — F329 Major depressive disorder, single episode, unspecified: Secondary | ICD-10-CM | POA: Diagnosis not present

## 2017-10-21 DIAGNOSIS — G20 Parkinson's disease: Secondary | ICD-10-CM | POA: Diagnosis not present

## 2017-10-21 DIAGNOSIS — S2241XD Multiple fractures of ribs, right side, subsequent encounter for fracture with routine healing: Secondary | ICD-10-CM | POA: Diagnosis not present

## 2017-10-21 DIAGNOSIS — I10 Essential (primary) hypertension: Secondary | ICD-10-CM | POA: Diagnosis not present

## 2017-10-21 DIAGNOSIS — E1142 Type 2 diabetes mellitus with diabetic polyneuropathy: Secondary | ICD-10-CM | POA: Diagnosis not present

## 2017-10-26 DIAGNOSIS — I10 Essential (primary) hypertension: Secondary | ICD-10-CM | POA: Diagnosis not present

## 2017-10-26 DIAGNOSIS — G20 Parkinson's disease: Secondary | ICD-10-CM | POA: Diagnosis not present

## 2017-10-26 DIAGNOSIS — J441 Chronic obstructive pulmonary disease with (acute) exacerbation: Secondary | ICD-10-CM | POA: Diagnosis not present

## 2017-10-26 DIAGNOSIS — E1142 Type 2 diabetes mellitus with diabetic polyneuropathy: Secondary | ICD-10-CM | POA: Diagnosis not present

## 2017-10-26 DIAGNOSIS — S2241XD Multiple fractures of ribs, right side, subsequent encounter for fracture with routine healing: Secondary | ICD-10-CM | POA: Diagnosis not present

## 2017-10-26 DIAGNOSIS — F329 Major depressive disorder, single episode, unspecified: Secondary | ICD-10-CM | POA: Diagnosis not present

## 2017-10-28 ENCOUNTER — Ambulatory Visit (INDEPENDENT_AMBULATORY_CARE_PROVIDER_SITE_OTHER): Payer: Medicare Other | Admitting: Neurology

## 2017-10-28 ENCOUNTER — Encounter: Payer: Self-pay | Admitting: Neurology

## 2017-10-28 VITALS — BP 193/77 | HR 61 | Ht 65.0 in | Wt 166.0 lb

## 2017-10-28 DIAGNOSIS — Z9181 History of falling: Secondary | ICD-10-CM

## 2017-10-28 DIAGNOSIS — G250 Essential tremor: Secondary | ICD-10-CM

## 2017-10-28 NOTE — Progress Notes (Signed)
Subjective:    Patient ID: Kristen Mason is a 83 y.o. female.  HPI     Star Age, MD, PhD [facility] [address], [address] [place], [place] [address]  Dear Dr. Deforest Hoyles,   I saw your patient, Kristen Mason, upon your kind request, in my neurologic clinic today for initial consultation of her tremor and parkinsonism. The patient is accompanied by her youngest daughter today. As you know, Ms. Cuyler is an 83 year old right-handed woman with an underlying complex medical history of chronic kidney disease, hyperlipidemia, history of goiter, smoking, COPD, diabetes, neuropathy, peptic ulcer disease, recent admission in May 2019 to the hospital after a fall with rib fractures and hypoxic respiratory failure requiring oxygen, who was diagnosed with essential tremor as well as parkinsonism. She has previously seen Dr. Carles Collet and I reviewed her note from 03/25/2015.  She used to see a neurologist in [place], in [place] and [place], [place]. Prior neurologic records are not available for my review today. She has received Botox injections into her neck and reports that it helped immediately, she was able to walk out the clinic without head tremor. She has a long-standing history of hand tremors, right hand seems a little worse than left, she has some years ago developed a head tremor. Tremor has been progressive. Her father had a tremor. She lost her only brother at age 76 after a stroke. He did not have a history of tremor. She has 3 daughters, none with tremor issues as I understand. I reviewed your office note from 08/18/2017. Of note, she is currently on multiple potentially sedating medications including high-dose primidone 250 mg twice a day, she is also on a higher dose of clonazepam 1 mg strength 1-1/2 pills at night, tramadol 50 mg as needed 3 times a day. She is also on high-dose gabapentin 800 mg 3 times a day, hydroxyzine 25 mg as needed at bedtime, Celexa 40 mg  daily. She is on Sinemet generic 1 pill 3 times a day. She lives with her youngest daughter, her other daughters live in [place]. She quit smoking in May 2019 after her hospitalization. She fell and fractured her ribs. She no longer requires oxygen. She does not utilize alcohol, she drinks caffeine in the form of coffee, 2 per day on average, tries to hydrate well with water and uses a rolling walker at all times and for longer distances they have a wheelchair available. She has fallen in the past. She has significant itching throughout her skin, she takes Benadryl twice daily for this and Atarax at night.  Her Past Medical History Is Significant For: Past Medical History:  Diagnosis Date  . Diabetes ([place])   . Essential tremor   . Fall 06/07/2017   rib fractures  . GERD (gastroesophageal reflux disease)   . Hypertension   . Peripheral neuropathy     Her Past Surgical History Is Significant For: Past Surgical History:  Procedure Laterality Date  . ABDOMINAL HYSTERECTOMY  1975  . BREAST LUMPECTOMY Right    non cancerous    Her Family History Is Significant For: No family history on file.  Her Social History Is Significant For: Social History   Socioeconomic History  . Marital status: Unknown    Spouse name: Not on file  . Number of children: Not on file  . Years of education: Not on file  . Highest education level: Not on file  Occupational History  . Occupation: retired  Scientific laboratory technician  .  Financial resource strain: Not on file  . Food insecurity:    Worry: Not on file    Inability: Not on file  . Transportation needs:    Medical: Not on file    Non-medical: Not on file  Tobacco Use  . Smoking status: Current Every Day Smoker    Packs/day: 1.00    Years: 62.00    Pack years: 62.00    Types: Cigarettes  . Smokeless tobacco: Current User  Substance and Sexual Activity  . Alcohol use: No    Alcohol/week: 0.0 standard drinks  . Drug use: No  . Sexual activity: Not on  file  Lifestyle  . Physical activity:    Days per week: Not on file    Minutes per session: Not on file  . Stress: Not on file  Relationships  . Social connections:    Talks on phone: Not on file    Gets together: Not on file    Attends religious service: Not on file    Active member of club or organization: Not on file    Attends meetings of clubs or organizations: Not on file    Relationship status: Not on file  Other Topics Concern  . Not on file  Social History Narrative  . Not on file    Her Allergies Are:  Allergies  Allergen Reactions  . Statins Other (See Comments)  :   Her Current Medications Are:  Outpatient Encounter Medications as of 10/28/2017  Medication Sig  . amLODipine (NORVASC) 5 MG tablet Take 5 mg by mouth daily.  Marland Kitchen aspirin 81 MG tablet Take 81 mg by mouth daily.  . carbidopa-levodopa (SINEMET IR) 25-100 MG tablet Take 1 tablet by mouth 3 (three) times daily.  . cetirizine (ZYRTEC) 10 MG tablet Take 10 mg by mouth daily.  . citalopram (CELEXA) 40 MG tablet Take 40 mg by mouth daily.  . clonazePAM (KLONOPIN) 1 MG tablet Take 1 tablet (1 mg total) by mouth at bedtime.  . dicyclomine (BENTYL) 10 MG capsule Take 10 mg by mouth 4 (four) times daily - after meals and at bedtime.  . gabapentin (NEURONTIN) 800 MG tablet Take 800 mg by mouth 3 (three) times daily.  Marland Kitchen guaiFENesin (MUCINEX) 600 MG 12 hr tablet Take 1 tablet (600 mg total) by mouth 2 (two) times daily.  . hydrOXYzine (ATARAX/VISTARIL) 25 MG tablet Take 25 mg by mouth at bedtime.  Marland Kitchen lisinopril (PRINIVIL,ZESTRIL) 2.5 MG tablet Take 2.5 mg by mouth 2 (two) times daily. take 1 tab po daily for htn   . Omega-3 Fatty Acids (FISH OIL) 1000 MG CPDR Take 1,000 mg by mouth daily.  . pantoprazole (PROTONIX) 40 MG tablet Take 40 mg by mouth daily.  . primidone (MYSOLINE) 250 MG tablet Take 250 mg by mouth 2 (two) times daily.  . traMADol (ULTRAM) 50 MG tablet Take 1 tablet (50 mg total) by mouth every 6 (six)  hours as needed.  . [DISCONTINUED] albuterol (PROVENTIL) (2.5 MG/3ML) 0.083% nebulizer solution Take 3 mLs (2.5 mg total) by nebulization every 4 (four) hours as needed for shortness of breath. (Patient not taking: Reported on 08/03/2017)  . [DISCONTINUED] dicyclomine (BENTYL) 10 MG capsule Take 10 mg by mouth 4 (four) times daily -  before meals and at bedtime.  . [DISCONTINUED] diphenhydrAMINE (SOMINEX) 25 MG tablet Take 25 mg by mouth at bedtime as needed for sleep.  . [DISCONTINUED] feeding supplement, GLUCERNA SHAKE, (GLUCERNA SHAKE) LIQD Take 237 mLs by mouth 2 (two)  times daily between meals. (Patient not taking: Reported on 09/16/2017)  . [DISCONTINUED] ipratropium-albuterol (DUONEB) 0.5-2.5 (3) MG/3ML SOLN Take 3 mLs by nebulization every 6 (six) hours. (Patient not taking: Reported on 08/03/2017)  . [DISCONTINUED] Lactobacillus (PROBIOTIC ACIDOPHILUS PO) Take 1 tablet by mouth daily.  . [DISCONTINUED] lidocaine (LIDODERM) 5 % Place 1 patch onto the skin daily. Remove & Discard patch within 12 hours or as directed by MD (Patient not taking: Reported on 08/19/2017)  . [DISCONTINUED] nicotine (NICODERM CQ - DOSED IN MG/24 HOURS) 14 mg/24hr patch Place 1 patch (14 mg total) onto the skin daily. (Patient not taking: Reported on 09/16/2017)  . [DISCONTINUED] oxyCODONE (ROXICODONE) 5 MG immediate release tablet Take 1 tablet (5 mg total) by mouth every 6 (six) hours as needed for severe pain (ONLY IF PAIN IS NOT CONTROLLED WITH TRAMADL). (Patient not taking: Reported on 08/19/2017)   No facility-administered encounter medications on file as of 10/28/2017.   : Review of Systems:  Out of a complete 14 point review of systems, all are reviewed and negative with the exception of these symptoms as listed below: Review of Systems  Neurological:       Pt presents today to discuss her tremors. Pt has tremors throughout her whole body. Pt is right handed. Pt was getting botox in her neck which really helped. Pt  has seen Dr. Carles Collet in the past. Dr. Lysle Rubens is prescribing the sinemet and primidone for pt.    Objective:  Neurological Exam  Physical Exam Physical Examination:   Vitals:   10/28/17 1003  BP: (!) 193/77  Pulse: 61   General Examination: The patient is a very pleasant 83 y.o. female in no acute distress. She appears frail. Well groomed. Conversant.    HEENT: Normocephalic, atraumatic, pupils are equal, round and reactive to light and accommodation. Extraocular tracking is fairly well-preserved. Face is symmetric, no obvious facial masking noted. She has a voice tremor. She has a significant side-to-side head tremor. Neck appears mildly rigid, but she does have a difficult time relaxing. Airway examination reveals partially edentulous state. No hypophonia. Tongue protrudes centrally and palate elevates symmetrically.  Chest: Clear to auscultation without wheezing, rhonchi or crackles noted.  Heart: S1+S2+0, regular and normal without murmurs, rubs or gallops noted.   Abdomen: Soft, non-tender and non-distended with normal bowel sounds appreciated on auscultation.  Extremities: There is no pitting edema in the distal lower extremities bilaterally. Pedal pulses are intact.  Skin: Warm and dry with Multiple papular lesions, with scratch marks and some with open wounds.  Musculoskeletal: exam reveals arthritic changes in both hands.   Neurologically:  Mental status: The patient is awake, alert and oriented in all 4 spheres. Her immediate and remote memory, attention, language skills and fund of knowledge are appropriate. There is no evidence of aphasia, agnosia, apraxia or anomia. Speech is clear with normal prosody and enunciation. Thought process is linear. Mood is normal and affect is normal.  Cranial nerves II - XII are as described above under HEENT exam. In addition: shoulder shrug is normal with equal shoulder height noted. Motor exam: thin bulk, Global strength of 4+ out of 5,  fairly normal tone in the extremities but she does have a hard time relaxing.  On 10/28/2017: on Archimedes spiral drawing she has severe trembling with both hands, she was not able to write. She has a moderate bilateral upper extremity postural and action tremor. She has a mild resting component in both hands, right more than left.  She has no foot tremor.  Romberg is not tested due to safety concerns. Reflexes are 1+, but absent in the ankles. Fine motor skills and coordination: she has difficulty with fine motor skills but no decrement in amplitude with foot taps are finger taps. She has arthritic findings in both hands.  Cerebellar testing: No dysmetria or intention tremor on finger to nose testing. Heel to shin is unremarkable bilaterally. There is no truncal or gait ataxia.  Sensory exam: intact to light touch in the upper and lower extremities.  Gait, station and balance: She stands with mild difficulty and pushes herself up. She needs no assistance. She uses her rolling walker with seat, walks cautiously and slowly, no shuffling, no freezing, not stutter steps. She turns slowly.  Assessment and Plan:   In summary, Braelin Brosch is a very pleasant 83 y.o.-year old female with an underlying complex medical history of chronic kidney disease, hyperlipidemia, history of goiter, smoking, COPD, diabetes, neuropathy, peptic ulcer disease, recent admission in May 2019 to the hospital after a fall with rib fractures and hypoxic respiratory failure requiring oxygen, who presents for evaluation of her tremor and parkinsonism. Her history and examination are in keeping with advanced essential tremor. I do not see any telltale signs of parkinsonism. I explained my findings to her and her daughter. Unfortunately, there is not a whole lot I can offer her at this point. She has been on high-dose Mysoline for years. I would not feel comfortable increasing this dose, she can maintain the prescription through your  office. Furthermore, she is on Sinemet but I would recommend that she taper this off if possible, she is encouraged to discuss this with you. I would not recommend botulinum toxin injections in her case for fear of side effects, I would not feel comfortable injecting Botox and explained this to her. She is a risk and takes multiple potentially sedating medications, she is advised to seek consultation with a dermatologist for her itching and scratching. She has been taking Benadryl but is not sure if it's helping at this point. She is advised to stop using Benadryl as it had significant another medication that can affect her balance and increase her fall risk. She is advised to stay well hydrated, well rested, use her walker at all times. I would not consider her a good candidate for DBS for essential tremor. From my end of things I suggested as needed follow-up. I answered all their questions today and the patient and her daughter were in agreement. Thank you very much for allowing me to participate in the care of this nice patient. If I can be of any further assistance to you please do not hesitate to call me at [phone].  Sincerely,   Star Age, MD, PhD

## 2017-10-28 NOTE — Patient Instructions (Addendum)
You do have an advanced essential tremor.  Unfortunately, there is not a whole lot I can do.  Botox may not be safe to use. I would not feel comfortable injecting Botox.  You are already on multiple sedating medications and at fall risk.  Use your walker at all times.  Please stay well hydrated with water.  Please consider tapering the Sinemet, talk to Dr. Deforest Hoyles about it. I do not see tell-tale signs of parkinsonism.  I can see you back as needed.

## 2017-10-29 DIAGNOSIS — J441 Chronic obstructive pulmonary disease with (acute) exacerbation: Secondary | ICD-10-CM | POA: Diagnosis not present

## 2017-10-29 DIAGNOSIS — F329 Major depressive disorder, single episode, unspecified: Secondary | ICD-10-CM | POA: Diagnosis not present

## 2017-10-29 DIAGNOSIS — I10 Essential (primary) hypertension: Secondary | ICD-10-CM | POA: Diagnosis not present

## 2017-10-29 DIAGNOSIS — G20 Parkinson's disease: Secondary | ICD-10-CM | POA: Diagnosis not present

## 2017-10-29 DIAGNOSIS — E1142 Type 2 diabetes mellitus with diabetic polyneuropathy: Secondary | ICD-10-CM | POA: Diagnosis not present

## 2017-10-29 DIAGNOSIS — S2241XD Multiple fractures of ribs, right side, subsequent encounter for fracture with routine healing: Secondary | ICD-10-CM | POA: Diagnosis not present

## 2017-11-01 DIAGNOSIS — I10 Essential (primary) hypertension: Secondary | ICD-10-CM | POA: Diagnosis not present

## 2017-11-01 DIAGNOSIS — F411 Generalized anxiety disorder: Secondary | ICD-10-CM | POA: Diagnosis not present

## 2017-11-01 DIAGNOSIS — Z23 Encounter for immunization: Secondary | ICD-10-CM | POA: Diagnosis not present

## 2017-11-02 DIAGNOSIS — J441 Chronic obstructive pulmonary disease with (acute) exacerbation: Secondary | ICD-10-CM | POA: Diagnosis not present

## 2017-11-02 DIAGNOSIS — E1142 Type 2 diabetes mellitus with diabetic polyneuropathy: Secondary | ICD-10-CM | POA: Diagnosis not present

## 2017-11-02 DIAGNOSIS — G20 Parkinson's disease: Secondary | ICD-10-CM | POA: Diagnosis not present

## 2017-11-02 DIAGNOSIS — S2241XD Multiple fractures of ribs, right side, subsequent encounter for fracture with routine healing: Secondary | ICD-10-CM | POA: Diagnosis not present

## 2017-11-02 DIAGNOSIS — F329 Major depressive disorder, single episode, unspecified: Secondary | ICD-10-CM | POA: Diagnosis not present

## 2017-11-02 DIAGNOSIS — I10 Essential (primary) hypertension: Secondary | ICD-10-CM | POA: Diagnosis not present

## 2017-11-04 DIAGNOSIS — F329 Major depressive disorder, single episode, unspecified: Secondary | ICD-10-CM | POA: Diagnosis not present

## 2017-11-04 DIAGNOSIS — E1142 Type 2 diabetes mellitus with diabetic polyneuropathy: Secondary | ICD-10-CM | POA: Diagnosis not present

## 2017-11-04 DIAGNOSIS — I10 Essential (primary) hypertension: Secondary | ICD-10-CM | POA: Diagnosis not present

## 2017-11-04 DIAGNOSIS — J441 Chronic obstructive pulmonary disease with (acute) exacerbation: Secondary | ICD-10-CM | POA: Diagnosis not present

## 2017-11-04 DIAGNOSIS — S2241XD Multiple fractures of ribs, right side, subsequent encounter for fracture with routine healing: Secondary | ICD-10-CM | POA: Diagnosis not present

## 2017-11-04 DIAGNOSIS — G20 Parkinson's disease: Secondary | ICD-10-CM | POA: Diagnosis not present

## 2017-11-05 DIAGNOSIS — G20 Parkinson's disease: Secondary | ICD-10-CM | POA: Diagnosis not present

## 2017-11-05 DIAGNOSIS — S2241XD Multiple fractures of ribs, right side, subsequent encounter for fracture with routine healing: Secondary | ICD-10-CM | POA: Diagnosis not present

## 2017-11-05 DIAGNOSIS — F329 Major depressive disorder, single episode, unspecified: Secondary | ICD-10-CM | POA: Diagnosis not present

## 2017-11-05 DIAGNOSIS — I10 Essential (primary) hypertension: Secondary | ICD-10-CM | POA: Diagnosis not present

## 2017-11-05 DIAGNOSIS — J441 Chronic obstructive pulmonary disease with (acute) exacerbation: Secondary | ICD-10-CM | POA: Diagnosis not present

## 2017-11-05 DIAGNOSIS — E1142 Type 2 diabetes mellitus with diabetic polyneuropathy: Secondary | ICD-10-CM | POA: Diagnosis not present

## 2017-11-09 DIAGNOSIS — G20 Parkinson's disease: Secondary | ICD-10-CM | POA: Diagnosis not present

## 2017-11-09 DIAGNOSIS — E1142 Type 2 diabetes mellitus with diabetic polyneuropathy: Secondary | ICD-10-CM | POA: Diagnosis not present

## 2017-11-09 DIAGNOSIS — I10 Essential (primary) hypertension: Secondary | ICD-10-CM | POA: Diagnosis not present

## 2017-11-09 DIAGNOSIS — J441 Chronic obstructive pulmonary disease with (acute) exacerbation: Secondary | ICD-10-CM | POA: Diagnosis not present

## 2017-11-09 DIAGNOSIS — S2241XD Multiple fractures of ribs, right side, subsequent encounter for fracture with routine healing: Secondary | ICD-10-CM | POA: Diagnosis not present

## 2017-11-09 DIAGNOSIS — F329 Major depressive disorder, single episode, unspecified: Secondary | ICD-10-CM | POA: Diagnosis not present

## 2017-11-12 ENCOUNTER — Other Ambulatory Visit: Payer: Self-pay | Admitting: *Deleted

## 2017-11-12 DIAGNOSIS — S2241XD Multiple fractures of ribs, right side, subsequent encounter for fracture with routine healing: Secondary | ICD-10-CM | POA: Diagnosis not present

## 2017-11-12 DIAGNOSIS — F329 Major depressive disorder, single episode, unspecified: Secondary | ICD-10-CM | POA: Diagnosis not present

## 2017-11-12 DIAGNOSIS — E1142 Type 2 diabetes mellitus with diabetic polyneuropathy: Secondary | ICD-10-CM | POA: Diagnosis not present

## 2017-11-12 DIAGNOSIS — I10 Essential (primary) hypertension: Secondary | ICD-10-CM | POA: Diagnosis not present

## 2017-11-12 DIAGNOSIS — J441 Chronic obstructive pulmonary disease with (acute) exacerbation: Secondary | ICD-10-CM | POA: Diagnosis not present

## 2017-11-12 DIAGNOSIS — G20 Parkinson's disease: Secondary | ICD-10-CM | POA: Diagnosis not present

## 2017-11-12 NOTE — Patient Outreach (Signed)
[facility]) Care Management  11/12/2017  Lakecia Deschamps [birth year] [id number]   Call placed to member to follow up on current health status, no answer.  HIPAA compliant voice message left.  Will follow up within the next 4 business days.  [name], [place], MSN [place] [phone]

## 2017-11-16 ENCOUNTER — Other Ambulatory Visit: Payer: Self-pay | Admitting: *Deleted

## 2017-11-16 NOTE — Patient Outreach (Signed)
[facility]) Care Management  11/16/2017  Kristen Mason [birth year] [id number]   2nd unsuccessful attempt made to contact member.  HIPAA compliant voice message left.  Unsuccessful outreach letter sent, will follow up with member within the next 4 business days.  [name], [place], MSN [place] [phone]

## 2017-11-17 DIAGNOSIS — S2241XD Multiple fractures of ribs, right side, subsequent encounter for fracture with routine healing: Secondary | ICD-10-CM | POA: Diagnosis not present

## 2017-11-17 DIAGNOSIS — I10 Essential (primary) hypertension: Secondary | ICD-10-CM | POA: Diagnosis not present

## 2017-11-17 DIAGNOSIS — E1142 Type 2 diabetes mellitus with diabetic polyneuropathy: Secondary | ICD-10-CM | POA: Diagnosis not present

## 2017-11-17 DIAGNOSIS — F329 Major depressive disorder, single episode, unspecified: Secondary | ICD-10-CM | POA: Diagnosis not present

## 2017-11-17 DIAGNOSIS — J441 Chronic obstructive pulmonary disease with (acute) exacerbation: Secondary | ICD-10-CM | POA: Diagnosis not present

## 2017-11-17 DIAGNOSIS — G20 Parkinson's disease: Secondary | ICD-10-CM | POA: Diagnosis not present

## 2017-11-18 DIAGNOSIS — E1142 Type 2 diabetes mellitus with diabetic polyneuropathy: Secondary | ICD-10-CM | POA: Diagnosis not present

## 2017-11-18 DIAGNOSIS — F329 Major depressive disorder, single episode, unspecified: Secondary | ICD-10-CM | POA: Diagnosis not present

## 2017-11-18 DIAGNOSIS — S2241XD Multiple fractures of ribs, right side, subsequent encounter for fracture with routine healing: Secondary | ICD-10-CM | POA: Diagnosis not present

## 2017-11-18 DIAGNOSIS — J441 Chronic obstructive pulmonary disease with (acute) exacerbation: Secondary | ICD-10-CM | POA: Diagnosis not present

## 2017-11-18 DIAGNOSIS — G20 Parkinson's disease: Secondary | ICD-10-CM | POA: Diagnosis not present

## 2017-11-18 DIAGNOSIS — I10 Essential (primary) hypertension: Secondary | ICD-10-CM | POA: Diagnosis not present

## 2017-11-22 ENCOUNTER — Other Ambulatory Visit: Payer: Self-pay | Admitting: *Deleted

## 2017-11-22 NOTE — Patient Outreach (Signed)
[facility]) Care Management  11/22/2017  Kristen Mason 1934/07/05 [id number]   3rd unsuccessful outreach to member.  If no call back by 11/1 will close case due to inability to maintain contact.  [name], [place], MSN [place] [phone]

## 2017-12-03 ENCOUNTER — Other Ambulatory Visit: Payer: Self-pay | Admitting: *Deleted

## 2017-12-03 NOTE — Patient Outreach (Signed)
[facility]) Care Management  12/03/2017  Kristen Mason January 08, 1935 [id number]   No response from member after multiple outreach attempts and letter sent to home.  Will close case due to inability to maintain contact.  Will notify member and primary MD of case closure.  [name], [place], MSN [place] [phone]

## 2018-03-28 DIAGNOSIS — E782 Mixed hyperlipidemia: Secondary | ICD-10-CM | POA: Diagnosis not present

## 2018-03-28 DIAGNOSIS — G20 Parkinson's disease: Secondary | ICD-10-CM | POA: Diagnosis not present

## 2018-03-28 DIAGNOSIS — E1142 Type 2 diabetes mellitus with diabetic polyneuropathy: Secondary | ICD-10-CM | POA: Diagnosis not present

## 2018-03-28 DIAGNOSIS — G629 Polyneuropathy, unspecified: Secondary | ICD-10-CM | POA: Diagnosis not present

## 2018-03-28 DIAGNOSIS — I700 Atherosclerosis of aorta: Secondary | ICD-10-CM | POA: Diagnosis not present

## 2018-03-28 DIAGNOSIS — E119 Type 2 diabetes mellitus without complications: Secondary | ICD-10-CM | POA: Diagnosis not present

## 2018-03-28 DIAGNOSIS — F325 Major depressive disorder, single episode, in full remission: Secondary | ICD-10-CM | POA: Diagnosis not present

## 2018-03-28 DIAGNOSIS — F411 Generalized anxiety disorder: Secondary | ICD-10-CM | POA: Diagnosis not present

## 2018-03-28 DIAGNOSIS — I10 Essential (primary) hypertension: Secondary | ICD-10-CM | POA: Diagnosis not present

## 2018-03-28 DIAGNOSIS — R251 Tremor, unspecified: Secondary | ICD-10-CM | POA: Diagnosis not present

## 2018-03-28 DIAGNOSIS — E049 Nontoxic goiter, unspecified: Secondary | ICD-10-CM | POA: Diagnosis not present

## 2018-03-28 DIAGNOSIS — N183 Chronic kidney disease, stage 3 (moderate): Secondary | ICD-10-CM | POA: Diagnosis not present

## 2018-10-31 ENCOUNTER — Encounter (HOSPITAL_COMMUNITY): Payer: Self-pay

## 2018-10-31 ENCOUNTER — Inpatient Hospital Stay (HOSPITAL_COMMUNITY): Payer: Medicare Other

## 2018-10-31 ENCOUNTER — Emergency Department (HOSPITAL_COMMUNITY): Payer: Medicare Other

## 2018-10-31 ENCOUNTER — Inpatient Hospital Stay (HOSPITAL_COMMUNITY)
Admission: EM | Admit: 2018-10-31 | Discharge: 2018-11-10 | DRG: 481 | Disposition: A | Discharge status: Skilled Nursing Facility | Payer: Medicare Other | Attending: Internal Medicine | Admitting: Internal Medicine

## 2018-10-31 ENCOUNTER — Other Ambulatory Visit: Payer: Self-pay

## 2018-10-31 DIAGNOSIS — N2889 Other specified disorders of kidney and ureter: Secondary | ICD-10-CM | POA: Diagnosis not present

## 2018-10-31 DIAGNOSIS — W19XXXA Unspecified fall, initial encounter: Secondary | ICD-10-CM

## 2018-10-31 DIAGNOSIS — M6281 Muscle weakness (generalized): Secondary | ICD-10-CM | POA: Diagnosis present

## 2018-10-31 DIAGNOSIS — R109 Unspecified abdominal pain: Secondary | ICD-10-CM | POA: Diagnosis not present

## 2018-10-31 DIAGNOSIS — F419 Anxiety disorder, unspecified: Secondary | ICD-10-CM | POA: Diagnosis present

## 2018-10-31 DIAGNOSIS — S72401A Unspecified fracture of lower end of right femur, initial encounter for closed fracture: Secondary | ICD-10-CM

## 2018-10-31 DIAGNOSIS — N179 Acute kidney failure, unspecified: Secondary | ICD-10-CM | POA: Diagnosis present

## 2018-10-31 DIAGNOSIS — Z23 Encounter for immunization: Secondary | ICD-10-CM | POA: Diagnosis not present

## 2018-10-31 DIAGNOSIS — M1611 Unilateral primary osteoarthritis, right hip: Secondary | ICD-10-CM | POA: Diagnosis not present

## 2018-10-31 DIAGNOSIS — Z66 Do not resuscitate: Secondary | ICD-10-CM | POA: Diagnosis present

## 2018-10-31 DIAGNOSIS — Z03818 Encounter for observation for suspected exposure to other biological agents ruled out: Secondary | ICD-10-CM | POA: Diagnosis not present

## 2018-10-31 DIAGNOSIS — D539 Nutritional anemia, unspecified: Secondary | ICD-10-CM | POA: Diagnosis present

## 2018-10-31 DIAGNOSIS — K219 Gastro-esophageal reflux disease without esophagitis: Secondary | ICD-10-CM | POA: Diagnosis present

## 2018-10-31 DIAGNOSIS — Z743 Need for continuous supervision: Secondary | ICD-10-CM | POA: Diagnosis not present

## 2018-10-31 DIAGNOSIS — E119 Type 2 diabetes mellitus without complications: Secondary | ICD-10-CM | POA: Diagnosis not present

## 2018-10-31 DIAGNOSIS — I959 Hypotension, unspecified: Secondary | ICD-10-CM | POA: Diagnosis present

## 2018-10-31 DIAGNOSIS — I10 Essential (primary) hypertension: Secondary | ICD-10-CM | POA: Diagnosis present

## 2018-10-31 DIAGNOSIS — E860 Dehydration: Secondary | ICD-10-CM | POA: Diagnosis present

## 2018-10-31 DIAGNOSIS — E559 Vitamin D deficiency, unspecified: Secondary | ICD-10-CM | POA: Diagnosis present

## 2018-10-31 DIAGNOSIS — J8410 Pulmonary fibrosis, unspecified: Secondary | ICD-10-CM | POA: Diagnosis present

## 2018-10-31 DIAGNOSIS — Z20828 Contact with and (suspected) exposure to other viral communicable diseases: Secondary | ICD-10-CM | POA: Diagnosis present

## 2018-10-31 DIAGNOSIS — F329 Major depressive disorder, single episode, unspecified: Secondary | ICD-10-CM | POA: Diagnosis present

## 2018-10-31 DIAGNOSIS — E538 Deficiency of other specified B group vitamins: Secondary | ICD-10-CM | POA: Diagnosis present

## 2018-10-31 DIAGNOSIS — E785 Hyperlipidemia, unspecified: Secondary | ICD-10-CM | POA: Diagnosis present

## 2018-10-31 DIAGNOSIS — G8929 Other chronic pain: Secondary | ICD-10-CM | POA: Diagnosis present

## 2018-10-31 DIAGNOSIS — I272 Pulmonary hypertension, unspecified: Secondary | ICD-10-CM | POA: Diagnosis present

## 2018-10-31 DIAGNOSIS — S72491A Other fracture of lower end of right femur, initial encounter for closed fracture: Secondary | ICD-10-CM | POA: Diagnosis not present

## 2018-10-31 DIAGNOSIS — S72441D Displaced fracture of lower epiphysis (separation) of right femur, subsequent encounter for closed fracture with routine healing: Secondary | ICD-10-CM | POA: Diagnosis not present

## 2018-10-31 DIAGNOSIS — J441 Chronic obstructive pulmonary disease with (acute) exacerbation: Secondary | ICD-10-CM | POA: Diagnosis not present

## 2018-10-31 DIAGNOSIS — Z794 Long term (current) use of insulin: Secondary | ICD-10-CM

## 2018-10-31 DIAGNOSIS — Z741 Need for assistance with personal care: Secondary | ICD-10-CM | POA: Diagnosis present

## 2018-10-31 DIAGNOSIS — D62 Acute posthemorrhagic anemia: Secondary | ICD-10-CM | POA: Diagnosis not present

## 2018-10-31 DIAGNOSIS — E872 Acidosis: Secondary | ICD-10-CM | POA: Diagnosis present

## 2018-10-31 DIAGNOSIS — W010XXA Fall on same level from slipping, tripping and stumbling without subsequent striking against object, initial encounter: Secondary | ICD-10-CM | POA: Diagnosis present

## 2018-10-31 DIAGNOSIS — H5711 Ocular pain, right eye: Secondary | ICD-10-CM | POA: Diagnosis present

## 2018-10-31 DIAGNOSIS — E1122 Type 2 diabetes mellitus with diabetic chronic kidney disease: Secondary | ICD-10-CM | POA: Diagnosis present

## 2018-10-31 DIAGNOSIS — R2689 Other abnormalities of gait and mobility: Secondary | ICD-10-CM | POA: Diagnosis present

## 2018-10-31 DIAGNOSIS — W19XXXD Unspecified fall, subsequent encounter: Secondary | ICD-10-CM | POA: Diagnosis not present

## 2018-10-31 DIAGNOSIS — S79929A Unspecified injury of unspecified thigh, initial encounter: Secondary | ICD-10-CM | POA: Diagnosis not present

## 2018-10-31 DIAGNOSIS — M170 Bilateral primary osteoarthritis of knee: Secondary | ICD-10-CM | POA: Diagnosis present

## 2018-10-31 DIAGNOSIS — Z87891 Personal history of nicotine dependence: Secondary | ICD-10-CM

## 2018-10-31 DIAGNOSIS — E1142 Type 2 diabetes mellitus with diabetic polyneuropathy: Secondary | ICD-10-CM | POA: Diagnosis present

## 2018-10-31 DIAGNOSIS — S79911A Unspecified injury of right hip, initial encounter: Secondary | ICD-10-CM | POA: Diagnosis not present

## 2018-10-31 DIAGNOSIS — Z85528 Personal history of other malignant neoplasm of kidney: Secondary | ICD-10-CM

## 2018-10-31 DIAGNOSIS — I361 Nonrheumatic tricuspid (valve) insufficiency: Secondary | ICD-10-CM | POA: Diagnosis not present

## 2018-10-31 DIAGNOSIS — H1132 Conjunctival hemorrhage, left eye: Secondary | ICD-10-CM | POA: Diagnosis present

## 2018-10-31 DIAGNOSIS — S72451A Displaced supracondylar fracture without intracondylar extension of lower end of right femur, initial encounter for closed fracture: Secondary | ICD-10-CM | POA: Diagnosis not present

## 2018-10-31 DIAGNOSIS — N189 Chronic kidney disease, unspecified: Secondary | ICD-10-CM | POA: Diagnosis present

## 2018-10-31 DIAGNOSIS — N183 Chronic kidney disease, stage 3 unspecified: Secondary | ICD-10-CM | POA: Diagnosis present

## 2018-10-31 DIAGNOSIS — R0902 Hypoxemia: Secondary | ICD-10-CM | POA: Diagnosis not present

## 2018-10-31 DIAGNOSIS — Z7982 Long term (current) use of aspirin: Secondary | ICD-10-CM

## 2018-10-31 DIAGNOSIS — M25561 Pain in right knee: Secondary | ICD-10-CM | POA: Diagnosis not present

## 2018-10-31 DIAGNOSIS — I480 Paroxysmal atrial fibrillation: Secondary | ICD-10-CM | POA: Diagnosis present

## 2018-10-31 DIAGNOSIS — R279 Unspecified lack of coordination: Secondary | ICD-10-CM | POA: Diagnosis not present

## 2018-10-31 DIAGNOSIS — S7291XA Unspecified fracture of right femur, initial encounter for closed fracture: Secondary | ICD-10-CM | POA: Diagnosis present

## 2018-10-31 DIAGNOSIS — Z79891 Long term (current) use of opiate analgesic: Secondary | ICD-10-CM

## 2018-10-31 DIAGNOSIS — D519 Vitamin B12 deficiency anemia, unspecified: Secondary | ICD-10-CM | POA: Diagnosis present

## 2018-10-31 DIAGNOSIS — G250 Essential tremor: Secondary | ICD-10-CM | POA: Diagnosis present

## 2018-10-31 DIAGNOSIS — M1711 Unilateral primary osteoarthritis, right knee: Secondary | ICD-10-CM | POA: Diagnosis present

## 2018-10-31 DIAGNOSIS — R262 Difficulty in walking, not elsewhere classified: Secondary | ICD-10-CM | POA: Diagnosis present

## 2018-10-31 DIAGNOSIS — M810 Age-related osteoporosis without current pathological fracture: Secondary | ICD-10-CM | POA: Diagnosis present

## 2018-10-31 DIAGNOSIS — E1140 Type 2 diabetes mellitus with diabetic neuropathy, unspecified: Secondary | ICD-10-CM | POA: Diagnosis present

## 2018-10-31 DIAGNOSIS — I129 Hypertensive chronic kidney disease with stage 1 through stage 4 chronic kidney disease, or unspecified chronic kidney disease: Secondary | ICD-10-CM | POA: Diagnosis present

## 2018-10-31 DIAGNOSIS — S0990XA Unspecified injury of head, initial encounter: Secondary | ICD-10-CM | POA: Diagnosis not present

## 2018-10-31 DIAGNOSIS — M25571 Pain in right ankle and joints of right foot: Secondary | ICD-10-CM | POA: Diagnosis not present

## 2018-10-31 DIAGNOSIS — I700 Atherosclerosis of aorta: Secondary | ICD-10-CM | POA: Diagnosis not present

## 2018-10-31 DIAGNOSIS — R1110 Vomiting, unspecified: Secondary | ICD-10-CM | POA: Diagnosis not present

## 2018-10-31 DIAGNOSIS — T148XXA Other injury of unspecified body region, initial encounter: Secondary | ICD-10-CM

## 2018-10-31 DIAGNOSIS — N1832 Chronic kidney disease, stage 3b: Secondary | ICD-10-CM | POA: Diagnosis not present

## 2018-10-31 DIAGNOSIS — G20 Parkinson's disease: Secondary | ICD-10-CM | POA: Diagnosis present

## 2018-10-31 DIAGNOSIS — S199XXA Unspecified injury of neck, initial encounter: Secondary | ICD-10-CM | POA: Diagnosis not present

## 2018-10-31 DIAGNOSIS — Z90710 Acquired absence of both cervix and uterus: Secondary | ICD-10-CM

## 2018-10-31 DIAGNOSIS — S72491D Other fracture of lower end of right femur, subsequent encounter for closed fracture with routine healing: Secondary | ICD-10-CM | POA: Diagnosis not present

## 2018-10-31 DIAGNOSIS — M47816 Spondylosis without myelopathy or radiculopathy, lumbar region: Secondary | ICD-10-CM | POA: Diagnosis present

## 2018-10-31 DIAGNOSIS — M160 Bilateral primary osteoarthritis of hip: Secondary | ICD-10-CM | POA: Diagnosis present

## 2018-10-31 DIAGNOSIS — Z79899 Other long term (current) drug therapy: Secondary | ICD-10-CM

## 2018-10-31 DIAGNOSIS — R52 Pain, unspecified: Secondary | ICD-10-CM | POA: Diagnosis not present

## 2018-10-31 LAB — LIPID PANEL
Cholesterol: 156 mg/dL (ref 0–200)
HDL: 38 mg/dL — ABNORMAL LOW (ref >40)
LDL Cholesterol: 82 mg/dL (ref 0–99)
Total CHOL/HDL Ratio: 4.1 RATIO
Triglycerides: 180 mg/dL — ABNORMAL HIGH (ref <150)
VLDL: 36 mg/dL (ref 0–40)

## 2018-10-31 LAB — CBG MONITORING, ED: Glucose-Capillary: 150 mg/dL — ABNORMAL HIGH (ref 70–99)

## 2018-10-31 LAB — SURGICAL PCR SCREEN
MRSA, PCR: POSITIVE — AB
Staphylococcus aureus: POSITIVE — AB

## 2018-10-31 LAB — CBC WITH DIFFERENTIAL/PLATELET
Abs Immature Granulocytes: 0.04 10*3/uL (ref 0.00–0.07)
Basophils Absolute: 0 10*3/uL (ref 0.0–0.1)
Basophils Relative: 0 %
Eosinophils Absolute: 0 10*3/uL (ref 0.0–0.5)
Eosinophils Relative: 0 %
HCT: 29.3 % — ABNORMAL LOW (ref 36.0–46.0)
Hemoglobin: 9 g/dL — ABNORMAL LOW (ref 12.0–15.0)
Immature Granulocytes: 1 %
Lymphocytes Relative: 10 %
Lymphs Abs: 0.8 10*3/uL (ref 0.7–4.0)
MCH: 31.6 pg (ref 26.0–34.0)
MCHC: 30.7 g/dL (ref 30.0–36.0)
MCV: 102.8 fL — ABNORMAL HIGH (ref 80.0–100.0)
Monocytes Absolute: 0.9 10*3/uL (ref 0.1–1.0)
Monocytes Relative: 11 %
Neutro Abs: 6.2 10*3/uL (ref 1.7–7.7)
Neutrophils Relative %: 78 %
Platelets: 161 10*3/uL (ref 150–400)
RBC: 2.85 MIL/uL — ABNORMAL LOW (ref 3.87–5.11)
RDW: 15.9 % — ABNORMAL HIGH (ref 11.5–15.5)
WBC: 8 10*3/uL (ref 4.0–10.5)
nRBC: 0 % (ref 0.0–0.2)

## 2018-10-31 LAB — BASIC METABOLIC PANEL
Anion gap: 11 (ref 5–15)
BUN: 22 mg/dL (ref 8–23)
CO2: 24 mmol/L (ref 22–32)
Calcium: 8.1 mg/dL — ABNORMAL LOW (ref 8.9–10.3)
Chloride: 104 mmol/L (ref 98–111)
Creatinine, Ser: 1.36 mg/dL — ABNORMAL HIGH (ref 0.44–1.00)
GFR calc Af Amer: 42 mL/min — ABNORMAL LOW (ref >60)
GFR calc non Af Amer: 36 mL/min — ABNORMAL LOW (ref >60)
Glucose, Bld: 155 mg/dL — ABNORMAL HIGH (ref 70–99)
Potassium: 4.7 mmol/L (ref 3.5–5.1)
Sodium: 139 mmol/L (ref 135–145)

## 2018-10-31 LAB — IRON AND TIBC
Iron: 35 ug/dL (ref 28–170)
Saturation Ratios: 13 % (ref 10.4–31.8)
TIBC: 260 ug/dL (ref 250–450)
UIBC: 225 ug/dL

## 2018-10-31 LAB — FERRITIN: Ferritin: 39 ng/mL (ref 11–307)

## 2018-10-31 LAB — MAGNESIUM: Magnesium: 1.9 mg/dL (ref 1.7–2.4)

## 2018-10-31 LAB — TSH: TSH: 0.237 u[IU]/mL — ABNORMAL LOW (ref 0.350–4.500)

## 2018-10-31 LAB — HEMOGLOBIN A1C
Hgb A1c MFr Bld: 6.2 % — ABNORMAL HIGH (ref 4.8–5.6)
Mean Plasma Glucose: 131.24 mg/dL

## 2018-10-31 LAB — PHOSPHORUS: Phosphorus: 3.7 mg/dL (ref 2.5–4.6)

## 2018-10-31 LAB — GLUCOSE, CAPILLARY
Glucose-Capillary: 118 mg/dL — ABNORMAL HIGH (ref 70–99)
Glucose-Capillary: 150 mg/dL — ABNORMAL HIGH (ref 70–99)

## 2018-10-31 LAB — SARS CORONAVIRUS 2 (TAT 6-24 HRS): SARS Coronavirus 2: NEGATIVE

## 2018-10-31 LAB — ABO/RH: ABO/RH(D): O NEG

## 2018-10-31 LAB — VITAMIN B12: Vitamin B-12: 151 pg/mL — ABNORMAL LOW (ref 180–914)

## 2018-10-31 MED ORDER — SODIUM CHLORIDE 0.9 % IV BOLUS
1000.0000 mL | Freq: Once | INTRAVENOUS | Status: AC
  Administered 2018-10-31: 1000 mL via INTRAVENOUS

## 2018-10-31 MED ORDER — PRIMIDONE 250 MG PO TABS
250.0000 mg | ORAL_TABLET | Freq: Two times a day (BID) | ORAL | Status: DC
  Administered 2018-10-31 – 2018-11-10 (×19): 250 mg via ORAL
  Filled 2018-10-31 (×22): qty 1

## 2018-10-31 MED ORDER — SODIUM CHLORIDE 0.9 % IV SOLN
INTRAVENOUS | Status: DC
  Administered 2018-10-31: 16:00:00 via INTRAVENOUS

## 2018-10-31 MED ORDER — CARBIDOPA-LEVODOPA 25-100 MG PO TABS
1.0000 | ORAL_TABLET | Freq: Three times a day (TID) | ORAL | Status: DC
  Administered 2018-10-31 – 2018-11-10 (×28): 1 via ORAL
  Filled 2018-10-31 (×30): qty 1

## 2018-10-31 MED ORDER — SIMVASTATIN 20 MG PO TABS
10.0000 mg | ORAL_TABLET | Freq: Every day | ORAL | Status: DC
  Administered 2018-10-31 – 2018-11-09 (×9): 10 mg via ORAL
  Filled 2018-10-31 (×10): qty 1

## 2018-10-31 MED ORDER — AMLODIPINE BESYLATE 5 MG PO TABS: 5.0000 mg | ORAL_TABLET | Freq: Every day | ORAL | Status: DC

## 2018-10-31 MED ORDER — CLONAZEPAM 1 MG PO TABS
1.0000 mg | ORAL_TABLET | Freq: Every day | ORAL | Status: DC
  Administered 2018-10-31 – 2018-11-09 (×10): 1 mg via ORAL
  Filled 2018-10-31 (×10): qty 1

## 2018-10-31 MED ORDER — ONDANSETRON HCL 4 MG/2ML IJ SOLN
4.0000 mg | Freq: Four times a day (QID) | INTRAMUSCULAR | Status: DC | PRN
  Administered 2018-11-01 – 2018-11-03 (×3): 4 mg via INTRAVENOUS
  Filled 2018-10-31 (×5): qty 2

## 2018-10-31 MED ORDER — FENTANYL CITRATE (PF) 100 MCG/2ML IJ SOLN
50.0000 ug | Freq: Once | INTRAMUSCULAR | Status: AC
  Administered 2018-10-31: 13:00:00 50 ug via INTRAVENOUS
  Filled 2018-10-31: qty 2

## 2018-10-31 MED ORDER — INSULIN ASPART 100 UNIT/ML ~~LOC~~ SOLN: 0.0000 [IU] | Freq: Every day | SUBCUTANEOUS | Status: DC

## 2018-10-31 MED ORDER — INSULIN ASPART 100 UNIT/ML ~~LOC~~ SOLN
0.0000 [IU] | Freq: Three times a day (TID) | SUBCUTANEOUS | Status: DC
  Administered 2018-11-02: 17:00:00 3 [IU] via SUBCUTANEOUS
  Administered 2018-11-02 – 2018-11-07 (×12): 2 [IU] via SUBCUTANEOUS
  Administered 2018-11-07: 14:00:00 3 [IU] via SUBCUTANEOUS
  Administered 2018-11-08 (×2): 2 [IU] via SUBCUTANEOUS
  Administered 2018-11-09: 3 [IU] via SUBCUTANEOUS
  Administered 2018-11-09: 1 [IU] via SUBCUTANEOUS
  Administered 2018-11-10: 2 [IU] via SUBCUTANEOUS
  Administered 2018-11-10: 3 [IU] via SUBCUTANEOUS

## 2018-10-31 MED ORDER — ACETAMINOPHEN 650 MG RE SUPP: 650.0000 mg | Freq: Four times a day (QID) | RECTAL | Status: DC | PRN

## 2018-10-31 MED ORDER — DIPHENHYDRAMINE HCL 12.5 MG/5ML PO ELIX
12.5000 mg | ORAL_SOLUTION | Freq: Once | ORAL | Status: AC
  Administered 2018-10-31: 12.5 mg via ORAL
  Filled 2018-10-31: qty 10

## 2018-10-31 MED ORDER — ASPIRIN EC 81 MG PO TBEC
81.0000 mg | DELAYED_RELEASE_TABLET | Freq: Every day | ORAL | Status: DC
  Administered 2018-10-31 – 2018-11-10 (×10): 81 mg via ORAL
  Filled 2018-10-31 (×11): qty 1

## 2018-10-31 MED ORDER — MUPIROCIN 2 % EX OINT
TOPICAL_OINTMENT | Freq: Two times a day (BID) | CUTANEOUS | Status: DC
  Administered 2018-10-31 – 2018-11-01 (×2): via NASAL
  Administered 2018-11-01: 1 via NASAL
  Administered 2018-11-02 – 2018-11-07 (×11): via NASAL
  Filled 2018-10-31 (×5): qty 22

## 2018-10-31 MED ORDER — ONDANSETRON HCL 4 MG/2ML IJ SOLN
4.0000 mg | Freq: Once | INTRAMUSCULAR | Status: AC
  Administered 2018-10-31: 4 mg via INTRAVENOUS
  Filled 2018-10-31: qty 2

## 2018-10-31 MED ORDER — ONDANSETRON HCL 4 MG PO TABS
4.0000 mg | ORAL_TABLET | Freq: Four times a day (QID) | ORAL | Status: DC | PRN
  Administered 2018-11-02: 4 mg via ORAL
  Filled 2018-10-31 (×2): qty 1

## 2018-10-31 MED ORDER — PANTOPRAZOLE SODIUM 40 MG PO TBEC
40.0000 mg | DELAYED_RELEASE_TABLET | Freq: Every day | ORAL | Status: DC
  Administered 2018-10-31 – 2018-11-03 (×3): 40 mg via ORAL
  Filled 2018-10-31 (×3): qty 1

## 2018-10-31 MED ORDER — MORPHINE SULFATE (PF) 2 MG/ML IV SOLN
2.0000 mg | INTRAVENOUS | Status: DC | PRN
  Administered 2018-10-31 – 2018-11-07 (×6): 2 mg via INTRAVENOUS
  Filled 2018-10-31 (×6): qty 1

## 2018-10-31 MED ORDER — CITALOPRAM HYDROBROMIDE 20 MG PO TABS
40.0000 mg | ORAL_TABLET | Freq: Every day | ORAL | Status: DC
  Administered 2018-10-31: 16:00:00 40 mg via ORAL
  Filled 2018-10-31: qty 2

## 2018-10-31 MED ORDER — ACETAMINOPHEN 325 MG PO TABS
650.0000 mg | ORAL_TABLET | Freq: Four times a day (QID) | ORAL | Status: DC | PRN
  Administered 2018-10-31 – 2018-11-01 (×2): 650 mg via ORAL
  Filled 2018-10-31 (×3): qty 2

## 2018-10-31 NOTE — Progress Notes (Signed)
[facility]  Contacted by Dr. Alvan Dame regarding pt  R distal femur fracture  Unstable fracture  Will need surgical intervention to address Plan for OR tomorrow afternoon Will see pt tomorrow am  CT R knee to eval for intra-articular component, hopeful that fracture is amenable to retrograde IMN   NPO after MN NWB R leg Ice and elevate extremity  Cbc in am   Jari Pigg, PA-C [phone] (C) 10/31/2018, 3:38 PM  [facility] [place] [address] [phone] Domingo Sep (F)

## 2018-10-31 NOTE — H&P (Signed)
History and Physical    Kristen Mason [id number] DOB: Feb 16, 1934 DOA: 10/31/2018  PCP: Wenda Low, MD  Patient coming from: Home  I have personally briefly reviewed patient's old medical records in [place]  Chief Complaint: s/p Fall  HPI: Kristen Mason is a 84 y.o. female with medical history significant of hypertension, diabetes, GERD, chronic kidney disease presents to emergency department with right leg pain following fall yesterday.  Patient reports that she fell yesterday however does not wanted to come to the emergency department due to [place].  This morning her pain started getting worse.  Daughter brought her to the emergency department for further evaluation and management.  Patient denies loss of consciousness, seizures, head trauma, lightheadedness, dizziness, headache, blurry vision prior or after the fall.  She is not on anticoagulation.  She lives with her daughter and uses walker for ambulation.  Denies nausea, vomiting, epigastric pain, chest pain, shortness of breath, numbness weakness tingling sensation.  She has chronic diabetic neuropathy in bilateral lower extremities and takes gabapentin.  She is a former smoker, denies alcohol, illicit drug use.  ED Course: Upon arrival: Patient was hypotensive.  IV fluids was given.  Right knee x-ray revealed right distal femur fracture with 0.5 cm dorsal displacement and severe tricompartmental degenerative changes.  X-ray of right ankle: Shows right calcaneal avulsion fracture.  CT head and cervical spine came back negative for acute findings.  EDP consulted orthopedic surgery for further management.  [facility] hospitalist consulted for admission for the concern of drop in hemoglobin and hypotension.  Review of Systems: As per HPI otherwise negative.    Past Medical History:  Diagnosis Date   Diabetes [facility])    Essential tremor    Fall 06/07/2017   rib fractures   GERD (gastroesophageal reflux disease)     Hypertension    Peripheral neuropathy     Past Surgical History:  Procedure Laterality Date   ABDOMINAL HYSTERECTOMY  1975   BREAST LUMPECTOMY Right    non cancerous     reports that she has quit smoking. Her smoking use included cigarettes. She has quit using smokeless tobacco. She reports that she does not drink alcohol or use drugs.  Allergies  Allergen Reactions   Statins Other (See Comments)    No family history on file.  Prior to Admission medications   Medication Sig Start Date End Date Taking? Authorizing Provider  amLODipine (NORVASC) 5 MG tablet Take 5 mg by mouth daily.   Yes [provider]  aspirin 81 MG tablet Take 81 mg by mouth daily.   Yes [provider]  carbidopa-levodopa (SINEMET IR) 25-100 MG tablet Take 1 tablet by mouth 3 (three) times daily.   Yes [provider]  cetirizine (ZYRTEC) 10 MG tablet Take 10 mg by mouth daily.   Yes [provider]  citalopram (CELEXA) 40 MG tablet Take 40 mg by mouth daily.   Yes [provider]  clonazePAM (KLONOPIN) 1 MG tablet Take 1 tablet (1 mg total) by mouth at bedtime. 06/11/17  Yes Ghimire, Henreitta Leber, MD  dicyclomine (BENTYL) 10 MG capsule Take 10 mg by mouth 4 (four) times daily - after meals and at bedtime.   Yes [provider]  gabapentin (NEURONTIN) 800 MG tablet Take 800 mg by mouth 3 (three) times daily.   Yes [provider]  glimepiride (AMARYL) 1 MG tablet Take 1 mg by mouth daily. 06/04/18  Yes [provider]  hydrOXYzine (ATARAX/VISTARIL) 25 MG tablet  Take 25 mg by mouth at bedtime.   Yes [provider]  lisinopril (PRINIVIL,ZESTRIL) 2.5 MG tablet Take 2.5 mg by mouth 2 (two) times daily. take 1 tab po daily for htn    Yes [provider]  Omega-3 Fatty Acids (FISH OIL) 1000 MG CPDR Take 1,000 mg by mouth daily.   Yes [provider]  pantoprazole (PROTONIX) 40 MG tablet Take 40 mg by mouth daily.   Yes  [provider]  primidone (MYSOLINE) 250 MG tablet Take 250 mg by mouth 2 (two) times daily.   Yes [provider]  simvastatin (ZOCOR) 10 MG tablet Take 10 mg by mouth daily. 05/28/18  Yes [provider]  traMADol (ULTRAM) 50 MG tablet Take 1 tablet (50 mg total) by mouth every 6 (six) hours as needed. Patient taking differently: Take 50 mg by mouth every 6 (six) hours as needed for moderate pain.  06/11/17  Yes Jonetta Osgood, MD    Physical Exam: Vitals:   10/31/18 1126 10/31/18 1200 10/31/18 1240 10/31/18 1245  BP: (!) 102/51 113/78 115/68 (!) 111/59  Pulse: 70 77  70  Resp: 17 16  (!) 22  Temp: 98.2 F (36.8 C)     TempSrc: Oral     SpO2: 96% 93%  97%  Weight:      Height:        Constitutional: NAD, calm, comfortable Vitals:   10/31/18 1126 10/31/18 1200 10/31/18 1240 10/31/18 1245  BP: (!) 102/51 113/78 115/68 (!) 111/59  Pulse: 70 77  70  Resp: 17 16  (!) 22  Temp: 98.2 F (36.8 C)     TempSrc: Oral     SpO2: 96% 93%  97%  Weight:      Height:       Constitutional: Alert and oriented x3, not in acute distress, communicating well.   Eyes: PERRL, lids and conjunctivae normal ENMT: Mucous membranes are moist. Posterior pharynx clear of any exudate or lesions.Normal dentition.  Neck: normal, supple, no masses, no thyromegaly Respiratory: clear to auscultation bilaterally, no wheezing, no crackles. Normal respiratory effort. No accessory muscle use.  Cardiovascular: Regular rate and rhythm, no murmurs / rubs / gallops. No extremity edema. 2+ pedal pulses. No carotid bruits.  Abdomen: no tenderness, no masses palpated. No hepatosplenomegaly. Bowel sounds positive.  Musculoskeletal: Right leg: Tenderness positive on right mid and distal femur, sensation intact, no swelling, erythema noted. Skin: no rashes, lesions, ulcers. No induration onychomycosis noted in both feet. Neurologic: CN 2-12 grossly intact. Sensation intact, DTR normal. Strength  5/5 in all 4.  Psychiatric: Normal judgment and insight. Alert and oriented x 3. Normal mood.    Labs on Admission: I have personally reviewed following labs and imaging studies  CBC: Recent Labs  Lab 10/31/18 1115  WBC 8.0  NEUTROABS 6.2  HGB 9.0*  HCT 29.3*  MCV 102.8*  PLT [id number]   Basic Metabolic Panel: Recent Labs  Lab 10/31/18 1115  NA 139  K 4.7  CL 104  CO2 24  GLUCOSE 155*  BUN 22  CREATININE 1.36*  CALCIUM 8.1*   GFR: Estimated Creatinine Clearance: 31.9 mL/min (A) (by C-G formula based on SCr of 1.36 mg/dL (H)). Liver Function Tests: No results for input(s): AST, ALT, ALKPHOS, BILITOT, PROT, ALBUMIN in the last 168 hours. No results for input(s): LIPASE, AMYLASE in the last 168 hours. No results for input(s): AMMONIA in the last 168 hours. Coagulation Profile: No results for input(s): INR, PROTIME  in the last 168 hours. Cardiac Enzymes: No results for input(s): CKTOTAL, CKMB, CKMBINDEX, TROPONINI in the last 168 hours. BNP (last 3 results) No results for input(s): PROBNP in the last 8760 hours. HbA1C: No results for input(s): HGBA1C in the last 72 hours. CBG: Recent Labs  Lab 10/31/18 1115  GLUCAP 150*   Lipid Profile: No results for input(s): CHOL, HDL, LDLCALC, TRIG, CHOLHDL, LDLDIRECT in the last 72 hours. Thyroid Function Tests: No results for input(s): TSH, T4TOTAL, FREET4, T3FREE, THYROIDAB in the last 72 hours. Anemia Panel: No results for input(s): VITAMINB12, FOLATE, FERRITIN, TIBC, IRON, RETICCTPCT in the last 72 hours. Urine analysis:    Component Value Date/Time   COLORURINE AMBER (A) 06/07/2017 1455   APPEARANCEUR CLEAR 06/07/2017 1455   LABSPEC 1.013 06/07/2017 1455   PHURINE 6.0 06/07/2017 1455   GLUCOSEU NEGATIVE 06/07/2017 1455   HGBUR NEGATIVE 06/07/2017 [place] 06/07/2017 1455   KETONESUR NEGATIVE 06/07/2017 1455   PROTEINUR 30 (A) 06/07/2017 1455   NITRITE POSITIVE (A) 06/07/2017 1455   LEUKOCYTESUR  NEGATIVE 06/07/2017 1455    Radiological Exams on Admission: Dg Ankle Complete Right  Result Date: 10/31/2018 CLINICAL DATA:  Acute RIGHT ankle pain following fall. Initial encounter. EXAM: RIGHT ANKLE - COMPLETE 3+ VIEW COMPARISON:  None. FINDINGS: A tiny fracture along the LATERAL calcaneus is noted, age indeterminate. No other acute fracture, subluxation or dislocation identified. Mild LATERAL soft tissue swelling is present. IMPRESSION: Age indeterminate tiny fracture/avulsion off the LATERAL calcaneus. No other acute bony abnormality. Electronically Signed   By: Margarette Canada M.D.   On: 10/31/2018 12:57   Ct Head Wo Contrast  Result Date: 10/31/2018 CLINICAL DATA:  84 year old female with fall and head neck injury. Initial encounter. EXAM: CT HEAD WITHOUT CONTRAST CT CERVICAL SPINE WITHOUT CONTRAST TECHNIQUE: Multidetector CT imaging of the head and cervical spine was performed following the standard protocol without intravenous contrast. Multiplanar CT image reconstructions of the cervical spine were also generated. COMPARISON:  06/07/2017 head CT FINDINGS: CT HEAD FINDINGS Brain: No evidence of acute infarction, hemorrhage, hydrocephalus, extra-axial collection or mass lesion/mass effect. Atrophy and chronic small-vessel white matter ischemic changes again noted. Vascular: Carotid atherosclerotic calcifications noted. Skull: Normal. Negative for fracture or focal lesion. Sinuses/Orbits: No acute finding. Other: None. CT CERVICAL SPINE FINDINGS Alignment: Normal. Skull base and vertebrae: No acute fracture. No primary bone lesion or focal pathologic process. Soft tissues and spinal canal: No prevertebral fluid or swelling. No visible canal hematoma. Disc levels: Multilevel degenerative disc disease/spondylosis noted, moderate from C5-C7. Moderate multilevel facet arthropathy identified. These findings contribute to mild to moderate central spinal and bony foraminal narrowing at several levels. Upper  chest: No acute abnormality. Markedly enlarged thyroid gland noted, RIGHT greater than LEFT. Other: None IMPRESSION: 1. No evidence of acute intracranial abnormality. Atrophy and chronic small-vessel white matter ischemic changes. 2. No static evidence of acute injury to the cervical spine. Degenerative changes as described. 3. Thyroid goiter. Electronically Signed   By: Margarette Canada M.D.   On: 10/31/2018 13:49   Ct Cervical Spine Wo Contrast  Result Date: 10/31/2018 CLINICAL DATA:  84 year old female with fall and head neck injury. Initial encounter. EXAM: CT HEAD WITHOUT CONTRAST CT CERVICAL SPINE WITHOUT CONTRAST TECHNIQUE: Multidetector CT imaging of the head and cervical spine was performed following the standard protocol without intravenous contrast. Multiplanar CT image reconstructions of the cervical spine were also generated. COMPARISON:  06/07/2017 head CT FINDINGS: CT HEAD FINDINGS Brain: No evidence of  acute infarction, hemorrhage, hydrocephalus, extra-axial collection or mass lesion/mass effect. Atrophy and chronic small-vessel white matter ischemic changes again noted. Vascular: Carotid atherosclerotic calcifications noted. Skull: Normal. Negative for fracture or focal lesion. Sinuses/Orbits: No acute finding. Other: None. CT CERVICAL SPINE FINDINGS Alignment: Normal. Skull base and vertebrae: No acute fracture. No primary bone lesion or focal pathologic process. Soft tissues and spinal canal: No prevertebral fluid or swelling. No visible canal hematoma. Disc levels: Multilevel degenerative disc disease/spondylosis noted, moderate from C5-C7. Moderate multilevel facet arthropathy identified. These findings contribute to mild to moderate central spinal and bony foraminal narrowing at several levels. Upper chest: No acute abnormality. Markedly enlarged thyroid gland noted, RIGHT greater than LEFT. Other: None IMPRESSION: 1. No evidence of acute intracranial abnormality. Atrophy and chronic  small-vessel white matter ischemic changes. 2. No static evidence of acute injury to the cervical spine. Degenerative changes as described. 3. Thyroid goiter. Electronically Signed   By: Margarette Canada M.D.   On: 10/31/2018 13:49   Dg Knee Complete 4 Views Right  Result Date: 10/31/2018 CLINICAL DATA:  Acute RIGHT knee pain following fall. Initial encounter. EXAM: RIGHT KNEE - COMPLETE 4+ VIEW COMPARISON:  None. FINDINGS: A mixed transverse/oblique fracture of the distal RIGHT femoral metadiaphysis is noted with 0.5 cm dorsal displacement. There is no definite intra-articular fracture extension identified. Severe tricompartmental degenerative changes are noted. There may be a tiny knee effusion present. No suspicious focal bony lesions are present. IMPRESSION: Distal RIGHT femoral fracture with 0.5 cm dorsal displacement. Severe tricompartmental degenerative changes. Electronically Signed   By: Margarette Canada M.D.   On: 10/31/2018 12:55   Dg Hip Unilat W Or Wo Pelvis 2-3 Views Right  Result Date: 10/31/2018 CLINICAL DATA:  Status post fall EXAM: DG HIP (WITH OR WITHOUT PELVIS) 2-3V RIGHT COMPARISON:  None. FINDINGS: Bones: No fracture or dislocation. Normal bone mineralization. No periostitis. Joints: Normal alignment. No erosive changes. Severe osteoarthritis of bilateral hips with subchondral cystic changes, subchondral sclerosis and marginal osteophytes. Lower lumbar spine spondylosis. Soft tissue: No soft tissue abnormality. No radiopaque foreign body. No chondrocalcinosis. IMPRESSION: Severe osteoarthritis of bilateral hips. Electronically Signed   By: Kathreen Devoid   On: 10/31/2018 12:55    EKG: NSR, no ST-T wave changes noted.  Assessment/Plan Principal Problem:   Femur fracture, right (HCC) Active Problems:   Controlled diabetes mellitus ([place])   Essential hypertension   Essential tremor   Acute on chronic kidney failure (HCC)   Macrocytic anemia    Right femur fracture: -Status post fall,  reviewed x-ray of right knee -We will admit patient.  -Monitor her vitals and H&H.  Received fentanyl for pain control in ED.  Start on morphine PRN for severe pain.  Continue IV fluids. -EDP consulted orthopedic surgery-recommended surgery tomorrow afternoon. -CT right knee without contrast is pending. -We will keep her n.p.o. after midnight.  Fracture/avulsion of lateral calcaneus: -No intervention at this time  Hypertension: Blood pressure is on lower side -Continue IV fluids and monitor blood pressure closely. -We will hold amlodipine for now.  Diabetes mellitus: We will check A1c -Start patient on sliding scale insulin.  Macrocytic anemia: -H&H dropped from 11.9-9.0 in 1 year. -MCV is elevated.  Patient denies melena or over-the-counter use of NSAIDs -Check folate, B12 level. -Monitor H&H closely and transfuse if indicated.  Acute on chronic kidney failure: -Likely secondary to dehydration. -Continue IV fluids and monitor kidney function closely. -Avoid nephrotoxic medication.  Essential tremors: -Patient reports that she takes CBD  at home for tremors and pain -Continue primidone  GERD: Stable continue Protonix  Hyperlipidemia: Check lipid panel.  Continue statin  Anxiety/depression: -Continue home meds of Celexa and Klonopin.   DVT prophylaxis: TED/SCD, No Lovenox  Code Status: Full Code Family Communication: Daughter present at bedside.  Plan of care discussed with patient & her daughter in length and they verbalized understanding and agreed with it. Disposition Plan: TBD Consults called: Orthopedic surgery Admission status: Inpatient.  Mckinley Jewel MD [facility] Pager [phone]  If 7PM-7AM, please contact night-coverage [email or web address] Password [email or web address]  10/31/2018, 2:22 PM

## 2018-10-31 NOTE — ED Notes (Signed)
ED TO INPATIENT HANDOFF REPORT  ED Nurse Name and Phone #: [phone]  S Name/Age/Gender Kristen Mason 84 y.o. female Room/Bed: 032C/032C  Code Status   Code Status: Full Code  Home/SNF/Other Home Patient oriented to: self, place, time and situation Is this baseline? Yes   Triage Complete: Triage complete  Chief Complaint fall leg pain  Triage Note Pt from home via ems; pt had unwitnessed mechanical fall yesterday afternoon at 1500 while trying to wipe up water on ground; no loc, not on thinners, did not hit head; evaluated by EMS yesterday but did not want to come to hospital; c/o tenderness to proximal R leg, instability R patella; pain increased throughout night; normally mobile with walker, endorses increase in falls; neuropathy in legs which pt says contributes to falls; 0000000 systolic initially, 50 fentanyl given PTA, BP dropped to 123XX123 systolic; 123XX123 mcg total given PTA  108/80 P 84 RR 16 92% RA after fentanyl administration, placed on 2L,100% CBG 167 T 98.68F   Allergies Allergies  Allergen Reactions  . Statins Other (See Comments)    Level of Care/Admitting Diagnosis ED Disposition    ED Disposition Condition [facility] Area: [place] [[id number]]  Level of Care: Med-Surg [16]  Covid Evaluation: Asymptomatic Screening Protocol (No Symptoms)  Diagnosis: Femur fracture, right [facility]) [id number]  Admitting Physician: Mckinley Jewel [id number]  Attending Physician: Mckinley Jewel [phone]  Estimated length of stay: past midnight tomorrow  Certification:: I certify this patient will need inpatient services for at least 2 midnights  PT Class (Do Not Modify): Inpatient [101]  PT Acc Code (Do Not Modify): Private [1]       B Medical/Surgery History Past Medical History:  Diagnosis Date  . Diabetes ([place])   . Essential tremor   . Fall 06/07/2017   rib fractures  . GERD (gastroesophageal reflux disease)   . Hypertension   .  Peripheral neuropathy    Past Surgical History:  Procedure Laterality Date  . ABDOMINAL HYSTERECTOMY  1975  . BREAST LUMPECTOMY Right    non cancerous     A IV Location/Drains/Wounds Patient Lines/Drains/Airways Status   Active Line/Drains/Airways    Name:   Placement date:   Placement time:   Site:   Days:   Peripheral IV 10/31/18 Anterior;Left Wrist   10/31/18    -    Wrist   less than 1   External Urinary Catheter   06/07/17    1026    -   511          Intake/Output Last 24 hours No intake or output data in the 24 hours ending 10/31/18 1430  Labs/Imaging Results for orders placed or performed during the hospital encounter of 10/31/18 (from the past 48 hour(s))  CBG monitoring, ED     Status: Abnormal   Collection Time: 10/31/18 11:15 AM  Result Value Ref Range   Glucose-Capillary 150 (H) 70 - 99 mg/dL  Basic metabolic panel     Status: Abnormal   Collection Time: 10/31/18 11:15 AM  Result Value Ref Range   Sodium 139 135 - 145 mmol/L   Potassium 4.7 3.5 - 5.1 mmol/L   Chloride 104 98 - 111 mmol/L   CO2 24 22 - 32 mmol/L   Glucose, Bld 155 (H) 70 - 99 mg/dL   BUN 22 8 - 23 mg/dL   Creatinine, Ser 1.36 (H) 0.44 - 1.00 mg/dL   Calcium 8.1 (L) 8.9 - 10.3  mg/dL   GFR calc non Af Amer 36 (L) >60 mL/min   GFR calc Af Amer 42 (L) >60 mL/min   Anion gap 11 5 - 15    Comment: Performed at [facility] [address], [place], [place] [address]  CBC with Differential     Status: Abnormal   Collection Time: 10/31/18 11:15 AM  Result Value Ref Range   WBC 8.0 4.0 - 10.5 K/uL   RBC 2.85 (L) 3.87 - 5.11 MIL/uL   Hemoglobin 9.0 (L) 12.0 - 15.0 g/dL   HCT 29.3 (L) 36.0 - 46.0 %   MCV 102.8 (H) 80.0 - 100.0 fL   MCH 31.6 26.0 - 34.0 pg   MCHC 30.7 30.0 - 36.0 g/dL   RDW 15.9 (H) 11.5 - 15.5 %   Platelets 161 150 - 400 K/uL   nRBC 0.0 0.0 - 0.2 %   Neutrophils Relative % 78 %   Neutro Abs 6.2 1.7 - 7.7 K/uL   Lymphocytes Relative 10 %   Lymphs Abs 0.8 0.7 - 4.0  K/uL   Monocytes Relative 11 %   Monocytes Absolute 0.9 0.1 - 1.0 K/uL   Eosinophils Relative 0 %   Eosinophils Absolute 0.0 0.0 - 0.5 K/uL   Basophils Relative 0 %   Basophils Absolute 0.0 0.0 - 0.1 K/uL   Immature Granulocytes 1 %   Abs Immature Granulocytes 0.04 0.00 - 0.07 K/uL    Comment: Performed at [facility] [address] [place], [place] [address]   Dg Ankle Complete Right  Result Date: 10/31/2018 CLINICAL DATA:  Acute RIGHT ankle pain following fall. Initial encounter. EXAM: RIGHT ANKLE - COMPLETE 3+ VIEW COMPARISON:  None. FINDINGS: A tiny fracture along the LATERAL calcaneus is noted, age indeterminate. No other acute fracture, subluxation or dislocation identified. Mild LATERAL soft tissue swelling is present. IMPRESSION: Age indeterminate tiny fracture/avulsion off the LATERAL calcaneus. No other acute bony abnormality. Electronically Signed   By: Margarette Canada M.D.   On: 10/31/2018 12:57   Ct Head Wo Contrast  Result Date: 10/31/2018 CLINICAL DATA:  84 year old female with fall and head neck injury. Initial encounter. EXAM: CT HEAD WITHOUT CONTRAST CT CERVICAL SPINE WITHOUT CONTRAST TECHNIQUE: Multidetector CT imaging of the head and cervical spine was performed following the standard protocol without intravenous contrast. Multiplanar CT image reconstructions of the cervical spine were also generated. COMPARISON:  06/07/2017 head CT FINDINGS: CT HEAD FINDINGS Brain: No evidence of acute infarction, hemorrhage, hydrocephalus, extra-axial collection or mass lesion/mass effect. Atrophy and chronic small-vessel white matter ischemic changes again noted. Vascular: Carotid atherosclerotic calcifications noted. Skull: Normal. Negative for fracture or focal lesion. Sinuses/Orbits: No acute finding. Other: None. CT CERVICAL SPINE FINDINGS Alignment: Normal. Skull base and vertebrae: No acute fracture. No primary bone lesion or focal pathologic process. Soft tissues and spinal canal:  No prevertebral fluid or swelling. No visible canal hematoma. Disc levels: Multilevel degenerative disc disease/spondylosis noted, moderate from C5-C7. Moderate multilevel facet arthropathy identified. These findings contribute to mild to moderate central spinal and bony foraminal narrowing at several levels. Upper chest: No acute abnormality. Markedly enlarged thyroid gland noted, RIGHT greater than LEFT. Other: None IMPRESSION: 1. No evidence of acute intracranial abnormality. Atrophy and chronic small-vessel white matter ischemic changes. 2. No static evidence of acute injury to the cervical spine. Degenerative changes as described. 3. Thyroid goiter. Electronically Signed   By: Margarette Canada M.D.   On: 10/31/2018 13:49   Ct Cervical  Spine Wo Contrast  Result Date: 10/31/2018 CLINICAL DATA:  84 year old female with fall and head neck injury. Initial encounter. EXAM: CT HEAD WITHOUT CONTRAST CT CERVICAL SPINE WITHOUT CONTRAST TECHNIQUE: Multidetector CT imaging of the head and cervical spine was performed following the standard protocol without intravenous contrast. Multiplanar CT image reconstructions of the cervical spine were also generated. COMPARISON:  06/07/2017 head CT FINDINGS: CT HEAD FINDINGS Brain: No evidence of acute infarction, hemorrhage, hydrocephalus, extra-axial collection or mass lesion/mass effect. Atrophy and chronic small-vessel white matter ischemic changes again noted. Vascular: Carotid atherosclerotic calcifications noted. Skull: Normal. Negative for fracture or focal lesion. Sinuses/Orbits: No acute finding. Other: None. CT CERVICAL SPINE FINDINGS Alignment: Normal. Skull base and vertebrae: No acute fracture. No primary bone lesion or focal pathologic process. Soft tissues and spinal canal: No prevertebral fluid or swelling. No visible canal hematoma. Disc levels: Multilevel degenerative disc disease/spondylosis noted, moderate from C5-C7. Moderate multilevel facet arthropathy  identified. These findings contribute to mild to moderate central spinal and bony foraminal narrowing at several levels. Upper chest: No acute abnormality. Markedly enlarged thyroid gland noted, RIGHT greater than LEFT. Other: None IMPRESSION: 1. No evidence of acute intracranial abnormality. Atrophy and chronic small-vessel white matter ischemic changes. 2. No static evidence of acute injury to the cervical spine. Degenerative changes as described. 3. Thyroid goiter. Electronically Signed   By: Margarette Canada M.D.   On: 10/31/2018 13:49   Dg Knee Complete 4 Views Right  Result Date: 10/31/2018 CLINICAL DATA:  Acute RIGHT knee pain following fall. Initial encounter. EXAM: RIGHT KNEE - COMPLETE 4+ VIEW COMPARISON:  None. FINDINGS: A mixed transverse/oblique fracture of the distal RIGHT femoral metadiaphysis is noted with 0.5 cm dorsal displacement. There is no definite intra-articular fracture extension identified. Severe tricompartmental degenerative changes are noted. There may be a tiny knee effusion present. No suspicious focal bony lesions are present. IMPRESSION: Distal RIGHT femoral fracture with 0.5 cm dorsal displacement. Severe tricompartmental degenerative changes. Electronically Signed   By: Margarette Canada M.D.   On: 10/31/2018 12:55   Dg Hip Unilat W Or Wo Pelvis 2-3 Views Right  Result Date: 10/31/2018 CLINICAL DATA:  Status post fall EXAM: DG HIP (WITH OR WITHOUT PELVIS) 2-3V RIGHT COMPARISON:  None. FINDINGS: Bones: No fracture or dislocation. Normal bone mineralization. No periostitis. Joints: Normal alignment. No erosive changes. Severe osteoarthritis of bilateral hips with subchondral cystic changes, subchondral sclerosis and marginal osteophytes. Lower lumbar spine spondylosis. Soft tissue: No soft tissue abnormality. No radiopaque foreign body. No chondrocalcinosis. IMPRESSION: Severe osteoarthritis of bilateral hips. Electronically Signed   By: Kathreen Devoid   On: 10/31/2018 12:55    Pending  Labs Unresulted Labs (From admission, onward)    Start     Ordered   11/01/18 [id number]  Basic metabolic panel  Tomorrow morning,   R     10/31/18 1415   11/01/18 0500  CBC  Tomorrow morning,   R     10/31/18 1415   10/31/18 1418  Ferritin  Add-on,   AD     10/31/18 1417   10/31/18 1418  Iron and TIBC  Add-on,   AD     10/31/18 1417   10/31/18 1418  Vitamin B12  Add-on,   AD     10/31/18 1417   10/31/18 1418  Folate RBC  Add-on,   AD     10/31/18 1417   10/31/18 1416  Urinalysis, Routine w reflex microscopic  Once,   STAT  10/31/18 1415   10/31/18 1415  Magnesium  Once,   STAT     10/31/18 1415   10/31/18 1415  Phosphorus  Once,   STAT     10/31/18 1415   10/31/18 1415  TSH  Once,   STAT     10/31/18 1415   10/31/18 1415  Hemoglobin A1c  Once,   STAT     10/31/18 1415   10/31/18 1308  Type and screen [place]  Once,   STAT    Comments: [place]    10/31/18 1308   10/31/18 1201  SARS CORONAVIRUS 2 (TAT 6-24 HRS) Nasopharyngeal Nasopharyngeal Swab  (Asymptomatic/Tier 2 Patients Labs)  Once,   STAT    Question Answer Comment  Is this test for diagnosis or screening Screening   Symptomatic for COVID-19 as defined by CDC No   Hospitalized for COVID-19 No   Admitted to ICU for COVID-19 No   Previously tested for COVID-19 No   Resident in a congregate (group) care setting No   Employed in healthcare setting No   Pregnant No      10/31/18 1201          Vitals/Pain Today's Vitals   10/31/18 1240 10/31/18 1245 10/31/18 1428 10/31/18 1429  BP: 115/68 (!) 111/59    Pulse:  70 75   Resp:  (!) 22 20   Temp:      TempSrc:      SpO2:  97% 99%   Weight:      Height:      PainSc:    6     Isolation Precautions No active isolations  Medications Medications  0.9 %  sodium chloride infusion (has no administration in time range)  acetaminophen (TYLENOL) tablet 650 mg (has no administration in time range)    Or  acetaminophen  (TYLENOL) suppository 650 mg (has no administration in time range)  ondansetron (ZOFRAN) tablet 4 mg (has no administration in time range)    Or  ondansetron (ZOFRAN) injection 4 mg (has no administration in time range)  morphine 2 MG/ML injection 2 mg (has no administration in time range)  aspirin tablet 81 mg (has no administration in time range)  amLODipine (NORVASC) tablet 5 mg (has no administration in time range)  simvastatin (ZOCOR) tablet 10 mg (has no administration in time range)  citalopram (CELEXA) tablet 40 mg (has no administration in time range)  pantoprazole (PROTONIX) EC tablet 40 mg (has no administration in time range)  carbidopa-levodopa (SINEMET IR) 25-100 MG per tablet immediate release 1 tablet (has no administration in time range)  clonazePAM (KLONOPIN) tablet 1 mg (has no administration in time range)  primidone (MYSOLINE) tablet 250 mg (has no administration in time range)  insulin aspart (novoLOG) injection 0-15 Units (has no administration in time range)  insulin aspart (novoLOG) injection 0-5 Units (has no administration in time range)  sodium chloride 0.9 % bolus 1,000 mL (1,000 mLs Intravenous New Bag/Given 10/31/18 1239)  ondansetron (ZOFRAN) injection 4 mg (4 mg Intravenous Given 10/31/18 1241)  fentaNYL (SUBLIMAZE) injection 50 mcg (50 mcg Intravenous Given 10/31/18 1243)    Mobility walks with device High fall risk   Focused Assessments    R Recommendations: See Admitting Provider Note  Report given to:   Additional Notes:

## 2018-10-31 NOTE — ED Provider Notes (Signed)
[facility] EMERGENCY DEPARTMENT Provider Note   CSN: [id number] Arrival date & time: 10/31/18  1110     History   Chief Complaint Chief Complaint  Patient presents with   Fall   Leg Pain    HPI Kristen Mason is a 84 y.o. female.     HPI  Kristen Mason is a 84 y.o. female, with a history of DM, HTN, GERD, presenting to the ED with right leg pain following a slip and fall yesterday.  Accompanied by her daughter at the bedside. She is complaining of severe pain to the right knee, femur, and hip, throbbing, nonradiating from the upper leg.  She adds that she has sensory dysfunction in the bilateral lower extremities at baseline due to significant neuropathy. She did hit her head, but denies LOC.  Denies anticoagulation.  Denies nausea/vomiting, neck/back pain, chest pain, abdominal pain, shortness of breath, acute numbness, recent illness, or any other complaints.  Past Medical History:  Diagnosis Date   Diabetes [facility])    Essential tremor    Fall 06/07/2017   rib fractures   GERD (gastroesophageal reflux disease)    Hypertension    Peripheral neuropathy     Patient Active Problem List   Diagnosis Date Noted   Femur fracture, right ([place]) 10/31/2018   Acute on chronic kidney failure ([place]) 10/31/2018   Macrocytic anemia 10/31/2018   Falls 07/24/2017   COPD exacerbation ([place]) 06/16/2017   Parkinson's disease ([place]) 06/16/2017   Essential tremor 06/16/2017   Peripheral neuropathy 06/16/2017   Depression 06/16/2017   Anxiety 06/16/2017   Acute respiratory failure with hypoxia ([place]) 06/07/2017   Fall at home, initial encounter 06/07/2017   Multiple rib fractures 06/07/2017   Controlled diabetes mellitus ([place]) 06/07/2017   Essential hypertension 06/07/2017   Tobacco dependence 06/07/2017    Past Surgical History:  Procedure Laterality Date   ABDOMINAL HYSTERECTOMY  1975   BREAST LUMPECTOMY Right    non cancerous      OB History   No obstetric history on file.      Home Medications    Prior to Admission medications   Medication Sig Start Date End Date Taking? Authorizing Provider  amLODipine (NORVASC) 5 MG tablet Take 5 mg by mouth daily.   Yes [provider]  aspirin 81 MG tablet Take 81 mg by mouth daily.   Yes [provider]  carbidopa-levodopa (SINEMET IR) 25-100 MG tablet Take 1 tablet by mouth 3 (three) times daily.   Yes [provider]  cetirizine (ZYRTEC) 10 MG tablet Take 10 mg by mouth daily.   Yes [provider]  citalopram (CELEXA) 40 MG tablet Take 40 mg by mouth daily.   Yes [provider]  clonazePAM (KLONOPIN) 1 MG tablet Take 1 tablet (1 mg total) by mouth at bedtime. 06/11/17  Yes Ghimire, Henreitta Leber, MD  dicyclomine (BENTYL) 10 MG capsule Take 10 mg by mouth 4 (four) times daily - after meals and at bedtime.   Yes [provider]  gabapentin (NEURONTIN) 800 MG tablet Take 800 mg by mouth 3 (three) times daily.   Yes [provider]  glimepiride (AMARYL) 1 MG tablet Take 1 mg by mouth daily. 06/04/18  Yes [provider]  hydrOXYzine (ATARAX/VISTARIL) 25 MG tablet Take 25 mg by mouth at bedtime.   Yes [provider]  lisinopril (PRINIVIL,ZESTRIL) 2.5 MG tablet Take 2.5 mg by mouth 2 (two) times daily. take 1 tab po daily for htn  Yes [provider]  Omega-3 Fatty Acids (FISH OIL) 1000 MG CPDR Take 1,000 mg by mouth daily.   Yes [provider]  pantoprazole (PROTONIX) 40 MG tablet Take 40 mg by mouth daily.   Yes [provider]  primidone (MYSOLINE) 250 MG tablet Take 250 mg by mouth 2 (two) times daily.   Yes [provider]  simvastatin (ZOCOR) 10 MG tablet Take 10 mg by mouth daily. 05/28/18  Yes [provider]  traMADol (ULTRAM) 50 MG tablet Take 1 tablet (50 mg total) by mouth every 6 (six) hours as needed. Patient taking differently: Take 50 mg  by mouth every 6 (six) hours as needed for moderate pain.  06/11/17  Yes Ghimire, Henreitta Leber, MD    Family History No family history on file.  Social History Social History   Tobacco Use   Smoking status: Former Smoker    Types: Cigarettes   Smokeless tobacco: Former Network engineer Use Topics   Alcohol use: No    Alcohol/week: 0.0 standard drinks   Drug use: No     Allergies   Statins   Review of Systems Review of Systems  Constitutional: Negative for diaphoresis.  Respiratory: Negative for cough and shortness of breath.   Cardiovascular: Negative for chest pain.  Gastrointestinal: Negative for abdominal pain, diarrhea, nausea and vomiting.  Musculoskeletal: Positive for arthralgias and myalgias. Negative for back pain and neck pain.  Neurological: Negative for dizziness, syncope, weakness, numbness and headaches.  All other systems reviewed and are negative.    Physical Exam Updated Vital Signs BP (!) 102/51 (BP Location: Right Arm)    Pulse 70    Temp 98.2 F (36.8 C) (Oral)    Resp 17    Ht 5\' 5"  (1.651 m)    Wt 70.3 kg    SpO2 96%    BMI 25.79 kg/m   Physical Exam Vitals signs and nursing note reviewed.  Constitutional:      General: She is not in acute distress.    Appearance: She is well-developed. She is not diaphoretic.  HENT:     Head: Normocephalic and atraumatic.     Comments: No tenderness, swelling, wounds, deformity, or instability noted to the head or face.    Mouth/Throat:     Mouth: Mucous membranes are moist.     Pharynx: Oropharynx is clear.  Eyes:     Conjunctiva/sclera: Conjunctivae normal.  Neck:     Musculoskeletal: Neck supple.  Cardiovascular:     Rate and Rhythm: Normal rate and regular rhythm.     Pulses: Normal pulses.          Radial pulses are 2+ on the right side and 2+ on the left side.       Dorsalis pedis pulses are 2+ on the right side and 2+ on the left side.       Posterior tibial pulses are 2+ on the right side and  2+ on the left side.     Comments: Tactile temperature in the extremities appropriate and equal bilaterally. Pulmonary:     Effort: Pulmonary effort is normal. No respiratory distress.     Breath sounds: Normal breath sounds.  Abdominal:     Palpations: Abdomen is soft.     Tenderness: There is no abdominal tenderness. There is no guarding.  Musculoskeletal:     Right lower leg: No edema.     Left lower leg: No edema.     Comments: Tenderness and swelling  to the right mid and distal femur.  No blanching of the skin.  Adequate capillary refill is still present in the skin overlying the femur.  Compartments are still soft. Tenderness and swelling extends into the right knee and right lateral hip.  Pelvis appears to be stable. No swelling, color change, or tenderness to the right tibia or fibula. There is some swelling and erythema to the anterior right ankle.  Normal motor function intact in all other extremities.  No pain with range of motion of the major joints of the other upper and lower extremities.  No midline spinal tenderness.   Overall trauma exam performed without any abnormalities noted other than those mentioned.  Skin:    General: Skin is warm and dry.  Neurological:     Mental Status: She is alert.  Psychiatric:        Mood and Affect: Mood and affect normal.        Speech: Speech normal.        Behavior: Behavior normal.      ED Treatments / Results  Labs (all labs ordered are listed, but only abnormal results are displayed) Labs Reviewed  BASIC METABOLIC PANEL - Abnormal; Notable for the following components:      Result Value   Glucose, Bld 155 (*)    Creatinine, Ser 1.36 (*)    Calcium 8.1 (*)    GFR calc non Af Amer 36 (*)    GFR calc Af Amer 42 (*)    All other components within normal limits  CBC WITH DIFFERENTIAL/PLATELET - Abnormal; Notable for the following components:   RBC 2.85 (*)    Hemoglobin 9.0 (*)    HCT 29.3 (*)    MCV 102.8 (*)    RDW  15.9 (*)    All other components within normal limits  CBG MONITORING, ED - Abnormal; Notable for the following components:   Glucose-Capillary 150 (*)    All other components within normal limits  SARS CORONAVIRUS 2 (TAT 6-24 HRS)  MAGNESIUM  PHOSPHORUS  TSH  HEMOGLOBIN A1C  URINALYSIS, ROUTINE W REFLEX MICROSCOPIC  FERRITIN  IRON AND TIBC  VITAMIN B12  FOLATE RBC  TYPE AND SCREEN    Hemoglobin  Date Value Ref Range Status  10/31/2018 9.0 (L) 12.0 - 15.0 g/dL Final  06/08/2017 11.9 (L) 12.0 - 15.0 g/dL Final  06/07/2017 12.4 12.0 - 15.0 g/dL Final    EKG EKG Interpretation  Date/Time:  Monday October 31 2018 11:25:47 EDT Ventricular Rate:  72 PR Interval:    QRS Duration: 90 QT Interval:  379 QTC Calculation: 415 R Axis:   21 Text Interpretation:  Sinus rhythm Borderline repolarization abnormality Confirmed by Sherwood Gambler [phone]) on 10/31/2018 11:29:50 AM   Radiology Dg Ankle Complete Right  Result Date: 10/31/2018 CLINICAL DATA:  Acute RIGHT ankle pain following fall. Initial encounter. EXAM: RIGHT ANKLE - COMPLETE 3+ VIEW COMPARISON:  None. FINDINGS: A tiny fracture along the LATERAL calcaneus is noted, age indeterminate. No other acute fracture, subluxation or dislocation identified. Mild LATERAL soft tissue swelling is present. IMPRESSION: Age indeterminate tiny fracture/avulsion off the LATERAL calcaneus. No other acute bony abnormality. Electronically Signed   By: Margarette Canada M.D.   On: 10/31/2018 12:57   Ct Head Wo Contrast  Result Date: 10/31/2018 CLINICAL DATA:  84 year old female with fall and head neck injury. Initial encounter. EXAM: CT HEAD WITHOUT CONTRAST CT CERVICAL SPINE WITHOUT CONTRAST TECHNIQUE: Multidetector CT imaging of the head and cervical spine  was performed following the standard protocol without intravenous contrast. Multiplanar CT image reconstructions of the cervical spine were also generated. COMPARISON:  06/07/2017 head CT FINDINGS: CT  HEAD FINDINGS Brain: No evidence of acute infarction, hemorrhage, hydrocephalus, extra-axial collection or mass lesion/mass effect. Atrophy and chronic small-vessel white matter ischemic changes again noted. Vascular: Carotid atherosclerotic calcifications noted. Skull: Normal. Negative for fracture or focal lesion. Sinuses/Orbits: No acute finding. Other: None. CT CERVICAL SPINE FINDINGS Alignment: Normal. Skull base and vertebrae: No acute fracture. No primary bone lesion or focal pathologic process. Soft tissues and spinal canal: No prevertebral fluid or swelling. No visible canal hematoma. Disc levels: Multilevel degenerative disc disease/spondylosis noted, moderate from C5-C7. Moderate multilevel facet arthropathy identified. These findings contribute to mild to moderate central spinal and bony foraminal narrowing at several levels. Upper chest: No acute abnormality. Markedly enlarged thyroid gland noted, RIGHT greater than LEFT. Other: None IMPRESSION: 1. No evidence of acute intracranial abnormality. Atrophy and chronic small-vessel white matter ischemic changes. 2. No static evidence of acute injury to the cervical spine. Degenerative changes as described. 3. Thyroid goiter. Electronically Signed   By: Margarette Canada M.D.   On: 10/31/2018 13:49   Ct Cervical Spine Wo Contrast  Result Date: 10/31/2018 CLINICAL DATA:  84 year old female with fall and head neck injury. Initial encounter. EXAM: CT HEAD WITHOUT CONTRAST CT CERVICAL SPINE WITHOUT CONTRAST TECHNIQUE: Multidetector CT imaging of the head and cervical spine was performed following the standard protocol without intravenous contrast. Multiplanar CT image reconstructions of the cervical spine were also generated. COMPARISON:  06/07/2017 head CT FINDINGS: CT HEAD FINDINGS Brain: No evidence of acute infarction, hemorrhage, hydrocephalus, extra-axial collection or mass lesion/mass effect. Atrophy and chronic small-vessel white matter ischemic changes  again noted. Vascular: Carotid atherosclerotic calcifications noted. Skull: Normal. Negative for fracture or focal lesion. Sinuses/Orbits: No acute finding. Other: None. CT CERVICAL SPINE FINDINGS Alignment: Normal. Skull base and vertebrae: No acute fracture. No primary bone lesion or focal pathologic process. Soft tissues and spinal canal: No prevertebral fluid or swelling. No visible canal hematoma. Disc levels: Multilevel degenerative disc disease/spondylosis noted, moderate from C5-C7. Moderate multilevel facet arthropathy identified. These findings contribute to mild to moderate central spinal and bony foraminal narrowing at several levels. Upper chest: No acute abnormality. Markedly enlarged thyroid gland noted, RIGHT greater than LEFT. Other: None IMPRESSION: 1. No evidence of acute intracranial abnormality. Atrophy and chronic small-vessel white matter ischemic changes. 2. No static evidence of acute injury to the cervical spine. Degenerative changes as described. 3. Thyroid goiter. Electronically Signed   By: Margarette Canada M.D.   On: 10/31/2018 13:49   Dg Knee Complete 4 Views Right  Result Date: 10/31/2018 CLINICAL DATA:  Acute RIGHT knee pain following fall. Initial encounter. EXAM: RIGHT KNEE - COMPLETE 4+ VIEW COMPARISON:  None. FINDINGS: A mixed transverse/oblique fracture of the distal RIGHT femoral metadiaphysis is noted with 0.5 cm dorsal displacement. There is no definite intra-articular fracture extension identified. Severe tricompartmental degenerative changes are noted. There may be a tiny knee effusion present. No suspicious focal bony lesions are present. IMPRESSION: Distal RIGHT femoral fracture with 0.5 cm dorsal displacement. Severe tricompartmental degenerative changes. Electronically Signed   By: Margarette Canada M.D.   On: 10/31/2018 12:55   Dg Hip Unilat W Or Wo Pelvis 2-3 Views Right  Result Date: 10/31/2018 CLINICAL DATA:  Status post fall EXAM: DG HIP (WITH OR WITHOUT PELVIS) 2-3V  RIGHT COMPARISON:  None. FINDINGS: Bones: No fracture or dislocation. Normal  bone mineralization. No periostitis. Joints: Normal alignment. No erosive changes. Severe osteoarthritis of bilateral hips with subchondral cystic changes, subchondral sclerosis and marginal osteophytes. Lower lumbar spine spondylosis. Soft tissue: No soft tissue abnormality. No radiopaque foreign body. No chondrocalcinosis. IMPRESSION: Severe osteoarthritis of bilateral hips. Electronically Signed   By: Kathreen Devoid   On: 10/31/2018 12:55    Procedures Procedures (including critical care time)  Medications Ordered in ED Medications  0.9 %  sodium chloride infusion (has no administration in time range)  acetaminophen (TYLENOL) tablet 650 mg (has no administration in time range)    Or  acetaminophen (TYLENOL) suppository 650 mg (has no administration in time range)  ondansetron (ZOFRAN) tablet 4 mg (has no administration in time range)    Or  ondansetron (ZOFRAN) injection 4 mg (has no administration in time range)  morphine 2 MG/ML injection 2 mg (has no administration in time range)  sodium chloride 0.9 % bolus 1,000 mL (1,000 mLs Intravenous New Bag/Given 10/31/18 1239)  ondansetron (ZOFRAN) injection 4 mg (4 mg Intravenous Given 10/31/18 1241)  fentaNYL (SUBLIMAZE) injection 50 mcg (50 mcg Intravenous Given 10/31/18 1243)     Initial Impression / Assessment and Plan / ED Course  I have reviewed the triage vital signs and the nursing notes.  Pertinent labs & imaging results that were available during my care of the patient were reviewed by me and considered in my medical decision making (see chart for details).  Clinical Course as of Oct 31 1418  Mon Oct 31, 2018  1327 Spoke with Dr. Alvan Dame, orthopedic surgeon.  We discussed patient's blood pressure change as well as decreased hemoglobin.  He recommends medical admission and someone from the orthopedic team will come evaluate the patient.   [SJ]  47 Spoke with  Dr. Doristine Bosworth, hospitalist. Agrees to admit the patient.    [SJ]    Clinical Course User Index [SJ] Eliab Closson C, PA-C       Patient presents for evaluation following a fall that occurred yesterday. Patient has evidence of distal femur fracture on x-ray.  Distal pulses present. Age-indeterminate calcaneal avulsion fracture also noted on x-ray. There is also some concern regarding the patient's blood pressure change on scene with EMS.  It could be that the patient's pressure was initially elevated due to pain and simply improved once pain was better controlled. However, there is still suspicion for blood loss from the femur fracture based on the difference in her hemoglobin, 11.9 last year and 9.0 today. Patient admitted for further management.  Findings and plan of care discussed with Sherwood Gambler, MD. Dr. Regenia Skeeter personally evaluated and examined this patient.   Vitals:   10/31/18 1126 10/31/18 1200 10/31/18 1240 10/31/18 1245  BP: (!) 102/51 113/78 115/68 (!) 111/59  Pulse: 70 77  70  Resp: 17 16  (!) 22  Temp: 98.2 F (36.8 C)     TempSrc: Oral     SpO2: 96% 93%  97%  Weight:      Height:         Final Clinical Impressions(s) / ED Diagnoses   Final diagnoses:  Fall, initial encounter  Closed fracture of distal end of right femur, unspecified fracture morphology, initial encounter [facility])    ED Discharge Orders    None       Layla Maw 10/31/18 [place], [place], MD 11/01/18 [phone]

## 2018-10-31 NOTE — ED Triage Notes (Signed)
Pt from home via ems; pt had unwitnessed mechanical fall yesterday afternoon at 1500 while trying to wipe up water on ground; no loc, not on thinners, did not hit head; evaluated by EMS yesterday but did not want to come to hospital; c/o tenderness to proximal R leg, instability R patella; pain increased throughout night; normally mobile with walker, endorses increase in falls; neuropathy in legs which pt says contributes to falls; 0000000 systolic initially, 50 fentanyl given PTA, BP dropped to 123XX123 systolic; 123XX123 mcg total given PTA  108/80 P 84 RR 16 92% RA after fentanyl administration, placed on 2L,100% CBG 167 T 98.72F

## 2018-11-01 ENCOUNTER — Inpatient Hospital Stay (HOSPITAL_COMMUNITY): Payer: Medicare Other | Admitting: Certified Registered"

## 2018-11-01 ENCOUNTER — Encounter (HOSPITAL_COMMUNITY)
Admission: EM | Disposition: A | Discharge status: Skilled Nursing Facility | Payer: Self-pay | Source: Home / Self Care | Attending: Family Medicine

## 2018-11-01 ENCOUNTER — Inpatient Hospital Stay (HOSPITAL_COMMUNITY): Payer: Medicare Other

## 2018-11-01 ENCOUNTER — Encounter (HOSPITAL_COMMUNITY): Payer: Self-pay

## 2018-11-01 HISTORY — PX: FEMUR IM NAIL: SHX1597

## 2018-11-01 LAB — HEMOGLOBIN AND HEMATOCRIT, BLOOD
HCT: 31.8 % — ABNORMAL LOW (ref 36.0–46.0)
Hemoglobin: 10.1 g/dL — ABNORMAL LOW (ref 12.0–15.0)

## 2018-11-01 LAB — PREPARE RBC (CROSSMATCH)

## 2018-11-01 LAB — BASIC METABOLIC PANEL
Anion gap: 9 (ref 5–15)
BUN: 24 mg/dL — ABNORMAL HIGH (ref 8–23)
CO2: 23 mmol/L (ref 22–32)
Calcium: 7.4 mg/dL — ABNORMAL LOW (ref 8.9–10.3)
Chloride: 106 mmol/L (ref 98–111)
Creatinine, Ser: 1.26 mg/dL — ABNORMAL HIGH (ref 0.44–1.00)
GFR calc Af Amer: 46 mL/min — ABNORMAL LOW (ref >60)
GFR calc non Af Amer: 40 mL/min — ABNORMAL LOW (ref >60)
Glucose, Bld: 117 mg/dL — ABNORMAL HIGH (ref 70–99)
Potassium: 4 mmol/L (ref 3.5–5.1)
Sodium: 138 mmol/L (ref 135–145)

## 2018-11-01 LAB — GLUCOSE, CAPILLARY
Glucose-Capillary: 102 mg/dL — ABNORMAL HIGH (ref 70–99)
Glucose-Capillary: 104 mg/dL — ABNORMAL HIGH (ref 70–99)
Glucose-Capillary: 110 mg/dL — ABNORMAL HIGH (ref 70–99)
Glucose-Capillary: 113 mg/dL — ABNORMAL HIGH (ref 70–99)
Glucose-Capillary: 113 mg/dL — ABNORMAL HIGH (ref 70–99)
Glucose-Capillary: 114 mg/dL — ABNORMAL HIGH (ref 70–99)
Glucose-Capillary: 126 mg/dL — ABNORMAL HIGH (ref 70–99)
Glucose-Capillary: 131 mg/dL — ABNORMAL HIGH (ref 70–99)

## 2018-11-01 LAB — CBC
HCT: 21.3 % — ABNORMAL LOW (ref 36.0–46.0)
Hemoglobin: 6.9 g/dL — CL (ref 12.0–15.0)
MCH: 32.2 pg (ref 26.0–34.0)
MCHC: 32.4 g/dL (ref 30.0–36.0)
MCV: 99.5 fL (ref 80.0–100.0)
Platelets: 104 10*3/uL — ABNORMAL LOW (ref 150–400)
RBC: 2.14 MIL/uL — ABNORMAL LOW (ref 3.87–5.11)
RDW: 15.9 % — ABNORMAL HIGH (ref 11.5–15.5)
WBC: 4.7 10*3/uL (ref 4.0–10.5)
nRBC: 0 % (ref 0.0–0.2)

## 2018-11-01 SURGERY — INSERTION, INTRAMEDULLARY ROD, FEMUR, RETROGRADE
Anesthesia: General | Laterality: Right

## 2018-11-01 MED ORDER — 0.9 % SODIUM CHLORIDE (POUR BTL) OPTIME
TOPICAL | Status: DC | PRN
  Administered 2018-11-01: 1000 mL

## 2018-11-01 MED ORDER — SODIUM CHLORIDE 0.9 % IV SOLN
INTRAVENOUS | Status: DC | PRN
  Administered 2018-11-01: 50 ug/min via INTRAVENOUS

## 2018-11-01 MED ORDER — CYANOCOBALAMIN 1000 MCG/ML IJ SOLN
1000.0000 ug | Freq: Once | INTRAMUSCULAR | Status: AC
  Administered 2018-11-02: 09:00:00 1000 ug via INTRAMUSCULAR
  Filled 2018-11-01: qty 1

## 2018-11-01 MED ORDER — ENOXAPARIN SODIUM 40 MG/0.4ML ~~LOC~~ SOLN
40.0000 mg | SUBCUTANEOUS | Status: DC
  Administered 2018-11-02 – 2018-11-07 (×6): 40 mg via SUBCUTANEOUS
  Filled 2018-11-01 (×7): qty 0.4

## 2018-11-01 MED ORDER — ONDANSETRON HCL 4 MG/2ML IJ SOLN
INTRAMUSCULAR | Status: AC
  Filled 2018-11-01: qty 2

## 2018-11-01 MED ORDER — MEPERIDINE HCL 25 MG/ML IJ SOLN: 6.2500 mg | INTRAMUSCULAR | Status: DC | PRN

## 2018-11-01 MED ORDER — CITALOPRAM HYDROBROMIDE 20 MG PO TABS
20.0000 mg | ORAL_TABLET | Freq: Every day | ORAL | Status: DC
  Administered 2018-11-02 – 2018-11-10 (×9): 20 mg via ORAL
  Filled 2018-11-01 (×9): qty 1

## 2018-11-01 MED ORDER — SODIUM CHLORIDE 0.9% IV SOLUTION: Freq: Once | INTRAVENOUS | Status: DC

## 2018-11-01 MED ORDER — DEXAMETHASONE SODIUM PHOSPHATE 10 MG/ML IJ SOLN
INTRAMUSCULAR | Status: DC | PRN
  Administered 2018-11-01: 4 mg via INTRAVENOUS

## 2018-11-01 MED ORDER — FENTANYL CITRATE (PF) 100 MCG/2ML IJ SOLN
INTRAMUSCULAR | Status: AC
  Filled 2018-11-01: qty 2

## 2018-11-01 MED ORDER — PROPOFOL 10 MG/ML IV BOLUS
INTRAVENOUS | Status: DC | PRN
  Administered 2018-11-01: 20 mg via INTRAVENOUS
  Administered 2018-11-01: 100 mg via INTRAVENOUS
  Administered 2018-11-01: 30 mg via INTRAVENOUS

## 2018-11-01 MED ORDER — LIDOCAINE 2% (20 MG/ML) 5 ML SYRINGE
INTRAMUSCULAR | Status: DC | PRN
  Administered 2018-11-01 (×2): 40 mg via INTRAVENOUS

## 2018-11-01 MED ORDER — ENOXAPARIN SODIUM 40 MG/0.4ML ~~LOC~~ SOLN
40.0000 mg | SUBCUTANEOUS | Status: DC
  Filled 2018-11-01: qty 0.4

## 2018-11-01 MED ORDER — ONDANSETRON HCL 4 MG/2ML IJ SOLN: 4.0000 mg | Freq: Once | INTRAMUSCULAR | Status: DC | PRN

## 2018-11-01 MED ORDER — HYDROMORPHONE HCL 1 MG/ML IJ SOLN
INTRAMUSCULAR | Status: AC
  Filled 2018-11-01: qty 1

## 2018-11-01 MED ORDER — FENTANYL CITRATE (PF) 100 MCG/2ML IJ SOLN
25.0000 ug | INTRAMUSCULAR | Status: DC | PRN
  Administered 2018-11-01 (×3): 25 ug via INTRAVENOUS

## 2018-11-01 MED ORDER — PHENYLEPHRINE 40 MCG/ML (10ML) SYRINGE FOR IV PUSH (FOR BLOOD PRESSURE SUPPORT)
PREFILLED_SYRINGE | INTRAVENOUS | Status: DC | PRN
  Administered 2018-11-01: 80 ug via INTRAVENOUS

## 2018-11-01 MED ORDER — CEFAZOLIN SODIUM-DEXTROSE 2-4 GM/100ML-% IV SOLN
2.0000 g | Freq: Once | INTRAVENOUS | Status: AC
  Administered 2018-11-01: 2 g via INTRAVENOUS

## 2018-11-01 MED ORDER — SUGAMMADEX SODIUM 200 MG/2ML IV SOLN
INTRAVENOUS | Status: DC | PRN
  Administered 2018-11-01: 150 mg via INTRAVENOUS

## 2018-11-01 MED ORDER — CEFAZOLIN SODIUM-DEXTROSE 1-4 GM/50ML-% IV SOLN
1.0000 g | Freq: Three times a day (TID) | INTRAVENOUS | Status: AC
  Administered 2018-11-01 – 2018-11-02 (×3): 1 g via INTRAVENOUS
  Filled 2018-11-01 (×3): qty 50

## 2018-11-01 MED ORDER — LACTATED RINGERS IV SOLN
INTRAVENOUS | Status: DC | PRN
  Administered 2018-11-01 (×2): via INTRAVENOUS

## 2018-11-01 MED ORDER — PROPOFOL 10 MG/ML IV BOLUS
INTRAVENOUS | Status: AC
  Filled 2018-11-01: qty 20

## 2018-11-01 MED ORDER — HYDROMORPHONE HCL 1 MG/ML IJ SOLN
0.5000 mg | INTRAMUSCULAR | Status: DC | PRN
  Administered 2018-11-01: 0.25 mg via INTRAVENOUS

## 2018-11-01 MED ORDER — LACTATED RINGERS IV SOLN
INTRAVENOUS | Status: DC
  Administered 2018-11-01 – 2018-11-02 (×2): via INTRAVENOUS

## 2018-11-01 MED ORDER — DEXMEDETOMIDINE HCL 200 MCG/2ML IV SOLN
INTRAVENOUS | Status: DC | PRN
  Administered 2018-11-01: 8 ug via INTRAVENOUS

## 2018-11-01 MED ORDER — FENTANYL CITRATE (PF) 250 MCG/5ML IJ SOLN
INTRAMUSCULAR | Status: AC
  Filled 2018-11-01: qty 5

## 2018-11-01 MED ORDER — ROCURONIUM BROMIDE 10 MG/ML (PF) SYRINGE
PREFILLED_SYRINGE | INTRAVENOUS | Status: DC | PRN
  Administered 2018-11-01: 50 mg via INTRAVENOUS
  Administered 2018-11-01: 30 mg via INTRAVENOUS
  Administered 2018-11-01: 20 mg via INTRAVENOUS

## 2018-11-01 MED ORDER — FENTANYL CITRATE (PF) 250 MCG/5ML IJ SOLN
INTRAMUSCULAR | Status: DC | PRN
  Administered 2018-11-01: 50 ug via INTRAVENOUS
  Administered 2018-11-01 (×2): 100 ug via INTRAVENOUS

## 2018-11-01 MED ORDER — ACETAMINOPHEN 500 MG PO TABS
500.0000 mg | ORAL_TABLET | Freq: Three times a day (TID) | ORAL | Status: DC
  Administered 2018-11-01 – 2018-11-10 (×24): 500 mg via ORAL
  Filled 2018-11-01 (×24): qty 1

## 2018-11-01 MED ORDER — HYDROCODONE-ACETAMINOPHEN 5-325 MG PO TABS
1.0000 | ORAL_TABLET | ORAL | Status: DC | PRN
  Administered 2018-11-02: 08:00:00 1 via ORAL
  Filled 2018-11-01: qty 1

## 2018-11-01 SURGICAL SUPPLY — 70 items
BIT DRILL CALIBRATED 4.3MMX365 (DRILL) IMPLANT
BIT DRILL CROWE PNT TWST 4.5MM (DRILL) IMPLANT
BNDG COHESIVE 4X5 TAN STRL (GAUZE/BANDAGES/DRESSINGS) ×3 IMPLANT
BNDG ELASTIC 4X5.8 VLCR STR LF (GAUZE/BANDAGES/DRESSINGS) ×3 IMPLANT
BNDG ELASTIC 6X5.8 VLCR STR LF (GAUZE/BANDAGES/DRESSINGS) ×3 IMPLANT
BNDG GAUZE ELAST 4 BULKY (GAUZE/BANDAGES/DRESSINGS) ×3 IMPLANT
BRUSH SCRUB EZ PLAIN DRY (MISCELLANEOUS) ×6 IMPLANT
CANISTER SUCT 3000ML PPV (MISCELLANEOUS) ×3 IMPLANT
COVER SURGICAL LIGHT HANDLE (MISCELLANEOUS) ×6 IMPLANT
DRAPE C-ARM 42X72 X-RAY (DRAPES) ×3 IMPLANT
DRAPE C-ARMOR (DRAPES) ×3 IMPLANT
DRAPE IMP U-DRAPE 54X76 (DRAPES) ×3 IMPLANT
DRAPE INCISE IOBAN 66X45 STRL (DRAPES) ×3 IMPLANT
DRAPE ORTHO SPLIT 77X108 STRL (DRAPES) ×6
DRAPE SURG ORHT 6 SPLT 77X108 (DRAPES) ×3 IMPLANT
DRAPE U-SHAPE 47X51 STRL (DRAPES) ×3 IMPLANT
DRILL CALIBRATED 4.3MMX365 (DRILL) ×3
DRILL CROWE POINT TWIST 4.5MM (DRILL) ×3
DRSG ADAPTIC 3X8 NADH LF (GAUZE/BANDAGES/DRESSINGS) ×3 IMPLANT
DRSG MEPILEX BORDER 4X4 (GAUZE/BANDAGES/DRESSINGS) ×2 IMPLANT
DRSG MEPILEX BORDER 4X8 (GAUZE/BANDAGES/DRESSINGS) ×2 IMPLANT
DRSG PAD ABDOMINAL 8X10 ST (GAUZE/BANDAGES/DRESSINGS) ×12 IMPLANT
ELECT REM PT RETURN 9FT ADLT (ELECTROSURGICAL) ×3
ELECTRODE REM PT RTRN 9FT ADLT (ELECTROSURGICAL) ×1 IMPLANT
GAUZE SPONGE 4X4 12PLY STRL (GAUZE/BANDAGES/DRESSINGS) ×3 IMPLANT
GAUZE XEROFORM 1X8 LF (GAUZE/BANDAGES/DRESSINGS) ×3 IMPLANT
GLOVE BIO SURGEON STRL SZ7.5 (GLOVE) ×3 IMPLANT
GLOVE BIO SURGEON STRL SZ8 (GLOVE) ×5 IMPLANT
GLOVE BIOGEL PI IND STRL 7.5 (GLOVE) ×1 IMPLANT
GLOVE BIOGEL PI IND STRL 8 (GLOVE) ×1 IMPLANT
GLOVE BIOGEL PI INDICATOR 7.5 (GLOVE) ×2
GLOVE BIOGEL PI INDICATOR 8 (GLOVE) ×6
GOWN STRL REUS W/ TWL LRG LVL3 (GOWN DISPOSABLE) ×2 IMPLANT
GOWN STRL REUS W/ TWL XL LVL3 (GOWN DISPOSABLE) ×1 IMPLANT
GOWN STRL REUS W/TWL LRG LVL3 (GOWN DISPOSABLE) ×4
GOWN STRL REUS W/TWL XL LVL3 (GOWN DISPOSABLE) ×2
GUIDEPIN 3.2X17.5 THRD DISP (PIN) ×2 IMPLANT
GUIDEWIRE BEAD TIP (WIRE) ×2 IMPLANT
KIT BASIN OR (CUSTOM PROCEDURE TRAY) ×3 IMPLANT
KIT TURNOVER KIT B (KITS) ×3 IMPLANT
NAIL FEM RETRO 10.5X380 (Nail) ×2 IMPLANT
NS IRRIG 1000ML POUR BTL (IV SOLUTION) ×3 IMPLANT
PACK ORTHO EXTREMITY (CUSTOM PROCEDURE TRAY) ×3 IMPLANT
PACK TOTAL JOINT (CUSTOM PROCEDURE TRAY) ×3 IMPLANT
PACK UNIVERSAL I (CUSTOM PROCEDURE TRAY) ×3 IMPLANT
PAD ARMBOARD 7.5X6 YLW CONV (MISCELLANEOUS) ×6 IMPLANT
PAD CAST 4YDX4 CTTN HI CHSV (CAST SUPPLIES) ×1 IMPLANT
PADDING CAST COTTON 4X4 STRL (CAST SUPPLIES) ×2
PADDING CAST COTTON 6X4 STRL (CAST SUPPLIES) ×3 IMPLANT
SCREW CORT TI DBL LEAD 5X32 (Screw) ×2 IMPLANT
SCREW CORT TI DBL LEAD 5X34 (Screw) ×2 IMPLANT
SCREW CORT TI DBL LEAD 5X60 (Screw) ×4 IMPLANT
SCREW CORT TI DBL LEAD 5X65 (Screw) ×4 IMPLANT
SCREW CORT TI DBL LEAD 5X80 (Screw) ×2 IMPLANT
SPONGE LAP 18X18 RF (DISPOSABLE) ×1 IMPLANT
STAPLER VISISTAT 35W (STAPLE) ×3 IMPLANT
STOCKINETTE IMPERVIOUS LG (DRAPES) ×3 IMPLANT
SUT ETHILON 2 0 PSLX (SUTURE) ×4 IMPLANT
SUT PROLENE 3 0 PS 2 (SUTURE) IMPLANT
SUT VIC AB 0 CT1 27 (SUTURE) ×2
SUT VIC AB 0 CT1 27XBRD ANBCTR (SUTURE) ×2 IMPLANT
SUT VIC AB 2-0 CT1 27 (SUTURE) ×4
SUT VIC AB 2-0 CT1 TAPERPNT 27 (SUTURE) ×2 IMPLANT
SUT VIC AB 2-0 CT3 27 (SUTURE) IMPLANT
TOWEL GREEN STERILE (TOWEL DISPOSABLE) ×6 IMPLANT
TOWEL GREEN STERILE FF (TOWEL DISPOSABLE) ×3 IMPLANT
TRAY FOLEY W/BAG SLVR 14FR (SET/KITS/TRAYS/PACK) ×2 IMPLANT
TUBE CONNECTING 12'X1/4 (SUCTIONS) ×1
TUBE CONNECTING 12X1/4 (SUCTIONS) ×2 IMPLANT
YANKAUER SUCT BULB TIP NO VENT (SUCTIONS) ×3 IMPLANT

## 2018-11-01 NOTE — Progress Notes (Addendum)
ANTICOAGULATION CONSULT NOTE - Initial Consult  Pharmacy Consult for Enoxaparin Indication: VTE prophylaxis  Allergies  Allergen Reactions  . Statins Other (See Comments)    Patient Measurements: Height: 5\' 5"  (165.1 cm) Weight: 155 lb (70.3 kg) IBW/kg (Calculated) : 57 BMI 25.79  Vital Signs: Temp: 97 F (36.1 C) (10/06 1738) Temp Source: Tympanic (10/06 1230) BP: 162/66 (10/06 1810) Pulse Rate: 74 (10/06 1810)  Labs: Recent Labs    10/31/18 1115 11/01/18 0351 11/01/18 1427  HGB 9.0* 6.9* 10.1*  HCT 29.3* 21.3* 31.8*  PLT 161 104*  --   CREATININE 1.36* 1.26*  --     Estimated Creatinine Clearance: 34.4 mL/min (A) (by C-G formula based on SCr of 1.26 mg/dL (H)). Total Body Weight CrCl (used for Enoxaparin Dosing): 39 mL/min  Medical History: Past Medical History:  Diagnosis Date  . Diabetes ([place])   . Essential tremor   . Fall 06/07/2017   rib fractures  . GERD (gastroesophageal reflux disease)   . Hypertension   . Peripheral neuropathy     Assessment: 84 yr old female admitted on 10/31/18 S/P fall resulting in R femur fracture and fracture/avulsion of lateral calcaneus. Pt is S/P intramedullary retrograde femoral nailing (right) with [employer] [place] 10.5 X 380 mm statically locked this afternoon.  Medical history includes: HTN, DM with neuropathy in bilateral lower extremities, GERD, acute on CKD, hx tobacco use, essential tremors, hyperlipidemia, anxiety/depression, anemia  Scr 1.35>1.26 today; baseline ~1.0; total body wt CrCl 39 mL/min; H/H 10.1/31.8; platelets 104 yesterday (161 on admission)   Goal of Therapy:  Prevention of VTE Monitor platelets by anticoagulation protocol: Yes   Plan:  Start Lovenox 40 mg SQ daily on 11/02/18 at 8:00 AM Monitor CBC, signs/symptoms of bleeding Monitor for signs of VTE  Gillermina Hu, PharmD, BCPS, [facility] Clinical Pharmacist 11/01/2018,6:26 PM

## 2018-11-01 NOTE — Anesthesia Preprocedure Evaluation (Signed)
Anesthesia Evaluation  Patient identified by MRN, date of birth, ID band Patient awake    Reviewed: Allergy & Precautions, NPO status , Patient's Chart, lab work & pertinent test results  Airway Mallampati: I  TM Distance: >3 FB Neck ROM: Full    Dental   Pulmonary former smoker,    Pulmonary exam normal        Cardiovascular hypertension, Pt. on medications Normal cardiovascular exam     Neuro/Psych Anxiety Depression    GI/Hepatic GERD  Medicated and Controlled,  Endo/Other  diabetes, Type 2, Oral Hypoglycemic Agents  Renal/GU      Musculoskeletal   Abdominal   Peds  Hematology   Anesthesia Other Findings   Reproductive/Obstetrics                             Anesthesia Physical Anesthesia Plan  ASA: III  Anesthesia Plan: General   Post-op Pain Management:    Induction: Intravenous  PONV Risk Score and Plan: 3 and Ondansetron, Midazolam and Treatment may vary due to age or medical condition  Airway Management Planned: Oral ETT  Additional Equipment:   Intra-op Plan:   Post-operative Plan: Extubation in OR  Informed Consent: I have reviewed the patients History and Physical, chart, labs and discussed the procedure including the risks, benefits and alternatives for the proposed anesthesia with the patient or authorized representative who has indicated his/her understanding and acceptance.       Plan Discussed with: CRNA and Surgeon  Anesthesia Plan Comments:         Anesthesia Quick Evaluation

## 2018-11-01 NOTE — Op Note (Signed)
11/01/2018  5:54 PM  PATIENT:  [name]  84 y.o. female  PRE-OPERATIVE DIAGNOSIS:  RIGHT FEMUR FRACTURE SHAFT AND SUPRACONDYLAR  POST-OPERATIVE DIAGNOSIS: RIGHT FEMUR FRACTURE SHAFT AND SUPRACONDYLAR  PROCEDURE:  Procedure(s): INTRAMEDULLARY (IM) RETROGRADE FEMORAL NAILING (Right) WITH BIOMET [place] 10.5 X 380 MM STATICALLY LOCKED  SURGEON:  Surgeon(s) and Role:    Altamese Dawson, MD - Primary  PHYSICIAN ASSISTANT: Ainsley Spinner, PA-C  ANESTHESIA:   general  I/O:  Total I/O In: [id number] [I.V.:1200; Blood:315] Out: 675 [Urine:625; Blood:50]  SPECIMEN:  No Specimen  TOURNIQUET:  * No tourniquets in log *  DICTATION: .Note written in EPIC  DISPOSITION: TO PACU, STABLE  BRIEF SUMMARY OF INDICATION FOR PROCEDURE:  Kristen Mason is a  84 y.o. who sustained right femur fracture in ground level fall when filling up the coffe maker when on her walker. Because of associated injuries and patient factors I recommended retrograde nailing of the femur to facilitate early mobilization and range of motion. The risks and benefits of this operation were discussed with the patient including the possibilities of infection, nerve injury, vessel injury, DVT/ PE, loss of motion, arthritis, symptomatic hardware, and need for further surgery among others.  After full discussion, the patient gave consent to proceed.  BRIEF SUMMARY OF PROCEDURE:  After administration of preoperative antibiotics, the patient was taken to the operating room where general anesthesia was induced.  Careful positioning was performed with a bump placed under the right hip, and control of the fracture with distraction during positioning and prepping to prevent neurovascular injury. Standard prep and drape was performed with chlorhexidine scrub and wash initially, then Betadine scrub and paint.  Time-out was held and then the radiolucent triangle and towel bumps used to gain length and sagittal alignment with traction and flexion  of the knee.  A 2.5 cm incision was made at the base of the patellar tendon, extending proximally, followed with a medial parapatellar retinacular incision.  The sharp guide pin was placed directly anterior to Blumensaat's line in the middle of the condyle and advanced on AP and lateral projections into the center-center position of the distal femur. My assistant continued traction while I fine tuned the bumps. I also applied pressure with a mallet to produce coronal plane angulation.  The starting reamer was then used distally while protecting the soft tissues.  This was followed by introduction of the ball-tipped guidewire across the fracture site into the proximal femur up close to the piriformis.  Nail length was measured. Sequential reaming followed while maintaining reduction, encountering chatter at 12 mm and reaming up to 12 mm, placing a 10.5 x 380 mm nail.  After confirming appropriate seating of the nail beyond Blumensaat's line on the lateral, locking screws were placed distally off the guide, and checked on orthogonal images to confirm placement within the nail and appropriate length. The telescoping locking device within the nail was then engaged. Two proximal locks were then placed in static mode using perfect circle technique and a captured screwdriver.  These screws were also confirmed for length and position. At the conclusion of the procedure, I examined the knee for varus and valgus stability after fixation and did not identify significant instability. Also with my assistant, Ainsley Spinner, PA-C, we then irrigated all wounds thoroughly and closed them in standard layered fashion, working simultaneously to reduce overall time in the OR. The wounds were dressed and a gently compressive wrap from the ankle to thigh was applied.  The patient  was awakened and taken to the PACU in stable condition.    PROGNOSIS:  The patient will have unrestricted range of motion of the knee and hip, and early  mobilization will be encouraged with nonweight bearing.  DVT prophylaxis will be with weight-based Lovenox. Metabolic bone workup is ongoing. Given the patients associated injuries and comorbidities the risk of complications remains elevated which has been discussed with the daughter with SNF placement likely.   Astrid Divine. Marcelino Scot, M.D.

## 2018-11-01 NOTE — Plan of Care (Signed)

## 2018-11-01 NOTE — Progress Notes (Signed)
Patient hemoglobin level 6.9 Dr. Baltazar Najjar notified. Will continue to monitor until otherwise advised.

## 2018-11-01 NOTE — Progress Notes (Signed)
[facility] Progress note  Reviewing labs/notes pre-op H/H this am 6.9/21.3, pt started at 9.0/29.3 Believe this value given fracture location and that she presented a >24 hours after injury (now >48 hours) 1 unit of PRBCs has been ordere Will order another unit and have another typed and crossed for OR  H/H after first transfusion   Still anticipate OR later today as blood loss will continue until fracture is stabilized   Jari Pigg, PA-C [phone] (C) 11/01/2018, 8:19 AM  [facility] [place] [address] [phone] Domingo Sep (F)

## 2018-11-01 NOTE — Anesthesia Procedure Notes (Signed)
Procedure Name: Intubation Date/Time: 11/01/2018 3:22 PM Performed by: Amadeo Garnet, CRNA Pre-anesthesia Checklist: Emergency Drugs available, Patient identified, Suction available, Patient being monitored and Timeout performed Patient Re-evaluated:Patient Re-evaluated prior to induction Oxygen Delivery Method: Circle system utilized Preoxygenation: Pre-oxygenation with 100% oxygen Induction Type: IV induction Ventilation: Mask ventilation without difficulty and Oral airway inserted - appropriate to patient size Laryngoscope Size: Mac and 3 Grade View: Grade I Tube type: Oral Tube size: 7.0 mm Number of attempts: 1 Airway Equipment and Method: Stylet Placement Confirmation: positive ETCO2,  ETT inserted through vocal cords under direct vision and breath sounds checked- equal and bilateral Secured at: 21 cm Tube secured with: Tape Dental Injury: Teeth and Oropharynx as per pre-operative assessment

## 2018-11-01 NOTE — Progress Notes (Signed)
Progress Note    Kristen Mason  [id number] DOB: [birth year]  DOA: 10/31/2018 PCP: Wenda Low, MD    Brief Narrative:    Medical records reviewed and are as summarized below:  Kristen Mason is an 84 y.o. female with medical history significant of hypertension, diabetes, GERD, chronic kidney disease presents to emergency department with right leg pain following fall yesterday.  Patient reports that she fell yesterday however does not wanted to come to the emergency department due to [place].  This morning her pain started getting worse. Found to have a hip fracture and plan is for surgery today once Hgb stable.     Assessment/Plan:   Principal Problem:   Femur fracture, right (HCC) Active Problems:   Controlled diabetes mellitus ([place])   Essential hypertension   Essential tremor   Acute on chronic kidney failure (HCC)   Macrocytic anemia   Right femur fracture: -Status post fall, reviewed x-ray of right knee -Monitor her vitals and H&H.  Received fentanyl for pain control in ED.  Start on morphine PRN for severe pain.  Continue IV fluids. -orthopedic surgery-recommended surgery for today   Fracture/avulsion of lateral calcaneus: -No intervention at this time  Hypertension: Blood pressure is on lower side -Continue IV fluids and monitor blood pressure closely. -We will hold amlodipine for now.  Diabetes mellitus: We will check A1c -Start patient on sliding scale insulin.  ABLA plus Macrocytic anemia: -H&H dropped from 11.9-9.0 in 1 year. -MCV is elevated.  Patient denies melena or over-the-counter use of NSAIDs- -B12 low- replete -transfuse 1 unit prior to surgery and recheck:OR later today as blood loss will continue until fracture is stabilized    Acute kidney failure: -Likely secondary to dehydration. -Continue IV fluids and monitor kidney function closely. -Avoid nephrotoxic medication.  Essential tremors: -Patient reports that she takes CBD at home  for tremors and pain -Continue primidone  GERD: Stable continue Protonix  Low TSH -check free t4  Hyperlipidemia:  Continue statin  Anxiety/depression: -Continue home meds of Celexa and Klonopin.  Low B12 -replace   Family Communication/Anticipated D/C date and plan/Code Status   DVT prophylaxis: scd Code Status: Full Code.  Family Communication: at bedside Disposition Plan: for surgery today-- PT/OT tomm then probably SNF   Medical Consultants:    ortho     Subjective:   Getting blood transfusion-- c/o being cold  Objective:    Vitals:   11/01/18 0830 11/01/18 0913 11/01/18 0917 11/01/18 0927  BP: (!) 111/54 (!) 124/45  (!) 125/52  Pulse: 70 60 77 76  Resp: 17 18  18   Temp: 98.2 F (36.8 C) 98.1 F (36.7 C)  98.3 F (36.8 C)  TempSrc: Oral Oral  Oral  SpO2: 98% 96%  93%  Weight:      Height:        Intake/Output Summary (Last 24 hours) at 11/01/2018 1055 Last data filed at 11/01/2018 [id number] Gross per 24 hour  Intake 1350.12 ml  Output --  Net 1350.12 ml   Filed Weights   10/31/18 1120  Weight: 70.3 kg    Exam: In bed, coarse tremors rrr No wheezing, no increased work of breathing +BS, soft, NT  Data Reviewed:   I have personally reviewed following labs and imaging studies:  Labs: Labs show the following:   Basic Metabolic Panel: Recent Labs  Lab 10/31/18 1115 10/31/18 1445 11/01/18 0351  NA 139  --  138  K 4.7  --  4.0  CL 104  --  106  CO2 24  --  23  GLUCOSE 155*  --  117*  BUN 22  --  24*  CREATININE 1.36*  --  1.26*  CALCIUM 8.1*  --  7.4*  MG  --  1.9  --   PHOS  --  3.7  --    GFR Estimated Creatinine Clearance: 34.4 mL/min (A) (by C-G formula based on SCr of 1.26 mg/dL (H)). Liver Function Tests: No results for input(s): AST, ALT, ALKPHOS, BILITOT, PROT, ALBUMIN in the last 168 hours. No results for input(s): LIPASE, AMYLASE in the last 168 hours. No results for input(s): AMMONIA in the last 168  hours. Coagulation profile No results for input(s): INR, PROTIME in the last 168 hours.  CBC: Recent Labs  Lab 10/31/18 1115 11/01/18 0351  WBC 8.0 4.7  NEUTROABS 6.2  --   HGB 9.0* 6.9*  HCT 29.3* 21.3*  MCV 102.8* 99.5  PLT 161 104*   Cardiac Enzymes: No results for input(s): CKTOTAL, CKMB, CKMBINDEX, TROPONINI in the last 168 hours. BNP (last 3 results) No results for input(s): PROBNP in the last 8760 hours. CBG: Recent Labs  Lab 10/31/18 1115 10/31/18 1744 10/31/18 2108 11/01/18 0002 11/01/18 0746  GLUCAP 150* 118* 150* 114* 113*   D-Dimer: No results for input(s): DDIMER in the last 72 hours. Hgb A1c: Recent Labs    10/31/18 1445  HGBA1C 6.2*   Lipid Profile: Recent Labs    10/31/18 1445  CHOL 156  HDL 38*  LDLCALC 82  TRIG 180*  CHOLHDL 4.1   Thyroid function studies: Recent Labs    10/31/18 1445  TSH 0.237*   Anemia work up: Recent Labs    10/31/18 1445  VITAMINB12 151*  FERRITIN 39  TIBC 260  IRON 35   Sepsis Labs: Recent Labs  Lab 10/31/18 1115 11/01/18 0351  WBC 8.0 4.7    Microbiology Recent Results (from the past 240 hour(s))  SARS CORONAVIRUS 2 (TAT 6-24 HRS) Nasopharyngeal Nasopharyngeal Swab     Status: None   Collection Time: 10/31/18 12:45 PM   Specimen: Nasopharyngeal Swab  Result Value Ref Range Status   SARS Coronavirus 2 NEGATIVE NEGATIVE Final    Comment: (NOTE) SARS-CoV-2 target nucleic acids are NOT DETECTED. The SARS-CoV-2 RNA is generally detectable in upper and lower respiratory specimens during the acute phase of infection. Negative results do not preclude SARS-CoV-2 infection, do not rule out co-infections with other pathogens, and should not be used as the sole basis for treatment or other patient management decisions. Negative results must be combined with clinical observations, patient history, and epidemiological information. The expected result is Negative. Fact Sheet for  Patients: SugarRoll.be Fact Sheet for Healthcare Providers: https://www.woods-mathews.com/ This test is not yet approved or cleared by the [place] FDA and  has been authorized for detection and/or diagnosis of SARS-CoV-2 by FDA under an Emergency Use Authorization (EUA). This EUA will remain  in effect (meaning this test can be used) for the duration of the COVID-19 declaration under Section 56 4(b)(1) of the Act, 21 U.S.C. section 360bbb-3(b)(1), unless the authorization is terminated or revoked sooner. Performed at [facility], [place] [address], [place], [place] [address]   Surgical pcr screen     Status: Abnormal   Collection Time: 10/31/18  5:50 PM   Specimen: Nasal Mucosa; Nasal Swab  Result Value Ref Range Status   MRSA, PCR POSITIVE (A) NEGATIVE Final    Comment: RESULT CALLED TO, READ BACK BY AND  VERIFIED WITH: L COLLIE RN 1942 10/31/18 A BROWNING    Staphylococcus aureus POSITIVE (A) NEGATIVE Final    Comment: (NOTE) The Xpert SA Assay (FDA approved for NASAL specimens in patients 79 years of age and older), is one component of a comprehensive surveillance program. It is not intended to diagnose infection nor to guide or monitor treatment. Performed at [facility], [place] [address] [place], [place] [address]     Procedures and diagnostic studies:  Dg Ankle Complete Right  Result Date: 10/31/2018 CLINICAL DATA:  Acute RIGHT ankle pain following fall. Initial encounter. EXAM: RIGHT ANKLE - COMPLETE 3+ VIEW COMPARISON:  None. FINDINGS: A tiny fracture along the LATERAL calcaneus is noted, age indeterminate. No other acute fracture, subluxation or dislocation identified. Mild LATERAL soft tissue swelling is present. IMPRESSION: Age indeterminate tiny fracture/avulsion off the LATERAL calcaneus. No other acute bony abnormality. Electronically Signed   By: Margarette Canada M.D.   On: 10/31/2018 12:57   Ct Head Wo  Contrast  Result Date: 10/31/2018 CLINICAL DATA:  84 year old female with fall and head neck injury. Initial encounter. EXAM: CT HEAD WITHOUT CONTRAST CT CERVICAL SPINE WITHOUT CONTRAST TECHNIQUE: Multidetector CT imaging of the head and cervical spine was performed following the standard protocol without intravenous contrast. Multiplanar CT image reconstructions of the cervical spine were also generated. COMPARISON:  06/07/2017 head CT FINDINGS: CT HEAD FINDINGS Brain: No evidence of acute infarction, hemorrhage, hydrocephalus, extra-axial collection or mass lesion/mass effect. Atrophy and chronic small-vessel white matter ischemic changes again noted. Vascular: Carotid atherosclerotic calcifications noted. Skull: Normal. Negative for fracture or focal lesion. Sinuses/Orbits: No acute finding. Other: None. CT CERVICAL SPINE FINDINGS Alignment: Normal. Skull base and vertebrae: No acute fracture. No primary bone lesion or focal pathologic process. Soft tissues and spinal canal: No prevertebral fluid or swelling. No visible canal hematoma. Disc levels: Multilevel degenerative disc disease/spondylosis noted, moderate from C5-C7. Moderate multilevel facet arthropathy identified. These findings contribute to mild to moderate central spinal and bony foraminal narrowing at several levels. Upper chest: No acute abnormality. Markedly enlarged thyroid gland noted, RIGHT greater than LEFT. Other: None IMPRESSION: 1. No evidence of acute intracranial abnormality. Atrophy and chronic small-vessel white matter ischemic changes. 2. No static evidence of acute injury to the cervical spine. Degenerative changes as described. 3. Thyroid goiter. Electronically Signed   By: Margarette Canada M.D.   On: 10/31/2018 13:49   Ct Cervical Spine Wo Contrast  Result Date: 10/31/2018 CLINICAL DATA:  84 year old female with fall and head neck injury. Initial encounter. EXAM: CT HEAD WITHOUT CONTRAST CT CERVICAL SPINE WITHOUT CONTRAST TECHNIQUE:  Multidetector CT imaging of the head and cervical spine was performed following the standard protocol without intravenous contrast. Multiplanar CT image reconstructions of the cervical spine were also generated. COMPARISON:  06/07/2017 head CT FINDINGS: CT HEAD FINDINGS Brain: No evidence of acute infarction, hemorrhage, hydrocephalus, extra-axial collection or mass lesion/mass effect. Atrophy and chronic small-vessel white matter ischemic changes again noted. Vascular: Carotid atherosclerotic calcifications noted. Skull: Normal. Negative for fracture or focal lesion. Sinuses/Orbits: No acute finding. Other: None. CT CERVICAL SPINE FINDINGS Alignment: Normal. Skull base and vertebrae: No acute fracture. No primary bone lesion or focal pathologic process. Soft tissues and spinal canal: No prevertebral fluid or swelling. No visible canal hematoma. Disc levels: Multilevel degenerative disc disease/spondylosis noted, moderate from C5-C7. Moderate multilevel facet arthropathy identified. These findings contribute to mild to moderate central spinal and bony foraminal narrowing at several levels. Upper chest: No acute  abnormality. Markedly enlarged thyroid gland noted, RIGHT greater than LEFT. Other: None IMPRESSION: 1. No evidence of acute intracranial abnormality. Atrophy and chronic small-vessel white matter ischemic changes. 2. No static evidence of acute injury to the cervical spine. Degenerative changes as described. 3. Thyroid goiter. Electronically Signed   By: Margarette Canada M.D.   On: 10/31/2018 13:49   Ct Knee Right Wo Contrast  Result Date: 10/31/2018 CLINICAL DATA:  Right knee pain after falling. Evaluate distal femur fracture. EXAM: CT OF THE RIGHT KNEE WITHOUT CONTRAST TECHNIQUE: Multidetector CT imaging of the right knee was performed according to the standard protocol. Multiplanar CT image reconstructions were also generated. COMPARISON:  Radiographs same day. FINDINGS: Bones/Joint/Cartilage The bones are  diffusely demineralized. There are advanced tricompartmental degenerative changes of the right knee, most advanced in the medial compartment. As seen on earlier radiographs, there is a comminuted and mildly displaced fracture of the distal femur. This fracture involves the distal metadiaphysis and demonstrates up to 9 mm of lateral displacement laterally. The fracture extends into the medial femoral epicondyle, but there is no involvement of the weight-bearing articular surface or intercondylar extension. The patella, proximal tibia and proximal fibula are intact. There is a small lipohemarthrosis, implying intra-articular extension of the distal femur fracture. Ligaments Suboptimally assessed by CT. The cruciate ligaments are grossly intact. Muscles and Tendons The extensor mechanism is intact.  No focal muscular abnormalities. Soft tissues There is a small Baker's cyst containing a loose body. Mild femoropopliteal atherosclerosis is noted. IMPRESSION: 1. Comminuted and mildly displaced intra-articular fracture of the distal femur with associated lipohemarthrosis. This fracture demonstrates no involvement of the articular surface of either femoral condyle or intercondylar extension, although does involve the medial femoral epicondyle. 2. Intact proximal tibia and fibula. 3. Advanced tricompartmental right knee osteoarthritis. Electronically Signed   By: Richardean Sale M.D.   On: 10/31/2018 17:56   Dg Knee Complete 4 Views Right  Result Date: 10/31/2018 CLINICAL DATA:  Acute RIGHT knee pain following fall. Initial encounter. EXAM: RIGHT KNEE - COMPLETE 4+ VIEW COMPARISON:  None. FINDINGS: A mixed transverse/oblique fracture of the distal RIGHT femoral metadiaphysis is noted with 0.5 cm dorsal displacement. There is no definite intra-articular fracture extension identified. Severe tricompartmental degenerative changes are noted. There may be a tiny knee effusion present. No suspicious focal bony lesions are  present. IMPRESSION: Distal RIGHT femoral fracture with 0.5 cm dorsal displacement. Severe tricompartmental degenerative changes. Electronically Signed   By: Margarette Canada M.D.   On: 10/31/2018 12:55   Dg Hip Unilat W Or Wo Pelvis 2-3 Views Right  Result Date: 10/31/2018 CLINICAL DATA:  Status post fall EXAM: DG HIP (WITH OR WITHOUT PELVIS) 2-3V RIGHT COMPARISON:  None. FINDINGS: Bones: No fracture or dislocation. Normal bone mineralization. No periostitis. Joints: Normal alignment. No erosive changes. Severe osteoarthritis of bilateral hips with subchondral cystic changes, subchondral sclerosis and marginal osteophytes. Lower lumbar spine spondylosis. Soft tissue: No soft tissue abnormality. No radiopaque foreign body. No chondrocalcinosis. IMPRESSION: Severe osteoarthritis of bilateral hips. Electronically Signed   By: Kathreen Devoid   On: 10/31/2018 12:55    Medications:    sodium chloride   Intravenous Once   sodium chloride   Intravenous Once   aspirin EC  81 mg Oral Daily   carbidopa-levodopa  1 tablet Oral TID   citalopram  40 mg Oral Daily   clonazePAM  1 mg Oral QHS   insulin aspart  0-15 Units Subcutaneous TID WC   insulin aspart  0-5 Units Subcutaneous QHS   mupirocin ointment   Nasal BID   pantoprazole  40 mg Oral Daily   primidone  250 mg Oral BID   simvastatin  10 mg Oral q1800   Continuous Infusions:  sodium chloride 100 mL/hr at 10/31/18 1605     LOS: 1 day   Geradine Girt  [facility]   How to contact the [facility] Attending or Consulting provider [place] or covering provider during after hours [place], for this patient?  1. Check the care team in [facility] and look for a) attending/consulting [facility] provider listed and b) the [facility] team listed 2. Log into [email or web address] and use [place]'s universal password to access. If you do not have the password, please contact the hospital operator. 3. Locate the [facility] provider you are looking for under [facility] and  page to a number that you can be directly reached. 4. If you still have difficulty reaching the provider, please page the [facility] (Director on Call) for the Hospitalists listed on amion for assistance.  11/01/2018, 10:55 AM

## 2018-11-01 NOTE — Transfer of Care (Signed)
Immediate Anesthesia Transfer of Care Note  Patient: Kristen Mason  Procedure(s) Performed: INTRAMEDULLARY (IM) RETROGRADE FEMORAL NAILING (Right )  Patient Location: PACU  Anesthesia Type:General  Level of Consciousness: drowsy  Airway & Oxygen Therapy: Patient Spontanous Breathing and Patient connected to face mask oxygen  Post-op Assessment: Report given to RN and Post -op Vital signs reviewed and stable  Post vital signs: Reviewed and stable  Last Vitals:  Vitals Value Taken Time  BP 101/57 11/01/18 1724  Temp    Pulse 73 11/01/18 1726  Resp 19 11/01/18 1726  SpO2 100 % 11/01/18 1726  Vitals shown include unvalidated device data.  Last Pain:  Vitals:   11/01/18 1412  TempSrc:   PainSc: 10-Worst pain ever      Patients Stated Pain Goal: 2 ([id number])  Complications: No apparent anesthesia complications

## 2018-11-01 NOTE — Progress Notes (Signed)
Patient c/o itching. Patient stated she takes benadryl at home daily for itching. Patient requested benadryl. Dr. Rhona Leavens notified and ordered one dose benadryl. Patient tolerated med well. Will continue to monitor.

## 2018-11-01 NOTE — Consult Note (Signed)
Orthopaedic Trauma Service (OTS) Consult   Patient ID: Kristen Mason MRN: [id number] DOB/AGE: [birth year] 84 y.o.   Reason for Consult: Right supracondylar distal femur fracture  Referring Physician: Early Osmond, MD (Internal medicine)  PCP: Dr. Lysle Rubens    HPI: Kristen Mason is an 84 y.o. white female with history notable for diabetes, hypertension, essential tremor along with peripheral neuropathy who sustained a fracture to her right distal femur on 10/30/2018.  Patient does use a walker at baseline.  States that she was at home pouring water into the coffee pot when she spilled the water and then slipped falling on her right leg.  Patient initially thought that she could handle it at home but the pain became too severe and she presented to the emergency department on 10/31/2018.  Work-up in the emergency department demonstrated a comminuted right supracondylar distal femur fracture.  Orthopedics was consulted for further management.  Patient admitted to the medical service as well.  Currently patient reports severe pain in her right leg.  She does report baseline bilateral knee pain for many years.  She also reports degenerative disc in her low back and has chronic low back pain as well.  She denies any additional injuries elsewhere  She does have pretty significant peripheral neuropathy and has diminished sensation all the way up to the junction of the mid proximal third lower leg Patient does have a history of being on insulin in the past for diabetes however his dietary changes were mainly she no longer requires insulin and is well controlled oral medication.  States that her A1c is usually range in the 6 to 7%  Patient lives with her daughter Again she is walker dependent  She does want to maintain her activity level.  She is not opposed to seeking evaluation for total joint arthroplasties in the future.  She does have history of balance issues and falls.  This is likely  multifactorial related to her peripheral neuropathy and also her severe bilateral hip and knee DJD.  She has been in [place] for 4 years, has been living with her daughter for 12 years.  They moved here from [place] as her daughter works for [employer]   Patient has had a bone density scan but it has been many years.  She does believe that it indicated she had osteoporosis but was never placed on any medications.  She is not currently on vitamin D or calcium.  She has taken them in the past.  Lab work-up this morning noted hemoglobin of 6.9.  As of now she has received 2 units of PRBCs and there is 3rd unit that has been typed and crossed and held  Past Medical History:  Diagnosis Date   Diabetes [facility])    Essential tremor    Fall 06/07/2017   rib fractures   GERD (gastroesophageal reflux disease)    Hypertension    Peripheral neuropathy     Past Surgical History:  Procedure Laterality Date   ABDOMINAL HYSTERECTOMY  1975   BREAST LUMPECTOMY Right    non cancerous    No family history on file.  Social History:  reports that she has quit smoking. Her smoking use included cigarettes. She has quit using smokeless tobacco. She reports that she does not drink alcohol or use drugs.  Allergies:  Allergies  Allergen Reactions   Statins Other (See Comments)    Medications: I have reviewed the patient's current medications. Current Meds  Medication  Sig   amLODipine (NORVASC) 5 MG tablet Take 5 mg by mouth daily.   aspirin 81 MG tablet Take 81 mg by mouth daily.   carbidopa-levodopa (SINEMET IR) 25-100 MG tablet Take 1 tablet by mouth 3 (three) times daily.   cetirizine (ZYRTEC) 10 MG tablet Take 10 mg by mouth daily.   citalopram (CELEXA) 40 MG tablet Take 40 mg by mouth daily.   clonazePAM (KLONOPIN) 1 MG tablet Take 1 tablet (1 mg total) by mouth at bedtime.   dicyclomine (BENTYL) 10 MG capsule Take 10 mg by mouth 4 (four) times daily - after meals and at bedtime.     gabapentin (NEURONTIN) 800 MG tablet Take 800 mg by mouth 3 (three) times daily.   glimepiride (AMARYL) 1 MG tablet Take 1 mg by mouth daily.   hydrOXYzine (ATARAX/VISTARIL) 25 MG tablet Take 25 mg by mouth at bedtime.   lisinopril (PRINIVIL,ZESTRIL) 2.5 MG tablet Take 2.5 mg by mouth 2 (two) times daily. take 1 tab po daily for htn    Omega-3 Fatty Acids (FISH OIL) 1000 MG CPDR Take 1,000 mg by mouth daily.   pantoprazole (PROTONIX) 40 MG tablet Take 40 mg by mouth daily.   primidone (MYSOLINE) 250 MG tablet Take 250 mg by mouth 2 (two) times daily.   simvastatin (ZOCOR) 10 MG tablet Take 10 mg by mouth daily.   traMADol (ULTRAM) 50 MG tablet Take 1 tablet (50 mg total) by mouth every 6 (six) hours as needed. (Patient taking differently: Take 50 mg by mouth every 6 (six) hours as needed for moderate pain. )     Results for orders placed or performed during the hospital encounter of 10/31/18 (from the past 48 hour(s))  CBG monitoring, ED     Status: Abnormal   Collection Time: 10/31/18 11:15 AM  Result Value Ref Range   Glucose-Capillary 150 (H) 70 - 99 mg/dL  Basic metabolic panel     Status: Abnormal   Collection Time: 10/31/18 11:15 AM  Result Value Ref Range   Sodium 139 135 - 145 mmol/L   Potassium 4.7 3.5 - 5.1 mmol/L   Chloride 104 98 - 111 mmol/L   CO2 24 22 - 32 mmol/L   Glucose, Bld 155 (H) 70 - 99 mg/dL   BUN 22 8 - 23 mg/dL   Creatinine, Ser 1.36 (H) 0.44 - 1.00 mg/dL   Calcium 8.1 (L) 8.9 - 10.3 mg/dL   GFR calc non Af Amer 36 (L) >60 mL/min   GFR calc Af Amer 42 (L) >60 mL/min   Anion gap 11 5 - 15    Comment: Performed at [facility], [address], [place], [place] [address]  CBC with Differential     Status: Abnormal   Collection Time: 10/31/18 11:15 AM  Result Value Ref Range   WBC 8.0 4.0 - 10.5 K/uL   RBC 2.85 (L) 3.87 - 5.11 MIL/uL   Hemoglobin 9.0 (L) 12.0 - 15.0 g/dL   HCT 29.3 (L) 36.0 - 46.0 %   MCV 102.8 (H) 80.0 - 100.0 fL   MCH  31.6 26.0 - 34.0 pg   MCHC 30.7 30.0 - 36.0 g/dL   RDW 15.9 (H) 11.5 - 15.5 %   Platelets 161 150 - 400 K/uL   nRBC 0.0 0.0 - 0.2 %   Neutrophils Relative % 78 %   Neutro Abs 6.2 1.7 - 7.7 K/uL   Lymphocytes Relative 10 %   Lymphs Abs 0.8 0.7 -  4.0 K/uL   Monocytes Relative 11 %   Monocytes Absolute 0.9 0.1 - 1.0 K/uL   Eosinophils Relative 0 %   Eosinophils Absolute 0.0 0.0 - 0.5 K/uL   Basophils Relative 0 %   Basophils Absolute 0.0 0.0 - 0.1 K/uL   Immature Granulocytes 1 %   Abs Immature Granulocytes 0.04 0.00 - 0.07 K/uL    Comment: Performed at [facility], [place] [address] [place], [place] [address]  SARS CORONAVIRUS 2 (TAT 6-24 HRS) Nasopharyngeal Nasopharyngeal Swab     Status: None   Collection Time: 10/31/18 12:45 PM   Specimen: Nasopharyngeal Swab  Result Value Ref Range   SARS Coronavirus 2 NEGATIVE NEGATIVE    Comment: (NOTE) SARS-CoV-2 target nucleic acids are NOT DETECTED. The SARS-CoV-2 RNA is generally detectable in upper and lower respiratory specimens during the acute phase of infection. Negative results do not preclude SARS-CoV-2 infection, do not rule out co-infections with other pathogens, and should not be used as the sole basis for treatment or other patient management decisions. Negative results must be combined with clinical observations, patient history, and epidemiological information. The expected result is Negative. Fact Sheet for Patients: SugarRoll.be Fact Sheet for Healthcare Providers: https://www.woods-mathews.com/ This test is not yet approved or cleared by the [place] FDA and  has been authorized for detection and/or diagnosis of SARS-CoV-2 by FDA under an Emergency Use Authorization (EUA). This EUA will remain  in effect (meaning this test can be used) for the duration of the COVID-19 declaration under Section 56 4(b)(1) of the Act, 21 U.S.C. section 360bbb-3(b)(1), unless the  authorization is terminated or revoked sooner. Performed at [facility], [place] [address] [place], [place] [address]   Type and screen [place]     Status: None (Preliminary result)   Collection Time: 10/31/18  2:45 PM  Result Value Ref Range   ABO/RH(D) O NEG    Antibody Screen NEG    Sample Expiration 11/03/2018,2359    Unit Number [id number]    Blood Component Type RBC LR PHER2    Unit division 00    Status of Unit ISSUED    Transfusion Status OK TO TRANSFUSE    Crossmatch Result Compatible    Unit Number [id number]    Blood Component Type RED CELLS,LR    Unit division 00    Status of Unit ISSUED    Transfusion Status OK TO TRANSFUSE    Crossmatch Result COMPATIBLE   Magnesium     Status: None   Collection Time: 10/31/18  2:45 PM  Result Value Ref Range   Magnesium 1.9 1.7 - 2.4 mg/dL    Comment: Performed at [facility], [place] [address] [place], [place] [address]  Phosphorus     Status: None   Collection Time: 10/31/18  2:45 PM  Result Value Ref Range   Phosphorus 3.7 2.5 - 4.6 mg/dL    Comment: Performed at [facility], [place] [address] [place], [place] [address]  Hemoglobin A1c     Status: Abnormal   Collection Time: 10/31/18  2:45 PM  Result Value Ref Range   Hgb A1c MFr Bld 6.2 (H) 4.8 - 5.6 %    Comment: (NOTE) Pre diabetes:          5.7%-6.4% Diabetes:              >6.4% Glycemic control for   <7.0% adults with diabetes    Mean Plasma Glucose 131.24  mg/dL    Comment: Performed at [facility], [place] [address] [place], [place] [address]  Ferritin     Status: None   Collection Time: 10/31/18  2:45 PM  Result Value Ref Range   Ferritin 39 11 - 307 ng/mL    Comment: Performed at [facility], [place] [address], [place], [place] [address]  Iron and TIBC     Status: None   Collection Time: 10/31/18  2:45 PM  Result Value Ref Range   Iron 35 28 - 170 ug/dL   TIBC 260 250 - 450 ug/dL   Saturation Ratios 13  10.4 - 31.8 %   UIBC 225 ug/dL    Comment: Performed at [facility], [place] [address] [place], [place] [address]  Vitamin B12     Status: Abnormal   Collection Time: 10/31/18  2:45 PM  Result Value Ref Range   Vitamin B-12 151 (L) 180 - 914 pg/mL    Comment: (NOTE) This assay is not validated for testing neonatal or myeloproliferative syndrome specimens for Vitamin B12 levels. Performed at [facility], [place] [address] [place], [place] [address]   Lipid panel     Status: Abnormal   Collection Time: 10/31/18  2:45 PM  Result Value Ref Range   Cholesterol 156 0 - 200 mg/dL   Triglycerides 180 (H) <150 mg/dL   HDL 38 (L) >40 mg/dL   Total CHOL/HDL Ratio 4.1 RATIO   VLDL 36 0 - 40 mg/dL   LDL Cholesterol 82 0 - 99 mg/dL    Comment:        Total Cholesterol/HDL:CHD Risk Coronary Heart Disease Risk Table                     Men   Women  1/2 Average Risk   3.4   3.3  Average Risk       5.0   4.4  2 X Average Risk   9.6   7.1  3 X Average Risk  23.4   11.0        Use the calculated Patient Ratio above and the CHD Risk Table to determine the patient's CHD Risk.        ATP III CLASSIFICATION (LDL):  <100     mg/dL   Optimal  100-129  mg/dL   Near or Above                    Optimal  130-159  mg/dL   Borderline  160-189  mg/dL   High  >190     mg/dL   Very High Performed at [place] [address] [place], [place] [address]   TSH     Status: Abnormal   Collection Time: 10/31/18  2:45 PM  Result Value Ref Range   TSH 0.237 (L) 0.350 - 4.500 uIU/mL    Comment: Performed by a 3rd Generation assay with a functional sensitivity of <=0.01 uIU/mL. Performed at [facility], [place] [address], [place], [place] [address]   ABO/Rh     Status: None   Collection Time: 10/31/18  2:45 PM  Result Value Ref Range   ABO/RH(D)      O NEG Performed at [place] [address] [place], [place] [address]   Glucose, capillary     Status: Abnormal    Collection Time: 10/31/18  5:44 PM  Result Value Ref Range  Glucose-Capillary 118 (H) 70 - 99 mg/dL  Surgical pcr screen     Status: Abnormal   Collection Time: 10/31/18  5:50 PM   Specimen: Nasal Mucosa; Nasal Swab  Result Value Ref Range   MRSA, PCR POSITIVE (A) NEGATIVE    Comment: RESULT CALLED TO, READ BACK BY AND VERIFIED WITH: L COLLIE RN 1942 10/31/18 A BROWNING    Staphylococcus aureus POSITIVE (A) NEGATIVE    Comment: (NOTE) The Xpert SA Assay (FDA approved for NASAL specimens in patients 58 years of age and older), is one component of a comprehensive surveillance program. It is not intended to diagnose infection nor to guide or monitor treatment. Performed at [facility], [place] [address], [place], [place] [address]   Glucose, capillary     Status: Abnormal   Collection Time: 10/31/18  9:08 PM  Result Value Ref Range   Glucose-Capillary 150 (H) 70 - 99 mg/dL  Glucose, capillary     Status: Abnormal   Collection Time: 11/01/18 12:02 AM  Result Value Ref Range   Glucose-Capillary 114 (H) 70 - 99 mg/dL  Basic metabolic panel     Status: Abnormal   Collection Time: 11/01/18  3:51 AM  Result Value Ref Range   Sodium 138 135 - 145 mmol/L   Potassium 4.0 3.5 - 5.1 mmol/L   Chloride 106 98 - 111 mmol/L   CO2 23 22 - 32 mmol/L   Glucose, Bld 117 (H) 70 - 99 mg/dL   BUN 24 (H) 8 - 23 mg/dL   Creatinine, Ser 1.26 (H) 0.44 - 1.00 mg/dL   Calcium 7.4 (L) 8.9 - 10.3 mg/dL   GFR calc non Af Amer 40 (L) >60 mL/min   GFR calc Af Amer 46 (L) >60 mL/min   Anion gap 9 5 - 15    Comment: Performed at [facility], [place] [address], [place], [place] [address]  CBC     Status: Abnormal   Collection Time: 11/01/18  3:51 AM  Result Value Ref Range   WBC 4.7 4.0 - 10.5 K/uL   RBC 2.14 (L) 3.87 - 5.11 MIL/uL   Hemoglobin 6.9 (LL) 12.0 - 15.0 g/dL    Comment: CRITICAL RESULT CALLED TO, READ BACK BY AND VERIFIED WITH: RN ETENBILE JOYCE AT 0445 ON 11/01/2018 BY MESSAN  HOUEGNIFIO REPEATED TO VERIFY    HCT 21.3 (L) 36.0 - 46.0 %   MCV 99.5 80.0 - 100.0 fL   MCH 32.2 26.0 - 34.0 pg   MCHC 32.4 30.0 - 36.0 g/dL   RDW 15.9 (H) 11.5 - 15.5 %   Platelets 104 (L) 150 - 400 K/uL    Comment: REPEATED TO VERIFY PLATELET COUNT CONFIRMED BY SMEAR SPECIMEN CHECKED FOR CLOTS Immature Platelet Fraction may be clinically indicated, consider ordering this additional test [id number]    nRBC 0.0 0.0 - 0.2 %    Comment: Performed at [facility], [place] [address] [place], [place] [address]  Glucose, capillary     Status: Abnormal   Collection Time: 11/01/18  4:05 AM  Result Value Ref Range   Glucose-Capillary 113 (H) 70 - 99 mg/dL  Prepare RBC     Status: None   Collection Time: 11/01/18  5:26 AM  Result Value Ref Range   Order Confirmation      ORDER PROCESSED BY BLOOD BANK Performed at [facility], [place] [address], [place], [place] [address]   Glucose, capillary     Status:  Abnormal   Collection Time: 11/01/18  7:46 AM  Result Value Ref Range   Glucose-Capillary 113 (H) 70 - 99 mg/dL  Prepare RBC     Status: None   Collection Time: 11/01/18  8:21 AM  Result Value Ref Range   Order Confirmation      ORDER PROCESSED BY BLOOD BANK Performed at [facility], [place] [address] [place], [place] [address]   Glucose, capillary     Status: Abnormal   Collection Time: 11/01/18 11:27 AM  Result Value Ref Range   Glucose-Capillary 110 (H) 70 - 99 mg/dL  Glucose, capillary     Status: Abnormal   Collection Time: 11/01/18  1:36 PM  Result Value Ref Range   Glucose-Capillary 102 (H) 70 - 99 mg/dL    Dg Ankle Complete Right  Result Date: 10/31/2018 CLINICAL DATA:  Acute RIGHT ankle pain following fall. Initial encounter. EXAM: RIGHT ANKLE - COMPLETE 3+ VIEW COMPARISON:  None. FINDINGS: A tiny fracture along the LATERAL calcaneus is noted, age indeterminate. No other acute fracture, subluxation or dislocation identified. Mild LATERAL soft tissue  swelling is present. IMPRESSION: Age indeterminate tiny fracture/avulsion off the LATERAL calcaneus. No other acute bony abnormality. Electronically Signed   By: Margarette Canada M.D.   On: 10/31/2018 12:57   Ct Head Wo Contrast  Result Date: 10/31/2018 CLINICAL DATA:  84 year old female with fall and head neck injury. Initial encounter. EXAM: CT HEAD WITHOUT CONTRAST CT CERVICAL SPINE WITHOUT CONTRAST TECHNIQUE: Multidetector CT imaging of the head and cervical spine was performed following the standard protocol without intravenous contrast. Multiplanar CT image reconstructions of the cervical spine were also generated. COMPARISON:  06/07/2017 head CT FINDINGS: CT HEAD FINDINGS Brain: No evidence of acute infarction, hemorrhage, hydrocephalus, extra-axial collection or mass lesion/mass effect. Atrophy and chronic small-vessel white matter ischemic changes again noted. Vascular: Carotid atherosclerotic calcifications noted. Skull: Normal. Negative for fracture or focal lesion. Sinuses/Orbits: No acute finding. Other: None. CT CERVICAL SPINE FINDINGS Alignment: Normal. Skull base and vertebrae: No acute fracture. No primary bone lesion or focal pathologic process. Soft tissues and spinal canal: No prevertebral fluid or swelling. No visible canal hematoma. Disc levels: Multilevel degenerative disc disease/spondylosis noted, moderate from C5-C7. Moderate multilevel facet arthropathy identified. These findings contribute to mild to moderate central spinal and bony foraminal narrowing at several levels. Upper chest: No acute abnormality. Markedly enlarged thyroid gland noted, RIGHT greater than LEFT. Other: None IMPRESSION: 1. No evidence of acute intracranial abnormality. Atrophy and chronic small-vessel white matter ischemic changes. 2. No static evidence of acute injury to the cervical spine. Degenerative changes as described. 3. Thyroid goiter. Electronically Signed   By: Margarette Canada M.D.   On: 10/31/2018 13:49   Ct  Cervical Spine Wo Contrast  Result Date: 10/31/2018 CLINICAL DATA:  84 year old female with fall and head neck injury. Initial encounter. EXAM: CT HEAD WITHOUT CONTRAST CT CERVICAL SPINE WITHOUT CONTRAST TECHNIQUE: Multidetector CT imaging of the head and cervical spine was performed following the standard protocol without intravenous contrast. Multiplanar CT image reconstructions of the cervical spine were also generated. COMPARISON:  06/07/2017 head CT FINDINGS: CT HEAD FINDINGS Brain: No evidence of acute infarction, hemorrhage, hydrocephalus, extra-axial collection or mass lesion/mass effect. Atrophy and chronic small-vessel white matter ischemic changes again noted. Vascular: Carotid atherosclerotic calcifications noted. Skull: Normal. Negative for fracture or focal lesion. Sinuses/Orbits: No acute finding. Other: None. CT CERVICAL SPINE FINDINGS Alignment: Normal. Skull base and vertebrae: No acute fracture. No primary bone  lesion or focal pathologic process. Soft tissues and spinal canal: No prevertebral fluid or swelling. No visible canal hematoma. Disc levels: Multilevel degenerative disc disease/spondylosis noted, moderate from C5-C7. Moderate multilevel facet arthropathy identified. These findings contribute to mild to moderate central spinal and bony foraminal narrowing at several levels. Upper chest: No acute abnormality. Markedly enlarged thyroid gland noted, RIGHT greater than LEFT. Other: None IMPRESSION: 1. No evidence of acute intracranial abnormality. Atrophy and chronic small-vessel white matter ischemic changes. 2. No static evidence of acute injury to the cervical spine. Degenerative changes as described. 3. Thyroid goiter. Electronically Signed   By: Margarette Canada M.D.   On: 10/31/2018 13:49   Ct Knee Right Wo Contrast  Result Date: 10/31/2018 CLINICAL DATA:  Right knee pain after falling. Evaluate distal femur fracture. EXAM: CT OF THE RIGHT KNEE WITHOUT CONTRAST TECHNIQUE: Multidetector  CT imaging of the right knee was performed according to the standard protocol. Multiplanar CT image reconstructions were also generated. COMPARISON:  Radiographs same day. FINDINGS: Bones/Joint/Cartilage The bones are diffusely demineralized. There are advanced tricompartmental degenerative changes of the right knee, most advanced in the medial compartment. As seen on earlier radiographs, there is a comminuted and mildly displaced fracture of the distal femur. This fracture involves the distal metadiaphysis and demonstrates up to 9 mm of lateral displacement laterally. The fracture extends into the medial femoral epicondyle, but there is no involvement of the weight-bearing articular surface or intercondylar extension. The patella, proximal tibia and proximal fibula are intact. There is a small lipohemarthrosis, implying intra-articular extension of the distal femur fracture. Ligaments Suboptimally assessed by CT. The cruciate ligaments are grossly intact. Muscles and Tendons The extensor mechanism is intact.  No focal muscular abnormalities. Soft tissues There is a small Baker's cyst containing a loose body. Mild femoropopliteal atherosclerosis is noted. IMPRESSION: 1. Comminuted and mildly displaced intra-articular fracture of the distal femur with associated lipohemarthrosis. This fracture demonstrates no involvement of the articular surface of either femoral condyle or intercondylar extension, although does involve the medial femoral epicondyle. 2. Intact proximal tibia and fibula. 3. Advanced tricompartmental right knee osteoarthritis. Electronically Signed   By: Richardean Sale M.D.   On: 10/31/2018 17:56   Dg Knee Complete 4 Views Right  Result Date: 10/31/2018 CLINICAL DATA:  Acute RIGHT knee pain following fall. Initial encounter. EXAM: RIGHT KNEE - COMPLETE 4+ VIEW COMPARISON:  None. FINDINGS: A mixed transverse/oblique fracture of the distal RIGHT femoral metadiaphysis is noted with 0.5 cm dorsal  displacement. There is no definite intra-articular fracture extension identified. Severe tricompartmental degenerative changes are noted. There may be a tiny knee effusion present. No suspicious focal bony lesions are present. IMPRESSION: Distal RIGHT femoral fracture with 0.5 cm dorsal displacement. Severe tricompartmental degenerative changes. Electronically Signed   By: Margarette Canada M.D.   On: 10/31/2018 12:55   Dg Hip Unilat W Or Wo Pelvis 2-3 Views Right  Result Date: 10/31/2018 CLINICAL DATA:  Status post fall EXAM: DG HIP (WITH OR WITHOUT PELVIS) 2-3V RIGHT COMPARISON:  None. FINDINGS: Bones: No fracture or dislocation. Normal bone mineralization. No periostitis. Joints: Normal alignment. No erosive changes. Severe osteoarthritis of bilateral hips with subchondral cystic changes, subchondral sclerosis and marginal osteophytes. Lower lumbar spine spondylosis. Soft tissue: No soft tissue abnormality. No radiopaque foreign body. No chondrocalcinosis. IMPRESSION: Severe osteoarthritis of bilateral hips. Electronically Signed   By: Kathreen Devoid   On: 10/31/2018 12:55    Review of Systems  Constitutional: Negative for chills and fever.  Respiratory: Negative for shortness of breath and wheezing.   Cardiovascular: Negative for chest pain and palpitations.  Gastrointestinal: Negative for nausea and vomiting.  Musculoskeletal:       R leg pain   Neurological: Positive for tingling (baseline B lower extremity peripheral neuropathy).   Blood pressure (!) 141/69, pulse 60, temperature 98.8 F (37.1 C), temperature source Tympanic, resp. rate 18, height 5\' 5"  (1.651 m), weight 70.3 kg, SpO2 95 %. Physical Exam Vitals signs and nursing note reviewed.  Constitutional:      General: She is awake.     Comments: Elderly appearing white female, very pleasant Essential tremors noted  purewick malfunctioning- sheets are wet with urine  HENT:     Mouth/Throat:     Mouth: Mucous membranes are dry.  Eyes:      Extraocular Movements: Extraocular movements intact.  Cardiovascular:     Rate and Rhythm: Normal rate and regular rhythm.  Pulmonary:     Effort: No respiratory distress.  Abdominal:     Comments: Nontender   Musculoskeletal:     Comments: Right Lower Extremity  No open wounds or lesions to R leg Mild swelling to knee and distal thigh  + TTP R distal thigh  Did not manipulate R knee or range R knee due to known fracture  Hip without acute findings  Lower leg, ankle and foot are nontender Arthritic changes to foot noted Ext warm  + DP and PT pulses, symmetric to contralateral side  Sensation diminished throughout all distributions, symmetric to contra-lateral side Normal sensation noted to right lower leg at about the junctions of the proximal-middle 1/3's Toe flexion and extension intact  Ankle flexion, extension, inversion and eversion intact No DCT  Compartments are soft   Left lower extremity              no open wounds or lesions, no swelling or ecchymosis   Nontender hip, knee, ankle and foot             No crepitus or gross motion noted with manipulation of the Left leg  No knee or ankle effusion             No pain with axial loading or logrolling of the hip  Knee stable to varus/ valgus and anterior/posterior stress             No pain with manipulation of the ankle or foot             No blocks to motion noted  Sens DPN, SPN, TN intact  Motor EHL, FHL, lesser toe motor, Ext, flex, evers 5/5  DP 2+, PT 2+, No significant edema             Compartments are soft and nontender, no pain with passive stretching      Neurological:     Mental Status: She is alert and oriented to person, place, and time.     Comments: Unable to assess coordination or gait   Psychiatric:        Mood and Affect: Mood and affect normal.        Behavior: Behavior is cooperative.       Assessment/Plan:  84 y/o female s/p ground level fall with R supracondylar distal femur  fracture   -fall  - closed R supracondylar distal femur fracture   Patient is ambulatory and her fracture is unstable.  Would strongly advocate for surgical correction of her fracture.  We did discuss multiple treatment  modalities.  We also discussed as to whether or not patient would consider total knee arthroplasty down the road.  She does acknowledge that she does have baseline knee pain bilaterally and this has been for several years.  She states that she would not consider getting a total knee replacement.  Without we feel that minimizing her anesthesia exposure would be most advantageous.   If we were to consider plate osteosynthesis she would require hardware removal before she can undergo total knee arthroplasty.   If we proceed with retrograde intramedullary nailing hardware can be removed at the time of her total knee arthroplasty.   Patient and daughter wish to proceed with retrograde intramedullary nailing of her distal femur fracture    We would likely be able to get better fixation utilizing plate and screw construct however we do feel that there is enough bone present based on current imaging to utilize retrograde intramedullary nail, which will also create a fixed angle construct.  However we discover that there is not adequate fixation intraoperatively we will then revert to plate osteosynthesis.    Will also attempt to get a low intensity pulsed ultrasound bone growth stimulator device to serve as an adjuvant for bone healing.   We will likely allow her to be touchdown weightbearing postoperatively with unrestricted range of motion postop.  PT and OT evaluations postoperatively   CIR vs home with home health will likely be the eventual dispo   - Pain management:  Titrate post op    Schedule tylenol    Appears to be on ultram PTA for chronic back pain    Adjust as needed     May require narcotics for breakthrough pain   - ABL anemia/Hemodynamics  2 units transfused this  am   3rd unit crossed and held in preparation for OR   H/H ordered but not done yet   Will check in pre-op holding      Fracture fixation will be beneficial to controlling blood loss  - Medical issues   Per medical team    Diabetes    On SSI   - DVT/PE prophylaxis:  Lovenox post op   - ID:   periop abx   - Metabolic Bone Disease:  Will check labs  Will need DEXA in 4-8 weeks  Fracture indicative of osteoporosis    Will likely need pharmacologic treatment of her osteoporosis    Would strongly favor starting treatment of her osteoporosis before considering Total joint replacement   - Activity:  NWB R leg   - FEN/GI prophylaxis/Foley/Lines:  NPO   - Impediments to fracture healing:  Osteoporosis  DM  Falls/balance issues  Psychotropic medication   - Dispo:  OR today if h/h improved     Jari Pigg, PA-C [phone] (C) 11/01/2018, 1:50 PM  [facility] [place] [id number] [phone] Domingo Sep (F)

## 2018-11-02 ENCOUNTER — Inpatient Hospital Stay (HOSPITAL_COMMUNITY): Payer: Medicare Other

## 2018-11-02 ENCOUNTER — Encounter (HOSPITAL_COMMUNITY): Payer: Self-pay | Admitting: Orthopedic Surgery

## 2018-11-02 DIAGNOSIS — S72401A Unspecified fracture of lower end of right femur, initial encounter for closed fracture: Secondary | ICD-10-CM

## 2018-11-02 DIAGNOSIS — W19XXXA Unspecified fall, initial encounter: Secondary | ICD-10-CM

## 2018-11-02 LAB — BASIC METABOLIC PANEL
Anion gap: 14 (ref 5–15)
BUN: 20 mg/dL (ref 8–23)
CO2: 21 mmol/L — ABNORMAL LOW (ref 22–32)
Calcium: 7.6 mg/dL — ABNORMAL LOW (ref 8.9–10.3)
Chloride: 102 mmol/L (ref 98–111)
Creatinine, Ser: 1.07 mg/dL — ABNORMAL HIGH (ref 0.44–1.00)
GFR calc Af Amer: 56 mL/min — ABNORMAL LOW (ref >60)
GFR calc non Af Amer: 49 mL/min — ABNORMAL LOW (ref >60)
Glucose, Bld: 118 mg/dL — ABNORMAL HIGH (ref 70–99)
Potassium: 4.2 mmol/L (ref 3.5–5.1)
Sodium: 137 mmol/L (ref 135–145)

## 2018-11-02 LAB — URINALYSIS, ROUTINE W REFLEX MICROSCOPIC
Bilirubin Urine: NEGATIVE
Glucose, UA: NEGATIVE mg/dL
Ketones, ur: 80 mg/dL — AB
Nitrite: NEGATIVE
Protein, ur: 30 mg/dL — AB
Specific Gravity, Urine: 1.017 (ref 1.005–1.030)
pH: 5 (ref 5.0–8.0)

## 2018-11-02 LAB — VITAMIN D 25 HYDROXY (VIT D DEFICIENCY, FRACTURES): Vit D, 25-Hydroxy: 7.78 ng/mL — ABNORMAL LOW (ref 30–100)

## 2018-11-02 LAB — MAGNESIUM: Magnesium: 1.7 mg/dL (ref 1.7–2.4)

## 2018-11-02 LAB — CBC
HCT: 27.5 % — ABNORMAL LOW (ref 36.0–46.0)
Hemoglobin: 8.9 g/dL — ABNORMAL LOW (ref 12.0–15.0)
MCH: 31.2 pg (ref 26.0–34.0)
MCHC: 32.4 g/dL (ref 30.0–36.0)
MCV: 96.5 fL (ref 80.0–100.0)
Platelets: 109 10*3/uL — ABNORMAL LOW (ref 150–400)
RBC: 2.85 MIL/uL — ABNORMAL LOW (ref 3.87–5.11)
RDW: 16.7 % — ABNORMAL HIGH (ref 11.5–15.5)
WBC: 5.9 10*3/uL (ref 4.0–10.5)
nRBC: 0 % (ref 0.0–0.2)

## 2018-11-02 LAB — TYPE AND SCREEN
ABO/RH(D): O NEG
Antibody Screen: NEGATIVE
Unit division: 0
Unit division: 0

## 2018-11-02 LAB — GLUCOSE, CAPILLARY
Glucose-Capillary: 112 mg/dL — ABNORMAL HIGH (ref 70–99)
Glucose-Capillary: 121 mg/dL — ABNORMAL HIGH (ref 70–99)
Glucose-Capillary: 146 mg/dL — ABNORMAL HIGH (ref 70–99)
Glucose-Capillary: 133 mg/dL — ABNORMAL HIGH (ref 70–99)

## 2018-11-02 LAB — BPAM RBC
Blood Product Expiration Date: 202010182359
Blood Product Expiration Date: 202011062359
ISSUE DATE / TIME: 202010060613
ISSUE DATE / TIME: 202010060906
Unit Type and Rh: 5100
Unit Type and Rh: 9500

## 2018-11-02 LAB — PHOSPHORUS: Phosphorus: 2.9 mg/dL (ref 2.5–4.6)

## 2018-11-02 LAB — T4, FREE: Free T4: 1 ng/dL (ref 0.61–1.12)

## 2018-11-02 LAB — PREALBUMIN: Prealbumin: 11.4 mg/dL — ABNORMAL LOW (ref 18–38)

## 2018-11-02 MED ORDER — ONDANSETRON HCL 4 MG/2ML IJ SOLN: 2.0000 mg | Freq: Once | INTRAMUSCULAR | Status: AC

## 2018-11-02 MED ORDER — ENSURE ENLIVE PO LIQD
237.0000 mL | Freq: Three times a day (TID) | ORAL | Status: DC
  Administered 2018-11-02 – 2018-11-07 (×6): 237 mL via ORAL

## 2018-11-02 MED ORDER — INFLUENZA VAC A&B SA ADJ QUAD 0.5 ML IM PRSY
0.5000 mL | PREFILLED_SYRINGE | INTRAMUSCULAR | Status: AC
  Administered 2018-11-10: 12:00:00 0.5 mL via INTRAMUSCULAR
  Filled 2018-11-02 (×2): qty 0.5

## 2018-11-02 MED ORDER — POLYETHYLENE GLYCOL 3350 17 G PO PACK
17.0000 g | PACK | Freq: Two times a day (BID) | ORAL | Status: AC
  Administered 2018-11-02 – 2018-11-03 (×3): 17 g via ORAL
  Filled 2018-11-02 (×3): qty 1

## 2018-11-02 NOTE — Progress Notes (Signed)
Initial Nutrition Assessment  DOCUMENTATION CODES:   Not applicable  INTERVENTION:  Provide Ensure Enlive po TID, each supplement provides 350 kcal and 20 grams of protein.  Encourage adequate PO intake.   NUTRITION DIAGNOSIS:   Increased nutrient needs related to post-op healing as evidenced by estimated needs.  GOAL:   Patient will meet greater than or equal to 90% of their needs  MONITOR:   PO intake, Supplement acceptance, Skin, Weight trends, Labs, I & O's  REASON FOR ASSESSMENT:   Consult Assessment of nutrition requirement/status  ASSESSMENT:   84 y.o. female past medical history significant for hypertension diabetes chronic kidney disease presents to the ED with right neck pain after fall.   PROCEDURE (10/6): INTRAMEDULLARY (IM) RETROGRADE FEMORAL NAILING (Right)  Pt unavailable during attempted time of visit. RD unable to obtain pt nutrition history at this time. Meal completion has been 25%. RD to order nutritional supplements to aid in caloric and protein needs.   Unable to complete Nutrition-Focused physical exam at this time.   Labs and medications reviewed.   Diet Order:   Diet Order            Diet Carb Modified Fluid consistency: Thin; Room service appropriate? Yes  Diet effective now              EDUCATION NEEDS:   Not appropriate for education at this time  Skin:  Skin Assessment: Skin Integrity Issues: Skin Integrity Issues:: Incisions Incisions: R knee  Last BM:  Unknown  Height:   Ht Readings from Last 1 Encounters:  11/01/18 5\' 5"  (1.651 m)    Weight:   Wt Readings from Last 1 Encounters:  11/01/18 70.3 kg    Ideal Body Weight:  56.8 kg  BMI:  Body mass index is 25.79 kg/m.  Estimated Nutritional Needs:   Kcal:  1700-1850  Protein:  75-90 grams  Fluid:  >/= 1.7 L/day    Corrin Parker, MS, RD, LDN Pager # [phone] After hours/ weekend pager # [phone]

## 2018-11-02 NOTE — Anesthesia Postprocedure Evaluation (Signed)
Anesthesia Post Note  Patient: Kristen Mason  Procedure(s) Performed: INTRAMEDULLARY (IM) RETROGRADE FEMORAL NAILING (Right )     Patient location during evaluation: PACU Anesthesia Type: General Level of consciousness: awake Pain management: pain level controlled Vital Signs Assessment: post-procedure vital signs reviewed and stable Respiratory status: spontaneous breathing Cardiovascular status: stable Postop Assessment: no apparent nausea or vomiting Anesthetic complications: no    Last Vitals:  Vitals:   11/02/18 0350 11/02/18 0736  BP: (!) 139/46 (!) 117/55  Pulse: 90 95  Resp: 18 16  Temp: 37.4 C 36.7 C  SpO2: 92% 91%    Last Pain:  Vitals:   11/02/18 0800  TempSrc:   PainSc: 6    Pain Goal: Patients Stated Pain Goal: 3 (11/02/18 0800)                 Huston Foley

## 2018-11-02 NOTE — Progress Notes (Signed)
[facility] PROGRESS NOTE    Progress Note  Kristen Mason  [id number] DOB: 1934-08-03 DOA: 10/31/2018 PCP: Wenda Low, MD     Brief Narrative:   Kristen Mason is an 84 y.o. female past medical history significant for hypertension diabetes chronic kidney disease presents to the ED with right neck pain after fall.  Assessment/Plan:   Femur fracture, right (HCC) Closed right supracondylar distal femur fracture status post retrograde IMN. Unrestricted range of motion to the right knee and right hip. Physical therapy and occupational therapy have been consulted, orthopedic surgery plans for dressing changes tomorrow. Float heels to prevent pressure sores.  Continue Tylenol and IV narcotics for pain.    Fracture/avulsion of lateral calcaneus: No intervention at this time.  Essential hypertension: KVO IV fluids.  Diabetes mellitus type 2: Hemoglobin A1c is 6.2 continue sliding scale insulin.  Acute blood loss anemia/microcytosis: Her B12 is low is being repleted.  She received 1 unit of packed red blood cells urine surgery.  Acute kidney injury: Likely prerenal azotemia resolved with IV fluid hydration in the setting of NSAID use.  Essential tremor Continue primidone.   DVT prophylaxis: SCD Family Communication:daughter Disposition Plan/Barrier to D/C: SNF in 2-3 days Code Status:     Code Status Orders  (From admission, onward)         Start     Ordered   10/31/18 1405  Full code  Continuous     10/31/18 1415        Code Status History    Date Active Date Inactive Code Status Order ID Comments User Context   06/07/2017 1327 06/11/2017 1604 DNR [id number]  Karmen Bongo, MD ED   Advance Care Planning Activity    Advance Directive Documentation     Most Recent Value  Type of Advance Directive  Healthcare Power of Attorney  Pre-existing out of facility DNR order (yellow form or pink MOST form)  -  "MOST" Form in Place?  -        IV Access:     Peripheral IV   Procedures and diagnostic studies:   Ct Head Wo Contrast  Result Date: 10/31/2018 CLINICAL DATA:  84 year old female with fall and head neck injury. Initial encounter. EXAM: CT HEAD WITHOUT CONTRAST CT CERVICAL SPINE WITHOUT CONTRAST TECHNIQUE: Multidetector CT imaging of the head and cervical spine was performed following the standard protocol without intravenous contrast. Multiplanar CT image reconstructions of the cervical spine were also generated. COMPARISON:  06/07/2017 head CT FINDINGS: CT HEAD FINDINGS Brain: No evidence of acute infarction, hemorrhage, hydrocephalus, extra-axial collection or mass lesion/mass effect. Atrophy and chronic small-vessel white matter ischemic changes again noted. Vascular: Carotid atherosclerotic calcifications noted. Skull: Normal. Negative for fracture or focal lesion. Sinuses/Orbits: No acute finding. Other: None. CT CERVICAL SPINE FINDINGS Alignment: Normal. Skull base and vertebrae: No acute fracture. No primary bone lesion or focal pathologic process. Soft tissues and spinal canal: No prevertebral fluid or swelling. No visible canal hematoma. Disc levels: Multilevel degenerative disc disease/spondylosis noted, moderate from C5-C7. Moderate multilevel facet arthropathy identified. These findings contribute to mild to moderate central spinal and bony foraminal narrowing at several levels. Upper chest: No acute abnormality. Markedly enlarged thyroid gland noted, RIGHT greater than LEFT. Other: None IMPRESSION: 1. No evidence of acute intracranial abnormality. Atrophy and chronic small-vessel white matter ischemic changes. 2. No static evidence of acute injury to the cervical spine. Degenerative changes as described. 3. Thyroid goiter. Electronically Signed   By: Cleatis Polka.D.  On: 10/31/2018 13:49   Ct Cervical Spine Wo Contrast  Result Date: 10/31/2018 CLINICAL DATA:  84 year old female with fall and head neck injury. Initial encounter.  EXAM: CT HEAD WITHOUT CONTRAST CT CERVICAL SPINE WITHOUT CONTRAST TECHNIQUE: Multidetector CT imaging of the head and cervical spine was performed following the standard protocol without intravenous contrast. Multiplanar CT image reconstructions of the cervical spine were also generated. COMPARISON:  06/07/2017 head CT FINDINGS: CT HEAD FINDINGS Brain: No evidence of acute infarction, hemorrhage, hydrocephalus, extra-axial collection or mass lesion/mass effect. Atrophy and chronic small-vessel white matter ischemic changes again noted. Vascular: Carotid atherosclerotic calcifications noted. Skull: Normal. Negative for fracture or focal lesion. Sinuses/Orbits: No acute finding. Other: None. CT CERVICAL SPINE FINDINGS Alignment: Normal. Skull base and vertebrae: No acute fracture. No primary bone lesion or focal pathologic process. Soft tissues and spinal canal: No prevertebral fluid or swelling. No visible canal hematoma. Disc levels: Multilevel degenerative disc disease/spondylosis noted, moderate from C5-C7. Moderate multilevel facet arthropathy identified. These findings contribute to mild to moderate central spinal and bony foraminal narrowing at several levels. Upper chest: No acute abnormality. Markedly enlarged thyroid gland noted, RIGHT greater than LEFT. Other: None IMPRESSION: 1. No evidence of acute intracranial abnormality. Atrophy and chronic small-vessel white matter ischemic changes. 2. No static evidence of acute injury to the cervical spine. Degenerative changes as described. 3. Thyroid goiter. Electronically Signed   By: Margarette Canada M.D.   On: 10/31/2018 13:49   Ct Knee Right Wo Contrast  Result Date: 10/31/2018 CLINICAL DATA:  Right knee pain after falling. Evaluate distal femur fracture. EXAM: CT OF THE RIGHT KNEE WITHOUT CONTRAST TECHNIQUE: Multidetector CT imaging of the right knee was performed according to the standard protocol. Multiplanar CT image reconstructions were also generated.  COMPARISON:  Radiographs same day. FINDINGS: Bones/Joint/Cartilage The bones are diffusely demineralized. There are advanced tricompartmental degenerative changes of the right knee, most advanced in the medial compartment. As seen on earlier radiographs, there is a comminuted and mildly displaced fracture of the distal femur. This fracture involves the distal metadiaphysis and demonstrates up to 9 mm of lateral displacement laterally. The fracture extends into the medial femoral epicondyle, but there is no involvement of the weight-bearing articular surface or intercondylar extension. The patella, proximal tibia and proximal fibula are intact. There is a small lipohemarthrosis, implying intra-articular extension of the distal femur fracture. Ligaments Suboptimally assessed by CT. The cruciate ligaments are grossly intact. Muscles and Tendons The extensor mechanism is intact.  No focal muscular abnormalities. Soft tissues There is a small Baker's cyst containing a loose body. Mild femoropopliteal atherosclerosis is noted. IMPRESSION: 1. Comminuted and mildly displaced intra-articular fracture of the distal femur with associated lipohemarthrosis. This fracture demonstrates no involvement of the articular surface of either femoral condyle or intercondylar extension, although does involve the medial femoral epicondyle. 2. Intact proximal tibia and fibula. 3. Advanced tricompartmental right knee osteoarthritis. Electronically Signed   By: Richardean Sale M.D.   On: 10/31/2018 17:56   Dg C-arm 1-60 Min  Result Date: 11/01/2018 CLINICAL DATA:  Right femur surgery EXAM: RIGHT FEMUR 2 VIEWS; DG C-ARM 1-60 MIN COMPARISON:  10/31/2018 FINDINGS: Five low resolution intraoperative spot views of the right femur. Total fluoroscopy time was 1 minutes 28 seconds. The images demonstrate intramedullary rod and proximal and distal screw fixation of the right femur for comminuted distal femoral fracture. IMPRESSION: Intraoperative  fluoroscopic assistance provided during surgical fixation of distal femur fracture Electronically Signed   By: Maudie Mercury  Francoise Ceo M.D.   On: 11/01/2018 19:14   Dg Femur, Min 2 Views Right  Result Date: 11/01/2018 CLINICAL DATA:  Right femur surgery EXAM: RIGHT FEMUR 2 VIEWS; DG C-ARM 1-60 MIN COMPARISON:  10/31/2018 FINDINGS: Five low resolution intraoperative spot views of the right femur. Total fluoroscopy time was 1 minutes 28 seconds. The images demonstrate intramedullary rod and proximal and distal screw fixation of the right femur for comminuted distal femoral fracture. IMPRESSION: Intraoperative fluoroscopic assistance provided during surgical fixation of distal femur fracture Electronically Signed   By: Donavan Foil M.D.   On: 11/01/2018 19:14   Dg Femur Port, Min 2 Views Right  Result Date: 11/01/2018 CLINICAL DATA:  Postop EXAM: RIGHT FEMUR PORTABLE 2 VIEW COMPARISON:  11/01/2018, 10/31/2018 FINDINGS: Right femoral head projects in joint. Interval intramedullary rod and screw fixation of the right femur for comminuted distal femoral fracture with decreased anterior to posterior displacement of fracture segments. Advanced tricompartment arthritis of the knee. Lipohemarthrosis appears less apparent. IMPRESSION: Interval intramedullary rod and screw fixation of the right femur for distal femoral fracture with expected postsurgical changes. Electronically Signed   By: Donavan Foil M.D.   On: 11/01/2018 19:20     Medical Consultants:    None.  Anti-Infectives:   None  Subjective:    Kristen Mason relates her pain is controlled.  Objective:    Vitals:   11/01/18 2033 11/01/18 2357 11/02/18 0350 11/02/18 0736  BP: (!) 149/70 (!) 135/58 (!) 139/46 (!) 117/55  Pulse: 72 85 90 95  Resp: 20 18 18 16   Temp: 98.4 F (36.9 C) 99.5 F (37.5 C) 99.4 F (37.4 C) 98 F (36.7 C)  TempSrc: Oral Oral Oral Oral  SpO2: 98% 95% 92% 91%  Weight:      Height:       SpO2: 91 % O2 Flow Rate  (L/min): 4 L/min   Intake/Output Summary (Last 24 hours) at 11/02/2018 1235 Last data filed at 11/02/2018 0800 Gross per 24 hour  Intake 3495.79 ml  Output 1375 ml  Net 2120.79 ml   Filed Weights   10/31/18 1120 11/01/18 1412  Weight: 70.3 kg 70.3 kg    Exam: General exam: In no acute distress. Respiratory system: Good air movement and clear to auscultation. Cardiovascular system: S1 & S2 heard, RRR. No JVD.  Gastrointestinal system: Abdomen is nondistended, soft and nontender.  Central nervous system: Alert and oriented. No focal neurological deficits. Extremities: No pedal edema. Skin: No rashes, lesions or ulcers Psychiatry: Judgement and insight appear normal. Mood & affect appropriate.    Data Reviewed:    Labs: Basic Metabolic Panel: Recent Labs  Lab 10/31/18 1115 10/31/18 1445 11/01/18 0351 11/02/18 0203  NA 139  --  138 137  K 4.7  --  4.0 4.2  CL 104  --  106 102  CO2 24  --  23 21*  GLUCOSE 155*  --  117* 118*  BUN 22  --  24* 20  CREATININE 1.36*  --  1.26* 1.07*  CALCIUM 8.1*  --  7.4* 7.6*  MG  --  1.9  --  1.7  PHOS  --  3.7  --  2.9   GFR Estimated Creatinine Clearance: 40.6 mL/min (A) (by C-G formula based on SCr of 1.07 mg/dL (H)). Liver Function Tests: No results for input(s): AST, ALT, ALKPHOS, BILITOT, PROT, ALBUMIN in the last 168 hours. No results for input(s): LIPASE, AMYLASE in the last 168 hours. No results for input(s): AMMONIA in  the last 168 hours. Coagulation profile No results for input(s): INR, PROTIME in the last 168 hours. COVID-19 Labs  Recent Labs    10/31/18 1445  FERRITIN 39    Lab Results  Component Value Date   [place] NEGATIVE 10/31/2018    CBC: Recent Labs  Lab 10/31/18 1115 11/01/18 0351 11/01/18 1427 11/02/18 0203  WBC 8.0 4.7  --  5.9  NEUTROABS 6.2  --   --   --   HGB 9.0* 6.9* 10.1* 8.9*  HCT 29.3* 21.3* 31.8* 27.5*  MCV 102.8* 99.5  --  96.5  PLT 161 104*  --  109*   Cardiac Enzymes:  No results for input(s): CKTOTAL, CKMB, CKMBINDEX, TROPONINI in the last 168 hours. BNP (last 3 results) No results for input(s): PROBNP in the last 8760 hours. CBG: Recent Labs  Lab 11/01/18 1728 11/01/18 2038 11/01/18 2353 11/02/18 0348 11/02/18 0732  GLUCAP 104* 131* 126* 112* 121*   D-Dimer: No results for input(s): DDIMER in the last 72 hours. Hgb A1c: Recent Labs    10/31/18 1445  HGBA1C 6.2*   Lipid Profile: Recent Labs    10/31/18 1445  CHOL 156  HDL 38*  LDLCALC 82  TRIG 180*  CHOLHDL 4.1   Thyroid function studies: Recent Labs    10/31/18 1445  TSH 0.237*   Anemia work up: Recent Labs    10/31/18 1445  VITAMINB12 151*  FERRITIN 39  TIBC 260  IRON 35   Sepsis Labs: Recent Labs  Lab 10/31/18 1115 11/01/18 0351 11/02/18 0203  WBC 8.0 4.7 5.9   Microbiology Recent Results (from the past 240 hour(s))  SARS CORONAVIRUS 2 (TAT 6-24 HRS) Nasopharyngeal Nasopharyngeal Swab     Status: None   Collection Time: 10/31/18 12:45 PM   Specimen: Nasopharyngeal Swab  Result Value Ref Range Status   SARS Coronavirus 2 NEGATIVE NEGATIVE Final    Comment: (NOTE) SARS-CoV-2 target nucleic acids are NOT DETECTED. The SARS-CoV-2 RNA is generally detectable in upper and lower respiratory specimens during the acute phase of infection. Negative results do not preclude SARS-CoV-2 infection, do not rule out co-infections with other pathogens, and should not be used as the sole basis for treatment or other patient management decisions. Negative results must be combined with clinical observations, patient history, and epidemiological information. The expected result is Negative. Fact Sheet for Patients: SugarRoll.be Fact Sheet for Healthcare Providers: https://www.woods-mathews.com/ This test is not yet approved or cleared by the [place] FDA and  has been authorized for detection and/or diagnosis of SARS-CoV-2 by  FDA under an Emergency Use Authorization (EUA). This EUA will remain  in effect (meaning this test can be used) for the duration of the COVID-19 declaration under Section 56 4(b)(1) of the Act, 21 U.S.C. section 360bbb-3(b)(1), unless the authorization is terminated or revoked sooner. Performed at [facility], [place] [address], [place], [place] [address]   Surgical pcr screen     Status: Abnormal   Collection Time: 10/31/18  5:50 PM   Specimen: Nasal Mucosa; Nasal Swab  Result Value Ref Range Status   MRSA, PCR POSITIVE (A) NEGATIVE Final    Comment: RESULT CALLED TO, READ BACK BY AND VERIFIED WITH: L COLLIE RN 1942 10/31/18 A BROWNING    Staphylococcus aureus POSITIVE (A) NEGATIVE Final    Comment: (NOTE) The Xpert SA Assay (FDA approved for NASAL specimens in patients 69 years of age and older), is one component of a comprehensive surveillance program. It is not  intended to diagnose infection nor to guide or monitor treatment. Performed at [facility], [place] [address] [place], [place] [address]      Medications:   . sodium chloride   Intravenous Once  . sodium chloride   Intravenous Once  . acetaminophen  500 mg Oral Q8H  . aspirin EC  81 mg Oral Daily  . carbidopa-levodopa  1 tablet Oral TID  . citalopram  20 mg Oral Daily  . clonazePAM  1 mg Oral QHS  . enoxaparin (LOVENOX) injection  40 mg Subcutaneous Q24H  . [START ON 11/03/2018] influenza vaccine adjuvanted  0.5 mL Intramuscular Tomorrow-1000  . insulin aspart  0-15 Units Subcutaneous TID WC  . insulin aspart  0-5 Units Subcutaneous QHS  . mupirocin ointment   Nasal BID  . pantoprazole  40 mg Oral Daily  . primidone  250 mg Oral BID  . simvastatin  10 mg Oral q1800   Continuous Infusions: . sodium chloride 100 mL/hr at 10/31/18 1605  .  ceFAZolin (ANCEF) IV 1 g (11/02/18 [id number])  . lactated ringers 10 mL/hr at 11/01/18 2216      LOS: 2 days   Charlynne Cousins  [facility]   11/02/2018, 12:35 PM

## 2018-11-02 NOTE — Progress Notes (Signed)
MD aware of bladder scan 400 ml. Verbal order to continue to monitor pt and rescan pt later in the day if pt does not void. Will continue to monitor the pt.

## 2018-11-02 NOTE — Progress Notes (Addendum)
Orthopedic Trauma Service Progress Note  Patient ID: Kristen Mason MRN: [id number] DOB/AGE: 1934/12/10 84 y.o.  Subjective:  Feeling quite nauseous this am  Did receive a dose of norco at 0800 and has not eaten anything yet  Moderate R leg pain   Needs help feeding self because of tremors   + foley   Review of Systems  Constitutional: Negative for chills and fever.  Respiratory: Negative for shortness of breath.   Cardiovascular: Negative for chest pain.  Gastrointestinal: Positive for nausea. Negative for vomiting.  Neurological: Positive for tremors (chronic ).    Objective:   VITALS:   Vitals:   11/01/18 2033 11/01/18 2357 11/02/18 0350 11/02/18 0736  BP: (!) 149/70 (!) 135/58 (!) 139/46 (!) 117/55  Pulse: 72 85 90 95  Resp: 20 18 18 16   Temp: 98.4 F (36.9 C) 99.5 F (37.5 C) 99.4 F (37.4 C) 98 F (36.7 C)  TempSrc: Oral Oral Oral Oral  SpO2: 98% 95% 92% 91%  Weight:      Height:        Estimated body mass index is 25.79 kg/m as calculated from the following:   Height as of this encounter: 5\' 5"  (1.651 m).   Weight as of this encounter: 70.3 kg.   Intake/Output      10/06 0701 - 10/07 0700 10/07 0701 - 10/08 0700   P.O. 240    I.V. (mL/kg) 2965.8 (42.2)    Blood 315    IV Piggyback 50    Total Intake(mL/kg) 3570.8 (50.8)    Urine (mL/kg/hr) 1325 (0.8)    Stool 0    Blood 50    Total Output 1375    Net +2195.8           LABS  Results for orders placed or performed during the hospital encounter of 10/31/18 (from the past 24 hour(s))  Glucose, capillary     Status: Abnormal   Collection Time: 11/01/18 11:27 AM  Result Value Ref Range   Glucose-Capillary 110 (H) 70 - 99 mg/dL  Glucose, capillary     Status: Abnormal   Collection Time: 11/01/18  1:36 PM  Result Value Ref Range   Glucose-Capillary 102 (H) 70 - 99 mg/dL  Hemoglobin and hematocrit, blood     Status:  Abnormal   Collection Time: 11/01/18  2:27 PM  Result Value Ref Range   Hemoglobin 10.1 (L) 12.0 - 15.0 g/dL   HCT 31.8 (L) 36.0 - 46.0 %  Glucose, capillary     Status: Abnormal   Collection Time: 11/01/18  5:28 PM  Result Value Ref Range   Glucose-Capillary 104 (H) 70 - 99 mg/dL  Glucose, capillary     Status: Abnormal   Collection Time: 11/01/18  8:38 PM  Result Value Ref Range   Glucose-Capillary 131 (H) 70 - 99 mg/dL  Glucose, capillary     Status: Abnormal   Collection Time: 11/01/18 11:53 PM  Result Value Ref Range   Glucose-Capillary 126 (H) 70 - 99 mg/dL  T4, free     Status: None   Collection Time: 11/02/18  2:03 AM  Result Value Ref Range   Free T4 1.00 0.61 - 1.12 ng/dL  CBC     Status: Abnormal   Collection Time: 11/02/18  2:03 AM  Result Value Ref Range  WBC 5.9 4.0 - 10.5 K/uL   RBC 2.85 (L) 3.87 - 5.11 MIL/uL   Hemoglobin 8.9 (L) 12.0 - 15.0 g/dL   HCT 27.5 (L) 36.0 - 46.0 %   MCV 96.5 80.0 - 100.0 fL   MCH 31.2 26.0 - 34.0 pg   MCHC 32.4 30.0 - 36.0 g/dL   RDW 16.7 (H) 11.5 - 15.5 %   Platelets 109 (L) 150 - 400 K/uL   nRBC 0.0 0.0 - 0.2 %  Basic metabolic panel     Status: Abnormal   Collection Time: 11/02/18  2:03 AM  Result Value Ref Range   Sodium 137 135 - 145 mmol/L   Potassium 4.2 3.5 - 5.1 mmol/L   Chloride 102 98 - 111 mmol/L   CO2 21 (L) 22 - 32 mmol/L   Glucose, Bld 118 (H) 70 - 99 mg/dL   BUN 20 8 - 23 mg/dL   Creatinine, Ser 1.07 (H) 0.44 - 1.00 mg/dL   Calcium 7.6 (L) 8.9 - 10.3 mg/dL   GFR calc non Af Amer 49 (L) >60 mL/min   GFR calc Af Amer 56 (L) >60 mL/min   Anion gap 14 5 - 15  Prealbumin     Status: Abnormal   Collection Time: 11/02/18  2:03 AM  Result Value Ref Range   Prealbumin 11.4 (L) 18 - 38 mg/dL  Magnesium     Status: None   Collection Time: 11/02/18  2:03 AM  Result Value Ref Range   Magnesium 1.7 1.7 - 2.4 mg/dL  Phosphorus     Status: None   Collection Time: 11/02/18  2:03 AM  Result Value Ref Range    Phosphorus 2.9 2.5 - 4.6 mg/dL  Glucose, capillary     Status: Abnormal   Collection Time: 11/02/18  3:48 AM  Result Value Ref Range   Glucose-Capillary 112 (H) 70 - 99 mg/dL  Urinalysis, Routine w reflex microscopic     Status: Abnormal   Collection Time: 11/02/18  5:58 AM  Result Value Ref Range   Color, Urine YELLOW YELLOW   APPearance CLEAR CLEAR   Specific Gravity, Urine 1.017 1.005 - 1.030   pH 5.0 5.0 - 8.0   Glucose, UA NEGATIVE NEGATIVE mg/dL   Hgb urine dipstick SMALL (A) NEGATIVE   Bilirubin Urine NEGATIVE NEGATIVE   Ketones, ur 80 (A) NEGATIVE mg/dL   Protein, ur 30 (A) NEGATIVE mg/dL   Nitrite NEGATIVE NEGATIVE   Leukocytes,Ua MODERATE (A) NEGATIVE   RBC / HPF 0-5 0 - 5 RBC/hpf   WBC, UA 21-50 0 - 5 WBC/hpf   Bacteria, UA RARE (A) NONE SEEN   Mucus PRESENT    Granular Casts, UA PRESENT   Glucose, capillary     Status: Abnormal   Collection Time: 11/02/18  7:32 AM  Result Value Ref Range   Glucose-Capillary 121 (H) 70 - 99 mg/dL     PHYSICAL EXAM:   Gen: sitting up in bed, NAD Lungs: clear anterior fields Cardiac: RRR, s1 and s2 Abd: + BS, NT Ext:       Right Lower Extremity   Dressing c/d/i   Ext warm   + DP pulse  Ankle flexion, extension, inversion and eversion intact  No pain with passive stretching   Sensation at baseline   Chronic flexion contractures of knees B due to advanced endstage DJD L>R  R hip with significant restrictions in abd and ER/IR due to endstage DJD    Assessment/Plan: 1 Day Post-Op  Principal Problem:   Femur fracture, right (HCC) Active Problems:   Controlled diabetes mellitus ([place])   Essential hypertension   Essential tremor   Acute on chronic kidney failure (HCC)   Macrocytic anemia   Anti-infectives (From admission, onward)   Start     Dose/Rate Route Frequency Ordered Stop   11/01/18 2330  ceFAZolin (ANCEF) IVPB 1 g/50 mL premix     1 g 100 mL/hr over 30 Minutes Intravenous Every 8 hours 11/01/18 2029  11/02/18 2329   11/01/18 1400  ceFAZolin (ANCEF) IVPB 2g/100 mL premix     2 g 200 mL/hr over 30 Minutes Intravenous  Once 11/01/18 1350 11/01/18 1533    .  POD/HD#: 1 84 y/o female s/p ground level fall with R supracondylar distal femur fracture    -fall   - closed R supracondylar distal femur fracture s/p retrograde IMN   TDWB R leg  Unrestricted ROM R knee and hip (at baseline she is restricted due to advanced endstage DJD)  PT/OT evals  Dressing change tomorrow or Friday   Ice and elevate   Float heels off bed to prevent pressure sores and to keep knee in full extension when at rest so as to not exacerbate her contractures any further                 - Pain management:            scheduled tylenol   Can continue with norco for now for severe pain. Think her nausea is related to the fact that she took pain meds w/o having had anything to eat   If she continues with nausea we can restart her home ultram    - ABL anemia/Hemodynamics            stable this am   Cbc in am    - Medical issues              Per medical team                Diabetes                          On SSI    - DVT/PE prophylaxis:             Lovenox x 4 weeks    - ID:              periop abx    - Metabolic Bone Disease:             Will check labs- pending              Will need DEXA in 4-8 weeks             Fracture indicative of osteoporosis                          Will likely need pharmacologic treatment of her osteoporosis                          Would strongly favor starting treatment of her osteoporosis before considering Total joint replacement    - Activity:             TDWB R leg    - FEN/GI prophylaxis/Foley/Lines:             carb mod diet, assistance with feeding due to tremors  One time low dose zofran now for nausea, no reglan or compazine due to increased risk of extrapyramidal side effects   Foley    Do not use purewick once foley is removed. Bedpan or BSC or toiled once  mobilizing better     - Impediments to fracture healing:             Osteoporosis             DM             Falls/balance issues             Psychotropic medication    - Dispo:  Therapy evals  Will consult SW for SNF in the event she needs SNF, which is likely as the only help at home is her daughter who works full time         Jari Pigg, PA-C [phone] (C) 11/02/2018, 8:41 AM  [facility] [place] [address] [phone] (F)

## 2018-11-02 NOTE — Plan of Care (Signed)

## 2018-11-03 LAB — PTH, INTACT AND CALCIUM
Calcium, Total (PTH): 7.6 mg/dL — ABNORMAL LOW (ref 8.7–10.3)
PTH: 59 pg/mL (ref 15–65)

## 2018-11-03 LAB — CBC
HCT: 27.5 % — ABNORMAL LOW (ref 36.0–46.0)
Hemoglobin: 8.7 g/dL — ABNORMAL LOW (ref 12.0–15.0)
MCH: 30.9 pg (ref 26.0–34.0)
MCHC: 31.6 g/dL (ref 30.0–36.0)
MCV: 97.5 fL (ref 80.0–100.0)
Platelets: 125 10*3/uL — ABNORMAL LOW (ref 150–400)
RBC: 2.82 MIL/uL — ABNORMAL LOW (ref 3.87–5.11)
RDW: 16 % — ABNORMAL HIGH (ref 11.5–15.5)
WBC: 6.6 10*3/uL (ref 4.0–10.5)
nRBC: 0 % (ref 0.0–0.2)

## 2018-11-03 LAB — CALCITRIOL (1,25 DI-OH VIT D): Vit D, 1,25-Dihydroxy: 9.4 pg/mL — ABNORMAL LOW (ref 19.9–79.3)

## 2018-11-03 LAB — GLUCOSE, CAPILLARY
Glucose-Capillary: 123 mg/dL — ABNORMAL HIGH (ref 70–99)
Glucose-Capillary: 130 mg/dL — ABNORMAL HIGH (ref 70–99)
Glucose-Capillary: 131 mg/dL — ABNORMAL HIGH (ref 70–99)
Glucose-Capillary: 131 mg/dL — ABNORMAL HIGH (ref 70–99)
Glucose-Capillary: 132 mg/dL — ABNORMAL HIGH (ref 70–99)
Glucose-Capillary: 133 mg/dL — ABNORMAL HIGH (ref 70–99)

## 2018-11-03 MED ORDER — BISACODYL 10 MG RE SUPP
10.0000 mg | Freq: Every day | RECTAL | Status: DC | PRN
  Administered 2018-11-03 – 2018-11-09 (×2): 10 mg via RECTAL
  Filled 2018-11-03 (×2): qty 1

## 2018-11-03 MED ORDER — DOCUSATE SODIUM 100 MG PO CAPS
100.0000 mg | ORAL_CAPSULE | Freq: Two times a day (BID) | ORAL | Status: DC
  Administered 2018-11-03 – 2018-11-10 (×14): 100 mg via ORAL
  Filled 2018-11-03 (×15): qty 1

## 2018-11-03 MED ORDER — PANTOPRAZOLE SODIUM 40 MG IV SOLR
40.0000 mg | Freq: Two times a day (BID) | INTRAVENOUS | Status: DC
  Administered 2018-11-03 – 2018-11-10 (×13): 40 mg via INTRAVENOUS
  Filled 2018-11-03 (×14): qty 40

## 2018-11-03 MED ORDER — TRAMADOL HCL 50 MG PO TABS
50.0000 mg | ORAL_TABLET | Freq: Four times a day (QID) | ORAL | Status: DC | PRN
  Administered 2018-11-03 – 2018-11-08 (×7): 50 mg via ORAL
  Filled 2018-11-03 (×7): qty 1

## 2018-11-03 MED ORDER — VITAMIN C 500 MG PO TABS
500.0000 mg | ORAL_TABLET | Freq: Every day | ORAL | Status: DC
  Administered 2018-11-03 – 2018-11-10 (×8): 500 mg via ORAL
  Filled 2018-11-03 (×8): qty 1

## 2018-11-03 MED ORDER — VITAMIN D (ERGOCALCIFEROL) 1.25 MG (50000 UNIT) PO CAPS
50000.0000 [IU] | ORAL_CAPSULE | ORAL | Status: DC
  Administered 2018-11-04: 50000 [IU] via ORAL
  Filled 2018-11-03: qty 1

## 2018-11-03 MED ORDER — SODIUM CHLORIDE 0.9 % IV SOLN
INTRAVENOUS | Status: AC
  Administered 2018-11-03 (×2): via INTRAVENOUS

## 2018-11-03 MED ORDER — PROMETHAZINE HCL 25 MG/ML IJ SOLN
12.5000 mg | Freq: Three times a day (TID) | INTRAMUSCULAR | Status: DC | PRN
  Administered 2018-11-06 – 2018-11-10 (×5): 12.5 mg via INTRAVENOUS
  Filled 2018-11-03 (×5): qty 1

## 2018-11-03 MED ORDER — ALUM & MAG HYDROXIDE-SIMETH 200-200-20 MG/5ML PO SUSP
15.0000 mL | Freq: Four times a day (QID) | ORAL | Status: DC | PRN
  Administered 2018-11-03 – 2018-11-08 (×2): 15 mL via ORAL
  Filled 2018-11-03 (×2): qty 30

## 2018-11-03 MED ORDER — VITAMIN D 25 MCG (1000 UNIT) PO TABS
2000.0000 [IU] | ORAL_TABLET | Freq: Two times a day (BID) | ORAL | Status: DC
  Administered 2018-11-03 – 2018-11-10 (×15): 2000 [IU] via ORAL
  Filled 2018-11-03 (×15): qty 2

## 2018-11-03 MED ORDER — ONDANSETRON HCL 4 MG/2ML IJ SOLN: 4.0000 mg | Freq: Four times a day (QID) | INTRAMUSCULAR | Status: AC

## 2018-11-03 MED ORDER — ONDANSETRON HCL 4 MG PO TABS
4.0000 mg | ORAL_TABLET | Freq: Four times a day (QID) | ORAL | Status: AC
  Administered 2018-11-03 – 2018-11-05 (×6): 4 mg via ORAL
  Filled 2018-11-03 (×6): qty 1

## 2018-11-03 NOTE — Progress Notes (Addendum)
Pt voided incont, bladder scanned for 647cc. Got pt up to [facility], had large BM and voided moderate amount.  Back to bed and bladder scanned for 346cc.   Will contact Dr Aileen Fass.    At 1715, MD called back, no new orders, pt voiding.

## 2018-11-03 NOTE — Evaluation (Signed)
Physical Therapy Evaluation Patient Details Name: Kristen Mason MRN: [id number] DOB: 26-Aug-1934 Today's Date: 11/03/2018   History of Present Illness  84 yo admitted after fall at home with right femur fx s/p IM nail and right calcaneal fx non-op. PMhx: HTN, DM, CKD, neuropathy, bil knee flexion contracture due to endstage OA  Clinical Impression  Pt very pleasant and willing to move. Pt with nausea this am but non during session. Pt talking about her tv watching habits of 90 day fiance, and able to tell about her flowers from her nephew in room. Daughter present throughout session whom pt lives with and both very talkative and interactive. Pt with decreased strength, transfers and mobility who currently requires 2 person assist for transfers and will require SNF at D/C with pt and daughter agreeable. Pt will benefit from acute therapy to maximize mobility, safety and function to decrease burden of care.      Follow Up Recommendations SNF    Equipment Recommendations  None recommended by PT    Recommendations for Other Services       Precautions / Restrictions Precautions Precautions: Fall Restrictions RLE Weight Bearing: Touchdown weight bearing      Mobility  Bed Mobility Overal bed mobility: Needs Assistance Bed Mobility: Supine to Sit     Supine to sit: Mod assist;HOB elevated     General bed mobility comments: HOB 35 degrees, increased time with assist to elevate trunk and fully scoot to EOB. Pt moving RLE and able to perform sequential scooting once EOB  Transfers Overall transfer level: Needs assistance   Transfers: Sit to/from [employer] Transfers Sit to Stand: Mod assist;Max assist;+2 physical assistance Stand pivot transfers: Mod assist;+2 physical assistance       General transfer comment: pt stood from bed with Mod +2 assist with RLE blocked on P.T. foot and pivoted with RW to [facility] toward left with cues and assist to move RW and sequence transfer. PT  then stood from [facility] with max +2 assist with cues for hand placement and assist to rise. In standing BSC switched for recliner by 3rd assist  Ambulation/Gait             General Gait Details: unable  Stairs            Wheelchair Mobility    Modified Rankin (Stroke Patients Only)       Balance Overall balance assessment: Needs assistance Sitting-balance support: No upper extremity supported Sitting balance-Leahy Scale: Fair     Standing balance support: Bilateral upper extremity supported Standing balance-Leahy Scale: Poor Standing balance comment: bil UE support and external support in standing                             Pertinent Vitals/Pain Pain Assessment: 0-10 Pain Score: 8  Pain Location: RLE with movement Pain Descriptors / Indicators: Aching;Guarding Pain Intervention(s): Limited activity within patient's tolerance;Monitored during session;Repositioned;RN gave pain meds during session    Marcus expects to be discharged to:: Private residence Living Arrangements: Children Available Help at Discharge: Family;Available PRN/intermittently Type of Home: House Home Access: Level entry     Home Layout: One level Home Equipment: Bedside commode;Walker - 4 wheels;Hospital bed      Prior Function Level of Independence: Independent with assistive device(s)         Comments: sponge bathes, lives with daughter, home alone during the day     Hand Dominance  Extremity/Trunk Assessment   Upper Extremity Assessment Upper Extremity Assessment: Defer to OT evaluation    Lower Extremity Assessment Lower Extremity Assessment: Generalized weakness    Cervical / Trunk Assessment Cervical / Trunk Assessment: Kyphotic  Communication   Communication: No difficulties  Cognition Arousal/Alertness: Awake/alert Behavior During Therapy: WFL for tasks assessed/performed Overall Cognitive Status: Impaired/Different from  baseline Area of Impairment: Safety/judgement;Problem solving                         Safety/Judgement: Decreased awareness of deficits;Decreased awareness of safety   Problem Solving: Slow processing General Comments: unaware she need to try to void on [facility]      General Comments      Exercises General Exercises - Lower Extremity Long Arc Quad: AROM;AAROM;Right;Left;Seated;10 reps(AAROM on RLE)   Assessment/Plan    PT Assessment Patient needs continued PT services  PT Problem List Decreased strength;Decreased mobility;Decreased activity tolerance;Decreased balance;Decreased knowledge of use of DME;Decreased cognition;Decreased range of motion       PT Treatment Interventions DME instruction;Therapeutic activities;Therapeutic exercise;Patient/family education;Gait training;Balance training;Functional mobility training    PT Goals (Current goals can be found in the Care Plan section)  Acute Rehab PT Goals Patient Stated Goal: watch tv (90 day fiance) PT Goal Formulation: With patient/family Time For Goal Achievement: 11/17/18 Potential to Achieve Goals: Fair    Frequency Min 3X/week   Barriers to discharge Decreased caregiver support      Co-evaluation PT/OT/SLP Co-Evaluation/Treatment: Yes Reason for Co-Treatment: Complexity of the patient's impairments (multi-system involvement);For patient/therapist safety PT goals addressed during session: Mobility/safety with mobility;Proper use of DME         AM-PAC PT "6 Clicks" Mobility  Outcome Measure Help needed turning from your back to your side while in a flat bed without using bedrails?: A Lot Help needed moving from lying on your back to sitting on the side of a flat bed without using bedrails?: A Lot Help needed moving to and from a bed to a chair (including a wheelchair)?: A Lot Help needed standing up from a chair using your arms (e.g., wheelchair or bedside chair)?: Total Help needed to walk in hospital  room?: Total Help needed climbing 3-5 steps with a railing? : Total 6 Click Score: 9    End of Session Equipment Utilized During Treatment: Gait belt Activity Tolerance: Patient tolerated treatment well Patient left: in chair;with chair alarm set;with call bell/phone within reach;with family/visitor present Nurse Communication: Mobility status;Weight bearing status;Precautions;Other (comment)(+2 pivot for transfer) PT Visit Diagnosis: Other abnormalities of gait and mobility (R26.89);Muscle weakness (generalized) (M62.81);History of falling (Z91.81)    Time: [id number] PT Time Calculation (min) (ACUTE ONLY): 30 min   Charges:   PT Evaluation $PT Eval Moderate Complexity: 1 Mod          [place], PT Acute Rehabilitation Services Pager: [phone] Office: [phone]   Sandy Salaam Ameera Tigue 11/03/2018, 12:55 PM

## 2018-11-03 NOTE — Care Management Important Message (Signed)
Important Message  Patient Details  Name: Kristen Mason MRN: [id number] Date of Birth: Mar 13, 1934   Medicare Important Message Given:  Yes     Kristen Mason 11/03/2018, 3:15 PM

## 2018-11-03 NOTE — Evaluation (Signed)
Occupational Therapy Evaluation Patient Details Name: Kristen Mason MRN: [id number] DOB: 1934-09-14 Today's Date: 11/03/2018    History of Present Illness 84 yo admitted after fall at home with right femur fx s/p IM nail and right calcaneal fx non-op. PMhx: HTN, DM, CKD, neuropathy, bil knee flexion contracture due to endstage OA   Clinical Impression   Pt was ambulating with a rollator and able to sponge and dress herself PTA. She stayed alone while her daughter worked. Pt presents with generalized weakness, difficulty maintaining TDWB with transfer and poor standing balance. She requires 2 person assist for OOB and up to total assist for ADL. Pt will needs post acute rehab in SNF prior to return home. Will follow acutely.    Follow Up Recommendations  SNF;Supervision/Assistance - 24 hour    Equipment Recommendations       Recommendations for Other Services       Precautions / Restrictions Precautions Precautions: Fall Restrictions Weight Bearing Restrictions: Yes RLE Weight Bearing: Touchdown weight bearing      Mobility Bed Mobility Overal bed mobility: Needs Assistance Bed Mobility: Supine to Sit     Supine to sit: Mod assist;HOB elevated     General bed mobility comments: HOB 35 degrees, increased time with assist to elevate trunk and fully scoot to EOB. Pt moving RLE and able to perform sequential scooting once EOB  Transfers Overall transfer level: Needs assistance Equipment used: Rolling walker (2 wheeled) Transfers: Sit to/from [employer] Sit to Stand: Mod assist;Max assist;+2 physical assistance Stand pivot transfers: Mod assist;+2 physical assistance       General transfer comment: pt stood from bed with Mod +2 assist with RLE blocked on P.T. foot and pivoted with RW to [facility] toward left with cues and assist to move RW and sequence transfer. PT then stood from [facility] with max +2 assist with cues for hand placement and assist to rise. In  standing BSC switched for recliner by 3rd assist    Balance Overall balance assessment: Needs assistance Sitting-balance support: No upper extremity supported Sitting balance-Leahy Scale: Fair     Standing balance support: Bilateral upper extremity supported Standing balance-Leahy Scale: Poor Standing balance comment: bil UE support and external support in standing                           ADL either performed or assessed with clinical judgement   ADL Overall ADL's : Needs assistance/impaired Eating/Feeding: Set up;Sitting   Grooming: Brushing hair;Sitting;Maximal assistance   Upper Body Bathing: Minimal assistance;Sitting   Lower Body Bathing: Total assistance;+2 for physical assistance;Sit to/from stand   Upper Body Dressing : Minimal assistance;Sitting   Lower Body Dressing: Total assistance;+2 for physical assistance;Sit to/from stand   Toilet Transfer: +2 for physical assistance;Moderate assistance;Stand-pivot;RW;BSC   Toileting- Clothing Manipulation and Hygiene: +2 for physical assistance;Total assistance;Sit to/from stand               Vision Patient Visual Report: No change from baseline       Perception     Praxis      Pertinent Vitals/Pain Pain Assessment: Faces Pain Score: 8  Pain Location: RLE with movement Pain Descriptors / Indicators: Aching;Guarding Pain Intervention(s): Repositioned     Hand Dominance Right   Extremity/Trunk Assessment Upper Extremity Assessment Upper Extremity Assessment: RUE deficits/detail;LUE deficits/detail RUE Deficits / Details: tremor RUE Coordination: decreased fine motor;decreased gross motor LUE Deficits / Details: tremor LUE Coordination: decreased fine motor;decreased gross  motor   Lower Extremity Assessment Lower Extremity Assessment: Defer to PT evaluation   Cervical / Trunk Assessment Cervical / Trunk Assessment: Kyphotic   Communication Communication Communication: No difficulties    Cognition Arousal/Alertness: Awake/alert Behavior During Therapy: WFL for tasks assessed/performed Overall Cognitive Status: Impaired/Different from baseline Area of Impairment: Safety/judgement;Problem solving                         Safety/Judgement: Decreased awareness of deficits;Decreased awareness of safety   Problem Solving: Slow processing General Comments: unaware she need to try to void on [facility]   General Comments       Exercises General Exercises - Lower Extremity Long Arc Quad: AROM;AAROM;Right;Left;Seated;10 reps(AAROM on RLE)   Shoulder Instructions      Home Living Family/patient expects to be discharged to:: Private residence Living Arrangements: Children Available Help at Discharge: Family;Available PRN/intermittently Type of Home: Apartment Home Access: Level entry     Home Layout: One level     Bathroom Shower/Tub: Teacher, early years/pre: Standard     Home Equipment: Bedside commode;Walker - 4 wheels;Hospital bed          Prior Functioning/Environment Level of Independence: Independent with assistive device(s)        Comments: sponge bathes, lives with daughter, home alone during the day        OT Problem List: Decreased strength;Decreased activity tolerance;Impaired balance (sitting and/or standing);Impaired UE functional use;Impaired tone;Decreased knowledge of use of DME or AE;Decreased knowledge of precautions;Decreased cognition;Decreased safety awareness;Decreased coordination;Pain      OT Treatment/Interventions: Self-care/ADL training;DME and/or AE instruction;Therapeutic activities;Cognitive remediation/compensation;Patient/family education;Balance training    OT Goals(Current goals can be found in the care plan section) Acute Rehab OT Goals Patient Stated Goal: watch tv (90 day fiance) OT Goal Formulation: With patient Time For Goal Achievement: 11/17/18 Potential to Achieve Goals: Good ADL Goals Pt Will  Perform Grooming: with set-up;sitting Pt Will Perform Lower Body Bathing: with mod assist;sit to/from stand;with adaptive equipment Pt Will Perform Lower Body Dressing: with mod assist;sit to/from stand;with adaptive equipment Pt Will Transfer to Toilet: with mod assist;stand pivot transfer;bedside commode Pt Will Perform Toileting - Clothing Manipulation and hygiene: with mod assist;sitting/lateral leans Additional ADL Goal #1: Pt will perform bed mobility with supervision for safety in preparation for ADL.  OT Frequency: Min 2X/week   Barriers to D/C:            Co-evaluation PT/OT/SLP Co-Evaluation/Treatment: Yes Reason for Co-Treatment: For patient/therapist safety PT goals addressed during session: Mobility/safety with mobility;Proper use of DME OT goals addressed during session: ADL's and self-care      AM-PAC OT "6 Clicks" Daily Activity     Outcome Measure Help from another person eating meals?: A Little Help from another person taking care of personal grooming?: A Lot Help from another person toileting, which includes using toliet, bedpan, or urinal?: Total Help from another person bathing (including washing, rinsing, drying)?: A Lot Help from another person to put on and taking off regular upper body clothing?: A Little Help from another person to put on and taking off regular lower body clothing?: Total 6 Click Score: 12   End of Session Equipment Utilized During Treatment: Gait belt;Rolling walker Nurse Communication: Mobility status  Activity Tolerance: Patient tolerated treatment well Patient left: in chair;with call bell/phone within reach;with chair alarm set;with family/visitor present  OT Visit Diagnosis: Unsteadiness on feet (R26.81);Other abnormalities of gait and mobility (R26.89);Pain;Muscle weakness (generalized) (M62.81);Other symptoms  and signs involving cognitive function                Time: [id number] OT Time Calculation (min): 30 min Charges:  OT  General Charges $OT Visit: 1 Visit OT Evaluation $OT Eval Moderate Complexity: 1 Mod  Nestor Lewandowsky, OTR/L Acute Rehabilitation Services Pager: [phone] Office: [phone]  Malka So 11/03/2018, 1:24 PM

## 2018-11-03 NOTE — NC FL2 (Signed)
[place] LEVEL OF CARE SCREENING TOOL     IDENTIFICATION  Patient Name: Kristen Mason Birthdate: 1934/03/27 Sex: female Admission Date (Current Location): 10/31/2018  [facility] and [place] Number:  Herbalist and Address:  The . [facility], [place] [address], [place], [place] [address]      Provider Number: [id number]  Attending Physician Name and Address:  Charlynne Cousins, MD  Relative Name and Phone Number:  [name] [phone]    Current Level of Care: Hospital Recommended Level of Care: [place] Prior Approval Number:    Date Approved/Denied:   PASRR Number: [id number]  Discharge Plan: SNF    Current Diagnoses: Patient Active Problem List   Diagnosis Date Noted  . Femur fracture, right ([place]) 10/31/2018  . Acute on chronic kidney failure ([place]) 10/31/2018  . Macrocytic anemia 10/31/2018  . Falls 07/24/2017  . COPD exacerbation ([place]) 06/16/2017  . Parkinson's disease ([place]) 06/16/2017  . Essential tremor 06/16/2017  . Peripheral neuropathy 06/16/2017  . Depression 06/16/2017  . Anxiety 06/16/2017  . Acute respiratory failure with hypoxia ([place]) 06/07/2017  . Fall at home, initial encounter 06/07/2017  . Multiple rib fractures 06/07/2017  . Controlled diabetes mellitus ([place]) 06/07/2017  . Essential hypertension 06/07/2017  . Tobacco dependence 06/07/2017    Orientation RESPIRATION BLADDER Height & Weight     Self, Time, Situation, Place  Normal External catheter Weight: 70.3 kg Height:  5\' 5"  (165.1 cm)  BEHAVIORAL SYMPTOMS/MOOD NEUROLOGICAL BOWEL NUTRITION STATUS  Other (Comment)(N/A) (N/A) Continent Diet(Carb modified; Thin liquids)  AMBULATORY STATUS COMMUNICATION OF NEEDS Skin   Extensive Assist Verbally Surgical wounds(Rash lower abdomen; Right knee incision)                       Personal Care Assistance Level of Assistance  Bathing, Feeding, Dressing Bathing Assistance:  Maximum assistance Feeding assistance: Limited assistance Dressing Assistance: Maximum assistance     Functional Limitations Info  Sight, Hearing, Speech Sight Info: Adequate Hearing Info: Adequate Speech Info: Adequate(missing teeth)    SPECIAL CARE FACTORS FREQUENCY  PT (By licensed PT), OT (By licensed OT)     PT Frequency: Min 3X/week OT Frequency: Min 3X/week            Contractures Contractures Info: Not present    Additional Factors Info  Code Status, Allergies, Insulin Sliding Scale, Psychotropic Code Status Info: Full Code Allergies Info: Statins Psychotropic Info: Celexa; Klonopin Insulin Sliding Scale Info: 3 times daily with meals       Current Medications (11/03/2018):  This is the current hospital active medication list Current Facility-Administered Medications  Medication Dose Route Frequency Provider Last Rate Last Dose  . 0.9 %  sodium chloride infusion (Manually program via Guardrails IV Fluids)   Intravenous Once Ainsley Spinner, PA-C      . 0.9 %  sodium chloride infusion (Manually program via Guardrails IV Fluids)   Intravenous Once Ainsley Spinner, PA-C      . 0.9 %  sodium chloride infusion   Intravenous Continuous Charlynne Cousins, MD 10 mL/hr at 11/02/18 1315    . 0.9 %  sodium chloride infusion   Intravenous Continuous Charlynne Cousins, MD 75 mL/hr at 11/03/18 1120    . acetaminophen (TYLENOL) tablet 650 mg  650 mg Oral Q6H PRN Ainsley Spinner, PA-C   650 mg at 11/01/18 0026   Or  . acetaminophen (TYLENOL) suppository 650 mg  650 mg Rectal Q6H PRN  Ainsley Spinner, PA-C      . acetaminophen (TYLENOL) tablet 500 mg  500 mg Oral Q8H Ainsley Spinner, PA-C   500 mg at 11/03/18 1255  . alum & mag hydroxide-simeth (MAALOX/MYLANTA) 200-200-20 MG/5ML suspension 15 mL  15 mL Oral Q6H PRN Ainsley Spinner, PA-C   15 mL at 11/03/18 1124  . aspirin EC tablet 81 mg  81 mg Oral Daily Ainsley Spinner, PA-C   81 mg at 11/02/18 [id number]  . bisacodyl (DULCOLAX) suppository 10 mg  10 mg  Rectal Daily PRN Ainsley Spinner, PA-C   10 mg at 11/03/18 1240  . carbidopa-levodopa (SINEMET IR) 25-100 MG per tablet immediate release 1 tablet  1 tablet Oral TID Ainsley Spinner, PA-C   1 tablet at 11/03/18 1103  . cholecalciferol (VITAMIN D3) tablet 2,000 Units  2,000 Units Oral BID Ainsley Spinner, PA-C      . citalopram (CELEXA) tablet 20 mg  20 mg Oral Daily Ainsley Spinner, PA-C   20 mg at 11/03/18 1120  . clonazePAM (KLONOPIN) tablet 1 mg  1 mg Oral QHS Ainsley Spinner, PA-C   1 mg at 11/02/18 2143  . docusate sodium (COLACE) capsule 100 mg  100 mg Oral BID Ainsley Spinner, PA-C   100 mg at 11/03/18 1123  . enoxaparin (LOVENOX) injection 40 mg  40 mg Subcutaneous Q24H Vann, Jessica U, DO   40 mg at 11/03/18 0920  . feeding supplement (ENSURE ENLIVE) (ENSURE ENLIVE) liquid 237 mL  237 mL Oral TID BM Charlynne Cousins, MD   237 mL at 11/02/18 1733  . influenza vaccine adjuvanted (FLUAD) injection 0.5 mL  0.5 mL Intramuscular Tomorrow-1000 Charlynne Cousins, MD      . insulin aspart (novoLOG) injection 0-15 Units  0-15 Units Subcutaneous TID WC Ainsley Spinner, PA-C   2 Units at 11/03/18 1255  . insulin aspart (novoLOG) injection 0-5 Units  0-5 Units Subcutaneous QHS Ainsley Spinner, PA-C      . lactated ringers infusion   Intravenous Continuous Ainsley Spinner, PA-C   Stopped at 11/02/18 1320  . morphine 2 MG/ML injection 2 mg  2 mg Intravenous Q4H PRN Ainsley Spinner, PA-C   2 mg at 11/01/18 0836  . mupirocin ointment (BACTROBAN) 2 %   Nasal BID Ainsley Spinner, PA-C      . ondansetron [facility]) injection 4 mg  4 mg Intravenous Q6H Charlynne Cousins, MD       Or  . ondansetron [facility]) tablet 4 mg  4 mg Oral Q6H Charlynne Cousins, MD      . pantoprazole (PROTONIX) injection 40 mg  40 mg Intravenous Q12H Charlynne Cousins, MD      . primidone (MYSOLINE) tablet 250 mg  250 mg Oral BID Ainsley Spinner, PA-C   250 mg at 11/03/18 1121  . promethazine (PHENERGAN) injection 12.5 mg  12.5 mg Intravenous Q8H PRN Ainsley Spinner, PA-C       . simvastatin (ZOCOR) tablet 10 mg  10 mg Oral q1800 Ainsley Spinner, PA-C   10 mg at 11/02/18 1707  . traMADol (ULTRAM) tablet 50 mg  50 mg Oral Q6H PRN Ainsley Spinner, PA-C   50 mg at 11/03/18 1101  . vitamin C (ASCORBIC ACID) tablet 500 mg  500 mg Oral Daily Ainsley Spinner, PA-C      . [START ON 11/04/2018] Vitamin D (Ergocalciferol) (DRISDOL) capsule 50,000 Units  50,000 Units Oral Q7 days Ainsley Spinner, PA-C         Discharge Medications: Please see  discharge summary for a list of discharge medications.  Relevant Imaging Results:  Relevant Lab Results:   Additional Information SSN: [id number]; High falls risk; COVID test negative 10/31/18  Midge Minium RN, BSN, NCM-BC, ACM-RN [phone]

## 2018-11-03 NOTE — Plan of Care (Deleted)

## 2018-11-03 NOTE — TOC Initial Note (Addendum)
Transition of Care [facility]) - Initial/Assessment Note    Patient Details  Name: Kristen Mason MRN: [id number] Date of Birth: [birth year]  [facility]) CM/SW Contact:    Midge Minium RN, BSN, NCM-BC, ACM-RN [phone] Phone Number: 11/03/2018, 3:00 PM  Clinical Narrative:                 CM following for dispositional needs. CM spoke to the patient and her daughter, [name] to discuss the POC. Patient lives at home with her daughter who works FT throughout the day, with assistance provided by her daughter. Patient has a RW, hospital bed, BSC to assist with her ambulation/ADLs. Patient is s/p right femur fx s/p IM nail and right calcaneal fx non-op. PT/OT eval completed with SNF recommended, with the patient and her daughter agreeable to the DCP. FL2 completed and sent to [facility] as requested; awaiting bed offers. Patient may need an updated COVID test depending on the selected SNF policy (last COVID test 10/5). CM will continue to follow.   Expected Discharge Plan: Skilled Nursing Facility Barriers to Discharge: Continued Medical Work up, SNF Pending bed offer   Patient Goals and CMS Choice Patient states their goals for this hospitalization and ongoing recovery are:: "to get stronger" CMS Medicare.gov Compare Post Acute Care list provided to:: Patient Represenative (must comment)(Leslie Duwayne Heck) Choice offered to / list presented to : Adult Children([place])  Expected Discharge Plan and Services Expected Discharge Plan: [place]   Discharge Planning Services: CM Consult Post Acute Care Choice: [place] Living arrangements for the past 2 months: Apartment                 DME Arranged: N/A DME Agency: NA       HH Arranged: NA [place] Agency: NA        Prior Living Arrangements/Services Living arrangements for the past 2 months: Apartment Lives with:: Self, Adult Children Patient language and need for interpreter  reviewed:: Yes Do you feel safe going back to the place where you live?: Yes(post-rehab)      Need for Family Participation in Patient Care: Yes (Comment) Care giver support system in place?: Yes (comment) Current home services: DME Criminal Activity/Legal Involvement Pertinent to Current Situation/Hospitalization: No - Comment as needed  Activities of Daily Living Home Assistive Devices/Equipment: Walker (specify type) ADL Screening (condition at time of admission) Patient's cognitive ability adequate to safely complete daily activities?: Yes Is the patient deaf or have difficulty hearing?: No Does the patient have difficulty seeing, even when wearing glasses/contacts?: Yes Does the patient have difficulty concentrating, remembering, or making decisions?: Yes Patient able to express need for assistance with ADLs?: Yes Does the patient have difficulty dressing or bathing?: Yes Independently performs ADLs?: Yes (appropriate for developmental age) Does the patient have difficulty walking or climbing stairs?: Yes Weakness of Legs: None Weakness of Arms/Hands: None  Permission Sought/Granted Permission sought to share information with : Case Manager, Customer service manager, Family Supports Permission granted to share information with : Yes, Verbal Permission Granted  Share Information with NAME: [name]  Permission granted to share info w AGENCY: SNF facilities for placement  Permission granted to share info w Relationship: daughter  Permission granted to share info w Contact Information: [phone]  Emotional Assessment   Attitude/Demeanor/Rapport: Gracious Affect (typically observed): Pleasant, Calm, Accepting Orientation: : Oriented to Self Alcohol / Substance Use: Not Applicable Psych Involvement: No (comment)  Admission diagnosis:  Fall, initial encounter [W19.XXXA] Closed fracture  of distal end of right femur, unspecified fracture morphology, initial  encounter [facility]) [S72.401A] Patient Active Problem List   Diagnosis Date Noted  . Femur fracture, right ([place]) 10/31/2018  . Acute on chronic kidney failure ([place]) 10/31/2018  . Macrocytic anemia 10/31/2018  . Falls 07/24/2017  . COPD exacerbation ([place]) 06/16/2017  . Parkinson's disease ([place]) 06/16/2017  . Essential tremor 06/16/2017  . Peripheral neuropathy 06/16/2017  . Depression 06/16/2017  . Anxiety 06/16/2017  . Acute respiratory failure with hypoxia ([place]) 06/07/2017  . Fall at home, initial encounter 06/07/2017  . Multiple rib fractures 06/07/2017  . Controlled diabetes mellitus ([place]) 06/07/2017  . Essential hypertension 06/07/2017  . Tobacco dependence 06/07/2017   PCP:  Wenda Low, MD Pharmacy:   [place], [place] - [address] [place] [address] Phone: [phone] Fax: [phone]     Social Determinants of Health (SDOH) Interventions    Readmission Risk Interventions Readmission Risk Prevention Plan 11/03/2018  Transportation Screening Complete  PCP or Specialist Appt within 3-5 Days Complete  HRI or [place] Complete  Social Work Consult for [place] Planning/Counseling Complete  Palliative Care Screening Not Applicable  Medication Review Press photographer) Complete  Some recent data might be hidden

## 2018-11-03 NOTE — Progress Notes (Signed)
[facility] PROGRESS NOTE    Progress Note  Kristen Mason  [id number] DOB: 1934-04-09 DOA: 10/31/2018 PCP: Wenda Low, MD     Brief Narrative:   Kristen Mason is an 84 y.o. female past medical history significant for hypertension diabetes chronic kidney disease presents to the ED with right neck pain after fall.  Assessment/Plan:   Femur fracture, right (HCC) Closed right supracondylar distal femur fracture status post retrograde IMN. Unrestricted range of motion to the right knee and right hip. Dressing over wound either today or tomorrow. Awaiting physical therapy evaluation, float heels to prevent pressure ulcer.  Patient is having significant nausea vomiting avoid narcotics continue Tylenol.    Fracture/avulsion of lateral calcaneus: No intervention at this time.  Essential hypertension: KVO IV fluids.  Diabetes mellitus type 2: Hemoglobin A1c is 6.2 continue sliding scale insulin.  Acute blood loss anemia/microcytosis: Her B12 is low is being repleted.  She received 1 unit of packed red blood cells urine surgery.  Acute kidney injury: Likely prerenal azotemia resolved with IV fluid hydration in the setting of NSAID use.  Essential tremor Continue primidone.  Intractable nausea and vomiting: Scheduled Zofran increase her Protonix and change it to IV twice daily. Continue MiraLAX. Abdominal x-ray showed no ileus or small bowel obstruction. On physical exam she has positive bowel sounds nontender nondistended.   DVT prophylaxis: SCD Family Communication:daughter Disposition Plan/Barrier to D/C: SNF in am Code Status:     Code Status Orders  (From admission, onward)         Start     Ordered   10/31/18 1405  Full code  Continuous     10/31/18 1415        Code Status History    Date Active Date Inactive Code Status Order ID Comments User Context   06/07/2017 1327 06/11/2017 1604 DNR [id number]  Karmen Bongo, MD ED   Advance Care  Planning Activity    Advance Directive Documentation     Most Recent Value  Type of Advance Directive  Healthcare Power of [place]  Pre-existing out of facility DNR order (yellow form or pink MOST form)  --  "MOST" Form in Place?  --        IV Access:    Peripheral IV   Procedures and diagnostic studies:   Dg Abd 1 View  Result Date: 11/02/2018 CLINICAL DATA:  Abdominal pain. EXAM: ABDOMEN - 1 VIEW COMPARISON:  None. FINDINGS: Supine abdomen shows no gaseous bowel dilatation to suggest obstruction. Mild gaseous distention of the stomach observed. No unexpected abdominopelvic calcification. Degenerative changes noted in both hips and in the lumbar spine. IMPRESSION: Nonobstructive bowel gas pattern. Electronically Signed   By: Misty Stanley M.D.   On: 11/02/2018 19:28   Dg C-arm 1-60 Min  Result Date: 11/01/2018 CLINICAL DATA:  Right femur surgery EXAM: RIGHT FEMUR 2 VIEWS; DG C-ARM 1-60 MIN COMPARISON:  10/31/2018 FINDINGS: Five low resolution intraoperative spot views of the right femur. Total fluoroscopy time was 1 minutes 28 seconds. The images demonstrate intramedullary rod and proximal and distal screw fixation of the right femur for comminuted distal femoral fracture. IMPRESSION: Intraoperative fluoroscopic assistance provided during surgical fixation of distal femur fracture Electronically Signed   By: Donavan Foil M.D.   On: 11/01/2018 19:14   Dg Femur, Min 2 Views Right  Result Date: 11/01/2018 CLINICAL DATA:  Right femur surgery EXAM: RIGHT FEMUR 2 VIEWS; DG C-ARM 1-60 MIN COMPARISON:  10/31/2018 FINDINGS: Five low resolution intraoperative spot views  of the right femur. Total fluoroscopy time was 1 minutes 28 seconds. The images demonstrate intramedullary rod and proximal and distal screw fixation of the right femur for comminuted distal femoral fracture. IMPRESSION: Intraoperative fluoroscopic assistance provided during surgical fixation of distal femur fracture  Electronically Signed   By: Donavan Foil M.D.   On: 11/01/2018 19:14   Dg Femur Port, Min 2 Views Right  Result Date: 11/01/2018 CLINICAL DATA:  Postop EXAM: RIGHT FEMUR PORTABLE 2 VIEW COMPARISON:  11/01/2018, 10/31/2018 FINDINGS: Right femoral head projects in joint. Interval intramedullary rod and screw fixation of the right femur for comminuted distal femoral fracture with decreased anterior to posterior displacement of fracture segments. Advanced tricompartment arthritis of the knee. Lipohemarthrosis appears less apparent. IMPRESSION: Interval intramedullary rod and screw fixation of the right femur for distal femoral fracture with expected postsurgical changes. Electronically Signed   By: Donavan Foil M.D.   On: 11/01/2018 19:20     Medical Consultants:    None.  Anti-Infectives:   None  Subjective:    Kristen Mason she has vomited several times yesterday and this morning. She has not been able to tolerate her diet. She is complaining of some mild abdominal pain.  Objective:    Vitals:   11/02/18 2011 11/02/18 2013 11/03/18 0338 11/03/18 0736  BP: (!) 135/55  135/63 119/82  Pulse: 90  89 92  Resp:   18 16  Temp: 98.6 F (37 C)  99.1 F (37.3 C) 98.3 F (36.8 C)  TempSrc: Oral  Oral Oral  SpO2: 90% 99% 100% 91%  Weight:      Height:       SpO2: 91 % O2 Flow Rate (L/min): 2 L/min   Intake/Output Summary (Last 24 hours) at 11/03/2018 0955 Last data filed at 11/03/2018 0800 Gross per 24 hour  Intake 875.98 ml  Output 700 ml  Net 175.98 ml   Filed Weights   10/31/18 1120 11/01/18 1412  Weight: 70.3 kg 70.3 kg    Exam: General exam: In no acute distress. Respiratory system: Good air movement and clear to auscultation. Cardiovascular system: S1 & S2 heard, RRR.  Gastrointestinal system: Positive bowel sounds soft nontender nondistended Central nervous system: Alert and oriented. No focal neurological deficits. Extremities: No pedal edema. Skin: No  rashes, lesions or ulcers Psychiatry: Judgement and insight appear normal. Mood & affect appropriate.   Data Reviewed:    Labs: Basic Metabolic Panel: Recent Labs  Lab 10/31/18 1115 10/31/18 1445 11/01/18 0351 11/02/18 0203  NA 139  --  138 137  K 4.7  --  4.0 4.2  CL 104  --  106 102  CO2 24  --  23 21*  GLUCOSE 155*  --  117* 118*  BUN 22  --  24* 20  CREATININE 1.36*  --  1.26* 1.07*  CALCIUM 8.1*  --  7.4* 7.6*  MG  --  1.9  --  1.7  PHOS  --  3.7  --  2.9   GFR Estimated Creatinine Clearance: 40.6 mL/min (A) (by C-G formula based on SCr of 1.07 mg/dL (H)). Liver Function Tests: No results for input(s): AST, ALT, ALKPHOS, BILITOT, PROT, ALBUMIN in the last 168 hours. No results for input(s): LIPASE, AMYLASE in the last 168 hours. No results for input(s): AMMONIA in the last 168 hours. Coagulation profile No results for input(s): INR, PROTIME in the last 168 hours. COVID-19 Labs  Recent Labs    10/31/18 1445  FERRITIN 39  Lab Results  Component Value Date   [place] NEGATIVE 10/31/2018    CBC: Recent Labs  Lab 10/31/18 1115 11/01/18 0351 11/01/18 1427 11/02/18 0203 11/03/18 0242  WBC 8.0 4.7  --  5.9 6.6  NEUTROABS 6.2  --   --   --   --   HGB 9.0* 6.9* 10.1* 8.9* 8.7*  HCT 29.3* 21.3* 31.8* 27.5* 27.5*  MCV 102.8* 99.5  --  96.5 97.5  PLT 161 104*  --  109* 125*   Cardiac Enzymes: No results for input(s): CKTOTAL, CKMB, CKMBINDEX, TROPONINI in the last 168 hours. BNP (last 3 results) No results for input(s): PROBNP in the last 8760 hours. CBG: Recent Labs  Lab 11/02/18 1606 11/02/18 2005 11/03/18 0005 11/03/18 0411 11/03/18 0744  GLUCAP 146* 133* 130* 131* 131*   D-Dimer: No results for input(s): DDIMER in the last 72 hours. Hgb A1c: Recent Labs    10/31/18 1445  HGBA1C 6.2*   Lipid Profile: Recent Labs    10/31/18 1445  CHOL 156  HDL 38*  LDLCALC 82  TRIG 180*  CHOLHDL 4.1   Thyroid function studies: Recent Labs      10/31/18 1445  TSH 0.237*   Anemia work up: Recent Labs    10/31/18 1445  VITAMINB12 151*  FERRITIN 39  TIBC 260  IRON 35   Sepsis Labs: Recent Labs  Lab 10/31/18 1115 11/01/18 0351 11/02/18 0203 11/03/18 0242  WBC 8.0 4.7 5.9 6.6   Microbiology Recent Results (from the past 240 hour(s))  SARS CORONAVIRUS 2 (TAT 6-24 HRS) Nasopharyngeal Nasopharyngeal Swab     Status: None   Collection Time: 10/31/18 12:45 PM   Specimen: Nasopharyngeal Swab  Result Value Ref Range Status   SARS Coronavirus 2 NEGATIVE NEGATIVE Final    Comment: (NOTE) SARS-CoV-2 target nucleic acids are NOT DETECTED. The SARS-CoV-2 RNA is generally detectable in upper and lower respiratory specimens during the acute phase of infection. Negative results do not preclude SARS-CoV-2 infection, do not rule out co-infections with other pathogens, and should not be used as the sole basis for treatment or other patient management decisions. Negative results must be combined with clinical observations, patient history, and epidemiological information. The expected result is Negative. Fact Sheet for Patients: SugarRoll.be Fact Sheet for Healthcare Providers: https://www.woods-mathews.com/ This test is not yet approved or cleared by the [place] FDA and  has been authorized for detection and/or diagnosis of SARS-CoV-2 by FDA under an Emergency Use Authorization (EUA). This EUA will remain  in effect (meaning this test can be used) for the duration of the COVID-19 declaration under Section 56 4(b)(1) of the Act, 21 U.S.C. section 360bbb-3(b)(1), unless the authorization is terminated or revoked sooner. Performed at [facility], [place] [address] [place], [place] [address]   Surgical pcr screen     Status: Abnormal   Collection Time: 10/31/18  5:50 PM   Specimen: Nasal Mucosa; Nasal Swab  Result Value Ref Range Status   MRSA, PCR POSITIVE (A) NEGATIVE  Final    Comment: RESULT CALLED TO, READ BACK BY AND VERIFIED WITH: L COLLIE RN 1942 10/31/18 A BROWNING    Staphylococcus aureus POSITIVE (A) NEGATIVE Final    Comment: (NOTE) The Xpert SA Assay (FDA approved for NASAL specimens in patients 82 years of age and older), is one component of a comprehensive surveillance program. It is not intended to diagnose infection nor to guide or monitor treatment. Performed at [facility], [place]  [address] [place], [place] [address]      Medications:    sodium chloride   Intravenous Once   sodium chloride   Intravenous Once   acetaminophen  500 mg Oral Q8H   aspirin EC  81 mg Oral Daily   carbidopa-levodopa  1 tablet Oral TID   cholecalciferol  2,000 Units Oral BID   citalopram  20 mg Oral Daily   clonazePAM  1 mg Oral QHS   docusate sodium  100 mg Oral BID   enoxaparin (LOVENOX) injection  40 mg Subcutaneous Q24H   feeding supplement (ENSURE ENLIVE)  237 mL Oral TID BM   influenza vaccine adjuvanted  0.5 mL Intramuscular Tomorrow-1000   insulin aspart  0-15 Units Subcutaneous TID WC   insulin aspart  0-5 Units Subcutaneous QHS   mupirocin ointment   Nasal BID   pantoprazole  40 mg Oral Daily   polyethylene glycol  17 g Oral BID   primidone  250 mg Oral BID   simvastatin  10 mg Oral q1800   vitamin C  500 mg Oral Daily   [START ON 11/04/2018] Vitamin D (Ergocalciferol)  50,000 Units Oral Q7 days   Continuous Infusions:  sodium chloride 10 mL/hr at 11/02/18 1315   lactated ringers Stopped (11/02/18 1320)      LOS: 3 days   Charlynne Cousins  [facility]  11/03/2018, 9:55 AM

## 2018-11-03 NOTE — Progress Notes (Addendum)
Orthopedic Trauma Service Progress Note  Patient ID: Kristen Mason MRN: [id number] DOB/AGE: 1934/12/20 84 y.o.  Subjective:  C/o nausea again today  Did not work with therapy yesterday Appears to have a new subconjunctival hemorrhage left eye, presumably from retching   Minimal appetite   No CP or SOB  ROS As above Objective:   VITALS:   Vitals:   11/02/18 2011 11/02/18 2013 11/03/18 0338 11/03/18 0736  BP: (!) 135/55  135/63 119/82  Pulse: 90  89 92  Resp:   18 16  Temp: 98.6 F (37 C)  99.1 F (37.3 C) 98.3 F (36.8 C)  TempSrc: Oral  Oral Oral  SpO2: 90% 99% 100% 91%  Weight:      Height:        Estimated body mass index is 25.79 kg/m as calculated from the following:   Height as of this encounter: 5\' 5"  (1.651 m).   Weight as of this encounter: 70.3 kg.   Intake/Output      10/07 0701 - 10/08 0700 10/08 0701 - 10/09 0700   P.O. 720 240   I.V. (mL/kg) 106 (1.5)    Blood     IV Piggyback 50    Total Intake(mL/kg) 876 (12.5) 240 (3.4)   Urine (mL/kg/hr) 700 (0.4)    Stool     Blood     Total Output 700    Net +176 +240        Urine Occurrence 1 x      LABS  Results for orders placed or performed during the hospital encounter of 10/31/18 (from the past 24 hour(s))  Glucose, capillary     Status: Abnormal   Collection Time: 11/02/18  4:06 PM  Result Value Ref Range   Glucose-Capillary 146 (H) 70 - 99 mg/dL  Glucose, capillary     Status: Abnormal   Collection Time: 11/02/18  8:05 PM  Result Value Ref Range   Glucose-Capillary 133 (H) 70 - 99 mg/dL  Glucose, capillary     Status: Abnormal   Collection Time: 11/03/18 12:05 AM  Result Value Ref Range   Glucose-Capillary 130 (H) 70 - 99 mg/dL  CBC     Status: Abnormal   Collection Time: 11/03/18  2:42 AM  Result Value Ref Range   WBC 6.6 4.0 - 10.5 K/uL   RBC 2.82 (L) 3.87 - 5.11 MIL/uL   Hemoglobin 8.7 (L) 12.0 - 15.0  g/dL   HCT 27.5 (L) 36.0 - 46.0 %   MCV 97.5 80.0 - 100.0 fL   MCH 30.9 26.0 - 34.0 pg   MCHC 31.6 30.0 - 36.0 g/dL   RDW 16.0 (H) 11.5 - 15.5 %   Platelets 125 (L) 150 - 400 K/uL   nRBC 0.0 0.0 - 0.2 %  Glucose, capillary     Status: Abnormal   Collection Time: 11/03/18  4:11 AM  Result Value Ref Range   Glucose-Capillary 131 (H) 70 - 99 mg/dL  Glucose, capillary     Status: Abnormal   Collection Time: 11/03/18  7:44 AM  Result Value Ref Range   Glucose-Capillary 131 (H) 70 - 99 mg/dL   Results for NESTA, CLY (MRN [id number]) as of 11/03/2018 10:37  Ref. Range 11/02/2018 02:03  Vitamin D, 25-Hydroxy Latest Ref Range: 30 - 100 ng/mL 7.78 (L)  PHYSICAL EXAM:   Gen: sitting up in bed Lungs: clear anterior fields Cardiac: RRR, s1 and s2 Abd: + BS, NT Ext:       Right Lower Extremity              Dressing c/d/i              Ext warm              + DP pulse             Ankle flexion, extension, inversion and eversion intact             No pain with passive stretching              Sensation at baseline   Assessment/Plan: 2 Days Post-Op   Principal Problem:   Femur fracture, right (HCC) Active Problems:   Controlled diabetes mellitus (HCC)   Essential hypertension   Essential tremor   Acute on chronic kidney failure (HCC)   Macrocytic anemia   Anti-infectives (From admission, onward)   Start     Dose/Rate Route Frequency Ordered Stop   11/01/18 2330  ceFAZolin (ANCEF) IVPB 1 g/50 mL premix     1 g 100 mL/hr over 30 Minutes Intravenous Every 8 hours 11/01/18 2029 11/02/18 1624   11/01/18 1400  ceFAZolin (ANCEF) IVPB 2g/100 mL premix     2 g 200 mL/hr over 30 Minutes Intravenous  Once 11/01/18 1350 11/01/18 1533    .  POD/HD#: 2  84 y/o female s/p ground level fall with R supracondylar distal femur fracture    -fall   - closed R supracondylar distal femur fracture s/p retrograde IMN              TDWB R leg             Unrestricted ROM R knee and hip  (at baseline she is restricted due to advanced endstage DJD)             PT/OT evals             Dressing change tomorrow             Ice and elevate              Float heels off bed to prevent pressure sores and to keep knee in full extension when at rest so as to not exacerbate her contractures any further   No pillows under knee- place under ankle to keep knee in full extension when at rest                 - Pain management:            scheduled tylenol              dc norco  Restart home norco   - ABL anemia/Hemodynamics            stable this am              Cbc in am    - Medical issues              Per medical team    Refractory nausea    Per medical team     protonix BID    zofran     Possible component of constipation. Last BM 10/31/2018. States she has a BM daily      Dulcolax suppository ordered     miralax BID  Diabetes                          On SSI    - DVT/PE prophylaxis:             Lovenox x 4 weeks    - ID:              periop abx    - Metabolic Bone Disease:             profound vitamin d deficiency    Start vitamin d3 2000 IU BID   Also start vitamin d2 50000 weekly    Vitamin c    Awaiting PTH and calcitriol levels to better eval calcium status               Will need DEXA in 4-8 weeks             Fracture indicative of osteoporosis                          Will likely need pharmacologic treatment of her osteoporosis                          Would strongly favor starting treatment of her osteoporosis before considering Total joint replacement    - Activity:             TDWB R leg    - FEN/GI prophylaxis/Foley/Lines:             carb mod diet, assistance with feeding due to tremors             One time low dose zofran now for nausea, no reglan or compazine due to increased risk of extrapyramidal side effects   - Impediments to fracture healing:             Osteoporosis             DM             Falls/balance issues              Psychotropic medication    - Dispo:             Therapy evals             will need SNF   Jari Pigg, PA-C [phone] (C) 11/03/2018, 9:21 AM  [facility] [place] [address] [phone] Domingo Sep (F)

## 2018-11-03 NOTE — Progress Notes (Signed)
Patient complaining of abdominal pain & not having much urine output. 169mL throughout night. Bladder scan completed with 515mL. I&O completed with output of 762mL. Patient states that she feels some relieve.

## 2018-11-04 DIAGNOSIS — R109 Unspecified abdominal pain: Secondary | ICD-10-CM

## 2018-11-04 LAB — BASIC METABOLIC PANEL
Anion gap: 16 — ABNORMAL HIGH (ref 5–15)
BUN: 18 mg/dL (ref 8–23)
CO2: 19 mmol/L — ABNORMAL LOW (ref 22–32)
Calcium: 7.9 mg/dL — ABNORMAL LOW (ref 8.9–10.3)
Chloride: 101 mmol/L (ref 98–111)
Creatinine, Ser: 0.93 mg/dL (ref 0.44–1.00)
GFR calc Af Amer: >60 mL/min (ref >60)
GFR calc non Af Amer: 58 mL/min — ABNORMAL LOW (ref >60)
Glucose, Bld: 145 mg/dL — ABNORMAL HIGH (ref 70–99)
Potassium: 4 mmol/L (ref 3.5–5.1)
Sodium: 136 mmol/L (ref 135–145)

## 2018-11-04 LAB — CBC
HCT: 23.1 % — ABNORMAL LOW (ref 36.0–46.0)
Hemoglobin: 8 g/dL — ABNORMAL LOW (ref 12.0–15.0)
MCH: 32.9 pg (ref 26.0–34.0)
MCHC: 34.6 g/dL (ref 30.0–36.0)
MCV: 95.1 fL (ref 80.0–100.0)
Platelets: 161 10*3/uL (ref 150–400)
RBC: 2.43 MIL/uL — ABNORMAL LOW (ref 3.87–5.11)
RDW: 16 % — ABNORMAL HIGH (ref 11.5–15.5)
WBC: 5 10*3/uL (ref 4.0–10.5)
nRBC: 0 % (ref 0.0–0.2)

## 2018-11-04 LAB — BLOOD GAS, VENOUS
Acid-Base Excess: 0.7 mmol/L (ref 0.0–2.0)
Bicarbonate: 24.3 mmol/L (ref 20.0–28.0)
O2 Saturation: 89.8 %
Patient temperature: 98.6
pCO2, Ven: 35.8 mmHg — ABNORMAL LOW (ref 44.0–60.0)
pH, Ven: 7.447 — ABNORMAL HIGH (ref 7.250–7.430)
pO2, Ven: 59.1 mmHg — ABNORMAL HIGH (ref 32.0–45.0)

## 2018-11-04 LAB — GLUCOSE, CAPILLARY
Glucose-Capillary: 126 mg/dL — ABNORMAL HIGH (ref 70–99)
Glucose-Capillary: 132 mg/dL — ABNORMAL HIGH (ref 70–99)
Glucose-Capillary: 142 mg/dL — ABNORMAL HIGH (ref 70–99)
Glucose-Capillary: 134 mg/dL — ABNORMAL HIGH (ref 70–99)
Glucose-Capillary: 137 mg/dL — ABNORMAL HIGH (ref 70–99)

## 2018-11-04 LAB — LACTIC ACID, PLASMA: Lactic Acid, Venous: 1.5 mmol/L (ref 0.5–1.9)

## 2018-11-04 MED ORDER — CYANOCOBALAMIN 1000 MCG/ML IJ SOLN
1000.0000 ug | Freq: Every day | INTRAMUSCULAR | Status: DC
  Administered 2018-11-04 – 2018-11-07 (×4): 1000 ug via INTRAMUSCULAR
  Filled 2018-11-04 (×5): qty 1

## 2018-11-04 MED ORDER — VITAMIN D3 25 MCG PO TABS: 2000.0000 [IU] | ORAL_TABLET | Freq: Two times a day (BID) | ORAL | 2 refills | Status: DC

## 2018-11-04 MED ORDER — CALCIUM CITRATE 950 (200 CA) MG PO TABS: 200.0000 mg | ORAL_TABLET | Freq: Two times a day (BID) | ORAL | 2 refills | Status: DC

## 2018-11-04 MED ORDER — CALCIUM CITRATE 950 (200 CA) MG PO TABS
200.0000 mg | ORAL_TABLET | Freq: Two times a day (BID) | ORAL | Status: DC
  Administered 2018-11-04 – 2018-11-10 (×12): 200 mg via ORAL
  Filled 2018-11-04 (×16): qty 1

## 2018-11-04 MED ORDER — AMLODIPINE BESYLATE 5 MG PO TABS
5.0000 mg | ORAL_TABLET | Freq: Every day | ORAL | Status: DC
  Administered 2018-11-05 – 2018-11-10 (×6): 5 mg via ORAL
  Filled 2018-11-04 (×6): qty 1

## 2018-11-04 MED ORDER — FERROUS SULFATE 325 (65 FE) MG PO TABS
325.0000 mg | ORAL_TABLET | Freq: Every day | ORAL | Status: DC
  Administered 2018-11-05 – 2018-11-10 (×6): 325 mg via ORAL
  Filled 2018-11-04 (×6): qty 1

## 2018-11-04 MED ORDER — ASCORBIC ACID 500 MG PO TABS: 500.0000 mg | ORAL_TABLET | Freq: Every day | ORAL | 1 refills | Status: DC

## 2018-11-04 MED ORDER — VITAMIN D (ERGOCALCIFEROL) 1.25 MG (50000 UNIT) PO CAPS: 50000.0000 [IU] | ORAL_CAPSULE | ORAL | 0 refills | Status: DC

## 2018-11-04 MED ORDER — HYPROMELLOSE (GONIOSCOPIC) 2.5 % OP SOLN
1.0000 [drp] | Freq: Four times a day (QID) | OPHTHALMIC | Status: DC
  Administered 2018-11-04 – 2018-11-10 (×24): 1 [drp] via OPHTHALMIC
  Filled 2018-11-04: qty 15

## 2018-11-04 NOTE — Consult Note (Signed)
   [facility] CM Inpatient Consult   11/04/2018  Formeka Harbach 1934-04-18 [id number]     Patientwasscreened for potential [facility] Care Management services for 22%high risk score of unplanned readmission and hospitalization under her Medicare/ Next[employer] benefits.   Patient was actively engaged by [facility] care management coordinators for transition of care and care coordination in the distant past.  Per chart reviewandMD brief narrative reveal as follows:  Jacquetta Depp is an 84 y.o. female, past medical history significant for hypertension diabetes, chronic kidney disease   presents to the ED with right neck pain after a fall, sustained closed right supracondylar distal femur fracture status post retrograde IMN (intramedullary nailing- right). Patient lives with daughter and uses walker for ambulation.   Review of PT/ OTcurrent recommendations, indicate skilled nursing facility for rehab prior to return home.   Transition of care CM note shows that discharge plan to skilled nursing facility and patient accepted the ST-SNF bed offer at [place] [facility] (not under [facility] coverage).  Plan: Continue to follow progress and disposition.  If disposition needs change, and there are community follow-up neededfrom [facility] Care Management, please refer.  Of note, [facility] Care Management services does not replace or interfere with any services that are arranged bytransition of carecase management or social work.    For questionsand additional information,please contact:   Dania Marsan A. Kiffany Schelling, BSN, RN-BC [place] (covering for [place] [place]) Cell: [phone]

## 2018-11-04 NOTE — Progress Notes (Addendum)
Orthopedic Trauma Service Progress Note  Patient ID: Kristen Mason MRN: [id number] DOB/AGE: March 11, 1934 84 y.o.  Subjective:  Looks better today  + BM yesterday, no nausea today   C/o some R eye pain   Did work with PT/OT yesterday   ROS As above  Objective:   VITALS:   Vitals:   11/03/18 1417 11/03/18 1920 11/04/18 0317 11/04/18 0755  BP: (!) 154/72 (!) 151/67 (!) 153/70 (!) 150/89  Pulse: 96 90 90 83  Resp: 16 17  16   Temp: 97.7 F (36.5 C) 98.2 F (36.8 C) (!) 97.4 F (36.3 C) 97.8 F (36.6 C)  TempSrc: Oral Oral Oral Oral  SpO2: 93% 94% (!) 89% 92%  Weight:      Height:        Estimated body mass index is 25.79 kg/m as calculated from the following:   Height as of this encounter: 5\' 5"  (1.651 m).   Weight as of this encounter: 70.3 kg.   Intake/Output      10/08 0701 - 10/09 0700 10/09 0701 - 10/10 0700   P.O. 960    I.V. (mL/kg) 1249.5 (17.8)    IV Piggyback     Total Intake(mL/kg) 2209.5 (31.4)    Urine (mL/kg/hr)     Total Output     Net +2209.5         Urine Occurrence 5 x    Stool Occurrence 3 x      LABS  Results for orders placed or performed during the hospital encounter of 10/31/18 (from the past 24 hour(s))  Glucose, capillary     Status: Abnormal   Collection Time: 11/03/18 12:22 PM  Result Value Ref Range   Glucose-Capillary 132 (H) 70 - 99 mg/dL  Glucose, capillary     Status: Abnormal   Collection Time: 11/03/18  3:58 PM  Result Value Ref Range   Glucose-Capillary 133 (H) 70 - 99 mg/dL  Glucose, capillary     Status: Abnormal   Collection Time: 11/03/18  9:05 PM  Result Value Ref Range   Glucose-Capillary 123 (H) 70 - 99 mg/dL  CBC     Status: Abnormal   Collection Time: 11/04/18  1:54 AM  Result Value Ref Range   WBC 5.0 4.0 - 10.5 K/uL   RBC 2.43 (L) 3.87 - 5.11 MIL/uL   Hemoglobin 8.0 (L) 12.0 - 15.0 g/dL   HCT 23.1 (L) 36.0 - 46.0 %   MCV 95.1  80.0 - 100.0 fL   MCH 32.9 26.0 - 34.0 pg   MCHC 34.6 30.0 - 36.0 g/dL   RDW 16.0 (H) 11.5 - 15.5 %   Platelets 161 150 - 400 K/uL   nRBC 0.0 0.0 - 0.2 %  Basic metabolic panel     Status: Abnormal   Collection Time: 11/04/18  1:54 AM  Result Value Ref Range   Sodium 136 135 - 145 mmol/L   Potassium 4.0 3.5 - 5.1 mmol/L   Chloride 101 98 - 111 mmol/L   CO2 19 (L) 22 - 32 mmol/L   Glucose, Bld 145 (H) 70 - 99 mg/dL   BUN 18 8 - 23 mg/dL   Creatinine, Ser 0.93 0.44 - 1.00 mg/dL   Calcium 7.9 (L) 8.9 - 10.3 mg/dL   GFR calc non Af Amer 58 (L) >60  mL/min   GFR calc Af Amer >60 >60 mL/min   Anion gap 16 (H) 5 - 15  Glucose, capillary     Status: Abnormal   Collection Time: 11/04/18  6:30 AM  Result Value Ref Range   Glucose-Capillary 126 (H) 70 - 99 mg/dL  Glucose, capillary     Status: Abnormal   Collection Time: 11/04/18  7:58 AM  Result Value Ref Range   Glucose-Capillary 132 (H) 70 - 99 mg/dL  Lactic acid, plasma     Status: None   Collection Time: 11/04/18  8:47 AM  Result Value Ref Range   Lactic Acid, Venous 1.5 0.5 - 1.9 mmol/L   Results for GABRELLA, FREKING (MRN [id number]) as of 11/04/2018 10:06  Ref. Range 11/02/2018 02:03  Vit D, 1,25-Dihydroxy Latest Ref Range: 19.9 - 79.3 pg/mL 9.4 (L)  Vitamin D, 25-Hydroxy Latest Ref Range: 30 - 100 ng/mL 7.78 (L)    Results for HAELI, VOIGT (MRN [id number]) as of 11/04/2018 10:06  Ref. Range 11/02/2018 02:03  PTH, Intact Latest Ref Range: 15 - 65 pg/mL 59  Calcium, Total (PTH) Latest Ref Range: 8.7 - 10.3 mg/dL 7.6 (L)    PHYSICAL EXAM:   Gen: awake, sitting up in bed  Lungs: unlabored Cardiac: regular  Ext:       Right Lower Extremity   Dressing removed  Swelling well controlled  All incisions look great   No drainage   No erythema   Distal motor and sensory functions grossly intact   No DCT   Compartments are soft   Assessment/Plan: 3 Days Post-Op   Principal Problem:   Femur fracture, right (HCC) Active  Problems:   Controlled diabetes mellitus (HCC)   Essential hypertension   Essential tremor   Acute on chronic kidney failure (HCC)   Macrocytic anemia   Anti-infectives (From admission, onward)   Start     Dose/Rate Route Frequency Ordered Stop   11/01/18 2330  ceFAZolin (ANCEF) IVPB 1 g/50 mL premix     1 g 100 mL/hr over 30 Minutes Intravenous Every 8 hours 11/01/18 2029 11/02/18 1624   11/01/18 1400  ceFAZolin (ANCEF) IVPB 2g/100 mL premix     2 g 200 mL/hr over 30 Minutes Intravenous  Once 11/01/18 1350 11/01/18 1533    .  POD/HD#: 53  84 y/o female s/p ground level fall with R supracondylar distal femur fracture    -fall   - closed R supracondylar distal femur fracture s/p retrograde IMN              TDWB R leg             Unrestricted ROM R knee and hip (at baseline she is restricted due to advanced endstage DJD)             PT/OT             Dressing changed today   Ok to change dressing as needed   Ok to clean with soap and water              Ice and elevate              Float heels off bed to prevent pressure sores and to keep knee in full extension when at rest so as to not exacerbate her contractures any further              No pillows under knee- place under ankle to keep knee in full extension when at  rest   TED hose                 - Pain management:            scheduled tylenol              dc norco             ultram    - ABL anemia/Hemodynamics                         Cbc in am    - Medical issues              Per medical team                          Refractory nausea                                     appears improved    Monitor     Continue bowel regimen              Diabetes                          On SSI    - DVT/PE prophylaxis:             Lovenox x 4 weeks    - ID:              periop abx completed    - Metabolic Bone Disease:             profound vitamin d deficiency                          Start vitamin d3 2000 IU BID                          Also start vitamin d2 50000 weekly                          Vitamin c    Calcitriol levels suggest long standing vitamin d deficiency and along with PTH levels suggest calcium deficiency                                        Will need DEXA in 4-8 weeks             Fracture indicative of osteoporosis                          Will likely need pharmacologic treatment of her osteoporosis                          Would strongly favor starting treatment of her osteoporosis before considering Total joint replacement    - Activity:             TDWB R leg    - FEN/GI prophylaxis/Foley/Lines:             carb mod diet, assistance with feeding due to tremors                -  Impediments to fracture healing:             Osteoporosis             DM             Falls/balance issues             Psychotropic medication    - Dispo:             Therapy             will need SNF   Ortho issues improving      Jari Pigg, PA-C [phone] (C) 11/04/2018, 10:00 AM  [facility] [place] [id number] [phone] (F)

## 2018-11-04 NOTE — TOC Progression Note (Signed)
Transition of Care [facility]) - Progression Note    Patient Details  Name: Kristen Mason MRN: [id number] Date of Birth: 1934/11/04  [facility] CM/SW [place], RN Phone Number: 11/04/2018, 3:51 PM  Clinical Narrative:    Patient/daughter has accepted the ST SNF bed offer at [place] at [place]. CM updated Tammy (facility liaison) with a bed available on 11/05/18 (pending COVID results). CM has updated Dr. Florene Glen with a COVID test to be ordered. PTAR will be arranged.   Expected Discharge Plan: [place] Barriers to Discharge: Continued Medical Work up, SNF Pending bed offer  Expected Discharge Plan and Services Expected Discharge Plan: [place]   Discharge Planning Services: CM Consult Post Acute Care Choice: [place] Living arrangements for the past 2 months: Apartment                 DME Arranged: N/A DME Agency: NA       HH Arranged: NA HH Agency: NA         Social Determinants of Health (SDOH) Interventions    Readmission Risk Interventions Readmission Risk Prevention Plan 11/03/2018  Transportation Screening Complete  PCP or Specialist Appt within 3-5 Days Complete  HRI or [place] Complete  Social Work Consult for [place] Planning/Counseling Complete  Palliative Care Screening Not Applicable  Medication Review Press photographer) Complete  Some recent data might be hidden

## 2018-11-04 NOTE — Progress Notes (Signed)
Hemoglobin 8.0 this morning, decrease 8.7 yesterday  Patient is without complaints, no bleeding, vitals are stable.  [facility] on-call alerted via text page with results.

## 2018-11-04 NOTE — Discharge Instructions (Addendum)
Orthopa[facility] Trauma Service Discharge Instructions   General Discharge Instructions  WEIGHT BEARING STATUS: Touchdown Weightbearing Right Lower Extremity   RANGE OF MOTION/ACTIVITY: unrestricted range of motion right knee and hip   Bone health:  Your labs show significant vitamin d deficiency. Continue with vitamin d, vitamin C and calcium supplementation.  You will need a bone density scan in the next 4-8 weeks   Wound Care: daily wound care starting upon discharge. Ok to leave operative wounds open to air once dry.  See instructions below  Discharge Wound Care Instructions  Do NOT apply any ointments, solutions or lotions to pin sites or surgical wounds.  These prevent needed drainage and even though solutions like hydrogen peroxide kill bacteria, they also damage cells lining the pin sites that help fight infection.  Applying lotions or ointments can keep the wounds moist and can cause them to breakdown and open up as well. This can increase the risk for infection. When in doubt call the office.  Surgical incisions should be dressed daily.  If any drainage is noted, use one layer of adaptic, then gauze, Kerlix, and an ace wrap.  Once the incision is completely dry and without drainage, it may be left open to air out.  Showering may begin 36-48 hours later.  Cleaning gently with soap and water.  Traumatic wounds should be dressed daily as well.    One layer of adaptic, gauze, Kerlix, then ace wrap.  The adaptic can be discontinued once the draining has ceased    If you have a wet to dry dressing: wet the gauze with saline the squeeze as much saline out so the gauze is moist (not soaking wet), place moistened gauze over wound, then place a dry gauze over the moist one, followed by Kerlix wrap, then ace wrap.   Diet: as you were eating previously.  Can use over the counter stool softeners and bowel preparations, such as Miralax, to help with bowel movements.  Narcotics can be  constipating.  Be sure to drink plenty of fluids  PAIN MEDICATION USE AND EXPECTATIONS  You have likely been given narcotic medications to help control your pain.  After a traumatic event that results in an fracture (broken bone) with or without surgery, it is ok to use narcotic pain medications to help control one's pain.  We understand that everyone responds to pain differently and each individual patient will be evaluated on a regular basis for the continued need for narcotic medications. Ideally, narcotic medication use should last no more than 6-8 weeks (coinciding with fracture healing).   As a patient it is your responsibility as well to monitor narcotic medication use and report the amount and frequency you use these medications when you come to your office visit.   We would also advise that if you are using narcotic medications, you should take a dose prior to therapy to maximize you participation.  IF YOU ARE ON NARCOTIC MEDICATIONS IT IS NOT PERMISSIBLE TO OPERATE A MOTOR VEHICLE (MOTORCYCLE/CAR/TRUCK/MOPED) OR HEAVY MACHINERY DO NOT MIX NARCOTICS WITH OTHER CNS (CENTRAL NERVOUS SYSTEM) DEPRESSANTS SUCH AS ALCOHOL   STOP SMOKING OR USING NICOTINE PRODUCTS!!!!  As discussed nicotine severely impairs your body's ability to heal surgical and traumatic wounds but also impairs bone healing.  Wounds and bone heal by forming microscopic blood vessels (angiogenesis) and nicotine is a vasoconstrictor (essentially, shrinks blood vessels).  Therefore, if vasoconstriction occurs to these microscopic blood vessels they essentially disappear and are unable to deliver  necessary nutrients to the healing tissue.  This is one modifiable factor that you can do to dramatically increase your chances of healing your injury.    (This means no smoking, no nicotine gum, patches, etc)  DO NOT USE NONSTEROIDAL ANTI-INFLAMMATORY DRUGS (NSAID'S)  Using products such as Advil (ibuprofen), Aleve (naproxen), Motrin  (ibuprofen) for additional pain control during fracture healing can delay and/or prevent the healing response.  If you would like to take over the counter (OTC) medication, Tylenol (acetaminophen) is ok.  However, some narcotic medications that are given for pain control contain acetaminophen as well. Therefore, you should not exceed more than 4000 mg of tylenol in a day if you do not have liver disease.  Also note that there are may OTC medicines, such as cold medicines and allergy medicines that my contain tylenol as well.  If you have any questions about medications and/or interactions please ask your doctor/PA or your pharmacist.      ICE AND ELEVATE INJURED/OPERATIVE EXTREMITY  Using ice and elevating the injured extremity above your heart can help with swelling and pain control.  Icing in a pulsatile fashion, such as 20 minutes on and 20 minutes off, can be followed.    Do not place ice directly on skin. Make sure there is a barrier between to skin and the ice pack.    Using frozen items such as frozen peas works well as the conform nicely to the are that needs to be iced.  USE AN ACE WRAP OR TED HOSE FOR SWELLING CONTROL  In addition to icing and elevation, Ace wraps or TED hose are used to help limit and resolve swelling.  It is recommended to use Ace wraps or TED hose until you are informed to stop.    When using Ace Wraps start the wrapping distally (farthest away from the body) and wrap proximally (closer to the body)   Example: If you had surgery on your leg or thing and you do not have a splint on, start the ace wrap at the toes and work your way up to the thigh        If you had surgery on your upper extremity and do not have a splint on, start the ace wrap at your fingers and work your way up to the upper arm  IF YOU ARE IN A SPLINT OR CAST DO NOT [place]   If your splint gets wet for any reason please contact the office immediately. You may shower in your splint or  cast as long as you keep it dry.  This can be done by wrapping in a cast cover or garbage back (or similar)  Do Not stick any thing down your splint or cast such as pencils, money, or hangers to try and scratch yourself with.  If you feel itchy take benadryl as prescribed on the bottle for itching  IF YOU ARE IN A CAM BOOT (BLACK BOOT)  You may remove boot periodically. Perform daily dressing changes as noted below.  Wash the liner of the boot regularly and wear a sock when wearing the boot. It is recommended that you sleep in the boot until told otherwise    Call office for the following:  Temperature greater than 101F  Persistent nausea and vomiting  Severe uncontrolled pain  Redness, tenderness, or signs of infection (pain, swelling, redness, odor or green/yellow discharge around the site)  Difficulty breathing, headache or visual disturbances  Hives  Persistent  dizziness or light-headedness  Extreme fatigue  Any other questions or concerns you may have after discharge  In an emergency, call 911 or go to an Emergency Department at a nearby hospital    [place]: [place] INFORMATION: [email or web address]     Information on my medicine - ELIQUIS (apixaban)  This medication education was reviewed with me or my healthcare representative as part of my discharge preparation.   Why was Eliquis prescribed for you? Eliquis was prescribed for you to reduce the risk of a blood clot forming that can cause a stroke if you have a medical condition called atrial fibrillation (a type of irregular heartbeat).  What do You need to know about Eliquis ? Take your Eliquis TWICE DAILY - one tablet in the morning and one tablet in the evening with or without food. If you have difficulty swallowing the tablet whole please discuss with your pharmacist how to take the medication safely.  Take Eliquis exactly as  prescribed by your doctor and DO NOT stop taking Eliquis without talking to the doctor who prescribed the medication.  Stopping may increase your risk of developing a stroke.  Refill your prescription before you run out.  After discharge, you should have regular check-up appointments with your healthcare provider that is prescribing your Eliquis.  In the future your dose may need to be changed if your kidney function or weight changes by a significant amount or as you get older.  What do you do if you miss a dose? If you miss a dose, take it as soon as you remember on the same day and resume taking twice daily.  Do not take more than one dose of ELIQUIS at the same time to make up a missed dose.  Important Safety Information A possible side effect of Eliquis is bleeding. You should call your healthcare provider right away if you experience any of the following: ? Bleeding from an injury or your nose that does not stop. ? Unusual colored urine (red or dark brown) or unusual colored stools (red or black). ? Unusual bruising for unknown reasons. ? A serious fall or if you hit your head (even if there is no bleeding).  Some medicines may interact with Eliquis and might increase your risk of bleeding or clotting while on Eliquis. To help avoid this, consult your healthcare provider or pharmacist prior to using any new prescription or non-prescription medications, including herbals, vitamins, non-steroidal anti-inflammatory drugs (NSAIDs) and supplements.  This website has more information on Eliquis (apixaban): http://www.eliquis.com/eliquis/home

## 2018-11-04 NOTE — Plan of Care (Signed)
  Problem: Pain Managment: Goal: General experience of comfort will improve Outcome: Progressing   

## 2018-11-04 NOTE — Progress Notes (Signed)
PROGRESS NOTE    Kristen Mason  [id number] DOB: August 06, 1934 DOA: 10/31/2018 PCP: Wenda Low, MD   Brief Narrative:  Kristen Mason is an 84 y.o. female with medical history significant ofhypertension, diabetes, GERD, chronic kidney disease presents to emergency department with right leg pain following a fall.  Found to have distal R femoral fracture.  Now s/p IM nail. Plan for SNF hopefully 10/10.     Assessment & Plan:   Principal Problem:   Femur fracture, right (HCC) Active Problems:   Controlled diabetes mellitus ([place])   Essential hypertension   Essential tremor   Acute on chronic kidney failure (HCC)   Macrocytic anemia   Femur fracture, right (HCC) Closed right supracondylar distal femur fracture status post retrograde IMN. Unrestricted range of motion to the right knee and right hip, touch down weight bearing to R leg Dressing over wound either today or tomorrow. Awaiting physical therapy evaluation, float heels to prevent pressure ulcer.  Keep knee in full extension when at rest.   Ortho c/s, appreciate recs Pt with nausea, continue APAP      Fracture/avulsion of lateral calcaneus: No intervention at this time.  Essential hypertension: Restart amlodipine Continue to hold lisinopril  Diabetes mellitus type 2: Hemoglobin A1c is 6.2 continue sliding scale insulin.  Acute blood loss anemia/microcytosis: Continue IM vitamin b12 Iron/ferritin borderline, will start iron supplementation Continue to follow  Acute kidney injury: Likely prerenal azotemia resolved with IV fluid hydration in the setting of NSAID use.  AGMA Mild, lactate negative and VBG without acidemia (7.44/35.8), continue to monitor  Essential tremor Continue primidone.  Intractable nausea and vomiting: Scheduled Zofran increase her Protonix and change it to IV twice daily. Continue MiraLAX. Abdominal x-ray showed no ileus or small bowel obstruction. On physical exam she has  positive bowel sounds nontender nondistended. Continue to monitor  Eye Pain  Itching  Subconjunctival Hemorrhage to L Eye:  Not sure when this occurred? Maybe in OR?  She c/o some eye pain this AM to ortho PA.  When I discussed this with her, she noted itching and that her R eye was bothering her and she thought she may have poked it? With itching, consider dry eye, allergic conjunctivitis.  Start artificial tears.  She has intact visual fields on my exam, could consider corneal abrasion as well?  Follow with artificial tears.  Attempted to discuss with ophtho today, but was unable to reach.  Would follow how she does with artificial tears and consider conversation with ophtho tomorrow if persistent sx.  DVT prophylaxis: (Lovenox/Heparin/SCD's/anticoagulated/None (if comfort care) Code Status: full  Family Communication: daughter at bedside Disposition Plan: hopefully d/c to SNF tomorrow  Consultants:   orthopedics  Procedures:  10/6 INTRAMEDULLARY (IM) RETROGRADE FEMORAL NAILING (Right) WITH BIOMET [place] 10.5 X 380 MM STATICALLY LOCKED  Antimicrobials:  Anti-infectives (From admission, onward)   Start     Dose/Rate Route Frequency Ordered Stop   11/01/18 2330  ceFAZolin (ANCEF) IVPB 1 g/50 mL premix     1 g 100 mL/hr over 30 Minutes Intravenous Every 8 hours 11/01/18 2029 11/02/18 1624   11/01/18 1400  ceFAZolin (ANCEF) IVPB 2g/100 mL premix     2 g 200 mL/hr over 30 Minutes Intravenous  Once 11/01/18 1350 11/01/18 1533         Subjective: C/o some nausea and general malaise. Eyes itching.  And eye pain.    Objective: Vitals:   11/03/18 1920 11/04/18 0317 11/04/18 0755 11/04/18 1454  BP: (!) 151/67 Marland Kitchen)  153/70 (!) 150/89 (!) 156/77  Pulse: 90 90 83 74  Resp: 17  16 16   Temp: 98.2 F (36.8 C) (!) 97.4 F (36.3 C) 97.8 F (36.6 C) (!) 97.5 F (36.4 C)  TempSrc: Oral Oral Oral Oral  SpO2: 94% (!) 89% 92% 90%  Weight:      Height:        Intake/Output Summary  (Last 24 hours) at 11/04/2018 1753 Last data filed at 11/04/2018 1300 Gross per 24 hour  Intake 1969.53 ml  Output 800 ml  Net 1169.53 ml   Filed Weights   10/31/18 1120 11/01/18 1412  Weight: 70.3 kg 70.3 kg    Examination:  General exam: Appears calm and comfortable  HEENT: subconjunctival hemorrhage to L eye Respiratory system: Clear to auscultation. Respiratory effort normal. Cardiovascular system: S1 & S2 heard, RRR. Gastrointestinal system: Abdomen is nondistended, soft and nontender.  Central nervous system: Alert and oriented. No focal neurological deficits.  EOMI.  Visual fields intact. Extremities: RLE with intact dressings Skin: No rashes, lesions or ulcers Psychiatry: Judgement and insight appear normal. Mood & affect appropriate.     Data Reviewed: I have personally reviewed following labs and imaging studies  CBC: Recent Labs  Lab 10/31/18 1115 11/01/18 0351 11/01/18 1427 11/02/18 0203 11/03/18 0242 11/04/18 0154  WBC 8.0 4.7  --  5.9 6.6 5.0  NEUTROABS 6.2  --   --   --   --   --   HGB 9.0* 6.9* 10.1* 8.9* 8.7* 8.0*  HCT 29.3* 21.3* 31.8* 27.5* 27.5* 23.1*  MCV 102.8* 99.5  --  96.5 97.5 95.1  PLT 161 104*  --  109* 125* [id number]   Basic Metabolic Panel: Recent Labs  Lab 10/31/18 1115 10/31/18 1445 11/01/18 0351 11/02/18 0203 11/04/18 0154  NA 139  --  138 137 136  K 4.7  --  4.0 4.2 4.0  CL 104  --  106 102 101  CO2 24  --  23 21* 19*  GLUCOSE 155*  --  117* 118* 145*  BUN 22  --  24* 20 18  CREATININE 1.36*  --  1.26* 1.07* 0.93  CALCIUM 8.1*  --  7.4* 7.6*  7.6* 7.9*  MG  --  1.9  --  1.7  --   PHOS  --  3.7  --  2.9  --    GFR: Estimated Creatinine Clearance: 46.7 mL/min (by C-G formula based on SCr of 0.93 mg/dL). Liver Function Tests: No results for input(s): AST, ALT, ALKPHOS, BILITOT, PROT, ALBUMIN in the last 168 hours. No results for input(s): LIPASE, AMYLASE in the last 168 hours. No results for input(s): AMMONIA in the last 168  hours. Coagulation Profile: No results for input(s): INR, PROTIME in the last 168 hours. Cardiac Enzymes: No results for input(s): CKTOTAL, CKMB, CKMBINDEX, TROPONINI in the last 168 hours. BNP (last 3 results) No results for input(s): PROBNP in the last 8760 hours. HbA1C: No results for input(s): HGBA1C in the last 72 hours. CBG: Recent Labs  Lab 11/03/18 2105 11/04/18 0630 11/04/18 0758 11/04/18 1121 11/04/18 1629  GLUCAP 123* 126* 132* 142* 134*   Lipid Profile: No results for input(s): CHOL, HDL, LDLCALC, TRIG, CHOLHDL, LDLDIRECT in the last 72 hours. Thyroid Function Tests: Recent Labs    11/02/18 0203  FREET4 1.00   Anemia Panel: No results for input(s): VITAMINB12, FOLATE, FERRITIN, TIBC, IRON, RETICCTPCT in the last 72 hours. Sepsis Labs: Recent Labs  Lab 11/04/18 0847  LATICACIDVEN  1.5    Recent Results (from the past 240 hour(s))  SARS CORONAVIRUS 2 (TAT 6-24 HRS) Nasopharyngeal Nasopharyngeal Swab     Status: None   Collection Time: 10/31/18 12:45 PM   Specimen: Nasopharyngeal Swab  Result Value Ref Range Status   SARS Coronavirus 2 NEGATIVE NEGATIVE Final    Comment: (NOTE) SARS-CoV-2 target nucleic acids are NOT DETECTED. The SARS-CoV-2 RNA is generally detectable in upper and lower respiratory specimens during the acute phase of infection. Negative results do not preclude SARS-CoV-2 infection, do not rule out co-infections with other pathogens, and should not be used as the sole basis for treatment or other patient management decisions. Negative results must be combined with clinical observations, patient history, and epidemiological information. The expected result is Negative. Fact Sheet for Patients: SugarRoll.be Fact Sheet for Healthcare Providers: https://www.woods-mathews.com/ This test is not yet approved or cleared by the [place] FDA and  has been authorized for detection and/or diagnosis of  SARS-CoV-2 by FDA under an Emergency Use Authorization (EUA). This EUA will remain  in effect (meaning this test can be used) for the duration of the COVID-19 declaration under Section 56 4(b)(1) of the Act, 21 U.S.C. section 360bbb-3(b)(1), unless the authorization is terminated or revoked sooner. Performed at [facility], [place] [address] [place], [place] [address]   Surgical pcr screen     Status: Abnormal   Collection Time: 10/31/18  5:50 PM   Specimen: Nasal Mucosa; Nasal Swab  Result Value Ref Range Status   MRSA, PCR POSITIVE (A) NEGATIVE Final    Comment: RESULT CALLED TO, READ BACK BY AND VERIFIED WITH: L COLLIE RN 1942 10/31/18 A BROWNING    Staphylococcus aureus POSITIVE (A) NEGATIVE Final    Comment: (NOTE) The Xpert SA Assay (FDA approved for NASAL specimens in patients 30 years of age and older), is one component of a comprehensive surveillance program. It is not intended to diagnose infection nor to guide or monitor treatment. Performed at [facility], [place] [address], [place], [place] [address]          Radiology Studies: No results found.      Scheduled Meds: . sodium chloride   Intravenous Once  . sodium chloride   Intravenous Once  . acetaminophen  500 mg Oral Q8H  . aspirin EC  81 mg Oral Daily  . calcium citrate  200 mg of elemental calcium Oral BID  . carbidopa-levodopa  1 tablet Oral TID  . cholecalciferol  2,000 Units Oral BID  . citalopram  20 mg Oral Daily  . clonazePAM  1 mg Oral QHS  . docusate sodium  100 mg Oral BID  . enoxaparin (LOVENOX) injection  40 mg Subcutaneous Q24H  . feeding supplement (ENSURE ENLIVE)  237 mL Oral TID BM  . hydroxypropyl methylcellulose / hypromellose  1 drop Both Eyes QID  . influenza vaccine adjuvanted  0.5 mL Intramuscular Tomorrow-1000  . insulin aspart  0-15 Units Subcutaneous TID WC  . insulin aspart  0-5 Units Subcutaneous QHS  . mupirocin ointment   Nasal BID  . ondansetron (ZOFRAN)  IV  4 mg Intravenous Q6H   Or  . ondansetron  4 mg Oral Q6H  . pantoprazole (PROTONIX) IV  40 mg Intravenous Q12H  . primidone  250 mg Oral BID  . simvastatin  10 mg Oral q1800  . vitamin C  500 mg Oral Daily  . Vitamin D (Ergocalciferol)  50,000 Units Oral Q7 days  Continuous Infusions: . sodium chloride 10 mL/hr at 11/02/18 1315  . lactated ringers 100 mL/hr at 11/03/18 1119     LOS: 4 days    Time spent: over 30 min    Fayrene Helper, MD [facility] Pager [email or web address]  If 7PM-7AM, please contact night-coverage [email or web address] Password [email or web address] 11/04/2018, 5:53 PM

## 2018-11-04 NOTE — Plan of Care (Signed)

## 2018-11-05 ENCOUNTER — Inpatient Hospital Stay (HOSPITAL_COMMUNITY): Payer: Medicare Other

## 2018-11-05 LAB — CBC
HCT: 28.4 % — ABNORMAL LOW (ref 36.0–46.0)
Hemoglobin: 9.1 g/dL — ABNORMAL LOW (ref 12.0–15.0)
MCH: 31.4 pg (ref 26.0–34.0)
MCHC: 32 g/dL (ref 30.0–36.0)
MCV: 97.9 fL (ref 80.0–100.0)
Platelets: 172 10*3/uL (ref 150–400)
RBC: 2.9 MIL/uL — ABNORMAL LOW (ref 3.87–5.11)
RDW: 15.9 % — ABNORMAL HIGH (ref 11.5–15.5)
WBC: 5.4 10*3/uL (ref 4.0–10.5)
nRBC: 0 % (ref 0.0–0.2)

## 2018-11-05 LAB — GLUCOSE, CAPILLARY
Glucose-Capillary: 137 mg/dL — ABNORMAL HIGH (ref 70–99)
Glucose-Capillary: 134 mg/dL — ABNORMAL HIGH (ref 70–99)
Glucose-Capillary: 125 mg/dL — ABNORMAL HIGH (ref 70–99)
Glucose-Capillary: 109 mg/dL — ABNORMAL HIGH (ref 70–99)

## 2018-11-05 LAB — MAGNESIUM: Magnesium: 2 mg/dL (ref 1.7–2.4)

## 2018-11-05 LAB — COMPREHENSIVE METABOLIC PANEL
ALT: 8 U/L (ref 0–44)
AST: 64 U/L — ABNORMAL HIGH (ref 15–41)
Albumin: 2.9 g/dL — ABNORMAL LOW (ref 3.5–5.0)
Alkaline Phosphatase: 77 U/L (ref 38–126)
Anion gap: 12 (ref 5–15)
BUN: 22 mg/dL (ref 8–23)
CO2: 24 mmol/L (ref 22–32)
Calcium: 8.2 mg/dL — ABNORMAL LOW (ref 8.9–10.3)
Chloride: 102 mmol/L (ref 98–111)
Creatinine, Ser: 0.98 mg/dL (ref 0.44–1.00)
GFR calc Af Amer: >60 mL/min (ref >60)
GFR calc non Af Amer: 54 mL/min — ABNORMAL LOW (ref >60)
Glucose, Bld: 134 mg/dL — ABNORMAL HIGH (ref 70–99)
Potassium: 3.8 mmol/L (ref 3.5–5.1)
Sodium: 138 mmol/L (ref 135–145)
Total Bilirubin: 0.9 mg/dL (ref 0.3–1.2)
Total Protein: 6.1 g/dL — ABNORMAL LOW (ref 6.5–8.1)

## 2018-11-05 LAB — SARS CORONAVIRUS 2 (TAT 6-24 HRS): SARS Coronavirus 2: NEGATIVE

## 2018-11-05 MED ORDER — ONDANSETRON 4 MG PO TBDP
4.0000 mg | ORAL_TABLET | Freq: Three times a day (TID) | ORAL | Status: DC | PRN
  Administered 2018-11-05 – 2018-11-10 (×6): 4 mg via ORAL
  Filled 2018-11-05 (×6): qty 1

## 2018-11-05 MED ORDER — ERYTHROMYCIN 5 MG/GM OP OINT
TOPICAL_OINTMENT | Freq: Every day | OPHTHALMIC | Status: DC
  Administered 2018-11-08 – 2018-11-09 (×2): via OPHTHALMIC
  Filled 2018-11-05: qty 3.5

## 2018-11-05 MED ORDER — ERYTHROMYCIN 5 MG/GM OP OINT
1.0000 "application " | TOPICAL_OINTMENT | Freq: Three times a day (TID) | OPHTHALMIC | Status: AC
  Administered 2018-11-05 – 2018-11-08 (×9): 1 via OPHTHALMIC
  Filled 2018-11-05 (×2): qty 3.5

## 2018-11-05 MED ORDER — IOHEXOL 300 MG/ML  SOLN
100.0000 mL | Freq: Once | INTRAMUSCULAR | Status: AC | PRN
  Administered 2018-11-05: 100 mL via INTRAVENOUS

## 2018-11-05 MED ORDER — ERYTHROMYCIN 5 MG/GM OP OINT
1.0000 "application " | TOPICAL_OINTMENT | Freq: Three times a day (TID) | OPHTHALMIC | Status: DC
  Filled 2018-11-05: qty 3.5

## 2018-11-05 NOTE — Progress Notes (Signed)
PROGRESS NOTE    Kristen Mason  [id number] DOB: 07-10-1934 DOA: 10/31/2018 PCP: Wenda Low, MD   Brief Narrative:  Kristen Mason is an 84 y.o. female with medical history significant ofhypertension, diabetes, GERD, chronic kidney disease presents to emergency department with right leg pain following a fall.  Found to have distal R femoral fracture.  Now s/p IM nail. Plan for SNF, but pt with continued N/V and abdominal pain.  Planning for CT abdomen/pelvis.     Assessment & Plan:   Principal Problem:   Femur fracture, right (HCC) Active Problems:   Controlled diabetes mellitus ([place])   Essential hypertension   Essential tremor   Acute on chronic kidney failure (HCC)   Macrocytic anemia   Abdominal pain   Femur fracture, right (HCC) Closed right supracondylar distal femur fracture status post retrograde IMN. Unrestricted range of motion to the right knee and right hip, touch down weight bearing to R leg Dressing over wound either today or tomorrow. Awaiting physical therapy evaluation, float heels to prevent pressure ulcer.  Keep knee in full extension when at rest.  Ortho c/s, appreciate recs Pt with nausea, continue APAP   Lovenox for DVT ppx x 4 weeks Needs outpatient osteoporosis workup and treatment    Fracture/avulsion of lateral calcaneus: No intervention at this time.  Essential hypertension: Restart amlodipine Continue to hold lisinopril  Diabetes mellitus type 2: Hemoglobin A1c is 6.2 continue sliding scale insulin.  Acute blood loss anemia/microcytosis: Continue IM vitamin b12 Iron/ferritin borderline, will start iron supplementation Continue to follow  Acute kidney injury: Likely prerenal azotemia resolved with IV fluid hydration in the setting of NSAID use.  AGMA Mild, lactate negative and VBG without acidemia (7.44/35.8), continue to monitor  Essential tremor Continue primidone.  Intractable nausea and vomiting  Abdominal Pain:  Continue Zofran Continue PPI BID  Continue MiraLAX. Will check CT abdomen/pelvis today given persistent N/V and abdominal discomfort  Eye Pain  Itching  Subconjunctival Hemorrhage to L Eye:  No improvement with artificial tears alone Main complaint today is R eye pain.  Possible corneal abrasion in OR.  Discussed with ophtho, recommended erythromycin ointment TID x 3 days, then nightly.  Consider f/u with ophtho if no improvement.  DVT prophylaxis:lovenox Code Status: full  Family Communication: daughter at bedside Disposition Plan: hopefully d/c to SNF tomorrow  Consultants:   orthopedics  Procedures:  10/6 INTRAMEDULLARY (IM) RETROGRADE FEMORAL NAILING (Right) WITH BIOMET [place] 10.5 X 380 MM STATICALLY LOCKED  Antimicrobials:  Anti-infectives (From admission, onward)   Start     Dose/Rate Route Frequency Ordered Stop   11/01/18 2330  ceFAZolin (ANCEF) IVPB 1 g/50 mL premix     1 g 100 mL/hr over 30 Minutes Intravenous Every 8 hours 11/01/18 2029 11/02/18 1624   11/01/18 1400  ceFAZolin (ANCEF) IVPB 2g/100 mL premix     2 g 200 mL/hr over 30 Minutes Intravenous  Once 11/01/18 1350 11/01/18 1533         Subjective: C/o nausea and abdominal pain. Really doesn't feel well Doesn't want to discharge today C/o R eye pain  Objective: Vitals:   11/04/18 1934 11/05/18 0324 11/05/18 0341 11/05/18 0750  BP: (!) 141/61 134/83  (!) 143/53  Pulse: (!) 48 (!) 121 82 82  Resp:    16  Temp: 97.6 F (36.4 C) 98.4 F (36.9 C)  99.5 F (37.5 C)  TempSrc: Oral Oral  Oral  SpO2: 96% 95% 94% 94%  Weight:      Height:  Intake/Output Summary (Last 24 hours) at 11/05/2018 1631 Last data filed at 11/05/2018 1300 Gross per 24 hour  Intake 720 ml  Output 601 ml  Net 119 ml   Filed Weights   10/31/18 1120 11/01/18 1412  Weight: 70.3 kg 70.3 kg    Examination:  General: No acute distress. Cardiovascular: Heart sounds show a regular rate, and rhythm.  Lungs:  Clear to auscultation bilaterally Abdomen: Soft, ttp in lower abdomen Neurological: Alert and oriented 3. Moves all extremities 4. Cranial nerves II through XII grossly intact. Skin: Warm and dry. No rashes or lesions. Extremities: dressings intact to RLE     Data Reviewed: I have personally reviewed following labs and imaging studies  CBC: Recent Labs  Lab 10/31/18 1115 11/01/18 0351 11/01/18 1427 11/02/18 0203 11/03/18 0242 11/04/18 0154 11/05/18 0430  WBC 8.0 4.7  --  5.9 6.6 5.0 5.4  NEUTROABS 6.2  --   --   --   --   --   --   HGB 9.0* 6.9* 10.1* 8.9* 8.7* 8.0* 9.1*  HCT 29.3* 21.3* 31.8* 27.5* 27.5* 23.1* 28.4*  MCV 102.8* 99.5  --  96.5 97.5 95.1 97.9  PLT 161 104*  --  109* 125* 161 [id number]   Basic Metabolic Panel: Recent Labs  Lab 10/31/18 1115 10/31/18 1445 11/01/18 0351 11/02/18 0203 11/04/18 0154 11/05/18 0430  NA 139  --  138 137 136 138  K 4.7  --  4.0 4.2 4.0 3.8  CL 104  --  106 102 101 102  CO2 24  --  23 21* 19* 24  GLUCOSE 155*  --  117* 118* 145* 134*  BUN 22  --  24* 20 18 22   CREATININE 1.36*  --  1.26* 1.07* 0.93 0.98  CALCIUM 8.1*  --  7.4* 7.6*  7.6* 7.9* 8.2*  MG  --  1.9  --  1.7  --  2.0  PHOS  --  3.7  --  2.9  --   --    GFR: Estimated Creatinine Clearance: 44.3 mL/min (by C-G formula based on SCr of 0.98 mg/dL). Liver Function Tests: Recent Labs  Lab 11/05/18 0430  AST 64*  ALT 8  ALKPHOS 77  BILITOT 0.9  PROT 6.1*  ALBUMIN 2.9*   No results for input(s): LIPASE, AMYLASE in the last 168 hours. No results for input(s): AMMONIA in the last 168 hours. Coagulation Profile: No results for input(s): INR, PROTIME in the last 168 hours. Cardiac Enzymes: No results for input(s): CKTOTAL, CKMB, CKMBINDEX, TROPONINI in the last 168 hours. BNP (last 3 results) No results for input(s): PROBNP in the last 8760 hours. HbA1C: No results for input(s): HGBA1C in the last 72 hours. CBG: Recent Labs  Lab 11/04/18 1629 11/04/18 2143  11/05/18 0621 11/05/18 1119 11/05/18 1616  GLUCAP 134* 137* 137* 134* 125*   Lipid Profile: No results for input(s): CHOL, HDL, LDLCALC, TRIG, CHOLHDL, LDLDIRECT in the last 72 hours. Thyroid Function Tests: No results for input(s): TSH, T4TOTAL, FREET4, T3FREE, THYROIDAB in the last 72 hours. Anemia Panel: No results for input(s): VITAMINB12, FOLATE, FERRITIN, TIBC, IRON, RETICCTPCT in the last 72 hours. Sepsis Labs: Recent Labs  Lab 11/04/18 0847  LATICACIDVEN 1.5    Recent Results (from the past 240 hour(s))  SARS CORONAVIRUS 2 (TAT 6-24 HRS) Nasopharyngeal Nasopharyngeal Swab     Status: None   Collection Time: 10/31/18 12:45 PM   Specimen: Nasopharyngeal Swab  Result Value Ref Range Status   SARS  Coronavirus 2 NEGATIVE NEGATIVE Final    Comment: (NOTE) SARS-CoV-2 target nucleic acids are NOT DETECTED. The SARS-CoV-2 RNA is generally detectable in upper and lower respiratory specimens during the acute phase of infection. Negative results do not preclude SARS-CoV-2 infection, do not rule out co-infections with other pathogens, and should not be used as the sole basis for treatment or other patient management decisions. Negative results must be combined with clinical observations, patient history, and epidemiological information. The expected result is Negative. Fact Sheet for Patients: SugarRoll.be Fact Sheet for Healthcare Providers: https://www.woods-mathews.com/ This test is not yet approved or cleared by the [place] FDA and  has been authorized for detection and/or diagnosis of SARS-CoV-2 by FDA under an Emergency Use Authorization (EUA). This EUA will remain  in effect (meaning this test can be used) for the duration of the COVID-19 declaration under Section 56 4(b)(1) of the Act, 21 U.S.C. section 360bbb-3(b)(1), unless the authorization is terminated or revoked sooner. Performed at [facility], [place]  [address], [place], [place] [address]   Surgical pcr screen     Status: Abnormal   Collection Time: 10/31/18  5:50 PM   Specimen: Nasal Mucosa; Nasal Swab  Result Value Ref Range Status   MRSA, PCR POSITIVE (A) NEGATIVE Final    Comment: RESULT CALLED TO, READ BACK BY AND VERIFIED WITH: L COLLIE RN 1942 10/31/18 A BROWNING    Staphylococcus aureus POSITIVE (A) NEGATIVE Final    Comment: (NOTE) The Xpert SA Assay (FDA approved for NASAL specimens in patients 57 years of age and older), is one component of a comprehensive surveillance program. It is not intended to diagnose infection nor to guide or monitor treatment. Performed at [facility], [place] [address], [place], [place] [address]   SARS CORONAVIRUS 2 (TAT 6-24 HRS) Nasopharyngeal Nasopharyngeal Swab     Status: None   Collection Time: 11/05/18  6:19 AM   Specimen: Nasopharyngeal Swab  Result Value Ref Range Status   SARS Coronavirus 2 NEGATIVE NEGATIVE Final    Comment: (NOTE) SARS-CoV-2 target nucleic acids are NOT DETECTED. The SARS-CoV-2 RNA is generally detectable in upper and lower respiratory specimens during the acute phase of infection. Negative results do not preclude SARS-CoV-2 infection, do not rule out co-infections with other pathogens, and should not be used as the sole basis for treatment or other patient management decisions. Negative results must be combined with clinical observations, patient history, and epidemiological information. The expected result is Negative. Fact Sheet for Patients: SugarRoll.be Fact Sheet for Healthcare Providers: https://www.woods-mathews.com/ This test is not yet approved or cleared by the [place] FDA and  has been authorized for detection and/or diagnosis of SARS-CoV-2 by FDA under an Emergency Use Authorization (EUA). This EUA will remain  in effect (meaning this test can be used) for the duration of the COVID-19 declaration  under Section 56 4(b)(1) of the Act, 21 U.S.C. section 360bbb-3(b)(1), unless the authorization is terminated or revoked sooner. Performed at [facility], [place] [address] [place], [place] [address]          Radiology Studies: No results found.      Scheduled Meds: . sodium chloride   Intravenous Once  . sodium chloride   Intravenous Once  . acetaminophen  500 mg Oral Q8H  . amLODipine  5 mg Oral Daily  . aspirin EC  81 mg Oral Daily  . calcium citrate  200 mg of elemental calcium Oral BID  . carbidopa-levodopa  1 tablet Oral TID  . cholecalciferol  2,000 Units Oral BID  . citalopram  20 mg Oral Daily  . clonazePAM  1 mg Oral QHS  . cyanocobalamin  1,000 mcg Intramuscular Q1200  . docusate sodium  100 mg Oral BID  . enoxaparin (LOVENOX) injection  40 mg Subcutaneous Q24H  . erythromycin  1 application Right Eye [id number]  . feeding supplement (ENSURE ENLIVE)  237 mL Oral TID BM  . ferrous sulfate  325 mg Oral Q breakfast  . hydroxypropyl methylcellulose / hypromellose  1 drop Both Eyes QID  . influenza vaccine adjuvanted  0.5 mL Intramuscular Tomorrow-1000  . insulin aspart  0-15 Units Subcutaneous TID WC  . insulin aspart  0-5 Units Subcutaneous QHS  . mupirocin ointment   Nasal BID  . pantoprazole (PROTONIX) IV  40 mg Intravenous Q12H  . primidone  250 mg Oral BID  . simvastatin  10 mg Oral q1800  . vitamin C  500 mg Oral Daily  . Vitamin D (Ergocalciferol)  50,000 Units Oral Q7 days   Continuous Infusions: . sodium chloride 10 mL/hr at 11/02/18 1315  . lactated ringers 100 mL/hr at 11/03/18 1119     LOS: 5 days    Time spent: over 30 min    Fayrene Helper, MD [facility] Pager [email or web address]  If 7PM-7AM, please contact night-coverage [email or web address] Password [email or web address] 11/05/2018, 4:31 PM

## 2018-11-05 NOTE — Plan of Care (Signed)

## 2018-11-05 NOTE — Progress Notes (Signed)
Physical Therapy Treatment Patient Details Name: Kristen Mason MRN: [id number] DOB: May 24, 1934 Today's Date: 11/05/2018    History of Present Illness 84 yo admitted after fall at home with right femur fx s/p IM nail and right calcaneal fx non-op. PMhx: HTN, DM, CKD, neuropathy, bil knee flexion contracture due to endstage OA    PT Comments    On arrival to room, pt reports she if feeling very poorly but agreeable to participate with therapy. Throughout session, pt required small breaks, secondary to nausea with emesis, and pain. Pt is very motivated to push through her discomfort and recover. She transferred to recliner chair with max A +2. May do well with a stedy next session. Will continue to follow acutely.    Follow Up Recommendations  SNF     Equipment Recommendations  None recommended by PT    Recommendations for Other Services       Precautions / Restrictions Precautions Precautions: Fall Restrictions Weight Bearing Restrictions: Yes RLE Weight Bearing: Touchdown weight bearing(pt able to recall WB precautions)    Mobility  Bed Mobility Overal bed mobility: Needs Assistance Bed Mobility: Supine to Sit     Supine to sit: Mod assist;HOB elevated     General bed mobility comments: Pt able to progress LEs off EOB and pivot hips. Assist given to elevate trunk and scoot forward to EOB.  Transfers Overall transfer level: Needs assistance Equipment used: Rolling walker (2 wheeled) Transfers: Sit to/from [employer] Sit to Stand: Max assist;+2 physical assistance(Using pad under hips)   Squat pivot transfers: Max assist;+2 physical assistance(Pad under hips)     General transfer comment: Pt stood from EOB with max A +2 using pad under hips. Pt unable to come fully upright. However with trunk significantly flexed over RW pt began to pivot to recliner chair. Pts R LE on PTAs foot to assist in maintaining TDWB.   Ambulation/Gait              General Gait Details: unable   Stairs             Wheelchair Mobility    Modified Rankin (Stroke Patients Only)       Balance Overall balance assessment: Needs assistance Sitting-balance support: No upper extremity supported Sitting balance-Leahy Scale: Fair     Standing balance support: Bilateral upper extremity supported Standing balance-Leahy Scale: Poor Standing balance comment: bil UE support and external support in standing                            Cognition Arousal/Alertness: Awake/alert Behavior During Therapy: WFL for tasks assessed/performed Overall Cognitive Status: Within Functional Limits for tasks assessed                                        Exercises General Exercises - Lower Extremity Long Arc Quad: (AAROM on RLE)    General Comments General comments (skin integrity, edema, etc.): Pt with nausea and emisis during session.      Pertinent Vitals/Pain Pain Assessment: 0-10 Pain Score: 10-Worst pain ever Pain Location: RLE with movement Pain Descriptors / Indicators: Aching;Guarding Pain Intervention(s): Monitored during session;Limited activity within patient's tolerance;Repositioned;Patient requesting pain meds-RN notified;RN gave pain meds during session    Home Living  Prior Function            PT Goals (current goals can now be found in the care plan section) Acute Rehab PT Goals Patient Stated Goal: "I want to feel better" PT Goal Formulation: With patient/family Time For Goal Achievement: 11/17/18 Potential to Achieve Goals: Fair Progress towards PT goals: Progressing toward goals    Frequency    Min 3X/week      PT Plan Current plan remains appropriate    Co-evaluation              AM-PAC PT "6 Clicks" Mobility   Outcome Measure  Help needed turning from your back to your side while in a flat bed without using bedrails?: A Lot Help needed moving  from lying on your back to sitting on the side of a flat bed without using bedrails?: A Lot Help needed moving to and from a bed to a chair (including a wheelchair)?: Total Help needed standing up from a chair using your arms (e.g., wheelchair or bedside chair)?: Total Help needed to walk in hospital room?: Total Help needed climbing 3-5 steps with a railing? : Total 6 Click Score: 8    End of Session Equipment Utilized During Treatment: Gait belt Activity Tolerance: Patient limited by pain;Other (comment)(nausea) Patient left: in chair;with call bell/phone within reach;with family/visitor present;with chair alarm set Nurse Communication: Mobility status;Other (comment)(+2 pivot for transfer) PT Visit Diagnosis: Other abnormalities of gait and mobility (R26.89);Muscle weakness (generalized) (M62.81);History of falling (Z91.81)     Time: [id number] PT Time Calculation (min) (ACUTE ONLY): 28 min  Charges:  $Therapeutic Activity: 23-37 mins                     Benjiman Core, [place] Pager [phone] Acute Rehab   Allena Katz 11/05/2018, 11:59 AM

## 2018-11-05 NOTE — Progress Notes (Addendum)
Orthopedic Trauma Service Progress Note  Patient ID: Kristen Mason MRN: [id number] DOB/AGE: [birth year] 84 y.o.  Subjective: Complaining of right eye pain. Feels like her nausea is worse today than it was yesterday. Ortho issues improving. Patient transitioning back to bed from chair with nursing upon entering room.  ROS As above  Objective:   VITALS:   Vitals:   11/04/18 1934 11/05/18 0324 11/05/18 0341 11/05/18 0750  BP: (!) 141/61 134/83  (!) 143/53  Pulse: (!) 48 (!) 121 82 82  Resp:    16  Temp: 97.6 F (36.4 C) 98.4 F (36.9 C)  99.5 F (37.5 C)  TempSrc: Oral Oral  Oral  SpO2: 96% 95% 94% 94%  Weight:      Height:        Estimated body mass index is 25.79 kg/m as calculated from the following:   Height as of this encounter: 5\' 5"  (1.651 m).   Weight as of this encounter: 70.3 kg.   Intake/Output      10/09 0701 - 10/10 0700 10/10 0701 - 10/11 0700   P.O. 720 240   I.V. (mL/kg)     Total Intake(mL/kg) 720 (10.2) 240 (3.4)   Urine (mL/kg/hr) 1100 (0.7) 300 (0.8)   Total Output 1100 300   Net -380 -60        Urine Occurrence  1 x     LABS  Results for orders placed or performed during the hospital encounter of 10/31/18 (from the past 24 hour(s))  Glucose, capillary     Status: Abnormal   Collection Time: 11/04/18  4:29 PM  Result Value Ref Range   Glucose-Capillary 134 (H) 70 - 99 mg/dL  Glucose, capillary     Status: Abnormal   Collection Time: 11/04/18  9:43 PM  Result Value Ref Range   Glucose-Capillary 137 (H) 70 - 99 mg/dL   Comment 1 Document in Chart   CBC     Status: Abnormal   Collection Time: 11/05/18  4:30 AM  Result Value Ref Range   WBC 5.4 4.0 - 10.5 K/uL   RBC 2.90 (L) 3.87 - 5.11 MIL/uL   Hemoglobin 9.1 (L) 12.0 - 15.0 g/dL   HCT 28.4 (L) 36.0 - 46.0 %   MCV 97.9 80.0 - 100.0 fL   MCH 31.4 26.0 - 34.0 pg   MCHC 32.0 30.0 - 36.0 g/dL   RDW 15.9 (H) 11.5 -  15.5 %   Platelets 172 150 - 400 K/uL   nRBC 0.0 0.0 - 0.2 %  Comprehensive metabolic panel     Status: Abnormal   Collection Time: 11/05/18  4:30 AM  Result Value Ref Range   Sodium 138 135 - 145 mmol/L   Potassium 3.8 3.5 - 5.1 mmol/L   Chloride 102 98 - 111 mmol/L   CO2 24 22 - 32 mmol/L   Glucose, Bld 134 (H) 70 - 99 mg/dL   BUN 22 8 - 23 mg/dL   Creatinine, Ser 0.98 0.44 - 1.00 mg/dL   Calcium 8.2 (L) 8.9 - 10.3 mg/dL   Total Protein 6.1 (L) 6.5 - 8.1 g/dL   Albumin 2.9 (L) 3.5 - 5.0 g/dL   AST 64 (H) 15 - 41 U/L   ALT 8 0 - 44 U/L   Alkaline Phosphatase 77 38 - 126  U/L   Total Bilirubin 0.9 0.3 - 1.2 mg/dL   GFR calc non Af Amer 54 (L) >60 mL/min   GFR calc Af Amer >60 >60 mL/min   Anion gap 12 5 - 15  Magnesium     Status: None   Collection Time: 11/05/18  4:30 AM  Result Value Ref Range   Magnesium 2.0 1.7 - 2.4 mg/dL  Glucose, capillary     Status: Abnormal   Collection Time: 11/05/18  6:21 AM  Result Value Ref Range   Glucose-Capillary 137 (H) 70 - 99 mg/dL   Comment 1 Document in Chart   Glucose, capillary     Status: Abnormal   Collection Time: 11/05/18 11:19 AM  Result Value Ref Range   Glucose-Capillary 134 (H) 70 - 99 mg/dL   Results for CASELYNN, JAVAID (MRN [id number]) as of 11/04/2018 10:06  Ref. Range 11/02/2018 02:03  Vit D, 1,25-Dihydroxy Latest Ref Range: 19.9 - 79.3 pg/mL 9.4 (L)  Vitamin D, 25-Hydroxy Latest Ref Range: 30 - 100 ng/mL 7.78 (L)    Results for MIRHA, VARONE (MRN [id number]) as of 11/04/2018 10:06  Ref. Range 11/02/2018 02:03  PTH, Intact Latest Ref Range: 15 - 65 pg/mL 59  Calcium, Total (PTH) Latest Ref Range: 8.7 - 10.3 mg/dL 7.6 (L)    PHYSICAL EXAM:   Gen: awake, sitting up in bed. NAD Respiratory: unlabored Cardiac: regular  Right Lower Extremity: Incisions clean, dry, intact. Swelling well controlled. Ecchymosis over the knee. Distal motor and sensory functions grossly intact. Dorsiflexion/plantarflexion intact. Compartments  are soft and compressible.   Assessment/Plan: 4 Days Post-Op   Principal Problem:   Femur fracture, right (HCC) Active Problems:   Controlled diabetes mellitus ([place])   Essential hypertension   Essential tremor   Acute on chronic kidney failure (HCC)   Macrocytic anemia   Abdominal pain   Anti-infectives (From admission, onward)   Start     Dose/Rate Route Frequency Ordered Stop   11/01/18 2330  ceFAZolin (ANCEF) IVPB 1 g/50 mL premix     1 g 100 mL/hr over 30 Minutes Intravenous Every 8 hours 11/01/18 2029 11/02/18 1624   11/01/18 1400  ceFAZolin (ANCEF) IVPB 2g/100 mL premix     2 g 200 mL/hr over 30 Minutes Intravenous  Once 11/01/18 1350 11/01/18 1533    .  POD/HD#: 72  84 y/o female s/p ground level fall with right supracondylar distal femur fracture    - closed R supracondylar distal femur fracture s/p retrograde IMN              - TDWB R leg             - Unrestricted ROM R knee and hip (at baseline she is restricted due to              advanced endstage DJD)             - PT/OT             - Okay to change dressing as needed, Okay to clean incisions with soap and    water              - Ice and elevate              -  Float heels off bed to prevent pressure sores and to keep knee in full extension when at rest so as to not exacerbate her contractures any further              -  No pillows under knee- place under ankle to keep knee in full extension when at rest   - TED hose                 - Pain management:            - scheduled tylenol             - ultram    - ABL anemia/Hemodynamics            - Hgb improved from yesterday, will continue to monitor   - Medical issues             - Per medical team    - Refractory nausea improved yesterday, worse today. Will continue to monitor and continue bowel regimen    - Diabetes On SSI    - DVT/PE prophylaxis:             Lovenox x 4 weeks    - ID:              periop abx completed    - Metabolic Bone  Disease:             profound vitamin d deficiency                          Continue d3 2000 IU BID and vitamin d2 50000 weekly                          Vitamin c    Calcitriol levels suggest long standing vitamin d deficiency and along with PTH levels suggest calcium deficiency                                        Will need DEXA in 4-8 weeks             Fracture indicative of osteoporosis                          Will likely need pharmacologic treatment of her osteoporosis                          Would strongly favor starting treatment of her osteoporosis before considering Total joint replacement    - Activity:             TDWB R leg    - FEN/GI prophylaxis/Foley/Lines:             carb mod diet, assistance with feeding due to tremors                - Impediments to fracture healing:             Osteoporosis             DM             Falls/balance issues             Psychotropic medication    - Dispo:             Continue with physical and occupational therapy             Will need SNF   Ortho issues improving      Matti Killingsworth A. [facility] ?[phone] (phone) 11/05/2018,  12:07 PM  [facility] [place] [address] [phone] (F)

## 2018-11-06 DIAGNOSIS — S7291XA Unspecified fracture of right femur, initial encounter for closed fracture: Secondary | ICD-10-CM

## 2018-11-06 LAB — COMPREHENSIVE METABOLIC PANEL
ALT: 7 U/L (ref 0–44)
AST: 54 U/L — ABNORMAL HIGH (ref 15–41)
Albumin: 2.8 g/dL — ABNORMAL LOW (ref 3.5–5.0)
Alkaline Phosphatase: 82 U/L (ref 38–126)
Anion gap: 11 (ref 5–15)
BUN: 21 mg/dL (ref 8–23)
CO2: 25 mmol/L (ref 22–32)
Calcium: 8 mg/dL — ABNORMAL LOW (ref 8.9–10.3)
Chloride: 103 mmol/L (ref 98–111)
Creatinine, Ser: 0.96 mg/dL (ref 0.44–1.00)
GFR calc Af Amer: >60 mL/min (ref >60)
GFR calc non Af Amer: 55 mL/min — ABNORMAL LOW (ref >60)
Glucose, Bld: 118 mg/dL — ABNORMAL HIGH (ref 70–99)
Potassium: 3.7 mmol/L (ref 3.5–5.1)
Sodium: 139 mmol/L (ref 135–145)
Total Bilirubin: 1.4 mg/dL — ABNORMAL HIGH (ref 0.3–1.2)
Total Protein: 5.6 g/dL — ABNORMAL LOW (ref 6.5–8.1)

## 2018-11-06 LAB — GLUCOSE, CAPILLARY
Glucose-Capillary: 111 mg/dL — ABNORMAL HIGH (ref 70–99)
Glucose-Capillary: 144 mg/dL — ABNORMAL HIGH (ref 70–99)
Glucose-Capillary: 118 mg/dL — ABNORMAL HIGH (ref 70–99)
Glucose-Capillary: 139 mg/dL — ABNORMAL HIGH (ref 70–99)

## 2018-11-06 LAB — CBC
HCT: 26.4 % — ABNORMAL LOW (ref 36.0–46.0)
Hemoglobin: 8.6 g/dL — ABNORMAL LOW (ref 12.0–15.0)
MCH: 31.7 pg (ref 26.0–34.0)
MCHC: 32.6 g/dL (ref 30.0–36.0)
MCV: 97.4 fL (ref 80.0–100.0)
Platelets: 174 10*3/uL (ref 150–400)
RBC: 2.71 MIL/uL — ABNORMAL LOW (ref 3.87–5.11)
RDW: 16.1 % — ABNORMAL HIGH (ref 11.5–15.5)
WBC: 5 10*3/uL (ref 4.0–10.5)
nRBC: 0 % (ref 0.0–0.2)

## 2018-11-06 LAB — LIPASE, BLOOD: Lipase: 29 U/L (ref 11–51)

## 2018-11-06 LAB — MAGNESIUM: Magnesium: 1.9 mg/dL (ref 1.7–2.4)

## 2018-11-06 MED ORDER — POLYETHYLENE GLYCOL 3350 17 G PO PACK
17.0000 g | PACK | Freq: Two times a day (BID) | ORAL | Status: DC
  Administered 2018-11-06 – 2018-11-10 (×8): 17 g via ORAL
  Filled 2018-11-06 (×9): qty 1

## 2018-11-06 NOTE — Progress Notes (Signed)
PROGRESS NOTE    Kristen Mason  [id number] DOB: 22-Jun-1934 DOA: 10/31/2018 PCP: Kristen Low, MD   Brief Narrative:  Kristen Mason is an 84 y.o. female with medical history significant ofhypertension, diabetes, GERD, chronic kidney disease presents to emergency department with right leg pain following Kristen Mason fall.  Found to have distal R femoral fracture.  Now s/p IM nail. Plan for SNF, but pt with continued N/V and abdominal pain.  Planning for CT abdomen/pelvis.     Assessment & Plan:   Principal Problem:   Femur fracture, right (HCC) Active Problems:   Controlled diabetes mellitus ([place])   Essential hypertension   Essential tremor   Acute on chronic kidney failure (HCC)   Macrocytic anemia   Abdominal pain   Femur fracture, right (HCC) Closed right supracondylar distal femur fracture status post retrograde IMN. Unrestricted range of motion to the right knee and right hip, touch down weight bearing to R leg Dressing over wound either today or tomorrow. Awaiting physical therapy evaluation, float heels to prevent pressure ulcer.  Keep knee in full extension when at rest.  Ortho c/s, appreciate recs Pt with nausea, continue APAP   Lovenox for DVT ppx x 4 weeks Needs outpatient osteoporosis workup and treatment    Nausea  Vomiting  Malaise  Abdominal Pain: CT abdomen pelvis without acute findings Labs with slightly elevated bili this AM, CT yesterday without evidence of cholecystitis  Continue zofran, PPI, miralax Continue supportive care Poor PO intake, dietician c/s  Fracture/avulsion of lateral calcaneus: No intervention at this time.  Essential hypertension: Restart amlodipine Continue to hold lisinopril  Diabetes mellitus type 2: Hemoglobin A1c is 6.2 continue sliding scale insulin.  Acute blood loss anemia/microcytosis: Continue IM vitamin b12 Iron/ferritin borderline, will start iron supplementation Continue to follow  Acute kidney injury:  Likely prerenal azotemia resolved with IV fluid hydration in the setting of NSAID use.  AGMA Mild, lactate negative and VBG without acidemia (7.44/35.8), continue to monitor  Essential tremor Continue primidone.  Eye Pain  Itching  Subconjunctival Hemorrhage to L Eye:  No improvement with artificial tears alone Main complaint today is R eye pain.  Possible corneal abrasion in OR.  Discussed with ophtho on 10/10, recommended erythromycin ointment TID x 3 days, then nightly x 4 days.  Consider f/u with ophtho if no improvement.  Renal Mass: needs f/u with MRI   DVT prophylaxis:lovenox Code Status: full  Family Communication: daughter over phone Disposition Plan: hopefully d/c to SNF when N/V and malaise improves  Consultants:   orthopedics  Procedures:  10/6 INTRAMEDULLARY (IM) RETROGRADE FEMORAL NAILING (Right) WITH BIOMET [place] 10.5 X 380 MM STATICALLY LOCKED  Antimicrobials:  Anti-infectives (From admission, onward)   Start     Dose/Rate Route Frequency Ordered Stop   11/01/18 2330  ceFAZolin (ANCEF) IVPB 1 g/50 mL premix     1 g 100 mL/hr over 30 Minutes Intravenous Every 8 hours 11/01/18 2029 11/02/18 1624   11/01/18 1400  ceFAZolin (ANCEF) IVPB 2g/100 mL premix     2 g 200 mL/hr over 30 Minutes Intravenous  Once 11/01/18 1350 11/01/18 1533         Subjective: Still feels poorly overall R eye pain (maybe Kristen Mason little better?) Continued nausea, abdominal discomfort.    Objective: Vitals:   11/05/18 1952 11/06/18 0449 11/06/18 0752 11/06/18 1415  BP: (!) 161/68 (!) 145/68 (!) 156/50 (!) 176/70  Pulse: 85 71 78 76  Resp:   16 16  Temp: 98.7 F (37.1  C) 97.7 F (36.5 C) 97.6 F (36.4 C) 98.7 F (37.1 C)  TempSrc: Oral Oral Oral Oral  SpO2: 92% 94% 96% 96%  Weight:      Height:        Intake/Output Summary (Last 24 hours) at 11/06/2018 1643 Last data filed at 11/06/2018 1244 Gross per 24 hour  Intake 480 ml  Output 1200 ml  Net -720 ml   Filed  Weights   10/31/18 1120 11/01/18 1412  Weight: 70.3 kg 70.3 kg    Examination:  General: No acute distress. Cardiovascular: Heart sounds show Kristen Mason regular rate, and rhythm. Lungs: Clear to auscultation bilaterally  Abdomen: Soft, mild TTP, nondistended Neurological: Alert and oriented 3. Moves all extremities 4. Cranial nerves II through XII grossly intact. Skin: Warm and dry. No rashes or lesions. Extremities: RLE with intact dressings  Data Reviewed: I have personally reviewed following labs and imaging studies  CBC: Recent Labs  Lab 10/31/18 1115  11/02/18 0203 11/03/18 0242 11/04/18 0154 11/05/18 0430 11/06/18 0306  WBC 8.0   < > 5.9 6.6 5.0 5.4 5.0  NEUTROABS 6.2  --   --   --   --   --   --   HGB 9.0*   < > 8.9* 8.7* 8.0* 9.1* 8.6*  HCT 29.3*   < > 27.5* 27.5* 23.1* 28.4* 26.4*  MCV 102.8*   < > 96.5 97.5 95.1 97.9 97.4  PLT 161   < > 109* 125* 161 172 174   < > = values in this interval not displayed.   Basic Metabolic Panel: Recent Labs  Lab 10/31/18 1445 11/01/18 0351 11/02/18 0203 11/04/18 0154 11/05/18 0430 11/06/18 0306  NA  --  138 137 136 138 139  K  --  4.0 4.2 4.0 3.8 3.7  CL  --  106 102 101 102 103  CO2  --  23 21* 19* 24 25  GLUCOSE  --  117* 118* 145* 134* 118*  BUN  --  24* 20 18 22 21   CREATININE  --  1.26* 1.07* 0.93 0.98 0.96  CALCIUM  --  7.4* 7.6*  7.6* 7.9* 8.2* 8.0*  MG 1.9  --  1.7  --  2.0 1.9  PHOS 3.7  --  2.9  --   --   --    GFR: Estimated Creatinine Clearance: 45.2 mL/min (by C-G formula based on SCr of 0.96 mg/dL). Liver Function Tests: Recent Labs  Lab 11/05/18 0430 11/06/18 0306  AST 64* 54*  ALT 8 7  ALKPHOS 77 82  BILITOT 0.9 1.4*  PROT 6.1* 5.6*  ALBUMIN 2.9* 2.8*   Recent Labs  Lab 11/06/18 0306  LIPASE 29   No results for input(s): AMMONIA in the last 168 hours. Coagulation Profile: No results for input(s): INR, PROTIME in the last 168 hours. Cardiac Enzymes: No results for input(s): CKTOTAL, CKMB,  CKMBINDEX, TROPONINI in the last 168 hours. BNP (last 3 results) No results for input(s): PROBNP in the last 8760 hours. HbA1C: No results for input(s): HGBA1C in the last 72 hours. CBG: Recent Labs  Lab 11/05/18 1616 11/05/18 2121 11/06/18 0632 11/06/18 1210 11/06/18 1540  GLUCAP 125* 109* 111* 144* 118*   Lipid Profile: No results for input(s): CHOL, HDL, LDLCALC, TRIG, CHOLHDL, LDLDIRECT in the last 72 hours. Thyroid Function Tests: No results for input(s): TSH, T4TOTAL, FREET4, T3FREE, THYROIDAB in the last 72 hours. Anemia Panel: No results for input(s): VITAMINB12, FOLATE, FERRITIN, TIBC, IRON, RETICCTPCT in the  last 72 hours. Sepsis Labs: Recent Labs  Lab 11/04/18 0847  LATICACIDVEN 1.5    Recent Results (from the past 240 hour(s))  SARS CORONAVIRUS 2 (TAT 6-24 HRS) Nasopharyngeal Nasopharyngeal Swab     Status: None   Collection Time: 10/31/18 12:45 PM   Specimen: Nasopharyngeal Swab  Result Value Ref Range Status   SARS Coronavirus 2 NEGATIVE NEGATIVE Final    Comment: (NOTE) SARS-CoV-2 target nucleic acids are NOT DETECTED. The SARS-CoV-2 RNA is generally detectable in upper and lower respiratory specimens during the acute phase of infection. Negative results do not preclude SARS-CoV-2 infection, do not rule out co-infections with other pathogens, and should not be used as the sole basis for treatment or other patient management decisions. Negative results must be combined with clinical observations, patient history, and epidemiological information. The expected result is Negative. Fact Sheet for Patients: SugarRoll.be Fact Sheet for Healthcare Providers: https://www.woods-mathews.com/ This test is not yet approved or cleared by the [place] FDA and  has been authorized for detection and/or diagnosis of SARS-CoV-2 by FDA under an Emergency Use Authorization (EUA). This EUA will remain  in effect (meaning this  test can be used) for the duration of the COVID-19 declaration under Section 56 4(b)(1) of the Act, 21 U.S.C. section 360bbb-3(b)(1), unless the authorization is terminated or revoked sooner. Performed at [facility], [place] [address], [place], [place] [address]   Surgical pcr screen     Status: Abnormal   Collection Time: 10/31/18  5:50 PM   Specimen: Nasal Mucosa; Nasal Swab  Result Value Ref Range Status   MRSA, PCR POSITIVE (Kristen Mason) NEGATIVE Final    Comment: RESULT CALLED TO, READ BACK BY AND VERIFIED WITH: L COLLIE RN 1942 10/31/18 Kristen Mason BROWNING    Staphylococcus aureus POSITIVE (Kristen Mason) NEGATIVE Final    Comment: (NOTE) The Xpert SA Assay (FDA approved for NASAL specimens in patients 34 years of age and older), is one component of Kristen Mason comprehensive surveillance program. It is not intended to diagnose infection nor to guide or monitor treatment. Performed at [facility], [place] [address], [place], [place] [address]   SARS CORONAVIRUS 2 (TAT 6-24 HRS) Nasopharyngeal Nasopharyngeal Swab     Status: None   Collection Time: 11/05/18  6:19 AM   Specimen: Nasopharyngeal Swab  Result Value Ref Range Status   SARS Coronavirus 2 NEGATIVE NEGATIVE Final    Comment: (NOTE) SARS-CoV-2 target nucleic acids are NOT DETECTED. The SARS-CoV-2 RNA is generally detectable in upper and lower respiratory specimens during the acute phase of infection. Negative results do not preclude SARS-CoV-2 infection, do not rule out co-infections with other pathogens, and should not be used as the sole basis for treatment or other patient management decisions. Negative results must be combined with clinical observations, patient history, and epidemiological information. The expected result is Negative. Fact Sheet for Patients: SugarRoll.be Fact Sheet for Healthcare Providers: https://www.woods-mathews.com/ This test is not yet approved or cleared by the [place]  FDA and  has been authorized for detection and/or diagnosis of SARS-CoV-2 by FDA under an Emergency Use Authorization (EUA). This EUA will remain  in effect (meaning this test can be used) for the duration of the COVID-19 declaration under Section 56 4(b)(1) of the Act, 21 U.S.C. section 360bbb-3(b)(1), unless the authorization is terminated or revoked sooner. Performed at [facility], [place] [address], [place], [place] [address]          Radiology Studies: Ct Abdomen Pelvis W  Contrast  Result Date: 11/05/2018 CLINICAL DATA:  Nausea vomiting. EXAM: CT ABDOMEN AND PELVIS WITH CONTRAST TECHNIQUE: Multidetector CT imaging of the abdomen and pelvis was performed using the standard protocol following bolus administration of intravenous contrast. CONTRAST:  119mL OMNIPAQUE IOHEXOL 300 MG/ML  SOLN COMPARISON:  Abdominal radiograph November 02, 2018, PET-CT May 05, 2017 FINDINGS: Lower chest: Right lower lobe calcified granuloma. Calcific atherosclerotic disease of the coronary arteries. Hepatobiliary: Normal appearance of the liver. Cholelithiasis without evidence of cholecystitis. Pancreas: Unremarkable. No pancreatic ductal dilatation or surrounding inflammatory changes. Spleen: Normal in size without focal abnormality. Adrenals/Urinary Tract: Normal adrenal glands. Exophytic right upper pole renal mass measures 2.9 cm. Indeterminate hypoattenuated circumscribed mass in the upper pole of the left kidney. No evidence of hydronephrosis or nephrolithiasis. Stomach/Bowel: Stomach is within normal limits. Appendix appears normal. No evidence of bowel wall thickening, distention, or inflammatory changes. Vascular/Lymphatic: Aortic atherosclerosis. No enlarged abdominal or pelvic lymph nodes. Reproductive: Status post hysterectomy. No adnexal masses. Other: No abdominal wall hernia or abnormality. No abdominopelvic ascites. Musculoskeletal: Spondylosis of the spine. Moderate to advanced bilateral hip  arthropathy. Partially visualized right femoral IM nail. IMPRESSION: 1. Exophytic right upper pole renal mass measures 2.9 cm, measured on prior PET-CT 2.3 cm. Indeterminate hypoattenuated circumscribed mass in the upper pole of the left kidney measures 1.3 cm. Renal cell carcinoma cannot be excluded for either of these lesions. Further evaluation with abdominal MRI, renal protocol, may be considered if further imaging evaluation is desired. 2. Cholelithiasis without evidence of acute cholecystitis. 3. Calcific atherosclerotic disease of the coronary arteries and aorta. 4. Moderate to advanced bilateral hip arthropathy. 5. Partially visualized right femoral IM nail. Edema of the right upper thigh musculature, and overlying subcutaneous stranding, likely postsurgical. Electronically Signed   By: Fidela Salisbury M.D.   On: 11/05/2018 19:41        Scheduled Meds: . sodium chloride   Intravenous Once  . sodium chloride   Intravenous Once  . acetaminophen  500 mg Oral Q8H  . amLODipine  5 mg Oral Daily  . aspirin EC  81 mg Oral Daily  . calcium citrate  200 mg of elemental calcium Oral BID  . carbidopa-levodopa  1 tablet Oral TID  . cholecalciferol  2,000 Units Oral BID  . citalopram  20 mg Oral Daily  . clonazePAM  1 mg Oral QHS  . cyanocobalamin  1,000 mcg Intramuscular Q1200  . docusate sodium  100 mg Oral BID  . enoxaparin (LOVENOX) injection  40 mg Subcutaneous Q24H  . erythromycin  1 application Right Eye [id number]  . [START ON 11/08/2018] erythromycin   Right Eye QHS  . feeding supplement (ENSURE ENLIVE)  237 mL Oral TID BM  . ferrous sulfate  325 mg Oral Q breakfast  . hydroxypropyl methylcellulose / hypromellose  1 drop Both Eyes QID  . influenza vaccine adjuvanted  0.5 mL Intramuscular Tomorrow-1000  . insulin aspart  0-15 Units Subcutaneous TID WC  . insulin aspart  0-5 Units Subcutaneous QHS  . mupirocin ointment   Nasal BID  . pantoprazole (PROTONIX) IV  40 mg Intravenous Q12H  .  polyethylene glycol  17 g Oral BID  . primidone  250 mg Oral BID  . simvastatin  10 mg Oral q1800  . vitamin C  500 mg Oral Daily  . Vitamin D (Ergocalciferol)  50,000 Units Oral Q7 days   Continuous Infusions: . sodium chloride 10 mL/hr at 11/02/18 1315  . lactated ringers 100 mL/hr at  11/03/18 1119     LOS: 6 days    Time spent: over 24 min    Fayrene Helper, MD [facility] Pager AMION  If 7PM-7AM, please contact night-coverage [email or web address] Password [facility] 11/06/2018, 4:43 PM

## 2018-11-06 NOTE — Plan of Care (Signed)
  Problem: Activity: Goal: Risk for activity intolerance will decrease Outcome: Progressing   Problem: Coping: Goal: Level of anxiety will decrease Outcome: Progressing   Problem: Pain Managment: Goal: General experience of comfort will improve Outcome: Progressing   Problem: Safety: Goal: Ability to remain free from injury will improve Outcome: Progressing   Problem: Skin Integrity: Goal: Risk for impaired skin integrity will decrease Outcome: Progressing   

## 2018-11-07 ENCOUNTER — Inpatient Hospital Stay (HOSPITAL_COMMUNITY): Payer: Medicare Other

## 2018-11-07 DIAGNOSIS — I361 Nonrheumatic tricuspid (valve) insufficiency: Secondary | ICD-10-CM

## 2018-11-07 LAB — GLUCOSE, CAPILLARY
Glucose-Capillary: 137 mg/dL — ABNORMAL HIGH (ref 70–99)
Glucose-Capillary: 182 mg/dL — ABNORMAL HIGH (ref 70–99)
Glucose-Capillary: 113 mg/dL — ABNORMAL HIGH (ref 70–99)

## 2018-11-07 LAB — COMPREHENSIVE METABOLIC PANEL
ALT: 10 U/L (ref 0–44)
AST: 30 U/L (ref 15–41)
Albumin: 2.8 g/dL — ABNORMAL LOW (ref 3.5–5.0)
Alkaline Phosphatase: 90 U/L (ref 38–126)
Anion gap: 13 (ref 5–15)
BUN: 16 mg/dL (ref 8–23)
CO2: 25 mmol/L (ref 22–32)
Calcium: 7.9 mg/dL — ABNORMAL LOW (ref 8.9–10.3)
Chloride: 102 mmol/L (ref 98–111)
Creatinine, Ser: 0.98 mg/dL (ref 0.44–1.00)
GFR calc Af Amer: >60 mL/min (ref >60)
GFR calc non Af Amer: 54 mL/min — ABNORMAL LOW (ref >60)
Glucose, Bld: 139 mg/dL — ABNORMAL HIGH (ref 70–99)
Potassium: 3.6 mmol/L (ref 3.5–5.1)
Sodium: 140 mmol/L (ref 135–145)
Total Bilirubin: 1 mg/dL (ref 0.3–1.2)
Total Protein: 5.5 g/dL — ABNORMAL LOW (ref 6.5–8.1)

## 2018-11-07 LAB — CBC
HCT: 29.7 % — ABNORMAL LOW (ref 36.0–46.0)
Hemoglobin: 9.5 g/dL — ABNORMAL LOW (ref 12.0–15.0)
MCH: 31.3 pg (ref 26.0–34.0)
MCHC: 32 g/dL (ref 30.0–36.0)
MCV: 97.7 fL (ref 80.0–100.0)
Platelets: 237 10*3/uL (ref 150–400)
RBC: 3.04 MIL/uL — ABNORMAL LOW (ref 3.87–5.11)
RDW: 16.1 % — ABNORMAL HIGH (ref 11.5–15.5)
WBC: 5 10*3/uL (ref 4.0–10.5)
nRBC: 0 % (ref 0.0–0.2)

## 2018-11-07 LAB — TSH: TSH: 0.978 u[IU]/mL (ref 0.350–4.500)

## 2018-11-07 LAB — ECHOCARDIOGRAM COMPLETE
Height: 65 in
Weight: 2480.02 oz

## 2018-11-07 LAB — MAGNESIUM: Magnesium: 1.8 mg/dL (ref 1.7–2.4)

## 2018-11-07 MED ORDER — METOPROLOL TARTRATE 25 MG PO TABS
12.5000 mg | ORAL_TABLET | Freq: Two times a day (BID) | ORAL | Status: DC
  Administered 2018-11-07 – 2018-11-10 (×7): 12.5 mg via ORAL
  Filled 2018-11-07 (×7): qty 1

## 2018-11-07 MED ORDER — GLUCERNA SHAKE PO LIQD
237.0000 mL | Freq: Three times a day (TID) | ORAL | Status: DC
  Administered 2018-11-07 – 2018-11-08 (×3): 237 mL via ORAL

## 2018-11-07 MED ORDER — METOPROLOL TARTRATE 5 MG/5ML IV SOLN
5.0000 mg | Freq: Once | INTRAVENOUS | Status: AC
  Administered 2018-11-07: 5 mg via INTRAVENOUS
  Filled 2018-11-07: qty 5

## 2018-11-07 MED ORDER — APIXABAN 5 MG PO TABS
5.0000 mg | ORAL_TABLET | Freq: Two times a day (BID) | ORAL | Status: DC
  Administered 2018-11-07 – 2018-11-10 (×6): 5 mg via ORAL
  Filled 2018-11-07 (×6): qty 1

## 2018-11-07 MED ORDER — SODIUM CHLORIDE 0.9 % IV BOLUS
500.0000 mL | Freq: Once | INTRAVENOUS | Status: AC
  Administered 2018-11-07: 500 mL via INTRAVENOUS

## 2018-11-07 NOTE — Progress Notes (Signed)
Pt HR is noted to be in the 120's-130's and sustaining; briefly as high as 150's. Pt denies any sx at this time, however on call provider notified, orders received. 500 ml bolus currently infusing as per order.

## 2018-11-07 NOTE — Progress Notes (Signed)
Patient refused to go to CT tonight despite educating on STAT and reasons why including PE study, wants to go in the morning.  Dr. Florene Glen notified.

## 2018-11-07 NOTE — Progress Notes (Addendum)
PROGRESS NOTE    Kristen Mason  [id number] DOB: 09-29-1934 DOA: 10/31/2018 PCP: Wenda Low, MD   Brief Narrative:  Kristen Mason is an 84 y.o. female with medical history significant ofhypertension, diabetes, GERD, chronic kidney disease presents to emergency department with right leg pain following a fall.  Found to have distal R femoral fracture.  Now s/p IM nail. Plan for SNF, but pt with continued N/V and abdominal pain.  Planning for CT abdomen/pelvis.     Assessment & Plan:   Principal Problem:   Femur fracture, right (HCC) Active Problems:   Controlled diabetes mellitus ([place])   Essential hypertension   Essential tremor   Acute on chronic kidney failure (HCC)   Macrocytic anemia   Abdominal pain   Femur fracture, right (HCC) Closed right supracondylar distal femur fracture status post retrograde IMN. Unrestricted range of motion to the right knee and right hip, touch down weight bearing to R leg Dressing over wound either today or tomorrow. Awaiting physical therapy evaluation, float heels to prevent pressure ulcer.  Keep knee in full extension when at rest.  Ortho c/s, appreciate recs Pt with nausea, continue APAP   Lovenox for DVT ppx x 4 weeks Needs outpatient osteoporosis workup and treatment    Atrial Fibrillation:  Developed post op.  Chadsvasc is 4.  Candidate for anticoagulation.  Will discuss with ortho prior to starting.  Plan to call daughter as well.  Pt is amenable to anticoagulation after discussion. Follow TSH (wnl) Start on metoprolol Transitioned back to sinus this morning Echo with normal EF 60-65%, mildly dilated LA, mod dilated RA, mod pulm HTN, grade 2 diastolic dysfunction, severe RV dysfunction -> with recent surgery, new afib and severe RV dysfunction will follow CT PE protocol Will need outpatient cardiology follow up   Pulmonary Hypertension  Severe RV Dysfunction:  She'll need outpatient follow up with cardiology I/O, daily  weights  Nausea  Vomiting  Malaise  Abdominal Pain: CT abdomen pelvis without acute findings Labs with slightly elevated bili this AM, CT yesterday without evidence of cholecystitis  Continue zofran, PPI, miralax Continue supportive care Poor PO intake -> this seems to be improving today, dietician c/s  Fracture/avulsion of lateral calcaneus: No intervention at this time.  Essential hypertension: Restart amlodipine Resume lisinopril   Diabetes mellitus type 2: Hemoglobin A1c is 6.2 continue sliding scale insulin.  Acute blood loss anemia/microcytosis: Continue IM vitamin b12 Iron/ferritin borderline, will start iron supplementation Continue to follow  Acute kidney injury: Likely prerenal azotemia resolved with IV fluid hydration in the setting of NSAID use.  AGMA Mild, lactate negative and VBG without acidemia (7.44/35.8), continue to monitor  Essential tremor Continue primidone.  Eye Pain  Itching  Subconjunctival Hemorrhage to L Eye:  No improvement with artificial tears alone Main complaint today is R eye pain.  Possible corneal abrasion in OR.  Discussed with ophtho on 10/10, recommended erythromycin ointment TID x 3 days, then nightly x 4 days.  Consider f/u with ophtho if no improvement.  Renal Mass: seen on PET scan in 2019, will get imaging here as pt going to be d/c'd to SNF and this would be otherwise delayed.   DVT prophylaxis:lovenox Code Status: full  Family Communication: daughter over phone Disposition Plan: hopefully d/c to SNF when N/V and malaise and poor PO improves, mild improvement today, but not to baseline as well as new onset afib needing w/u.    Consultants:   orthopedics  Procedures:  10/6 INTRAMEDULLARY (IM) RETROGRADE  FEMORAL NAILING (Right) WITH BIOMET [place] 10.5 X 380 MM STATICALLY LOCKED  Antimicrobials:  Anti-infectives (From admission, onward)   Start     Dose/Rate Route Frequency Ordered Stop   11/01/18 2330   ceFAZolin (ANCEF) IVPB 1 g/50 mL premix     1 g 100 mL/hr over 30 Minutes Intravenous Every 8 hours 11/01/18 2029 11/02/18 1624   11/01/18 1400  ceFAZolin (ANCEF) IVPB 2g/100 mL premix     2 g 200 mL/hr over 30 Minutes Intravenous  Once 11/01/18 1350 11/01/18 1533         Subjective: Feeling gradually better Still with pain to R eye  Objective: Vitals:   11/07/18 0239 11/07/18 0244 11/07/18 0318 11/07/18 0807  BP: 129/87   (!) 127/56  Pulse: (!) 130 (!) 119 93 73  Resp:    16  Temp: (!) 97.5 F (36.4 C)   98.2 F (36.8 C)  TempSrc: Oral   Oral  SpO2: 92%   95%  Weight:      Height:        Intake/Output Summary (Last 24 hours) at 11/07/2018 1508 Last data filed at 11/07/2018 1300 Gross per 24 hour  Intake 1460.02 ml  Output 1500 ml  Net -39.98 ml   Filed Weights   10/31/18 1120 11/01/18 1412  Weight: 70.3 kg 70.3 kg    Examination:  General: No acute distress. Cardiovascular: Heart sounds show a regular rate, and rhythm.  Lungs: Clear to auscultation bilaterally  Abdomen: Soft, nontender, nondistended  Neurological: Alert and oriented 3. Moves all extremities 4 . Cranial nerves II through XII grossly intact. Skin: Warm and dry. No rashes or lesions. Extremities: RLE with intact dressing   Data Reviewed: I have personally reviewed following labs and imaging studies  CBC: Recent Labs  Lab 11/03/18 0242 11/04/18 0154 11/05/18 0430 11/06/18 0306 11/07/18 0708  WBC 6.6 5.0 5.4 5.0 5.0  HGB 8.7* 8.0* 9.1* 8.6* 9.5*  HCT 27.5* 23.1* 28.4* 26.4* 29.7*  MCV 97.5 95.1 97.9 97.4 97.7  PLT 125* 161 172 174 [id number]   Basic Metabolic Panel: Recent Labs  Lab 11/02/18 0203 11/04/18 0154 11/05/18 0430 11/06/18 0306 11/07/18 0708  NA 137 136 138 139 140  K 4.2 4.0 3.8 3.7 3.6  CL 102 101 102 103 102  CO2 21* 19* 24 25 25   GLUCOSE 118* 145* 134* 118* 139*  BUN 20 18 22 21 16   CREATININE 1.07* 0.93 0.98 0.96 0.98  CALCIUM 7.6*  7.6* 7.9* 8.2* 8.0* 7.9*   MG 1.7  --  2.0 1.9 1.8  PHOS 2.9  --   --   --   --    GFR: Estimated Creatinine Clearance: 44.3 mL/min (by C-G formula based on SCr of 0.98 mg/dL). Liver Function Tests: Recent Labs  Lab 11/05/18 0430 11/06/18 0306 11/07/18 0708  AST 64* 54* 30  ALT 8 7 10   ALKPHOS 77 82 90  BILITOT 0.9 1.4* 1.0  PROT 6.1* 5.6* 5.5*  ALBUMIN 2.9* 2.8* 2.8*   Recent Labs  Lab 11/06/18 0306  LIPASE 29   No results for input(s): AMMONIA in the last 168 hours. Coagulation Profile: No results for input(s): INR, PROTIME in the last 168 hours. Cardiac Enzymes: No results for input(s): CKTOTAL, CKMB, CKMBINDEX, TROPONINI in the last 168 hours. BNP (last 3 results) No results for input(s): PROBNP in the last 8760 hours. HbA1C: No results for input(s): HGBA1C in the last 72 hours. CBG: Recent Labs  Lab 11/06/18 1210 11/06/18  1540 11/06/18 2104 11/07/18 0634 11/07/18 1237  GLUCAP 144* 118* 139* 137* 182*   Lipid Profile: No results for input(s): CHOL, HDL, LDLCALC, TRIG, CHOLHDL, LDLDIRECT in the last 72 hours. Thyroid Function Tests: Recent Labs    11/07/18 1332  TSH 0.978   Anemia Panel: No results for input(s): VITAMINB12, FOLATE, FERRITIN, TIBC, IRON, RETICCTPCT in the last 72 hours. Sepsis Labs: Recent Labs  Lab 11/04/18 0847  LATICACIDVEN 1.5    Recent Results (from the past 240 hour(s))  SARS CORONAVIRUS 2 (TAT 6-24 HRS) Nasopharyngeal Nasopharyngeal Swab     Status: None   Collection Time: 10/31/18 12:45 PM   Specimen: Nasopharyngeal Swab  Result Value Ref Range Status   SARS Coronavirus 2 NEGATIVE NEGATIVE Final    Comment: (NOTE) SARS-CoV-2 target nucleic acids are NOT DETECTED. The SARS-CoV-2 RNA is generally detectable in upper and lower respiratory specimens during the acute phase of infection. Negative results do not preclude SARS-CoV-2 infection, do not rule out co-infections with other pathogens, and should not be used as the sole basis for treatment or  other patient management decisions. Negative results must be combined with clinical observations, patient history, and epidemiological information. The expected result is Negative. Fact Sheet for Patients: SugarRoll.be Fact Sheet for Healthcare Providers: https://www.woods-mathews.com/ This test is not yet approved or cleared by the [place] FDA and  has been authorized for detection and/or diagnosis of SARS-CoV-2 by FDA under an Emergency Use Authorization (EUA). This EUA will remain  in effect (meaning this test can be used) for the duration of the COVID-19 declaration under Section 56 4(b)(1) of the Act, 21 U.S.C. section 360bbb-3(b)(1), unless the authorization is terminated or revoked sooner. Performed at [facility], [place] [address] [place], [place] [address]   Surgical pcr screen     Status: Abnormal   Collection Time: 10/31/18  5:50 PM   Specimen: Nasal Mucosa; Nasal Swab  Result Value Ref Range Status   MRSA, PCR POSITIVE (A) NEGATIVE Final    Comment: RESULT CALLED TO, READ BACK BY AND VERIFIED WITH: L COLLIE RN 1942 10/31/18 A BROWNING    Staphylococcus aureus POSITIVE (A) NEGATIVE Final    Comment: (NOTE) The Xpert SA Assay (FDA approved for NASAL specimens in patients 75 years of age and older), is one component of a comprehensive surveillance program. It is not intended to diagnose infection nor to guide or monitor treatment. Performed at [facility], [place] [address], [place], [place] [address]   SARS CORONAVIRUS 2 (TAT 6-24 HRS) Nasopharyngeal Nasopharyngeal Swab     Status: None   Collection Time: 11/05/18  6:19 AM   Specimen: Nasopharyngeal Swab  Result Value Ref Range Status   SARS Coronavirus 2 NEGATIVE NEGATIVE Final    Comment: (NOTE) SARS-CoV-2 target nucleic acids are NOT DETECTED. The SARS-CoV-2 RNA is generally detectable in upper and lower respiratory specimens during the acute phase of  infection. Negative results do not preclude SARS-CoV-2 infection, do not rule out co-infections with other pathogens, and should not be used as the sole basis for treatment or other patient management decisions. Negative results must be combined with clinical observations, patient history, and epidemiological information. The expected result is Negative. Fact Sheet for Patients: SugarRoll.be Fact Sheet for Healthcare Providers: https://www.woods-mathews.com/ This test is not yet approved or cleared by the [place] FDA and  has been authorized for detection and/or diagnosis of SARS-CoV-2 by FDA under an Emergency Use Authorization (EUA). This EUA will remain  in effect (meaning this test can be used) for the duration of the COVID-19 declaration under Section 56 4(b)(1) of the Act, 21 U.S.C. section 360bbb-3(b)(1), unless the authorization is terminated or revoked sooner. Performed at [facility], [place] [address] [place], [place] [address]          Radiology Studies: Ct Abdomen Pelvis W Contrast  Result Date: 11/05/2018 CLINICAL DATA:  Nausea vomiting. EXAM: CT ABDOMEN AND PELVIS WITH CONTRAST TECHNIQUE: Multidetector CT imaging of the abdomen and pelvis was performed using the standard protocol following bolus administration of intravenous contrast. CONTRAST:  125mL OMNIPAQUE IOHEXOL 300 MG/ML  SOLN COMPARISON:  Abdominal radiograph November 02, 2018, PET-CT May 05, 2017 FINDINGS: Lower chest: Right lower lobe calcified granuloma. Calcific atherosclerotic disease of the coronary arteries. Hepatobiliary: Normal appearance of the liver. Cholelithiasis without evidence of cholecystitis. Pancreas: Unremarkable. No pancreatic ductal dilatation or surrounding inflammatory changes. Spleen: Normal in size without focal abnormality. Adrenals/Urinary Tract: Normal adrenal glands. Exophytic right upper pole renal mass measures 2.9 cm.  Indeterminate hypoattenuated circumscribed mass in the upper pole of the left kidney. No evidence of hydronephrosis or nephrolithiasis. Stomach/Bowel: Stomach is within normal limits. Appendix appears normal. No evidence of bowel wall thickening, distention, or inflammatory changes. Vascular/Lymphatic: Aortic atherosclerosis. No enlarged abdominal or pelvic lymph nodes. Reproductive: Status post hysterectomy. No adnexal masses. Other: No abdominal wall hernia or abnormality. No abdominopelvic ascites. Musculoskeletal: Spondylosis of the spine. Moderate to advanced bilateral hip arthropathy. Partially visualized right femoral IM nail. IMPRESSION: 1. Exophytic right upper pole renal mass measures 2.9 cm, measured on prior PET-CT 2.3 cm. Indeterminate hypoattenuated circumscribed mass in the upper pole of the left kidney measures 1.3 cm. Renal cell carcinoma cannot be excluded for either of these lesions. Further evaluation with abdominal MRI, renal protocol, may be considered if further imaging evaluation is desired. 2. Cholelithiasis without evidence of acute cholecystitis. 3. Calcific atherosclerotic disease of the coronary arteries and aorta. 4. Moderate to advanced bilateral hip arthropathy. 5. Partially visualized right femoral IM nail. Edema of the right upper thigh musculature, and overlying subcutaneous stranding, likely postsurgical. Electronically Signed   By: Fidela Salisbury M.D.   On: 11/05/2018 19:41        Scheduled Meds: . acetaminophen  500 mg Oral Q8H  . amLODipine  5 mg Oral Daily  . aspirin EC  81 mg Oral Daily  . calcium citrate  200 mg of elemental calcium Oral BID  . carbidopa-levodopa  1 tablet Oral TID  . cholecalciferol  2,000 Units Oral BID  . citalopram  20 mg Oral Daily  . clonazePAM  1 mg Oral QHS  . cyanocobalamin  1,000 mcg Intramuscular Q1200  . docusate sodium  100 mg Oral BID  . enoxaparin (LOVENOX) injection  40 mg Subcutaneous Q24H  . erythromycin  1  application Right Eye [id number]  . [START ON 11/08/2018] erythromycin   Right Eye QHS  . feeding supplement (ENSURE ENLIVE)  237 mL Oral TID BM  . ferrous sulfate  325 mg Oral Q breakfast  . hydroxypropyl methylcellulose / hypromellose  1 drop Both Eyes QID  . influenza vaccine adjuvanted  0.5 mL Intramuscular Tomorrow-1000  . insulin aspart  0-15 Units Subcutaneous TID WC  . insulin aspart  0-5 Units Subcutaneous QHS  . metoprolol tartrate  12.5 mg Oral BID  . pantoprazole (PROTONIX) IV  40 mg Intravenous Q12H  . polyethylene glycol  17 g Oral BID  . primidone  250 mg Oral BID  .  simvastatin  10 mg Oral q1800  . vitamin C  500 mg Oral Daily  . Vitamin D (Ergocalciferol)  50,000 Units Oral Q7 days   Continuous Infusions: . sodium chloride 10 mL/hr at 11/02/18 1315  . lactated ringers 100 mL/hr at 11/03/18 1119     LOS: 7 days    Time spent: over 30 min    Fayrene Helper, MD [facility] Pager AMION  If 7PM-7AM, please contact night-coverage [email or web address] Password [email or web address] 11/07/2018, 3:08 PM

## 2018-11-07 NOTE — Progress Notes (Addendum)
ANTICOAGULATION CONSULT NOTE - Initial Consult  Pharmacy Consult for Apixaban (Eliquis) Indication: atrial fibrillation  Allergies  Allergen Reactions  . Statins Other (See Comments)    Patient Measurements: Height: 5\' 5"  (165.1 cm) Weight: 155 lb (70.3 kg) IBW/kg (Calculated) : 57   Vital Signs: Temp: 98.2 F (36.8 C) (10/12 0807) Temp Source: Oral (10/12 0807) BP: 127/56 (10/12 0807) Pulse Rate: 73 (10/12 0807)  Labs: Recent Labs    11/05/18 0430 11/06/18 0306 11/07/18 0708  HGB 9.1* 8.6* 9.5*  HCT 28.4* 26.4* 29.7*  PLT 172 174 237  CREATININE 0.98 0.96 0.98    Estimated Creatinine Clearance: 44.3 mL/min (by C-G formula based on SCr of 0.98 mg/dL).   Medical History: Past Medical History:  Diagnosis Date  . Diabetes ([place])   . Essential tremor   . Fall 06/07/2017   rib fractures  . GERD (gastroesophageal reflux disease)   . Hypertension   . Peripheral neuropathy     Assessment: 84 yr old female with medical history significant for HTN, DM, GERD, CKD, essential tremor, pulmonary HTN, anemia, renal mass who presented to ED with distal R femur fx; pt S/P retrograde IMN on 11/01/18. Pt developed a fib post-op. Pharmacy is consulted to dose apixaban for a fib in this patient.  H/H 9.5/29.7 (stable); platelets 237. Scr 0.98; TBW CrCl ~50 ml/min  Pt has been receiving Lovenox 40 mg SQ daily post-op (last dose at 10:20 AM today, 11/07/18)  Goal of Therapy:  Prevention of stroke secondary to a fib Monitor platelets by anticoagulation protocol: Yes   Plan:  Start apixaban 5 mg po BID this evening Monitor CBC, signs/symptoms of bleeding Monitor for signs/symptoms of stroke  Gillermina Hu, PharmD, BCPS, [facility] Clinical Pharmacist 11/07/2018,5:35 PM

## 2018-11-07 NOTE — Care Management (Signed)
CM consult for Eliquis acknowledged; patient is transitioning to a ST SNF at discharge; will defer to the next LOC.  Midge Minium RN, BSN, NCM-BC, ACM-RN [phone]

## 2018-11-07 NOTE — Progress Notes (Addendum)
Orthopedic Trauma Service Progress Note  Patient ID: Kristen Mason MRN: [id number] DOB/AGE: 1934-08-13 84 y.o.  Subjective:  Sleeping  Appears comfortable Had some issues with elevated HR last night, per on call provider sticky note looks like PAF. Currently HR is 72 and pt without complaints  Decreased R eye pain   Did not get up with therapies yesterday   H/H looks good this am    ROS As above  Objective:   VITALS:   Vitals:   11/07/18 0239 11/07/18 0244 11/07/18 0318 11/07/18 0807  BP: 129/87   (!) 127/56  Pulse: (!) 130 (!) 119 93 73  Resp:    16  Temp: (!) 97.5 F (36.4 C)   98.2 F (36.8 C)  TempSrc: Oral   Oral  SpO2: 92%   95%  Weight:      Height:        Estimated body mass index is 25.79 kg/m as calculated from the following:   Height as of this encounter: 5\' 5"  (1.651 m).   Weight as of this encounter: 70.3 kg.   Intake/Output      10/11 0701 - 10/12 0700 10/12 0701 - 10/13 0700   P.O. 480    I.V. (mL/kg) 0 (0)    IV Piggyback 500    Total Intake(mL/kg) 980 (13.9)    Urine (mL/kg/hr) 1700 (1)    Total Output 1700    Net -720         Urine Occurrence 2 x      LABS  Results for orders placed or performed during the hospital encounter of 10/31/18 (from the past 24 hour(s))  Glucose, capillary     Status: Abnormal   Collection Time: 11/06/18 12:10 PM  Result Value Ref Range   Glucose-Capillary 144 (H) 70 - 99 mg/dL  Glucose, capillary     Status: Abnormal   Collection Time: 11/06/18  3:40 PM  Result Value Ref Range   Glucose-Capillary 118 (H) 70 - 99 mg/dL  Glucose, capillary     Status: Abnormal   Collection Time: 11/06/18  9:04 PM  Result Value Ref Range   Glucose-Capillary 139 (H) 70 - 99 mg/dL   Comment 1 Document in Chart   Glucose, capillary     Status: Abnormal   Collection Time: 11/07/18  6:34 AM  Result Value Ref Range   Glucose-Capillary 137 (H) 70 -  99 mg/dL   Comment 1 Document in Chart   CBC     Status: Abnormal   Collection Time: 11/07/18  7:08 AM  Result Value Ref Range   WBC 5.0 4.0 - 10.5 K/uL   RBC 3.04 (L) 3.87 - 5.11 MIL/uL   Hemoglobin 9.5 (L) 12.0 - 15.0 g/dL   HCT 29.7 (L) 36.0 - 46.0 %   MCV 97.7 80.0 - 100.0 fL   MCH 31.3 26.0 - 34.0 pg   MCHC 32.0 30.0 - 36.0 g/dL   RDW 16.1 (H) 11.5 - 15.5 %   Platelets 237 150 - 400 K/uL   nRBC 0.0 0.0 - 0.2 %  Comprehensive metabolic panel     Status: Abnormal   Collection Time: 11/07/18  7:08 AM  Result Value Ref Range   Sodium 140 135 - 145 mmol/L   Potassium 3.6 3.5 - 5.1 mmol/L   Chloride  102 98 - 111 mmol/L   CO2 25 22 - 32 mmol/L   Glucose, Bld 139 (H) 70 - 99 mg/dL   BUN 16 8 - 23 mg/dL   Creatinine, Ser 0.98 0.44 - 1.00 mg/dL   Calcium 7.9 (L) 8.9 - 10.3 mg/dL   Total Protein 5.5 (L) 6.5 - 8.1 g/dL   Albumin 2.8 (L) 3.5 - 5.0 g/dL   AST 30 15 - 41 U/L   ALT 10 0 - 44 U/L   Alkaline Phosphatase 90 38 - 126 U/L   Total Bilirubin 1.0 0.3 - 1.2 mg/dL   GFR calc non Af Amer 54 (L) >60 mL/min   GFR calc Af Amer >60 >60 mL/min   Anion gap 13 5 - 15  Magnesium     Status: None   Collection Time: 11/07/18  7:08 AM  Result Value Ref Range   Magnesium 1.8 1.7 - 2.4 mg/dL     PHYSICAL EXAM:   Gen: resting comfortably, easily arousable  Lungs: unlabored Ext:       Right lower extremity   TED hose in place  Ext warm   + DP pulse  Swelling well controlled  Dressings c/d/i  Motor and sensory functions grossly intact   Assessment/Plan: 6 Days Post-Op   Principal Problem:   Femur fracture, right (HCC) Active Problems:   Controlled diabetes mellitus ([place])   Essential hypertension   Essential tremor   Acute on chronic kidney failure (HCC)   Macrocytic anemia   Abdominal pain   Anti-infectives (From admission, onward)   Start     Dose/Rate Route Frequency Ordered Stop   11/01/18 2330  ceFAZolin (ANCEF) IVPB 1 g/50 mL premix     1 g 100 mL/hr over 30  Minutes Intravenous Every 8 hours 11/01/18 2029 11/02/18 1624   11/01/18 1400  ceFAZolin (ANCEF) IVPB 2g/100 mL premix     2 g 200 mL/hr over 30 Minutes Intravenous  Once 11/01/18 1350 11/01/18 1533    .  POD/HD#: 50  84 y/o female s/p ground level fall with R supracondylar distal femur fracture    -fall   - closed R supracondylar distal femur fracture s/p retrograde IMN              TDWB R leg             Unrestricted ROM R knee and hip (at baseline she is restricted due to advanced endstage DJD)             PT/OT             Dressing changes as needed                          Ok to change dressing as needed                         Ok to clean with soap and water              Ice and elevate              Float heels off bed to prevent pressure sores and to keep knee in full extension when at rest so as to not exacerbate her contractures any further              No pillows under knee- place under ankle to keep knee in full extension when at rest  TED hose                 - Pain management:            scheduled tylenol              ultram    - ABL anemia/Hemodynamics              Stable    - Medical issues              Per medical team                          Refractory nausea                                     appears improved                                     Monitor                                      Continue bowel regimen              Diabetes                          On SSI    R eye pain    Erythromycin ointment    - DVT/PE prophylaxis:             Lovenox x 4 weeks (week 1 of 4 completed)   - ID:              periop abx completed    - Metabolic Bone Disease:             profound vitamin d deficiency                          vitamin d3 2000 IU BID                         Also start vitamin d2 50000 weekly                          Vitamin c                          Calcitriol levels suggest long standing vitamin d deficiency and along with  PTH levels suggest calcium deficiency                                        Will need DEXA in 4-8 weeks             Fracture indicative of osteoporosis                          Will likely need pharmacologic treatment of her osteoporosis  Would strongly favor starting treatment of her osteoporosis before considering Total joint replacement    - Activity:             TDWB R leg    - FEN/GI prophylaxis/Foley/Lines:             carb mod diet, assistance with feeding due to tremors                - Impediments to fracture healing:             Osteoporosis             DM             Falls/balance issues             Psychotropic medication    - Dispo:             Therapy             will need SNF              Ortho issues improving   Re-consulted SW to arrange SNF      Jari Pigg, PA-C [phone] (C) 11/07/2018, 8:28 AM  [facility] [place] [phone] Domingo Sep (F)

## 2018-11-07 NOTE — Progress Notes (Signed)
Physical Therapy Treatment Patient Details Name: Kristen Mason MRN: [id number] DOB: 09/11/1934 Today's Date: 11/07/2018    History of Present Illness 84 yo admitted after fall at home with right femur fx s/p IM nail and right calcaneal fx non-op. PMhx: HTN, DM, CKD, neuropathy, bil knee flexion contracture due to endstage OA    PT Comments    Pt admitted with above diagnosis. Pt was able to pivot to chair with RW with mod assist of 2 persons with max cues for technique.  Progressing slowly. Needs SNF.  Pt currently with functional limitations due to balance and endurance deficits. Pt will benefit from skilled PT to increase their independence and safety with mobility to allow discharge to the venue listed below.     Follow Up Recommendations  SNF     Equipment Recommendations  None recommended by PT    Recommendations for Other Services       Precautions / Restrictions Precautions Precautions: Fall Restrictions Weight Bearing Restrictions: Yes RLE Weight Bearing: Touchdown weight bearing    Mobility  Bed Mobility Overal bed mobility: Needs Assistance Bed Mobility: Supine to Sit     Supine to sit: Mod assist;HOB elevated;+2 for physical assistance     General bed mobility comments: Pt able to progress LEs off EOB and pivot hips. Assist given to elevate trunk and scoot forward to EOB.  Transfers Overall transfer level: Needs assistance Equipment used: Rolling walker (2 wheeled) Transfers: Sit to/from [employer] Sit to Stand: Max assist;+2 physical assistance(Using pad under hips) Stand pivot transfers: Mod assist;+2 physical assistance Squat pivot transfers: (Pad under hips)     General transfer comment: Pt stood from EOB with max A +2 using pad under hips. Took incr time but pt was able to come fully upright. Was able to take  pivotal steps  to recliner chair with max cues and assist to move RW.  Pt did sit prior to getting squared up to chair  however was close enough. Pts R LE able to maintain TDWB with cues.   Ambulation/Gait             General Gait Details: unable   Stairs             Wheelchair Mobility    Modified Rankin (Stroke Patients Only)       Balance Overall balance assessment: Needs assistance Sitting-balance support: No upper extremity supported Sitting balance-Leahy Scale: Fair     Standing balance support: Bilateral upper extremity supported Standing balance-Leahy Scale: Poor Standing balance comment: bil UE support and external support in standing with use of RW                            Cognition Arousal/Alertness: Awake/alert Behavior During Therapy: WFL for tasks assessed/performed Overall Cognitive Status: Within Functional Limits for tasks assessed Area of Impairment: Safety/judgement;Problem solving                         Safety/Judgement: Decreased awareness of deficits;Decreased awareness of safety   Problem Solving: Slow processing        Exercises General Exercises - Lower Extremity Ankle Circles/Pumps: AROM;Both;10 reps;Supine Quad Sets: AROM;Both;10 reps;Supine Long Arc Quad: AROM;Both;10 reps;Seated    General Comments        Pertinent Vitals/Pain Pain Assessment: 0-10 Pain Score: 8  Pain Location: RLE with movement Pain Descriptors / Indicators: Aching;Guarding Pain Intervention(s): Limited activity within patient's tolerance;Monitored during  session;Premedicated before session;Repositioned    Home Living                      Prior Function            PT Goals (current goals can now be found in the care plan section) Acute Rehab PT Goals Patient Stated Goal: "I want to feel better" Progress towards PT goals: Progressing toward goals    Frequency    Min 3X/week      PT Plan Current plan remains appropriate    Co-evaluation              AM-PAC PT "6 Clicks" Mobility   Outcome Measure  Help needed  turning from your back to your side while in a flat bed without using bedrails?: A Lot Help needed moving from lying on your back to sitting on the side of a flat bed without using bedrails?: A Lot Help needed moving to and from a bed to a chair (including a wheelchair)?: A Lot Help needed standing up from a chair using your arms (e.g., wheelchair or bedside chair)?: Total Help needed to walk in hospital room?: Total Help needed climbing 3-5 steps with a railing? : Total 6 Click Score: 9    End of Session Equipment Utilized During Treatment: Gait belt Activity Tolerance: Patient limited by fatigue Patient left: in chair;with call bell/phone within reach;with chair alarm set Nurse Communication: Mobility status PT Visit Diagnosis: Other abnormalities of gait and mobility (R26.89);Muscle weakness (generalized) (M62.81);History of falling (Z91.81)     Time: [id number] PT Time Calculation (min) (ACUTE ONLY): 17 min  Charges:  $Therapeutic Activity: 8-22 mins                     [place] Pager:  [phone]  Office:  [place] 11/07/2018, 2:20 PM

## 2018-11-07 NOTE — Progress Notes (Signed)
Nutrition Follow-up  DOCUMENTATION CODES:   Not applicable  INTERVENTION:  Provide Glucerna Shake po TID, each supplement provides 220 kcal and 10 grams of protein  Encourage adequate PO intake.   NUTRITION DIAGNOSIS:   Increased nutrient needs related to post-op healing as evidenced by estimated needs; ongoing  GOAL:   Patient will meet greater than or equal to 90% of their needs; progressing  MONITOR:   PO intake, Supplement acceptance, Skin, Weight trends, Labs, I & O's  REASON FOR ASSESSMENT:   Consult Assessment of nutrition requirement/status  ASSESSMENT:   84 y.o. female past medical history significant for hypertension diabetes chronic kidney disease presents to the ED with right neck pain after fall.  PROCEDURE (10/6): INTRAMEDULLARY (IM) RETROGRADE FEMORAL NAILING (Right)  Meal completion has been varied from 25-75%. Pt reports having a good appetite with usual consumption of at least 2 meals a day. Pt lives with daughter, who cooks and helps feed her. Pt is needing feeding assistance due to hand tremors. NT and RN has been aiding to feed pt at meals. Pt currently has Ensure ordered with varied consumption. Pt reports dislike of Ensure due to high sugar content. RD to order Glucerna shake instead. Pt encouraged to eat her food at meals and to drink her supplements.   NUTRITION - FOCUSED PHYSICAL EXAM:    Most Recent Value  Orbital Region  No depletion  Upper Arm Region  No depletion  Thoracic and Lumbar Region  No depletion  Buccal Region  No depletion  Temple Region  No depletion  Clavicle Bone Region  No depletion  Clavicle and Acromion Bone Region  No depletion  Scapular Bone Region  No depletion  Dorsal Hand  No depletion  Patellar Region  No depletion  Anterior Thigh Region  No depletion  Posterior Calf Region  No depletion  Edema (RD Assessment)  Mild  Hair  Reviewed  Eyes  Reviewed  Mouth  Reviewed  Skin  Reviewed  Nails  Reviewed     Labs  and medications reviewed.   Diet Order:   Diet Order            Diet Carb Modified Fluid consistency: Thin; Room service appropriate? Yes  Diet effective now              EDUCATION NEEDS:   Not appropriate for education at this time  Skin:  Skin Assessment: Skin Integrity Issues: Skin Integrity Issues:: Incisions Incisions: R knee  Last BM:  10/10  Height:   Ht Readings from Last 1 Encounters:  11/01/18 5\' 5"  (1.651 m)    Weight:   Wt Readings from Last 1 Encounters:  11/01/18 70.3 kg    Ideal Body Weight:  56.8 kg  BMI:  Body mass index is 25.79 kg/m.  Estimated Nutritional Needs:   Kcal:  1700-1850  Protein:  75-90 grams  Fluid:  >/= 1.7 L/day    Corrin Parker, MS, RD, LDN Pager # [phone] After hours/ weekend pager # [phone]

## 2018-11-07 NOTE — Progress Notes (Signed)
  Echocardiogram 2D Echocardiogram has been performed.  Kristen Mason 11/07/2018, 12:25 PM

## 2018-11-07 NOTE — Care Management (Signed)
Patient has a bed available at [facility] once medically stable to discharge.   Midge Minium RN, BSN, NCM-BC, ACM-RN [phone]

## 2018-11-08 ENCOUNTER — Inpatient Hospital Stay (HOSPITAL_COMMUNITY): Payer: Medicare Other

## 2018-11-08 LAB — GLUCOSE, CAPILLARY
Glucose-Capillary: 133 mg/dL — ABNORMAL HIGH (ref 70–99)
Glucose-Capillary: 119 mg/dL — ABNORMAL HIGH (ref 70–99)
Glucose-Capillary: 128 mg/dL — ABNORMAL HIGH (ref 70–99)
Glucose-Capillary: 125 mg/dL — ABNORMAL HIGH (ref 70–99)
Glucose-Capillary: 117 mg/dL — ABNORMAL HIGH (ref 70–99)

## 2018-11-08 MED ORDER — VITAMIN B-12 1000 MCG PO TABS
1000.0000 ug | ORAL_TABLET | Freq: Every day | ORAL | Status: DC
  Administered 2018-11-09: 1000 ug via ORAL
  Filled 2018-11-08: qty 1

## 2018-11-08 MED ORDER — HYDROCORTISONE (PERIANAL) 2.5 % EX CREA
TOPICAL_CREAM | Freq: Two times a day (BID) | CUTANEOUS | Status: DC | PRN
  Administered 2018-11-08 – 2018-11-09 (×2): via RECTAL
  Filled 2018-11-08: qty 28.35

## 2018-11-08 MED ORDER — IOHEXOL 350 MG/ML SOLN
100.0000 mL | Freq: Once | INTRAVENOUS | Status: AC | PRN
  Administered 2018-11-08: 100 mL via INTRAVENOUS

## 2018-11-08 MED ORDER — PHENYLEPH-SHARK LIV OIL-MO-PET 0.25-3-14-71.9 % RE OINT
TOPICAL_OINTMENT | Freq: Two times a day (BID) | RECTAL | Status: DC | PRN
  Filled 2018-11-08: qty 28.4

## 2018-11-08 NOTE — Progress Notes (Signed)
PROGRESS NOTE    Kristen Mason  [id number] DOB: [birth year] DOA: 10/31/2018 PCP: Wenda Low, MD   Brief Narrative:  Kristen Mason is an 84 y.o. female with medical history significant ofhypertension, diabetes, GERD, chronic kidney disease,  Renal mass,presents to emergency department with right leg pain following a fall.  Found to have distal R femoral fracture.  Now s/p IM nail. Plan for SNF, but pt with continued N/V and abdominal pain.  Planning for CT abdomen/pelvis.    --------------------------------------------------------------------------------------------------------------------------------------------------   Subjective:   The patient was seen examined this morning, remained stable in bed, no acute distress.  No issues overnight.   Reporting her nausea vomiting has somewhat improved, she is willing to try and advance her p.o. intake today   --------------------------------------------------------------------------------------------------------------------------------------------------   Assessment & Plan:   Principal Problem:   Femur fracture, right ([place]) Active Problems:   Controlled diabetes mellitus ([place])   Essential hypertension   Essential tremor   Acute on chronic kidney failure (HCC)   Macrocytic anemia   Abdominal pain   Femur fracture, right (H[facility]) - remains stable,  Closed right supracondylar distal femur fracture status post retrograde IMN. Unrestricted range of motion to the right knee and right hip, touch down weight bearing to R leg Dressing over wound . Awaiting physical therapy evaluation, float heels to prevent pressure ulcer.  Keep knee in full extension when at rest.  Ortho c/s, appreciate recs   Lovenox for DVT ppx x 4 weeks Needs outpatient osteoporosis workup and treatment    Atrial Fibrillation:  -Heart rate has stabilized,  Has transition to NSR Developed post op.  Chadsvasc is 4.  Candidate for anticoagulation.  Will discuss  with ortho prior to starting.  Plan to call daughter as well.  Pt is amenable to anticoagulation after discussion. Follow TSH (wnl) Start on metoprolol  Echo with normal EF 60-65%, mildly dilated LA, mod dilated RA, mod pulm HTN, grade 2 diastolic dysfunction, severe RV dysfunction   - with recent surgery, new afib and severe RV dysfunction -    we followed CT -PE protocol with CTA of the chest which was negative for pulmonary embolism,  incidental finding of stable 1.3 x 0.9 cm nodule on the left upper lobe compared to 06/07/2017 no changes Will need outpatient cardiology follow up   Pulmonary Hypertension   Severe RV Dysfunction:  - stable She'll need outpatient follow up with cardiology I/O, daily weights  Nausea   Vomiting   Malaise   Abdominal Pain: - continue to improve, will initiate and  encourage p.o. intake today CT abdomen pelvis without acute findings Labs with slightly elevated bili this AM, CT yesterday without evidence of cholecystitis  Continue zofran, PPI, miralax Continue supportive care -dietician c/s  Fracture/avulsion of lateral calcaneus: No intervention at this time.  Essential hypertension: Restart amlodipine Resume lisinopril   Diabetes mellitus type 2: Hemoglobin A1c is 6.2 continue sliding scale insulin.  Acute blood loss anemia/microcytosis: Continue IM vitamin b12 Iron/ferritin borderline, will start iron supplementation Continue to follow  Acute kidney injury: - creatinine stabilized Likely prerenal azotemia resolved with IV fluid hydration in the setting of NSAID use.  AGMA Mild, lactate negative and VBG without acidemia (7.44/35.8), continue to monitor  Essential tremor Continue primidone.  Eye Pain   Itching   Subconjunctival Hemorrhage to L Eye:  - no complaint today, improved were artificial tears,  And antibiotics  Main complaint today is R eye pain.  Possible corneal abrasion in OR.  Discussed with  ophtho on 10/10,  recommended erythromycin ointment TID x 3 days, then nightly x 4 days.  Consider f/u with ophtho if no improvement.  Renal Mass:  Seen on PET scan in 2019, will get imaging here as pt going to be d/c'd to SNF and this would be otherwise delayed.   MRI of abdomen pelvis  Ordered, pending - family is aware to follow-up as outpatient   DVT prophylaxis:   Eliquis Code Status: full  Family Communication: daughter over phone Disposition Plan: hopefully d/c to SNF when N/V and malaise and poor PO improves, mild improvement today, but not to baseline as well as new onset afib needing w/u.    Consultants:   orthopedics  Procedures:  10/6 INTRAMEDULLARY (IM) RETROGRADE FEMORAL NAILING (Right) WITH BIOMET [place] 10.5 X 380 MM STATICALLY LOCKED  Antimicrobials:  Anti-infectives (From admission, onward)   Start     Dose/Rate Route Frequency Ordered Stop   11/01/18 2330  ceFAZolin (ANCEF) IVPB 1 g/50 mL premix     1 g 100 mL/hr over 30 Minutes Intravenous Every 8 hours 11/01/18 2029 11/02/18 1624   11/01/18 1400  ceFAZolin (ANCEF) IVPB 2g/100 mL premix     2 g 200 mL/hr over 30 Minutes Intravenous  Once 11/01/18 1350 11/01/18 1533         Objective: Vitals:   11/07/18 1924 11/08/18 0317 11/08/18 0910 11/08/18 1501  BP: (!) 146/67 (!) 141/57 (!) 170/63 (!) 160/55  Pulse: 68 64 69 (!) 59  Resp: 18  16 16   Temp: 98.8 F (37.1 C) 98.1 F (36.7 C) 98.2 F (36.8 C) 98.6 F (37 C)  TempSrc: Oral Oral Oral Oral  SpO2: 96% 90% 94% 96%  Weight:      Height:        Intake/Output Summary (Last 24 hours) at 11/08/2018 1524 Last data filed at 11/08/2018 1300 Gross per 24 hour  Intake 240 ml  Output 600 ml  Net -360 ml   Filed Weights   10/31/18 1120 11/01/18 1412  Weight: 70.3 kg 70.3 kg     Physical Exam  Constitution:  Alert, cooperative, no distress,  Psychiatric: Normal and stable mood and affect, cognition intact,   HEENT: Normocephalic, PERRL, otherwise with in Normal  limits  Chest:Chest symmetric Cardio vascular:  S1/S2, RRR, No murmure, No Rubs or Gallops  pulmonary: Clear to auscultation bilaterally, respirations unlabored, negative wheezes / crackles Abdomen: Soft, non-tender, non-distended, bowel sounds,no masses, no organomegaly Muscular skeletal: Limited exam - in bed, able to move all 4 extremities, Normal strength,  Neuro: CNII-XII intact. , normal motor and sensation, reflexes intact  Extremities: No pitting edema lower extremities, +2 pulses  Skin: Dry, warm to touch, negative for any Rashes,   Right lower extremity-Postsurgical wound  With dressing Wounds: per nursing documentation -  Right lower extremity surgical wound dressing in place negative any draining erythema edema mild tenderness      Data Reviewed: I have personally reviewed following labs and imaging studies  CBC: Recent Labs  Lab 11/03/18 0242 11/04/18 0154 11/05/18 0430 11/06/18 0306 11/07/18 0708  WBC 6.6 5.0 5.4 5.0 5.0  HGB 8.7* 8.0* 9.1* 8.6* 9.5*  HCT 27.5* 23.1* 28.4* 26.4* 29.7*  MCV 97.5 95.1 97.9 97.4 97.7  PLT 125* 161 172 174 [id number]   Basic Metabolic Panel: Recent Labs  Lab 11/02/18 0203 11/04/18 0154 11/05/18 0430 11/06/18 0306 11/07/18 0708  NA 137 136 138 139 140  K 4.2 4.0 3.8 3.7 3.6  CL 102  101 102 103 102  CO2 21* 19* 24 25 25   GLUCOSE 118* 145* 134* 118* 139*  BUN 20 18 22 21 16   CREATININE 1.07* 0.93 0.98 0.96 0.98  CALCIUM 7.6*   7.6* 7.9* 8.2* 8.0* 7.9*  MG 1.7  --  2.0 1.9 1.8  PHOS 2.9  --   --   --   --    GFR: Estimated Creatinine Clearance: 44.3 mL/min (by C-G formula based on SCr of 0.98 mg/dL). Liver Function Tests: Recent Labs  Lab 11/05/18 0430 11/06/18 0306 11/07/18 0708  AST 64* 54* 30  ALT 8 7 10   ALKPHOS 77 82 90  BILITOT 0.9 1.4* 1.0  PROT 6.1* 5.6* 5.5*  ALBUMIN 2.9* 2.8* 2.8*   Recent Labs  Lab 11/06/18 0306  LIPASE 29  CBG: Recent Labs  Lab 11/07/18 1237 11/07/18 1653 11/08/18 0639  11/08/18 1156 11/08/18 1344  GLUCAP 182* 113* 133* 119* 128*   Lipid Profile: No results for input(s): CHOL, HDL, LDLCALC, TRIG, CHOLHDL, LDLDIRECT in the last 72 hours. Thyroid Function Tests: Recent Labs    11/07/18 1332  TSH 0.978   Anemia Panel: No results for input(s): VITAMINB12, FOLATE, FERRITIN, TIBC, IRON, RETICCTPCT in the last 72 hours. Sepsis Labs: Recent Labs  Lab 11/04/18 0847  LATICACIDVEN 1.5    Recent Results (from the past 240 hour(s))  SARS CORONAVIRUS 2 (TAT 6-24 HRS) Nasopharyngeal Nasopharyngeal Swab     Status: None   Collection Time: 10/31/18 12:45 PM   Specimen: Nasopharyngeal Swab  Result Value Ref Range Status   SARS Coronavirus 2 NEGATIVE NEGATIVE Final    Comment: (NOTE) SARS-CoV-2 target nucleic acids are NOT DETECTED. The SARS-CoV-2 RNA is generally detectable in upper and lower respiratory specimens during the acute phase of infection. Negative results do not preclude SARS-CoV-2 infection, do not rule out co-infections with other pathogens, and should not be used as the sole basis for treatment or other patient management decisions. Negative results must be combined with clinical observations, patient history, and epidemiological information. The expected result is Negative. Fact Sheet for Patients: SugarRoll.be Fact Sheet for Healthcare Providers: https://www.woods-mathews.com/ This test is not yet approved or cleared by the [place] FDA and  has been authorized for detection and/or diagnosis of SARS-CoV-2 by FDA under an Emergency Use Authorization (EUA). This EUA will remain  in effect (meaning this test can be used) for the duration of the COVID-19 declaration under Section 56 4(b)(1) of the Act, 21 U.S.C. section 360bbb-3(b)(1), unless the authorization is terminated or revoked sooner. Performed at [facility], [place] [address] [place], [place] [address]   Surgical pcr screen      Status: Abnormal   Collection Time: 10/31/18  5:50 PM   Specimen: Nasal Mucosa; Nasal Swab  Result Value Ref Range Status   MRSA, PCR POSITIVE (A) NEGATIVE Final    Comment: RESULT CALLED TO, READ BACK BY AND VERIFIED WITH: L COLLIE RN 1942 10/31/18 A BROWNING    Staphylococcus aureus POSITIVE (A) NEGATIVE Final    Comment: (NOTE) The Xpert SA Assay (FDA approved for NASAL specimens in patients 25 years of age and older), is one component of a comprehensive surveillance program. It is not intended to diagnose infection nor to guide or monitor treatment. Performed at [facility], [place] [address] [place], [place] [address]   SARS CORONAVIRUS 2 (TAT 6-24 HRS) Nasopharyngeal Nasopharyngeal Swab     Status: None   Collection Time: 11/05/18  6:19 AM  Specimen: Nasopharyngeal Swab  Result Value Ref Range Status   SARS Coronavirus 2 NEGATIVE NEGATIVE Final    Comment: (NOTE) SARS-CoV-2 target nucleic acids are NOT DETECTED. The SARS-CoV-2 RNA is generally detectable in upper and lower respiratory specimens during the acute phase of infection. Negative results do not preclude SARS-CoV-2 infection, do not rule out co-infections with other pathogens, and should not be used as the sole basis for treatment or other patient management decisions. Negative results must be combined with clinical observations, patient history, and epidemiological information. The expected result is Negative. Fact Sheet for Patients: SugarRoll.be Fact Sheet for Healthcare Providers: https://www.woods-mathews.com/ This test is not yet approved or cleared by the [place] FDA and  has been authorized for detection and/or diagnosis of SARS-CoV-2 by FDA under an Emergency Use Authorization (EUA). This EUA will remain  in effect (meaning this test can be used) for the duration of the COVID-19 declaration under Section 56 4(b)(1) of the Act, 21 U.S.C. section  360bbb-3(b)(1), unless the authorization is terminated or revoked sooner. Performed at [facility], [place] [address], [place], [place] [address]          Radiology Studies: Ct Angio Chest Pe W Or Wo Contrast  Result Date: 11/08/2018 CLINICAL DATA:  84 year old female with history of new onset of atrial fibrillation. Right ventricular dysfunction. EXAM: CT ANGIOGRAPHY CHEST WITH CONTRAST TECHNIQUE: Multidetector CT imaging of the chest was performed using the standard protocol during bolus administration of intravenous contrast. Multiplanar CT image reconstructions and MIPs were obtained to evaluate the vascular anatomy. CONTRAST:  113mL OMNIPAQUE IOHEXOL 350 MG/ML SOLN COMPARISON:  Chest CTA 06/07/2017. FINDINGS: Cardiovascular: No filling defects within the pulmonary arterial tree to suggest underlying pulmonary embolism. Heart size is normal. There is no significant pericardial fluid, thickening or pericardial calcification. There is aortic atherosclerosis, as well as atherosclerosis of the great vessels of the mediastinum and the coronary arteries, including calcified atherosclerotic plaque in the left main, left anterior descending, left circumflex and right coronary arteries. Mediastinum/Nodes: Severe asymmetric enlargement of the thyroid gland (right lobe greater than left) which is heterogeneous in appearance with multiple partially calcified nodular areas, very similar to the prior examination, nonspecific but most compatible with a multinodular goiter. No pathologically enlarged mediastinal or hilar lymph nodes. Esophagus is unremarkable in appearance. No axillary lymphadenopathy. Lungs/Pleura: Calcified granuloma in the posterior aspect of the right lower lobe. In the periphery of the left upper lobe (axial image 58 of series 7) there is a 1.3 x 0.9 cm nodular density which appears very similar to prior study from 06/07/2017, presumably benign. No other definite suspicious appearing  pulmonary nodules or masses are noted. No acute consolidative airspace disease. No pleural effusions. Upper Abdomen: Calcified gallstones in the gallbladder. Aortic atherosclerosis. Numerous colonic diverticulae in the region of the splenic flexure of the colon. Musculoskeletal: There are no aggressive appearing lytic or blastic lesions noted in the visualized portions of the skeleton. Review of the MIP images confirms the above findings. IMPRESSION: 1. No evidence of pulmonary embolism. 2. No acute findings are noted in the thorax to account for the patient's symptoms. 3. Aortic atherosclerosis, in addition to left main and 3 vessel coronary artery disease. 4. Stable 1.3 x 0.9 cm nodular density in the periphery of the left upper lobe compared to 06/07/2017, presumably benign. 5. Cholelithiasis. 6. Colonic diverticulosis. 7. Asymmetric multinodular goiter redemonstrated. Aortic Atherosclerosis (ICD10-I70.0). Electronically Signed   By: Vinnie Langton M.D.   On: 11/08/2018 13:00  Scheduled Meds:  acetaminophen  500 mg Oral Q8H   amLODipine  5 mg Oral Daily   apixaban  5 mg Oral BID   aspirin EC  81 mg Oral Daily   calcium citrate  200 mg of elemental calcium Oral BID   carbidopa-levodopa  1 tablet Oral TID   cholecalciferol  2,000 Units Oral BID   citalopram  20 mg Oral Daily   clonazePAM  1 mg Oral QHS   cyanocobalamin  1,000 mcg Intramuscular Q1200   docusate sodium  100 mg Oral BID   erythromycin   Right Eye QHS   feeding supplement (GLUCERNA SHAKE)  237 mL Oral TID WC   ferrous sulfate  325 mg Oral Q breakfast   hydroxypropyl methylcellulose / hypromellose  1 drop Both Eyes QID   influenza vaccine adjuvanted  0.5 mL Intramuscular Tomorrow-1000   insulin aspart  0-15 Units Subcutaneous TID WC   insulin aspart  0-5 Units Subcutaneous QHS   metoprolol tartrate  12.5 mg Oral BID   pantoprazole (PROTONIX) IV  40 mg Intravenous Q12H   polyethylene glycol  17 g  Oral BID   primidone  250 mg Oral BID   simvastatin  10 mg Oral q1800   vitamin C  500 mg Oral Daily   Vitamin D (Ergocalciferol)  50,000 Units Oral Q7 days   Continuous Infusions:  sodium chloride 10 mL/hr at 11/02/18 1315   lactated ringers 100 mL/hr at 11/03/18 1119     LOS: 8 days    Time spent: over 30 min    Deatra James, MD [facility] Pager AMION  If 7PM-7AM, please contact night-coverage [email or web address] Password [email or web address] 11/08/2018, 3:24 PM

## 2018-11-09 ENCOUNTER — Inpatient Hospital Stay (HOSPITAL_COMMUNITY): Payer: Medicare Other

## 2018-11-09 LAB — BASIC METABOLIC PANEL
Anion gap: 12 (ref 5–15)
BUN: 16 mg/dL (ref 8–23)
CO2: 25 mmol/L (ref 22–32)
Calcium: 8.2 mg/dL — ABNORMAL LOW (ref 8.9–10.3)
Chloride: 101 mmol/L (ref 98–111)
Creatinine, Ser: 0.96 mg/dL (ref 0.44–1.00)
GFR calc Af Amer: >60 mL/min (ref >60)
GFR calc non Af Amer: 55 mL/min — ABNORMAL LOW (ref >60)
Glucose, Bld: 120 mg/dL — ABNORMAL HIGH (ref 70–99)
Potassium: 3.8 mmol/L (ref 3.5–5.1)
Sodium: 138 mmol/L (ref 135–145)

## 2018-11-09 LAB — GLUCOSE, CAPILLARY
Glucose-Capillary: 115 mg/dL — ABNORMAL HIGH (ref 70–99)
Glucose-Capillary: 154 mg/dL — ABNORMAL HIGH (ref 70–99)
Glucose-Capillary: 129 mg/dL — ABNORMAL HIGH (ref 70–99)
Glucose-Capillary: 104 mg/dL — ABNORMAL HIGH (ref 70–99)

## 2018-11-09 LAB — SARS CORONAVIRUS 2 (TAT 6-24 HRS): SARS Coronavirus 2: NEGATIVE

## 2018-11-09 LAB — MAGNESIUM: Magnesium: 2 mg/dL (ref 1.7–2.4)

## 2018-11-09 MED ORDER — ACETAMINOPHEN 325 MG PO TABS: 650.0000 mg | ORAL_TABLET | Freq: Four times a day (QID) | ORAL | 1 refills | Status: AC | PRN

## 2018-11-09 MED ORDER — BISACODYL 5 MG PO TBEC
5.0000 mg | DELAYED_RELEASE_TABLET | Freq: Once | ORAL | Status: AC
  Administered 2018-11-09: 5 mg via ORAL
  Filled 2018-11-09: qty 1

## 2018-11-09 MED ORDER — GADOBUTROL 1 MMOL/ML IV SOLN
7.5000 mL | Freq: Once | INTRAVENOUS | Status: AC | PRN
  Administered 2018-11-09: 7.5 mL via INTRAVENOUS

## 2018-11-09 MED ORDER — CYANOCOBALAMIN 1000 MCG/ML IJ SOLN
1000.0000 ug | Freq: Every day | INTRAMUSCULAR | Status: DC
  Administered 2018-11-09 – 2018-11-10 (×2): 1000 ug via SUBCUTANEOUS
  Filled 2018-11-09 (×3): qty 1

## 2018-11-09 MED ORDER — SENNOSIDES-DOCUSATE SODIUM 8.6-50 MG PO TABS: 2.0000 | ORAL_TABLET | Freq: Every evening | ORAL | Status: DC | PRN

## 2018-11-09 NOTE — Progress Notes (Signed)
Orthopedic Trauma Service Progress Note  Patient ID: Kristen Mason MRN: [id number] DOB/AGE: [birth year] 82 y.o.  Subjective:  Slowly improving  No specific complaints this am    ROS As above   Objective:   VITALS:   Vitals:   11/08/18 1501 11/08/18 2007 11/09/18 0510 11/09/18 0753  BP: (!) 160/55 (!) 152/57 (!) 156/57 (!) 172/64  Pulse: (!) 59 74 71 64  Resp: 16 20 16 17   Temp: 98.6 F (37 C) 98.4 F (36.9 C) (!) 97.4 F (36.3 C) 98.2 F (36.8 C)  TempSrc: Oral Oral Oral Oral  SpO2: 96% 92% 93% 94%  Weight:      Height:        Estimated body mass index is 25.79 kg/m as calculated from the following:   Height as of this encounter: 5\' 5"  (1.651 m).   Weight as of this encounter: 70.3 kg.   Intake/Output      10/13 0701 - 10/14 0700 10/14 0701 - 10/15 0700   P.O. 840    Total Intake(mL/kg) 840 (11.9)    Urine (mL/kg/hr) 800 (0.5)    Total Output 800    Net +40           LABS  Results for orders placed or performed during the hospital encounter of 10/31/18 (from the past 24 hour(s))  Glucose, capillary     Status: Abnormal   Collection Time: 11/08/18 11:56 AM  Result Value Ref Range   Glucose-Capillary 119 (H) 70 - 99 mg/dL  Glucose, capillary     Status: Abnormal   Collection Time: 11/08/18  1:44 PM  Result Value Ref Range   Glucose-Capillary 128 (H) 70 - 99 mg/dL  Glucose, capillary     Status: Abnormal   Collection Time: 11/08/18  4:54 PM  Result Value Ref Range   Glucose-Capillary 125 (H) 70 - 99 mg/dL  Glucose, capillary     Status: Abnormal   Collection Time: 11/08/18  8:53 PM  Result Value Ref Range   Glucose-Capillary 117 (H) 70 - 99 mg/dL     PHYSICAL EXAM:   Gen: resting comfortably, easily arousable  Lungs: unlabored Ext:       Right lower extremity              TED hose in place             Ext warm              + DP pulse             Swelling well  controlled             Dressings c/d/i             Motor and sensory functions grossly intact    Assessment/Plan: 8 Days Post-Op   Principal Problem:   Femur fracture, right (HCC) Active Problems:   Controlled diabetes mellitus (HCC)   Essential hypertension   Essential tremor   Acute on chronic kidney failure (HCC)   Macrocytic anemia   Abdominal pain   Anti-infectives (From admission, onward)   Start     Dose/Rate Route Frequency Ordered Stop   11/01/18 2330  ceFAZolin (ANCEF) IVPB 1 g/50 mL premix     1 g 100 mL/hr over 30 Minutes Intravenous Every 8 hours 11/01/18 2029  11/02/18 1624   11/01/18 1400  ceFAZolin (ANCEF) IVPB 2g/100 mL premix     2 g 200 mL/hr over 30 Minutes Intravenous  Once 11/01/18 1350 11/01/18 1533    .  POD/HD#: 78  84 y/o female s/p ground level fall with R supracondylar distal femur fracture    -fall   - closed R supracondylar distal femur fracture s/p retrograde IMN              TDWB R leg             Unrestricted ROM R knee and hip (at baseline she is restricted due to advanced endstage DJD)             PT/OT             Dressing changes as needed                          Ok to change dressing as needed                         Ok to clean with soap and water              Ice and elevate              Float heels off bed to prevent pressure sores and to keep knee in full extension when at rest so as to not exacerbate her contractures any further              No pillows under knee- place under ankle to keep knee in full extension when at rest              TED hose                 - Pain management:            scheduled tylenol              ultram    - ABL anemia/Hemodynamics                         Stable    - Medical issues              Per medical team                          Refractory nausea                                     appears improved                                     Monitor                                       Continue bowel regimen              Diabetes                          On SSI  R eye pain                          Erythromycin ointment    - DVT/PE prophylaxis:            eliquis due to PAF    - ID:              periop abx completed    - Metabolic Bone Disease:             profound vitamin d deficiency                          vitamin d3 2000 IU BID                         Also start vitamin d2 50000 weekly                          Vitamin c                          Calcitriol levels suggest long standing vitamin d deficiency and along with PTH levels suggest calcium deficiency                                        Will need DEXA in 4-8 weeks             Fracture indicative of osteoporosis                          Will likely need pharmacologic treatment of her osteoporosis    - Activity:             TDWB R leg    - FEN/GI prophylaxis/Foley/Lines:             carb mod diet, assistance with feeding due to tremors                - Impediments to fracture healing:             Osteoporosis             DM             Falls/balance issues             Psychotropic medication    - Dispo:             Therapy             will need SNF              Ortho issues improving             SW for SNF     Jari Pigg, PA-C [phone] (C) 11/09/2018, 9:14 AM  [facility] [place] [phone] (F)

## 2018-11-09 NOTE — Progress Notes (Signed)
PROGRESS NOTE        PATIENT DETAILS Name: Kristen Mason Age: 84 y.o. Sex: female Date of Birth: 12/11/1934 Admit Date: 10/31/2018 Admitting Physician Mckinley Jewel, MD PT:[id number], Denton Ar, MD  Brief Narrative: Patient is a 84 y.o. female with history of HTN, DM-2, essential tremor renal masses following a mechanical fall and right leg pain, she was found to have closed right supracondylar distal femur fracture.  Evaluated by orthopedics-underwent repair.  Hospital course complicated by development of A. fib, and nausea along with vomiting.  See below for further details.  Subjective: Doing well-continues to have intermittent nausea but no vomiting.  Assessment/Plan: Right supracondylar distal femur fracture: S/p retrograde femoral nailing on 10/6.  Recommendations from orthopedics are touchdown weightbearing to right lower extremity.  Continue supportive care-plans are for SNF hopefully tomorrow.  PAF: Continue metoprolol-after discussion with orthopedics and family by prior attending MD-started on Eliquis.  Nausea with vomiting: Vomiting has resolved-continues to have intermittent nausea.  Has not had BM since 10/10-1 dose of Dulcolax today.  Continue supportive care-and as needed antiemetics.  Acute blood loss anemia: Likely secondary to perioperative blood loss-hemoglobin stable.  Follow periodically.  AKI: Likely hemodynamically mediated-resolved.  Vitamin B12 deficiency: Continue supplementation-we will switch from oral to subcutaneous route-we will plan on daily dosing for 7 days, followed by once a week for 4 weeks-then maintain on once monthly dosing.  Moderate pulmonary hypertension with RV dysfunction.: Seen by echocardiogram-no evidence of PE on CTA chest.  Has been started on anticoagulation for PAF.  She is very frail and debilitated-likely not a candidate for advanced therapies.  Further work-up could be contemplated if patient's functional  status improves.  Plan outpatient follow-up with cardiology.  Essential tremor/?  Parkinson's disease: Continue primidone and Sinemet.  Depression/anxiety: Appears stable-continue with Celexa and clonazepam  DM-2: CBG stable-continue SSI  CBG (last 3)  Recent Labs    11/08/18 1654 11/08/18 2053 11/09/18 0935  GLUCAP 125* 117* 115*    Dyslipidemia: Continue with statin  Multiple renal masses-MRI on 10/14-right renal mass compatible with renal cell carcinoma: Stable for outpatient urology/oncology evaluation once her functional status improves.  Patient is extremely frail-debilitated-elderly-not sure she would be a good candidate for aggressive care.  Above plan was discussed with patient's daughter over the phone.  Nutrition Problem: Nutrition Problem: Increased nutrient needs Etiology: post-op healing Signs/Symptoms: estimated needs Interventions: Ensure Enlive (each supplement provides 350kcal and 20 grams of protein)  Diet: Diet Order            Diet Carb Modified Fluid consistency: Thin; Room service appropriate? Yes  Diet effective now               DVT Prophylaxis: Full dose anticoagulation with Eliquis  Code Status: Full code   Family Communication: Daughter over the phone  Disposition Plan: Remain inpatient- SNF on discharge on 10/15-spoke with social worker-COVID 19 test ordered on 10/14.  Antimicrobial agents: Anti-infectives (From admission, onward)   Start     Dose/Rate Route Frequency Ordered Stop   11/01/18 2330  ceFAZolin (ANCEF) IVPB 1 g/50 mL premix     1 g 100 mL/hr over 30 Minutes Intravenous Every 8 hours 11/01/18 2029 11/02/18 1624   11/01/18 1400  ceFAZolin (ANCEF) IVPB 2g/100 mL premix     2 g 200 mL/hr over 30 Minutes Intravenous  Once 11/01/18  1350 11/01/18 1533      Procedures: See above  CONSULTS:  orthopedic surgery  Time spent: 25 minutes-Greater than 50% of this time was spent in counseling, explanation of diagnosis,  planning of further management, and coordination of care.  MEDICATIONS: Scheduled Meds:  acetaminophen  500 mg Oral Q8H   amLODipine  5 mg Oral Daily   apixaban  5 mg Oral BID   aspirin EC  81 mg Oral Daily   calcium citrate  200 mg of elemental calcium Oral BID   carbidopa-levodopa  1 tablet Oral TID   cholecalciferol  2,000 Units Oral BID   citalopram  20 mg Oral Daily   clonazePAM  1 mg Oral QHS   docusate sodium  100 mg Oral BID   erythromycin   Right Eye QHS   feeding supplement (GLUCERNA SHAKE)  237 mL Oral TID WC   ferrous sulfate  325 mg Oral Q breakfast   hydroxypropyl methylcellulose / hypromellose  1 drop Both Eyes QID   influenza vaccine adjuvanted  0.5 mL Intramuscular Tomorrow-1000   insulin aspart  0-15 Units Subcutaneous TID WC   insulin aspart  0-5 Units Subcutaneous QHS   metoprolol tartrate  12.5 mg Oral BID   pantoprazole (PROTONIX) IV  40 mg Intravenous Q12H   polyethylene glycol  17 g Oral BID   primidone  250 mg Oral BID   simvastatin  10 mg Oral q1800   vitamin B-12  1,000 mcg Oral Daily   vitamin C  500 mg Oral Daily   Vitamin D (Ergocalciferol)  50,000 Units Oral Q7 days   Continuous Infusions:  sodium chloride 10 mL/hr at 11/02/18 1315   lactated ringers 100 mL/hr at 11/03/18 1119   PRN Meds:.acetaminophen **OR** acetaminophen, alum & mag hydroxide-simeth, bisacodyl, hydrocortisone, morphine injection, ondansetron, promethazine, senna-docusate, traMADol   PHYSICAL EXAM: Vital signs: Vitals:   11/08/18 1501 11/08/18 2007 11/09/18 0510 11/09/18 0753  BP: (!) 160/55 (!) 152/57 (!) 156/57 (!) 172/64  Pulse: (!) 59 74 71 64  Resp: 16 20 16 17   Temp: 98.6 F (37 C) 98.4 F (36.9 C) (!) 97.4 F (36.3 C) 98.2 F (36.8 C)  TempSrc: Oral Oral Oral Oral  SpO2: 96% 92% 93% 94%  Weight:      Height:       Filed Weights   10/31/18 1120 11/01/18 1412  Weight: 70.3 kg 70.3 kg   Body mass index is 25.79 kg/m.   Gen  Exam:Alert awake-not in any distress.  Looks very frail and deconditioned. HEENT:atraumatic, normocephalic Chest: B/L clear to auscultation anteriorly CVS:S1S2 regular Abdomen:soft non tender, non distended Extremities:no edema Neurology: Non focal-but with generalized weakness Skin: no rash  I have personally reviewed following labs and imaging studies  LABORATORY DATA: CBC: Recent Labs  Lab 11/03/18 0242 11/04/18 0154 11/05/18 0430 11/06/18 0306 11/07/18 0708  WBC 6.6 5.0 5.4 5.0 5.0  HGB 8.7* 8.0* 9.1* 8.6* 9.5*  HCT 27.5* 23.1* 28.4* 26.4* 29.7*  MCV 97.5 95.1 97.9 97.4 97.7  PLT 125* 161 172 174 [id number]    Basic Metabolic Panel: Recent Labs  Lab 11/04/18 0154 11/05/18 0430 11/06/18 0306 11/07/18 0708 11/09/18 0954  NA 136 138 139 140 138  K 4.0 3.8 3.7 3.6 3.8  CL 101 102 103 102 101  CO2 19* 24 25 25 25   GLUCOSE 145* 134* 118* 139* 120*  BUN 18 22 21 16 16   CREATININE 0.93 0.98 0.96 0.98 0.96  CALCIUM 7.9* 8.2* 8.0* 7.9*  8.2*  MG  --  2.0 1.9 1.8 2.0    GFR: Estimated Creatinine Clearance: 45.2 mL/min (by C-G formula based on SCr of 0.96 mg/dL).  Liver Function Tests: Recent Labs  Lab 11/05/18 0430 11/06/18 0306 11/07/18 0708  AST 64* 54* 30  ALT 8 7 10   ALKPHOS 77 82 90  BILITOT 0.9 1.4* 1.0  PROT 6.1* 5.6* 5.5*  ALBUMIN 2.9* 2.8* 2.8*   Recent Labs  Lab 11/06/18 0306  LIPASE 29   No results for input(s): AMMONIA in the last 168 hours.  Coagulation Profile: No results for input(s): INR, PROTIME in the last 168 hours.  Cardiac Enzymes: No results for input(s): CKTOTAL, CKMB, CKMBINDEX, TROPONINI in the last 168 hours.  BNP (last 3 results) No results for input(s): PROBNP in the last 8760 hours.  HbA1C: No results for input(s): HGBA1C in the last 72 hours.  CBG: Recent Labs  Lab 11/08/18 1156 11/08/18 1344 11/08/18 1654 11/08/18 2053 11/09/18 0935  GLUCAP 119* 128* 125* 117* 115*    Lipid Profile: No results for input(s):  CHOL, HDL, LDLCALC, TRIG, CHOLHDL, LDLDIRECT in the last 72 hours.  Thyroid Function Tests: Recent Labs    11/07/18 1332  TSH 0.978    Anemia Panel: No results for input(s): VITAMINB12, FOLATE, FERRITIN, TIBC, IRON, RETICCTPCT in the last 72 hours.  Urine analysis:    Component Value Date/Time   COLORURINE YELLOW 11/02/2018 [place] 11/02/2018 0558   LABSPEC 1.017 11/02/2018 0558   PHURINE 5.0 11/02/2018 0558   GLUCOSEU NEGATIVE 11/02/2018 0558   HGBUR SMALL (A) 11/02/2018 0558   BILIRUBINUR NEGATIVE 11/02/2018 0558   KETONESUR 80 (A) 11/02/2018 0558   PROTEINUR 30 (A) 11/02/2018 0558   NITRITE NEGATIVE 11/02/2018 0558   LEUKOCYTESUR MODERATE (A) 11/02/2018 0558    Sepsis Labs: Lactic Acid, Venous    Component Value Date/Time   LATICACIDVEN 1.5 11/04/2018 0847    MICROBIOLOGY: Recent Results (from the past 240 hour(s))  SARS CORONAVIRUS 2 (TAT 6-24 HRS) Nasopharyngeal Nasopharyngeal Swab     Status: None   Collection Time: 10/31/18 12:45 PM   Specimen: Nasopharyngeal Swab  Result Value Ref Range Status   SARS Coronavirus 2 NEGATIVE NEGATIVE Final    Comment: (NOTE) SARS-CoV-2 target nucleic acids are NOT DETECTED. The SARS-CoV-2 RNA is generally detectable in upper and lower respiratory specimens during the acute phase of infection. Negative results do not preclude SARS-CoV-2 infection, do not rule out co-infections with other pathogens, and should not be used as the sole basis for treatment or other patient management decisions. Negative results must be combined with clinical observations, patient history, and epidemiological information. The expected result is Negative. Fact Sheet for Patients: SugarRoll.be Fact Sheet for Healthcare Providers: https://www.woods-mathews.com/ This test is not yet approved or cleared by the [place] FDA and  has been authorized for detection and/or diagnosis of  SARS-CoV-2 by FDA under an Emergency Use Authorization (EUA). This EUA will remain  in effect (meaning this test can be used) for the duration of the COVID-19 declaration under Section 56 4(b)(1) of the Act, 21 U.S.C. section 360bbb-3(b)(1), unless the authorization is terminated or revoked sooner. Performed at [facility], [place] [address] [place], [place] [address]   Surgical pcr screen     Status: Abnormal   Collection Time: 10/31/18  5:50 PM   Specimen: Nasal Mucosa; Nasal Swab  Result Value Ref Range Status   MRSA, PCR POSITIVE (A) NEGATIVE Final  Comment: RESULT CALLED TO, READ BACK BY AND VERIFIED WITH: L COLLIE RN 1942 10/31/18 A BROWNING    Staphylococcus aureus POSITIVE (A) NEGATIVE Final    Comment: (NOTE) The Xpert SA Assay (FDA approved for NASAL specimens in patients 35 years of age and older), is one component of a comprehensive surveillance program. It is not intended to diagnose infection nor to guide or monitor treatment. Performed at [facility], [place] [address], [place], [place] [address]   SARS CORONAVIRUS 2 (TAT 6-24 HRS) Nasopharyngeal Nasopharyngeal Swab     Status: None   Collection Time: 11/05/18  6:19 AM   Specimen: Nasopharyngeal Swab  Result Value Ref Range Status   SARS Coronavirus 2 NEGATIVE NEGATIVE Final    Comment: (NOTE) SARS-CoV-2 target nucleic acids are NOT DETECTED. The SARS-CoV-2 RNA is generally detectable in upper and lower respiratory specimens during the acute phase of infection. Negative results do not preclude SARS-CoV-2 infection, do not rule out co-infections with other pathogens, and should not be used as the sole basis for treatment or other patient management decisions. Negative results must be combined with clinical observations, patient history, and epidemiological information. The expected result is Negative. Fact Sheet for Patients: SugarRoll.be Fact Sheet for Healthcare  Providers: https://www.woods-mathews.com/ This test is not yet approved or cleared by the [place] FDA and  has been authorized for detection and/or diagnosis of SARS-CoV-2 by FDA under an Emergency Use Authorization (EUA). This EUA will remain  in effect (meaning this test can be used) for the duration of the COVID-19 declaration under Section 56 4(b)(1) of the Act, 21 U.S.C. section 360bbb-3(b)(1), unless the authorization is terminated or revoked sooner. Performed at [facility], [place] [address], [place], [place] [address]     RADIOLOGY STUDIES/RESULTS: Dg Ankle Complete Right  Result Date: 10/31/2018 CLINICAL DATA:  Acute RIGHT ankle pain following fall. Initial encounter. EXAM: RIGHT ANKLE - COMPLETE 3+ VIEW COMPARISON:  None. FINDINGS: A tiny fracture along the LATERAL calcaneus is noted, age indeterminate. No other acute fracture, subluxation or dislocation identified. Mild LATERAL soft tissue swelling is present. IMPRESSION: Age indeterminate tiny fracture/avulsion off the LATERAL calcaneus. No other acute bony abnormality. Electronically Signed   By: Margarette Canada M.D.   On: 10/31/2018 12:57   Dg Abd 1 View  Result Date: 11/02/2018 CLINICAL DATA:  Abdominal pain. EXAM: ABDOMEN - 1 VIEW COMPARISON:  None. FINDINGS: Supine abdomen shows no gaseous bowel dilatation to suggest obstruction. Mild gaseous distention of the stomach observed. No unexpected abdominopelvic calcification. Degenerative changes noted in both hips and in the lumbar spine. IMPRESSION: Nonobstructive bowel gas pattern. Electronically Signed   By: Misty Stanley M.D.   On: 11/02/2018 19:28   Ct Head Wo Contrast  Result Date: 10/31/2018 CLINICAL DATA:  84 year old female with fall and head neck injury. Initial encounter. EXAM: CT HEAD WITHOUT CONTRAST CT CERVICAL SPINE WITHOUT CONTRAST TECHNIQUE: Multidetector CT imaging of the head and cervical spine was performed following the standard protocol  without intravenous contrast. Multiplanar CT image reconstructions of the cervical spine were also generated. COMPARISON:  06/07/2017 head CT FINDINGS: CT HEAD FINDINGS Brain: No evidence of acute infarction, hemorrhage, hydrocephalus, extra-axial collection or mass lesion/mass effect. Atrophy and chronic small-vessel white matter ischemic changes again noted. Vascular: Carotid atherosclerotic calcifications noted. Skull: Normal. Negative for fracture or focal lesion. Sinuses/Orbits: No acute finding. Other: None. CT CERVICAL SPINE FINDINGS Alignment: Normal. Skull base and vertebrae: No acute fracture. No primary bone lesion or  focal pathologic process. Soft tissues and spinal canal: No prevertebral fluid or swelling. No visible canal hematoma. Disc levels: Multilevel degenerative disc disease/spondylosis noted, moderate from C5-C7. Moderate multilevel facet arthropathy identified. These findings contribute to mild to moderate central spinal and bony foraminal narrowing at several levels. Upper chest: No acute abnormality. Markedly enlarged thyroid gland noted, RIGHT greater than LEFT. Other: None IMPRESSION: 1. No evidence of acute intracranial abnormality. Atrophy and chronic small-vessel white matter ischemic changes. 2. No static evidence of acute injury to the cervical spine. Degenerative changes as described. 3. Thyroid goiter. Electronically Signed   By: Margarette Canada M.D.   On: 10/31/2018 13:49   Ct Angio Chest Pe W Or Wo Contrast  Result Date: 11/08/2018 CLINICAL DATA:  84 year old female with history of new onset of atrial fibrillation. Right ventricular dysfunction. EXAM: CT ANGIOGRAPHY CHEST WITH CONTRAST TECHNIQUE: Multidetector CT imaging of the chest was performed using the standard protocol during bolus administration of intravenous contrast. Multiplanar CT image reconstructions and MIPs were obtained to evaluate the vascular anatomy. CONTRAST:  152mL OMNIPAQUE IOHEXOL 350 MG/ML SOLN COMPARISON:   Chest CTA 06/07/2017. FINDINGS: Cardiovascular: No filling defects within the pulmonary arterial tree to suggest underlying pulmonary embolism. Heart size is normal. There is no significant pericardial fluid, thickening or pericardial calcification. There is aortic atherosclerosis, as well as atherosclerosis of the great vessels of the mediastinum and the coronary arteries, including calcified atherosclerotic plaque in the left main, left anterior descending, left circumflex and right coronary arteries. Mediastinum/Nodes: Severe asymmetric enlargement of the thyroid gland (right lobe greater than left) which is heterogeneous in appearance with multiple partially calcified nodular areas, very similar to the prior examination, nonspecific but most compatible with a multinodular goiter. No pathologically enlarged mediastinal or hilar lymph nodes. Esophagus is unremarkable in appearance. No axillary lymphadenopathy. Lungs/Pleura: Calcified granuloma in the posterior aspect of the right lower lobe. In the periphery of the left upper lobe (axial image 58 of series 7) there is a 1.3 x 0.9 cm nodular density which appears very similar to prior study from 06/07/2017, presumably benign. No other definite suspicious appearing pulmonary nodules or masses are noted. No acute consolidative airspace disease. No pleural effusions. Upper Abdomen: Calcified gallstones in the gallbladder. Aortic atherosclerosis. Numerous colonic diverticulae in the region of the splenic flexure of the colon. Musculoskeletal: There are no aggressive appearing lytic or blastic lesions noted in the visualized portions of the skeleton. Review of the MIP images confirms the above findings. IMPRESSION: 1. No evidence of pulmonary embolism. 2. No acute findings are noted in the thorax to account for the patient's symptoms. 3. Aortic atherosclerosis, in addition to left main and 3 vessel coronary artery disease. 4. Stable 1.3 x 0.9 cm nodular density in the  periphery of the left upper lobe compared to 06/07/2017, presumably benign. 5. Cholelithiasis. 6. Colonic diverticulosis. 7. Asymmetric multinodular goiter redemonstrated. Aortic Atherosclerosis (ICD10-I70.0). Electronically Signed   By: Vinnie Langton M.D.   On: 11/08/2018 13:00   Ct Cervical Spine Wo Contrast  Result Date: 10/31/2018 CLINICAL DATA:  84 year old female with fall and head neck injury. Initial encounter. EXAM: CT HEAD WITHOUT CONTRAST CT CERVICAL SPINE WITHOUT CONTRAST TECHNIQUE: Multidetector CT imaging of the head and cervical spine was performed following the standard protocol without intravenous contrast. Multiplanar CT image reconstructions of the cervical spine were also generated. COMPARISON:  06/07/2017 head CT FINDINGS: CT HEAD FINDINGS Brain: No evidence of acute infarction, hemorrhage, hydrocephalus, extra-axial collection or mass lesion/mass effect. Atrophy  and chronic small-vessel white matter ischemic changes again noted. Vascular: Carotid atherosclerotic calcifications noted. Skull: Normal. Negative for fracture or focal lesion. Sinuses/Orbits: No acute finding. Other: None. CT CERVICAL SPINE FINDINGS Alignment: Normal. Skull base and vertebrae: No acute fracture. No primary bone lesion or focal pathologic process. Soft tissues and spinal canal: No prevertebral fluid or swelling. No visible canal hematoma. Disc levels: Multilevel degenerative disc disease/spondylosis noted, moderate from C5-C7. Moderate multilevel facet arthropathy identified. These findings contribute to mild to moderate central spinal and bony foraminal narrowing at several levels. Upper chest: No acute abnormality. Markedly enlarged thyroid gland noted, RIGHT greater than LEFT. Other: None IMPRESSION: 1. No evidence of acute intracranial abnormality. Atrophy and chronic small-vessel white matter ischemic changes. 2. No static evidence of acute injury to the cervical spine. Degenerative changes as described. 3.  Thyroid goiter. Electronically Signed   By: Margarette Canada M.D.   On: 10/31/2018 13:49   Ct Knee Right Wo Contrast  Result Date: 10/31/2018 CLINICAL DATA:  Right knee pain after falling. Evaluate distal femur fracture. EXAM: CT OF THE RIGHT KNEE WITHOUT CONTRAST TECHNIQUE: Multidetector CT imaging of the right knee was performed according to the standard protocol. Multiplanar CT image reconstructions were also generated. COMPARISON:  Radiographs same day. FINDINGS: Bones/Joint/Cartilage The bones are diffusely demineralized. There are advanced tricompartmental degenerative changes of the right knee, most advanced in the medial compartment. As seen on earlier radiographs, there is a comminuted and mildly displaced fracture of the distal femur. This fracture involves the distal metadiaphysis and demonstrates up to 9 mm of lateral displacement laterally. The fracture extends into the medial femoral epicondyle, but there is no involvement of the weight-bearing articular surface or intercondylar extension. The patella, proximal tibia and proximal fibula are intact. There is a small lipohemarthrosis, implying intra-articular extension of the distal femur fracture. Ligaments Suboptimally assessed by CT. The cruciate ligaments are grossly intact. Muscles and Tendons The extensor mechanism is intact.  No focal muscular abnormalities. Soft tissues There is a small Baker's cyst containing a loose body. Mild femoropopliteal atherosclerosis is noted. IMPRESSION: 1. Comminuted and mildly displaced intra-articular fracture of the distal femur with associated lipohemarthrosis. This fracture demonstrates no involvement of the articular surface of either femoral condyle or intercondylar extension, although does involve the medial femoral epicondyle. 2. Intact proximal tibia and fibula. 3. Advanced tricompartmental right knee osteoarthritis. Electronically Signed   By: Richardean Sale M.D.   On: 10/31/2018 17:56   Mr Abdomen W Wo  Contrast  Result Date: 11/09/2018 CLINICAL DATA:  Right kidney upper pole renal mass. Left mid kidney mass. EXAM: MRI ABDOMEN WITHOUT AND WITH CONTRAST TECHNIQUE: Multiplanar multisequence MR imaging of the abdomen was performed both before and after the administration of intravenous contrast. CONTRAST:  7.29mL GADAVIST GADOBUTROL 1 MMOL/ML IV SOLN COMPARISON:  Multiple exams, including 11/05/2018 CT scan FINDINGS: Despite efforts by the technologist and patient, motion artifact is present on today's exam and could not be eliminated. This reduces exam sensitivity and specificity. Lower chest: The peripheral left upper lobe lung nodule shown on recent chest CT of 11/08/2018 is visible on image 12/4. This is nonspecific by today's CT although is cold presumably benign on the prior chest CT. Mild cardiomegaly. Hepatobiliary: Multiple gallstones the gallbladder measuring up to 1.4 cm in diameter on image 18/4. No biliary dilatation or findings of choledocholithiasis. Suspected transient hepatic attenuation difference in the dome of the right hepatic lobe on image 13/14 measuring about 0.8 cm in diameter. There is  early arterial phase enhancement in this vicinity which is not matched on any other sequence. This could also represent a small area of focal nodular hyperplasia. Pancreas:  Unremarkable Spleen:  Unremarkable Adrenals/Urinary Tract:  Both adrenal glands appear normal. The right kidney upper pole exophytic mass measuring 2.9 by 2.2 cm on image 47/14 is heterogeneously enhancing compatible with malignancy. There is an additional T1 precontrast hyperintense lesion in the right mid to lower kidney. This measures 0.8 by 0.7 cm on image 65/12 and is difficult to characterize postcontrast due to small size, motion artifact, and misregistration on post-contrast images due to motion. An additional faint focus of accentuated T1 signal posteriorly in the right mid kidney on image 57/12 is too small to characterize,  about 0.70.5 cm. A 1.3 by 1.3 cm mildly T1 precontrast hyperintense lesion of the left kidney upper pole is shown on image 40/12. This has faint internal septation but no appreciable enhancement is considered Bosniak category 2. A 0.9 by 0.8 cm T1 hyperintense lesion of the left kidney upper pole on image 47/12 does not appear to enhance and is considered Bosniak category 2. A left mid kidney lesion measuring 1.1 by 1.0 cm has generally intermediate precontrast T1 signal and appears to have faint enhancing internal septa on subtraction images, versus low-grade enhancement. This lesion is considered Bosniak category IIF. Other small Bosniak category 1 and category 2 cysts are present bilaterally. Stomach/Bowel: Descending colon diverticulosis. Vascular/Lymphatic: Aortoiliac atherosclerotic vascular disease. No pathologic adenopathy. No appreciable tumor thrombus in the right renal vein. Other:  No supplemental non-categorized findings. Musculoskeletal: Lumbar spondylosis and degenerative disc disease. IMPRESSION: 1. Enhancing right kidney upper pole exophytic mass, 2.9 by 2.2 cm, most compatible with renal cell carcinoma. 2. The left mid kidney lesion measuring 1.1 by 1.0 cm is considered Bosniak category IIF (likely benign, but requiring surveillance). This has some internal lace-like enhancement probably reflecting internal septations. Follow up renal protocol MRI in 6 months time is recommended. This recommendation follows ACR consensus guidelines: Management of the Incidental Renal Mass on CT: A White Paper of the ACR Incidental Findings Committee. J Am Coll Radiol [phone]. 3. Other Bosniak category 1 and category 2 renal cysts are present. 4. Peripheral left upper lobe lung nodule as shown on recent chest CT. 5. Cholelithiasis. 6.  Aortic Atherosclerosis (ICD10-I70.0). 7. Transient hepatic attenuation difference versus small area of focal nodular hyperplasia in the dome of the right hepatic lobe. This is  considered benign. 8. Descending colon diverticulosis. 9. Lumbar spondylosis and degenerative disc disease. Electronically Signed   By: Van Clines M.D.   On: 11/09/2018 08:03   Ct Abdomen Pelvis W Contrast  Result Date: 11/05/2018 CLINICAL DATA:  Nausea vomiting. EXAM: CT ABDOMEN AND PELVIS WITH CONTRAST TECHNIQUE: Multidetector CT imaging of the abdomen and pelvis was performed using the standard protocol following bolus administration of intravenous contrast. CONTRAST:  161mL OMNIPAQUE IOHEXOL 300 MG/ML  SOLN COMPARISON:  Abdominal radiograph November 02, 2018, PET-CT May 05, 2017 FINDINGS: Lower chest: Right lower lobe calcified granuloma. Calcific atherosclerotic disease of the coronary arteries. Hepatobiliary: Normal appearance of the liver. Cholelithiasis without evidence of cholecystitis. Pancreas: Unremarkable. No pancreatic ductal dilatation or surrounding inflammatory changes. Spleen: Normal in size without focal abnormality. Adrenals/Urinary Tract: Normal adrenal glands. Exophytic right upper pole renal mass measures 2.9 cm. Indeterminate hypoattenuated circumscribed mass in the upper pole of the left kidney. No evidence of hydronephrosis or nephrolithiasis. Stomach/Bowel: Stomach is within normal limits. Appendix appears normal. No evidence of bowel  wall thickening, distention, or inflammatory changes. Vascular/Lymphatic: Aortic atherosclerosis. No enlarged abdominal or pelvic lymph nodes. Reproductive: Status post hysterectomy. No adnexal masses. Other: No abdominal wall hernia or abnormality. No abdominopelvic ascites. Musculoskeletal: Spondylosis of the spine. Moderate to advanced bilateral hip arthropathy. Partially visualized right femoral IM nail. IMPRESSION: 1. Exophytic right upper pole renal mass measures 2.9 cm, measured on prior PET-CT 2.3 cm. Indeterminate hypoattenuated circumscribed mass in the upper pole of the left kidney measures 1.3 cm. Renal cell carcinoma cannot be  excluded for either of these lesions. Further evaluation with abdominal MRI, renal protocol, may be considered if further imaging evaluation is desired. 2. Cholelithiasis without evidence of acute cholecystitis. 3. Calcific atherosclerotic disease of the coronary arteries and aorta. 4. Moderate to advanced bilateral hip arthropathy. 5. Partially visualized right femoral IM nail. Edema of the right upper thigh musculature, and overlying subcutaneous stranding, likely postsurgical. Electronically Signed   By: Fidela Salisbury M.D.   On: 11/05/2018 19:41   Dg Knee Complete 4 Views Right  Result Date: 10/31/2018 CLINICAL DATA:  Acute RIGHT knee pain following fall. Initial encounter. EXAM: RIGHT KNEE - COMPLETE 4+ VIEW COMPARISON:  None. FINDINGS: A mixed transverse/oblique fracture of the distal RIGHT femoral metadiaphysis is noted with 0.5 cm dorsal displacement. There is no definite intra-articular fracture extension identified. Severe tricompartmental degenerative changes are noted. There may be a tiny knee effusion present. No suspicious focal bony lesions are present. IMPRESSION: Distal RIGHT femoral fracture with 0.5 cm dorsal displacement. Severe tricompartmental degenerative changes. Electronically Signed   By: Margarette Canada M.D.   On: 10/31/2018 12:55   Dg C-arm 1-60 Min  Result Date: 11/01/2018 CLINICAL DATA:  Right femur surgery EXAM: RIGHT FEMUR 2 VIEWS; DG C-ARM 1-60 MIN COMPARISON:  10/31/2018 FINDINGS: Five low resolution intraoperative spot views of the right femur. Total fluoroscopy time was 1 minutes 28 seconds. The images demonstrate intramedullary rod and proximal and distal screw fixation of the right femur for comminuted distal femoral fracture. IMPRESSION: Intraoperative fluoroscopic assistance provided during surgical fixation of distal femur fracture Electronically Signed   By: Donavan Foil M.D.   On: 11/01/2018 19:14   Dg Hip Unilat W Or Wo Pelvis 2-3 Views Right  Result Date:  10/31/2018 CLINICAL DATA:  Status post fall EXAM: DG HIP (WITH OR WITHOUT PELVIS) 2-3V RIGHT COMPARISON:  None. FINDINGS: Bones: No fracture or dislocation. Normal bone mineralization. No periostitis. Joints: Normal alignment. No erosive changes. Severe osteoarthritis of bilateral hips with subchondral cystic changes, subchondral sclerosis and marginal osteophytes. Lower lumbar spine spondylosis. Soft tissue: No soft tissue abnormality. No radiopaque foreign body. No chondrocalcinosis. IMPRESSION: Severe osteoarthritis of bilateral hips. Electronically Signed   By: Kathreen Devoid   On: 10/31/2018 12:55   Dg Femur, Min 2 Views Right  Result Date: 11/01/2018 CLINICAL DATA:  Right femur surgery EXAM: RIGHT FEMUR 2 VIEWS; DG C-ARM 1-60 MIN COMPARISON:  10/31/2018 FINDINGS: Five low resolution intraoperative spot views of the right femur. Total fluoroscopy time was 1 minutes 28 seconds. The images demonstrate intramedullary rod and proximal and distal screw fixation of the right femur for comminuted distal femoral fracture. IMPRESSION: Intraoperative fluoroscopic assistance provided during surgical fixation of distal femur fracture Electronically Signed   By: Donavan Foil M.D.   On: 11/01/2018 19:14   Dg Femur Port, Min 2 Views Right  Result Date: 11/01/2018 CLINICAL DATA:  Postop EXAM: RIGHT FEMUR PORTABLE 2 VIEW COMPARISON:  11/01/2018, 10/31/2018 FINDINGS: Right femoral head projects in joint. Interval  intramedullary rod and screw fixation of the right femur for comminuted distal femoral fracture with decreased anterior to posterior displacement of fracture segments. Advanced tricompartment arthritis of the knee. Lipohemarthrosis appears less apparent. IMPRESSION: Interval intramedullary rod and screw fixation of the right femur for distal femoral fracture with expected postsurgical changes. Electronically Signed   By: Donavan Foil M.D.   On: 11/01/2018 19:20     LOS: 9 days   Oren Binet, MD  [facility]  If 7PM-7AM, please contact night-coverage  Please page via [email or web address]  Go to [email or web address] and use [facility]'s universal password to access. If you do not have the password, please contact the hospital operator.  Locate the [facility] provider you are looking for under [facility] and page to a number that you can be directly reached. If you still have difficulty reaching the provider, please page the [facility] (Director on Call) for the Hospitalists listed on amion for assistance.  11/09/2018, 12:09 PM

## 2018-11-09 NOTE — Progress Notes (Signed)
Physical Therapy Treatment Patient Details Name: Kristen Mason MRN: [id number] DOB: March 10, 1934 Today's Date: 11/09/2018    History of Present Illness 84 yo admitted after fall at home with right femur fx s/p IM nail and right calcaneal fx non-op. PMhx: HTN, DM, CKD, neuropathy, bil knee flexion contracture due to endstage OA    PT Comments    Pt admitted with above diagnosis. Pt was able to stand and pivot with mod assist of 2 and max cues.  Pt still cannot progress ambulation as she is TDWB right LE and it is too difficult to do more than transfer and maintain.  Pt performed exercises as well.   Pt currently with functional limitations due to the deficits listed below (see PT Problem List). Pt will benefit from skilled PT to increase their independence and safety with mobility to allow discharge to the venue listed below.     Follow Up Recommendations  SNF     Equipment Recommendations  None recommended by PT    Recommendations for Other Services       Precautions / Restrictions Precautions Precautions: Fall Restrictions Weight Bearing Restrictions: Yes RLE Weight Bearing: Touchdown weight bearing    Mobility  Bed Mobility Overal bed mobility: Needs Assistance Bed Mobility: Supine to Sit     Supine to sit: Mod assist;HOB elevated;+2 for physical assistance     General bed mobility comments: Pt able to progress LEs off EOB and pivot hips. Assist given to elevate trunk and scoot forward to EOB.  Transfers Overall transfer level: Needs assistance Equipment used: Rolling walker (2 wheeled) Transfers: Sit to/from [employer] Sit to Stand: +2 physical assistance;Mod assist;From elevated surface Stand pivot transfers: Mod assist;+2 physical assistance       General transfer comment: Pt stood from EOB with mod A +2. Took incr time but pt was able to come fully upright. Was able to take  pivotal steps  to recliner chair with max cues and assist to move RW.   Pt able to get squared up to chair but still needed assist to control descent which daughter said was premorbid.  Pts R LE able to maintain TDWB with cues.   Ambulation/Gait             General Gait Details: unable   Stairs             Wheelchair Mobility    Modified Rankin (Stroke Patients Only)       Balance Overall balance assessment: Needs assistance Sitting-balance support: No upper extremity supported Sitting balance-Leahy Scale: Fair     Standing balance support: Bilateral upper extremity supported Standing balance-Leahy Scale: Poor Standing balance comment: bil UE support and external support in standing with use of RW                            Cognition Arousal/Alertness: Awake/alert Behavior During Therapy: WFL for tasks assessed/performed Overall Cognitive Status: Within Functional Limits for tasks assessed Area of Impairment: Safety/judgement;Problem solving                         Safety/Judgement: Decreased awareness of deficits;Decreased awareness of safety   Problem Solving: Slow processing        Exercises General Exercises - Lower Extremity Ankle Circles/Pumps: AROM;Both;10 reps;Supine Quad Sets: AROM;Both;10 reps;Supine Long Arc Quad: AROM;Both;10 reps;Seated Hip Flexion/Marching: AROM;Both;10 reps;Seated Other Exercises Other Exercises: issued theraband for pt to exercise bil UEs  with this.     General Comments General comments (skin integrity, edema, etc.): Pt still nauseous and nurse made aware.      Pertinent Vitals/Pain Pain Assessment: Faces Faces Pain Scale: Hurts even more Pain Location: RLE with movement Pain Descriptors / Indicators: Aching;Guarding Pain Intervention(s): Limited activity within patient's tolerance;Monitored during session;Repositioned    Home Living                      Prior Function            PT Goals (current goals can now be found in the care plan section)  Acute Rehab PT Goals Patient Stated Goal: "I want to feel better" Progress towards PT goals: Progressing toward goals    Frequency    Min 3X/week      PT Plan Current plan remains appropriate    Co-evaluation              AM-PAC PT "6 Clicks" Mobility   Outcome Measure  Help needed turning from your back to your side while in a flat bed without using bedrails?: A Lot Help needed moving from lying on your back to sitting on the side of a flat bed without using bedrails?: A Lot Help needed moving to and from a bed to a chair (including a wheelchair)?: A Lot Help needed standing up from a chair using your arms (e.g., wheelchair or bedside chair)?: Total Help needed to walk in hospital room?: Total Help needed climbing 3-5 steps with a railing? : Total 6 Click Score: 9    End of Session Equipment Utilized During Treatment: Gait belt Activity Tolerance: Patient limited by fatigue Patient left: in chair;with call bell/phone within reach;with chair alarm set;with family/visitor present Nurse Communication: Mobility status PT Visit Diagnosis: Other abnormalities of gait and mobility (R26.89);Muscle weakness (generalized) (M62.81);History of falling (Z91.81)     Time: 1032-1100 PT Time Calculation (min) (ACUTE ONLY): 28 min  Charges:  $Therapeutic Exercise: 8-22 mins $Therapeutic Activity: 8-22 mins                     [place] Pager:  [phone]  Office:  [place] 11/09/2018, 1:25 PM

## 2018-11-09 NOTE — Plan of Care (Signed)
  Problem: Pain Managment: Goal: General experience of comfort will improve Outcome: Progressing   Problem: Elimination: Goal: Will not experience complications related to bowel motility Outcome: Progressing   Problem: Pain Managment: Goal: General experience of comfort will improve Outcome: Progressing   Problem: Skin Integrity: Goal: Risk for impaired skin integrity will decrease Outcome: Progressing

## 2018-11-10 DIAGNOSIS — R300 Dysuria: Secondary | ICD-10-CM | POA: Diagnosis not present

## 2018-11-10 DIAGNOSIS — G20 Parkinson's disease: Secondary | ICD-10-CM | POA: Diagnosis not present

## 2018-11-10 DIAGNOSIS — K649 Unspecified hemorrhoids: Secondary | ICD-10-CM | POA: Diagnosis not present

## 2018-11-10 DIAGNOSIS — D539 Nutritional anemia, unspecified: Secondary | ICD-10-CM | POA: Diagnosis not present

## 2018-11-10 DIAGNOSIS — Z741 Need for assistance with personal care: Secondary | ICD-10-CM | POA: Diagnosis present

## 2018-11-10 DIAGNOSIS — R2689 Other abnormalities of gait and mobility: Secondary | ICD-10-CM | POA: Diagnosis present

## 2018-11-10 DIAGNOSIS — E1140 Type 2 diabetes mellitus with diabetic neuropathy, unspecified: Secondary | ICD-10-CM | POA: Diagnosis present

## 2018-11-10 DIAGNOSIS — E1142 Type 2 diabetes mellitus with diabetic polyneuropathy: Secondary | ICD-10-CM | POA: Diagnosis not present

## 2018-11-10 DIAGNOSIS — Z743 Need for continuous supervision: Secondary | ICD-10-CM | POA: Diagnosis not present

## 2018-11-10 DIAGNOSIS — S72451D Displaced supracondylar fracture without intracondylar extension of lower end of right femur, subsequent encounter for closed fracture with routine healing: Secondary | ICD-10-CM | POA: Diagnosis not present

## 2018-11-10 DIAGNOSIS — I10 Essential (primary) hypertension: Secondary | ICD-10-CM | POA: Diagnosis not present

## 2018-11-10 DIAGNOSIS — B964 Proteus (mirabilis) (morganii) as the cause of diseases classified elsewhere: Secondary | ICD-10-CM | POA: Diagnosis not present

## 2018-11-10 DIAGNOSIS — S72441D Displaced fracture of lower epiphysis (separation) of right femur, subsequent encounter for closed fracture with routine healing: Secondary | ICD-10-CM | POA: Diagnosis not present

## 2018-11-10 DIAGNOSIS — D519 Vitamin B12 deficiency anemia, unspecified: Secondary | ICD-10-CM | POA: Diagnosis present

## 2018-11-10 DIAGNOSIS — Z23 Encounter for immunization: Secondary | ICD-10-CM | POA: Diagnosis not present

## 2018-11-10 DIAGNOSIS — G250 Essential tremor: Secondary | ICD-10-CM | POA: Diagnosis not present

## 2018-11-10 DIAGNOSIS — B379 Candidiasis, unspecified: Secondary | ICD-10-CM | POA: Diagnosis not present

## 2018-11-10 DIAGNOSIS — D500 Iron deficiency anemia secondary to blood loss (chronic): Secondary | ICD-10-CM | POA: Diagnosis not present

## 2018-11-10 DIAGNOSIS — Z87440 Personal history of urinary (tract) infections: Secondary | ICD-10-CM | POA: Diagnosis not present

## 2018-11-10 DIAGNOSIS — F411 Generalized anxiety disorder: Secondary | ICD-10-CM | POA: Diagnosis not present

## 2018-11-10 DIAGNOSIS — I959 Hypotension, unspecified: Secondary | ICD-10-CM | POA: Diagnosis not present

## 2018-11-10 DIAGNOSIS — S79929A Unspecified injury of unspecified thigh, initial encounter: Secondary | ICD-10-CM | POA: Diagnosis not present

## 2018-11-10 DIAGNOSIS — R109 Unspecified abdominal pain: Secondary | ICD-10-CM | POA: Diagnosis not present

## 2018-11-10 DIAGNOSIS — R279 Unspecified lack of coordination: Secondary | ICD-10-CM | POA: Diagnosis not present

## 2018-11-10 DIAGNOSIS — R5381 Other malaise: Secondary | ICD-10-CM | POA: Diagnosis not present

## 2018-11-10 DIAGNOSIS — R262 Difficulty in walking, not elsewhere classified: Secondary | ICD-10-CM | POA: Diagnosis present

## 2018-11-10 DIAGNOSIS — B9620 Unspecified Escherichia coli [E. coli] as the cause of diseases classified elsewhere: Secondary | ICD-10-CM | POA: Diagnosis not present

## 2018-11-10 DIAGNOSIS — R52 Pain, unspecified: Secondary | ICD-10-CM | POA: Diagnosis not present

## 2018-11-10 DIAGNOSIS — M1711 Unilateral primary osteoarthritis, right knee: Secondary | ICD-10-CM | POA: Diagnosis present

## 2018-11-10 DIAGNOSIS — E118 Type 2 diabetes mellitus with unspecified complications: Secondary | ICD-10-CM | POA: Diagnosis not present

## 2018-11-10 DIAGNOSIS — I480 Paroxysmal atrial fibrillation: Secondary | ICD-10-CM | POA: Diagnosis not present

## 2018-11-10 DIAGNOSIS — S7291XA Unspecified fracture of right femur, initial encounter for closed fracture: Secondary | ICD-10-CM | POA: Diagnosis not present

## 2018-11-10 DIAGNOSIS — W010XXA Fall on same level from slipping, tripping and stumbling without subsequent striking against object, initial encounter: Secondary | ICD-10-CM | POA: Diagnosis not present

## 2018-11-10 DIAGNOSIS — U071 COVID-19: Secondary | ICD-10-CM | POA: Diagnosis not present

## 2018-11-10 DIAGNOSIS — M6281 Muscle weakness (generalized): Secondary | ICD-10-CM | POA: Diagnosis present

## 2018-11-10 DIAGNOSIS — N390 Urinary tract infection, site not specified: Secondary | ICD-10-CM | POA: Diagnosis not present

## 2018-11-10 DIAGNOSIS — F329 Major depressive disorder, single episode, unspecified: Secondary | ICD-10-CM | POA: Diagnosis not present

## 2018-11-10 LAB — GLUCOSE, CAPILLARY
Glucose-Capillary: 124 mg/dL — ABNORMAL HIGH (ref 70–99)
Glucose-Capillary: 192 mg/dL — ABNORMAL HIGH (ref 70–99)

## 2018-11-10 MED ORDER — HYPROMELLOSE (GONIOSCOPIC) 2.5 % OP SOLN: 1.0000 [drp] | Freq: Four times a day (QID) | OPHTHALMIC | 12 refills | Status: DC

## 2018-11-10 MED ORDER — ONDANSETRON 4 MG PO TBDP: 4.0000 mg | ORAL_TABLET | Freq: Three times a day (TID) | ORAL | 0 refills | Status: DC | PRN

## 2018-11-10 MED ORDER — ASPIRIN 81 MG PO TBEC: 81.0000 mg | DELAYED_RELEASE_TABLET | Freq: Every day | ORAL | 0 refills | Status: AC

## 2018-11-10 MED ORDER — APIXABAN 5 MG PO TABS: 5.0000 mg | ORAL_TABLET | Freq: Two times a day (BID) | ORAL | 0 refills | Status: DC

## 2018-11-10 MED ORDER — METOPROLOL TARTRATE 25 MG PO TABS: 12.5000 mg | ORAL_TABLET | Freq: Two times a day (BID) | ORAL | 0 refills | Status: DC

## 2018-11-10 MED ORDER — BISACODYL 10 MG RE SUPP: 10.0000 mg | Freq: Every day | RECTAL | 0 refills | Status: DC | PRN

## 2018-11-10 MED ORDER — ERYTHROMYCIN 5 MG/GM OP OINT: TOPICAL_OINTMENT | Freq: Every day | OPHTHALMIC | 0 refills | Status: DC

## 2018-11-10 MED ORDER — DOCUSATE SODIUM 100 MG PO CAPS: 100.0000 mg | ORAL_CAPSULE | Freq: Two times a day (BID) | ORAL | 0 refills | Status: DC

## 2018-11-10 MED ORDER — FERROUS SULFATE 325 (65 FE) MG PO TABS: 325.0000 mg | ORAL_TABLET | Freq: Every day | ORAL | 3 refills | Status: DC

## 2018-11-10 MED ORDER — SENNOSIDES-DOCUSATE SODIUM 8.6-50 MG PO TABS: 2.0000 | ORAL_TABLET | Freq: Every evening | ORAL | 0 refills | Status: AC | PRN

## 2018-11-10 MED ORDER — TRAMADOL HCL 50 MG PO TABS: 50.0000 mg | ORAL_TABLET | Freq: Four times a day (QID) | ORAL | 0 refills | Status: AC | PRN

## 2018-11-10 NOTE — TOC Transition Note (Signed)
Transition of Care [facility]) - CM/SW Discharge Note   Patient Details  Name: Kristen Mason MRN: [id number] Date of Birth: 03-25-1934  [facility] CM/SW Contact:  Midge Minium RN, BSN, NCM-BC, ACM-RN [phone] Phone Number: 11/10/2018, 10:15 AM   Clinical Narrative:    Patient is medically stable to transition to [facility] for rehab. CM updated the patients daughter, [name] and Lenward Chancellor (Admissions Coordinator). PTAR will be arranged for noon.  Please call report to: [phone]; room 117   Final next level of care: Skilled Nursing Facility Barriers to Discharge: No Barriers Identified   Patient Goals and CMS Choice Patient states their goals for this hospitalization and ongoing recovery are:: "to get stronger" CMS Medicare.gov Compare Post Acute Care list provided to:: Patient Represenative (must comment)(Leslie Duwayne Heck) Choice offered to / list presented to : Adult Children([place])  Discharge Placement              Patient chooses bed at: Other - please specify in the comment section below:([facility]) Patient to be transferred to facility by: [facility] Name of family member notified: [name] Patient and family notified of of transfer: 11/10/18  Discharge Plan and Services   Discharge Planning Services: CM Consult Post Acute Care Choice: [facility]          DME Arranged: N/A DME Agency: NA       HH Arranged: NA HH Agency: NA        Social Determinants of Health (SDOH) Interventions     Readmission Risk Interventions Readmission Risk Prevention Plan 11/03/2018  Transportation Screening Complete  PCP or Specialist Appt within 3-5 Days Complete  HRI or Home Care Consult Complete  Social Work Consult for [place] Planning/Counseling Complete  Palliative Care Screening Not Applicable  Medication Review Press photographer) Complete  Some recent data might be hidden

## 2018-11-10 NOTE — Plan of Care (Signed)
  Problem: Activity: Goal: Risk for activity intolerance will decrease Outcome: Progressing   Problem: Elimination: Goal: Will not experience complications related to bowel motility Outcome: Progressing   Problem: Skin Integrity: Goal: Risk for impaired skin integrity will decrease Outcome: Progressing   Problem: Safety: Goal: Ability to remain free from injury will improve Outcome: Progressing

## 2018-11-10 NOTE — Care Management Important Message (Signed)
Important Message  Patient Details  Name: Kristen Mason MRN: [id number] Date of Birth: [birth year]   Medicare Important Message Given:  Yes     Memory Argue 11/10/2018, 11:38 AM

## 2018-11-10 NOTE — Progress Notes (Signed)
Discharge packet given to patient and daughter present during visit.  Both acknowledged understanding and denied any further questions. Attempted to call report to [facility], tried multiple times, no answer.

## 2018-11-10 NOTE — Discharge Summary (Signed)
Physician Discharge Summary  Kristen Mason [id number] DOB: 09/26/1934 DOA: 10/31/2018  PCP: Wenda Low, MD  Admit date: 10/31/2018 Discharge date: 11/10/2018  Admitted From: Inpatient Disposition: SNF  Recommendations for Outpatient Follow-up:  1. Follow up with PCP in 1-2 weeks 2    F/up with ortho for post op eval  Home Health:No Equipment/Devices:per PT  Discharge Condition:Stable CODE STATUS:Full code Diet recommendation: Diabetic diet  Brief/Interim Summary: Patient is a 84 y.o. female with history of HTN, DM-2, essential tremor renal masses following a mechanical fall and right leg pain, she was found to have closed right supracondylar distal femur fracture.  Evaluated by orthopedics-underwent repair.  Hospital course complicated by development of A. fib, and nausea along with vomiting.  See below for further details.  Hospital course: Right supracondylar distal femur fracture: S/p retrograde femoral nailing on 10/6.  Recommendations from orthopedics are touchdown weightbearing to right lower extremity.  Continue supportive care-plans are for SNF today  PAF: Continue metoprolol-after discussion with orthopedics and family by prior attending MD-started on Eliquis which we will continue  Nausea with vomiting: Vomiting has resolved-continues to have intermittent nausea.   Continue supportive care-and as needed antiemetics, continued on med rec  Acute blood loss anemia: Likely secondary to perioperative blood loss-hemoglobin stable. Has remained stable, follow periodically.  AKI: Likely hemodynamically mediated-resolved, BUN/Cr 16/0.98  Vitamin B12 deficiency: continue at SNF, Monitor for response  Moderate pulmonary hypertension with RV dysfunction.: Seen by echocardiogram-no evidence of PE on CTA chest.  Has been started on anticoagulation for PAF.  She is very frail and debilitated-likely not a candidate for advanced therapies.  Further work-up could be  contemplated if patient's functional status improves. Recommended outpatient follow-up with cardiology.  Essential tremor/?  Parkinson's disease: Continue primidone and Sinemet.  Depression/anxiety: Appears stable-continue with Celexa and clonazepam  DM-2: CBG stable-continue SSI  Dyslipidemia: Continue with statin  Multiple renal masses-MRI on 10/14-right renal mass compatible with renal cell carcinoma: Stable for outpatient urology/oncology evaluation once her functional status improves.  Patient is extremely frail-debilitated-elderly-not sure she would be a good candidate for aggressive care.  Above plan was discussed with patient's daughter over the phone.    Discharge Diagnoses:  Principal Problem:   Femur fracture, right ([place]) Active Problems:   Controlled diabetes mellitus ([place])   Essential hypertension   Essential tremor   Acute on chronic kidney failure ([facility])   Macrocytic anemia   Abdominal pain    Discharge Instructions  Discharge Instructions    Call MD for:  difficulty breathing, headache or visual disturbances   Complete by: As directed    Call MD for:  extreme fatigue   Complete by: As directed    Call MD for:  hives   Complete by: As directed    Call MD for:  persistant dizziness or light-headedness   Complete by: As directed    Call MD for:  persistant nausea and vomiting   Complete by: As directed    Call MD for:  redness, tenderness, or signs of infection (pain, swelling, redness, odor or green/yellow discharge around incision site)   Complete by: As directed    Call MD for:  severe uncontrolled pain   Complete by: As directed    Call MD for:  temperature >100.4   Complete by: As directed    Diet Carb Modified   Complete by: As directed    Discharge wound care:   Complete by: As directed    Per orthopedics   Increase activity  slowly   Complete by: As directed      Allergies as of 11/10/2018      Reactions   Statins Other (See Comments)       Medication List    STOP taking these medications   aspirin 81 MG tablet Replaced by: aspirin 81 MG EC tablet   lisinopril 2.5 MG tablet Commonly known as: ZESTRIL     TAKE these medications   acetaminophen 325 MG tablet Commonly known as: TYLENOL Take 2 tablets (650 mg total) by mouth every 6 (six) hours as needed for mild pain (or Fever >/= 101).   amLODipine 5 MG tablet Commonly known as: NORVASC Take 5 mg by mouth daily.   apixaban 5 MG Tabs tablet Commonly known as: ELIQUIS Take 1 tablet (5 mg total) by mouth 2 (two) times daily.   ascorbic acid 500 MG tablet Commonly known as: VITAMIN C Take 1 tablet (500 mg total) by mouth daily.   aspirin 81 MG EC tablet Take 1 tablet (81 mg total) by mouth daily. Start taking on: November 11, 2018 Replaces: aspirin 81 MG tablet   bisacodyl 10 MG suppository Commonly known as: DULCOLAX Place 1 suppository (10 mg total) rectally daily as needed for moderate constipation.   calcium citrate 950 MG tablet Commonly known as: CALCITRATE - dosed in mg elemental calcium Take 1 tablet (200 mg of elemental calcium total) by mouth 2 (two) times daily.   carbidopa-levodopa 25-100 MG tablet Commonly known as: SINEMET IR Take 1 tablet by mouth 3 (three) times daily.   cetirizine 10 MG tablet Commonly known as: ZYRTEC Take 10 mg by mouth daily.   citalopram 40 MG tablet Commonly known as: CELEXA Take 40 mg by mouth daily.   clonazePAM 1 MG tablet Commonly known as: KLONOPIN Take 1 tablet (1 mg total) by mouth at bedtime.   dicyclomine 10 MG capsule Commonly known as: BENTYL Take 10 mg by mouth 4 (four) times daily - after meals and at bedtime.   docusate sodium 100 MG capsule Commonly known as: COLACE Take 1 capsule (100 mg total) by mouth 2 (two) times daily.   erythromycin ophthalmic ointment Place into the right eye at bedtime.   ferrous sulfate 325 (65 FE) MG tablet Take 1 tablet (325 mg total) by mouth daily  with breakfast. Start taking on: November 11, 2018   Fish Oil 1000 MG Cpdr Take 1,000 mg by mouth daily.   gabapentin 800 MG tablet Commonly known as: NEURONTIN Take 800 mg by mouth 3 (three) times daily.   glimepiride 1 MG tablet Commonly known as: AMARYL Take 1 mg by mouth daily.   hydroxypropyl methylcellulose / hypromellose 2.5 % ophthalmic solution Commonly known as: ISOPTO TEARS / GONIOVISC Place 1 drop into both eyes 4 (four) times daily.   hydrOXYzine 25 MG tablet Commonly known as: ATARAX/VISTARIL Take 25 mg by mouth at bedtime.   metoprolol tartrate 25 MG tablet Commonly known as: LOPRESSOR Take 0.5 tablets (12.5 mg total) by mouth 2 (two) times daily.   ondansetron 4 MG disintegrating tablet Commonly known as: ZOFRAN-ODT Take 1 tablet (4 mg total) by mouth every 8 (eight) hours as needed for nausea or vomiting.   pantoprazole 40 MG tablet Commonly known as: PROTONIX Take 40 mg by mouth daily.   primidone 250 MG tablet Commonly known as: MYSOLINE Take 250 mg by mouth 2 (two) times daily.   senna-docusate 8.6-50 MG tablet Commonly known as: Senokot-S Take 2 tablets by mouth at  bedtime as needed for up to 5 days for mild constipation.   simvastatin 10 MG tablet Commonly known as: ZOCOR Take 10 mg by mouth daily.   traMADol 50 MG tablet Commonly known as: ULTRAM Take 1 tablet (50 mg total) by mouth every 6 (six) hours as needed. What changed: reasons to take this   Vitamin D (Ergocalciferol) 1.25 MG (50000 UT) Caps capsule Commonly known as: DRISDOL Take 1 capsule (50,000 Units total) by mouth every 7 (seven) days. Start taking on: November 11, 2018   Vitamin D3 25 MCG tablet Commonly known as: Vitamin D Take 2 tablets (2,000 Units total) by mouth 2 (two) times daily.            Discharge Care Instructions  (From admission, onward)         Start     Ordered   11/10/18 0000  Discharge wound care:    Comments: Per orthopedics   11/10/18 1005           Contact information for follow-up providers    Altamese Troup, MD. Schedule an appointment as soon as possible for a visit in 1 week(s).   Specialty: Orthopedic Surgery Contact information: [place] [address] [phone]            Contact information for after-discharge care    Destination    [facility] Follow up.   Service: Skilled Nursing Why: rehab Contact information: [place] [address] [phone]                 Allergies  Allergen Reactions  . Statins Other (See Comments)    Consultations:  orthopedics   Procedures/Studies: Dg Ankle Complete Right  Result Date: 10/31/2018 CLINICAL DATA:  Acute RIGHT ankle pain following fall. Initial encounter. EXAM: RIGHT ANKLE - COMPLETE 3+ VIEW COMPARISON:  None. FINDINGS: A tiny fracture along the LATERAL calcaneus is noted, age indeterminate. No other acute fracture, subluxation or dislocation identified. Mild LATERAL soft tissue swelling is present. IMPRESSION: Age indeterminate tiny fracture/avulsion off the LATERAL calcaneus. No other acute bony abnormality. Electronically Signed   By: Margarette Canada M.D.   On: 10/31/2018 12:57   Dg Abd 1 View  Result Date: 11/02/2018 CLINICAL DATA:  Abdominal pain. EXAM: ABDOMEN - 1 VIEW COMPARISON:  None. FINDINGS: Supine abdomen shows no gaseous bowel dilatation to suggest obstruction. Mild gaseous distention of the stomach observed. No unexpected abdominopelvic calcification. Degenerative changes noted in both hips and in the lumbar spine. IMPRESSION: Nonobstructive bowel gas pattern. Electronically Signed   By: Misty Stanley M.D.   On: 11/02/2018 19:28   Ct Head Wo Contrast  Result Date: 10/31/2018 CLINICAL DATA:  84 year old female with fall and head neck injury. Initial encounter. EXAM: CT HEAD WITHOUT CONTRAST CT CERVICAL SPINE WITHOUT CONTRAST TECHNIQUE: Multidetector CT imaging of the  head and cervical spine was performed following the standard protocol without intravenous contrast. Multiplanar CT image reconstructions of the cervical spine were also generated. COMPARISON:  06/07/2017 head CT FINDINGS: CT HEAD FINDINGS Brain: No evidence of acute infarction, hemorrhage, hydrocephalus, extra-axial collection or mass lesion/mass effect. Atrophy and chronic small-vessel white matter ischemic changes again noted. Vascular: Carotid atherosclerotic calcifications noted. Skull: Normal. Negative for fracture or focal lesion. Sinuses/Orbits: No acute finding. Other: None. CT CERVICAL SPINE FINDINGS Alignment: Normal. Skull base and vertebrae: No acute fracture. No primary bone lesion or focal pathologic process. Soft tissues and spinal canal: No prevertebral fluid or swelling.  No visible canal hematoma. Disc levels: Multilevel degenerative disc disease/spondylosis noted, moderate from C5-C7. Moderate multilevel facet arthropathy identified. These findings contribute to mild to moderate central spinal and bony foraminal narrowing at several levels. Upper chest: No acute abnormality. Markedly enlarged thyroid gland noted, RIGHT greater than LEFT. Other: None IMPRESSION: 1. No evidence of acute intracranial abnormality. Atrophy and chronic small-vessel white matter ischemic changes. 2. No static evidence of acute injury to the cervical spine. Degenerative changes as described. 3. Thyroid goiter. Electronically Signed   By: Margarette Canada M.D.   On: 10/31/2018 13:49   Ct Angio Chest Pe W Or Wo Contrast  Result Date: 11/08/2018 CLINICAL DATA:  84 year old female with history of new onset of atrial fibrillation. Right ventricular dysfunction. EXAM: CT ANGIOGRAPHY CHEST WITH CONTRAST TECHNIQUE: Multidetector CT imaging of the chest was performed using the standard protocol during bolus administration of intravenous contrast. Multiplanar CT image reconstructions and MIPs were obtained to evaluate the vascular  anatomy. CONTRAST:  161mL OMNIPAQUE IOHEXOL 350 MG/ML SOLN COMPARISON:  Chest CTA 06/07/2017. FINDINGS: Cardiovascular: No filling defects within the pulmonary arterial tree to suggest underlying pulmonary embolism. Heart size is normal. There is no significant pericardial fluid, thickening or pericardial calcification. There is aortic atherosclerosis, as well as atherosclerosis of the great vessels of the mediastinum and the coronary arteries, including calcified atherosclerotic plaque in the left main, left anterior descending, left circumflex and right coronary arteries. Mediastinum/Nodes: Severe asymmetric enlargement of the thyroid gland (right lobe greater than left) which is heterogeneous in appearance with multiple partially calcified nodular areas, very similar to the prior examination, nonspecific but most compatible with a multinodular goiter. No pathologically enlarged mediastinal or hilar lymph nodes. Esophagus is unremarkable in appearance. No axillary lymphadenopathy. Lungs/Pleura: Calcified granuloma in the posterior aspect of the right lower lobe. In the periphery of the left upper lobe (axial image 58 of series 7) there is a 1.3 x 0.9 cm nodular density which appears very similar to prior study from 06/07/2017, presumably benign. No other definite suspicious appearing pulmonary nodules or masses are noted. No acute consolidative airspace disease. No pleural effusions. Upper Abdomen: Calcified gallstones in the gallbladder. Aortic atherosclerosis. Numerous colonic diverticulae in the region of the splenic flexure of the colon. Musculoskeletal: There are no aggressive appearing lytic or blastic lesions noted in the visualized portions of the skeleton. Review of the MIP images confirms the above findings. IMPRESSION: 1. No evidence of pulmonary embolism. 2. No acute findings are noted in the thorax to account for the patient's symptoms. 3. Aortic atherosclerosis, in addition to left main and 3 vessel  coronary artery disease. 4. Stable 1.3 x 0.9 cm nodular density in the periphery of the left upper lobe compared to 06/07/2017, presumably benign. 5. Cholelithiasis. 6. Colonic diverticulosis. 7. Asymmetric multinodular goiter redemonstrated. Aortic Atherosclerosis (ICD10-I70.0). Electronically Signed   By: Vinnie Langton M.D.   On: 11/08/2018 13:00   Ct Cervical Spine Wo Contrast  Result Date: 10/31/2018 CLINICAL DATA:  84 year old female with fall and head neck injury. Initial encounter. EXAM: CT HEAD WITHOUT CONTRAST CT CERVICAL SPINE WITHOUT CONTRAST TECHNIQUE: Multidetector CT imaging of the head and cervical spine was performed following the standard protocol without intravenous contrast. Multiplanar CT image reconstructions of the cervical spine were also generated. COMPARISON:  06/07/2017 head CT FINDINGS: CT HEAD FINDINGS Brain: No evidence of acute infarction, hemorrhage, hydrocephalus, extra-axial collection or mass lesion/mass effect. Atrophy and chronic small-vessel white matter ischemic changes again noted. Vascular: Carotid atherosclerotic calcifications  noted. Skull: Normal. Negative for fracture or focal lesion. Sinuses/Orbits: No acute finding. Other: None. CT CERVICAL SPINE FINDINGS Alignment: Normal. Skull base and vertebrae: No acute fracture. No primary bone lesion or focal pathologic process. Soft tissues and spinal canal: No prevertebral fluid or swelling. No visible canal hematoma. Disc levels: Multilevel degenerative disc disease/spondylosis noted, moderate from C5-C7. Moderate multilevel facet arthropathy identified. These findings contribute to mild to moderate central spinal and bony foraminal narrowing at several levels. Upper chest: No acute abnormality. Markedly enlarged thyroid gland noted, RIGHT greater than LEFT. Other: None IMPRESSION: 1. No evidence of acute intracranial abnormality. Atrophy and chronic small-vessel white matter ischemic changes. 2. No static evidence of  acute injury to the cervical spine. Degenerative changes as described. 3. Thyroid goiter. Electronically Signed   By: Margarette Canada M.D.   On: 10/31/2018 13:49   Ct Knee Right Wo Contrast  Result Date: 10/31/2018 CLINICAL DATA:  Right knee pain after falling. Evaluate distal femur fracture. EXAM: CT OF THE RIGHT KNEE WITHOUT CONTRAST TECHNIQUE: Multidetector CT imaging of the right knee was performed according to the standard protocol. Multiplanar CT image reconstructions were also generated. COMPARISON:  Radiographs same day. FINDINGS: Bones/Joint/Cartilage The bones are diffusely demineralized. There are advanced tricompartmental degenerative changes of the right knee, most advanced in the medial compartment. As seen on earlier radiographs, there is a comminuted and mildly displaced fracture of the distal femur. This fracture involves the distal metadiaphysis and demonstrates up to 9 mm of lateral displacement laterally. The fracture extends into the medial femoral epicondyle, but there is no involvement of the weight-bearing articular surface or intercondylar extension. The patella, proximal tibia and proximal fibula are intact. There is a small lipohemarthrosis, implying intra-articular extension of the distal femur fracture. Ligaments Suboptimally assessed by CT. The cruciate ligaments are grossly intact. Muscles and Tendons The extensor mechanism is intact.  No focal muscular abnormalities. Soft tissues There is a small Baker's cyst containing a loose body. Mild femoropopliteal atherosclerosis is noted. IMPRESSION: 1. Comminuted and mildly displaced intra-articular fracture of the distal femur with associated lipohemarthrosis. This fracture demonstrates no involvement of the articular surface of either femoral condyle or intercondylar extension, although does involve the medial femoral epicondyle. 2. Intact proximal tibia and fibula. 3. Advanced tricompartmental right knee osteoarthritis. Electronically  Signed   By: Richardean Sale M.D.   On: 10/31/2018 17:56   Mr Abdomen W Wo Contrast  Result Date: 11/09/2018 CLINICAL DATA:  Right kidney upper pole renal mass. Left mid kidney mass. EXAM: MRI ABDOMEN WITHOUT AND WITH CONTRAST TECHNIQUE: Multiplanar multisequence MR imaging of the abdomen was performed both before and after the administration of intravenous contrast. CONTRAST:  7.62mL GADAVIST GADOBUTROL 1 MMOL/ML IV SOLN COMPARISON:  Multiple exams, including 11/05/2018 CT scan FINDINGS: Despite efforts by the technologist and patient, motion artifact is present on today's exam and could not be eliminated. This reduces exam sensitivity and specificity. Lower chest: The peripheral left upper lobe lung nodule shown on recent chest CT of 11/08/2018 is visible on image 12/4. This is nonspecific by today's CT although is cold presumably benign on the prior chest CT. Mild cardiomegaly. Hepatobiliary: Multiple gallstones the gallbladder measuring up to 1.4 cm in diameter on image 18/4. No biliary dilatation or findings of choledocholithiasis. Suspected transient hepatic attenuation difference in the dome of the right hepatic lobe on image 13/14 measuring about 0.8 cm in diameter. There is early arterial phase enhancement in this vicinity which is not matched on any  other sequence. This could also represent a small area of focal nodular hyperplasia. Pancreas:  Unremarkable Spleen:  Unremarkable Adrenals/Urinary Tract:  Both adrenal glands appear normal. The right kidney upper pole exophytic mass measuring 2.9 by 2.2 cm on image 47/14 is heterogeneously enhancing compatible with malignancy. There is an additional T1 precontrast hyperintense lesion in the right mid to lower kidney. This measures 0.8 by 0.7 cm on image 65/12 and is difficult to characterize postcontrast due to small size, motion artifact, and misregistration on post-contrast images due to motion. An additional faint focus of accentuated T1 signal  posteriorly in the right mid kidney on image 57/12 is too small to characterize, about 0.70.5 cm. A 1.3 by 1.3 cm mildly T1 precontrast hyperintense lesion of the left kidney upper pole is shown on image 40/12. This has faint internal septation but no appreciable enhancement is considered Bosniak category 2. A 0.9 by 0.8 cm T1 hyperintense lesion of the left kidney upper pole on image 47/12 does not appear to enhance and is considered Bosniak category 2. A left mid kidney lesion measuring 1.1 by 1.0 cm has generally intermediate precontrast T1 signal and appears to have faint enhancing internal septa on subtraction images, versus low-grade enhancement. This lesion is considered Bosniak category IIF. Other small Bosniak category 1 and category 2 cysts are present bilaterally. Stomach/Bowel: Descending colon diverticulosis. Vascular/Lymphatic: Aortoiliac atherosclerotic vascular disease. No pathologic adenopathy. No appreciable tumor thrombus in the right renal vein. Other:  No supplemental non-categorized findings. Musculoskeletal: Lumbar spondylosis and degenerative disc disease. IMPRESSION: 1. Enhancing right kidney upper pole exophytic mass, 2.9 by 2.2 cm, most compatible with renal cell carcinoma. 2. The left mid kidney lesion measuring 1.1 by 1.0 cm is considered Bosniak category IIF (likely benign, but requiring surveillance). This has some internal lace-like enhancement probably reflecting internal septations. Follow up renal protocol MRI in 6 months time is recommended. This recommendation follows ACR consensus guidelines: Management of the Incidental Renal Mass on CT: A White Paper of the ACR Incidental Findings Committee. J Am Coll Radiol [phone]. 3. Other Bosniak category 1 and category 2 renal cysts are present. 4. Peripheral left upper lobe lung nodule as shown on recent chest CT. 5. Cholelithiasis. 6.  Aortic Atherosclerosis (ICD10-I70.0). 7. Transient hepatic attenuation difference versus small  area of focal nodular hyperplasia in the dome of the right hepatic lobe. This is considered benign. 8. Descending colon diverticulosis. 9. Lumbar spondylosis and degenerative disc disease. Electronically Signed   By: Van Clines M.D.   On: 11/09/2018 08:03   Ct Abdomen Pelvis W Contrast  Result Date: 11/05/2018 CLINICAL DATA:  Nausea vomiting. EXAM: CT ABDOMEN AND PELVIS WITH CONTRAST TECHNIQUE: Multidetector CT imaging of the abdomen and pelvis was performed using the standard protocol following bolus administration of intravenous contrast. CONTRAST:  181mL OMNIPAQUE IOHEXOL 300 MG/ML  SOLN COMPARISON:  Abdominal radiograph November 02, 2018, PET-CT May 05, 2017 FINDINGS: Lower chest: Right lower lobe calcified granuloma. Calcific atherosclerotic disease of the coronary arteries. Hepatobiliary: Normal appearance of the liver. Cholelithiasis without evidence of cholecystitis. Pancreas: Unremarkable. No pancreatic ductal dilatation or surrounding inflammatory changes. Spleen: Normal in size without focal abnormality. Adrenals/Urinary Tract: Normal adrenal glands. Exophytic right upper pole renal mass measures 2.9 cm. Indeterminate hypoattenuated circumscribed mass in the upper pole of the left kidney. No evidence of hydronephrosis or nephrolithiasis. Stomach/Bowel: Stomach is within normal limits. Appendix appears normal. No evidence of bowel wall thickening, distention, or inflammatory changes. Vascular/Lymphatic: Aortic atherosclerosis. No enlarged abdominal or  pelvic lymph nodes. Reproductive: Status post hysterectomy. No adnexal masses. Other: No abdominal wall hernia or abnormality. No abdominopelvic ascites. Musculoskeletal: Spondylosis of the spine. Moderate to advanced bilateral hip arthropathy. Partially visualized right femoral IM nail. IMPRESSION: 1. Exophytic right upper pole renal mass measures 2.9 cm, measured on prior PET-CT 2.3 cm. Indeterminate hypoattenuated circumscribed mass in the  upper pole of the left kidney measures 1.3 cm. Renal cell carcinoma cannot be excluded for either of these lesions. Further evaluation with abdominal MRI, renal protocol, may be considered if further imaging evaluation is desired. 2. Cholelithiasis without evidence of acute cholecystitis. 3. Calcific atherosclerotic disease of the coronary arteries and aorta. 4. Moderate to advanced bilateral hip arthropathy. 5. Partially visualized right femoral IM nail. Edema of the right upper thigh musculature, and overlying subcutaneous stranding, likely postsurgical. Electronically Signed   By: Fidela Salisbury M.D.   On: 11/05/2018 19:41   Dg Knee Complete 4 Views Right  Result Date: 10/31/2018 CLINICAL DATA:  Acute RIGHT knee pain following fall. Initial encounter. EXAM: RIGHT KNEE - COMPLETE 4+ VIEW COMPARISON:  None. FINDINGS: A mixed transverse/oblique fracture of the distal RIGHT femoral metadiaphysis is noted with 0.5 cm dorsal displacement. There is no definite intra-articular fracture extension identified. Severe tricompartmental degenerative changes are noted. There may be a tiny knee effusion present. No suspicious focal bony lesions are present. IMPRESSION: Distal RIGHT femoral fracture with 0.5 cm dorsal displacement. Severe tricompartmental degenerative changes. Electronically Signed   By: Margarette Canada M.D.   On: 10/31/2018 12:55   Dg C-arm 1-60 Min  Result Date: 11/01/2018 CLINICAL DATA:  Right femur surgery EXAM: RIGHT FEMUR 2 VIEWS; DG C-ARM 1-60 MIN COMPARISON:  10/31/2018 FINDINGS: Five low resolution intraoperative spot views of the right femur. Total fluoroscopy time was 1 minutes 28 seconds. The images demonstrate intramedullary rod and proximal and distal screw fixation of the right femur for comminuted distal femoral fracture. IMPRESSION: Intraoperative fluoroscopic assistance provided during surgical fixation of distal femur fracture Electronically Signed   By: Donavan Foil M.D.   On:  11/01/2018 19:14   Dg Hip Unilat W Or Wo Pelvis 2-3 Views Right  Result Date: 10/31/2018 CLINICAL DATA:  Status post fall EXAM: DG HIP (WITH OR WITHOUT PELVIS) 2-3V RIGHT COMPARISON:  None. FINDINGS: Bones: No fracture or dislocation. Normal bone mineralization. No periostitis. Joints: Normal alignment. No erosive changes. Severe osteoarthritis of bilateral hips with subchondral cystic changes, subchondral sclerosis and marginal osteophytes. Lower lumbar spine spondylosis. Soft tissue: No soft tissue abnormality. No radiopaque foreign body. No chondrocalcinosis. IMPRESSION: Severe osteoarthritis of bilateral hips. Electronically Signed   By: Kathreen Devoid   On: 10/31/2018 12:55   Dg Femur, Min 2 Views Right  Result Date: 11/01/2018 CLINICAL DATA:  Right femur surgery EXAM: RIGHT FEMUR 2 VIEWS; DG C-ARM 1-60 MIN COMPARISON:  10/31/2018 FINDINGS: Five low resolution intraoperative spot views of the right femur. Total fluoroscopy time was 1 minutes 28 seconds. The images demonstrate intramedullary rod and proximal and distal screw fixation of the right femur for comminuted distal femoral fracture. IMPRESSION: Intraoperative fluoroscopic assistance provided during surgical fixation of distal femur fracture Electronically Signed   By: Donavan Foil M.D.   On: 11/01/2018 19:14   Dg Femur Port, Min 2 Views Right  Result Date: 11/01/2018 CLINICAL DATA:  Postop EXAM: RIGHT FEMUR PORTABLE 2 VIEW COMPARISON:  11/01/2018, 10/31/2018 FINDINGS: Right femoral head projects in joint. Interval intramedullary rod and screw fixation of the right femur for comminuted distal femoral  fracture with decreased anterior to posterior displacement of fracture segments. Advanced tricompartment arthritis of the knee. Lipohemarthrosis appears less apparent. IMPRESSION: Interval intramedullary rod and screw fixation of the right femur for distal femoral fracture with expected postsurgical changes. Electronically Signed   By: Donavan Foil M.D.   On: 11/01/2018 19:20       Subjective: No acute events overnight Continues to improve [place] for tramsition to SNF for postop treatment  Discharge Exam: Vitals:   11/10/18 0452 11/10/18 0954  BP: (!) 144/51 (!) 160/68  Pulse: 69 (!) 56  Resp: (!) 21 16  Temp: 98.4 F (36.9 C) 98 F (36.7 C)  SpO2: 92% 97%   Vitals:   11/09/18 1659 11/09/18 2012 11/10/18 0452 11/10/18 0954  BP: (!) 153/45 (!) 168/54 (!) 144/51 (!) 160/68  Pulse: (!) 58 (!) 58 69 (!) 56  Resp: 14 19 (!) 21 16  Temp: 98.5 F (36.9 C) 98 F (36.7 C) 98.4 F (36.9 C) 98 F (36.7 C)  TempSrc: Oral Oral Oral Oral  SpO2: 95% 94% 92% 97%  Weight:      Height:        General: Pt is alert, awake, not in acute distress Cardiovascular: RRR, S1/S2 +, no rubs, no gallops Respiratory: CTA bilaterally, no wheezing, no rhonchi Abdominal: Soft, NT, ND, bowel sounds + Extremities: no edema, no cyanosis, rom limited 2/2 pain    The results of significant diagnostics from this hospitalization (including imaging, microbiology, ancillary and laboratory) are listed below for reference.     Microbiology: Recent Results (from the past 240 hour(s))  SARS CORONAVIRUS 2 (TAT 6-24 HRS) Nasopharyngeal Nasopharyngeal Swab     Status: None   Collection Time: 10/31/18 12:45 PM   Specimen: Nasopharyngeal Swab  Result Value Ref Range Status   SARS Coronavirus 2 NEGATIVE NEGATIVE Final    Comment: (NOTE) SARS-CoV-2 target nucleic acids are NOT DETECTED. The SARS-CoV-2 RNA is generally detectable in upper and lower respiratory specimens during the acute phase of infection. Negative results do not preclude SARS-CoV-2 infection, do not rule out co-infections with other pathogens, and should not be used as the sole basis for treatment or other patient management decisions. Negative results must be combined with clinical observations, patient history, and epidemiological information. The expected result is  Negative. Fact Sheet for Patients: SugarRoll.be Fact Sheet for Healthcare Providers: https://www.woods-mathews.com/ This test is not yet approved or cleared by the [place] FDA and  has been authorized for detection and/or diagnosis of SARS-CoV-2 by FDA under an Emergency Use Authorization (EUA). This EUA will remain  in effect (meaning this test can be used) for the duration of the COVID-19 declaration under Section 56 4(b)(1) of the Act, 21 U.S.C. section 360bbb-3(b)(1), unless the authorization is terminated or revoked sooner. Performed at [facility], [place] [address], [place], [place] [address]   Surgical pcr screen     Status: Abnormal   Collection Time: 10/31/18  5:50 PM   Specimen: Nasal Mucosa; Nasal Swab  Result Value Ref Range Status   MRSA, PCR POSITIVE (A) NEGATIVE Final    Comment: RESULT CALLED TO, READ BACK BY AND VERIFIED WITH: L COLLIE RN 1942 10/31/18 A BROWNING    Staphylococcus aureus POSITIVE (A) NEGATIVE Final    Comment: (NOTE) The Xpert SA Assay (FDA approved for NASAL specimens in patients 80 years of age and older), is one component of a comprehensive surveillance program. It is not intended to diagnose infection nor to guide  or monitor treatment. Performed at [facility], [place] [address] [place], [place] [address]   SARS CORONAVIRUS 2 (TAT 6-24 HRS) Nasopharyngeal Nasopharyngeal Swab     Status: None   Collection Time: 11/05/18  6:19 AM   Specimen: Nasopharyngeal Swab  Result Value Ref Range Status   SARS Coronavirus 2 NEGATIVE NEGATIVE Final    Comment: (NOTE) SARS-CoV-2 target nucleic acids are NOT DETECTED. The SARS-CoV-2 RNA is generally detectable in upper and lower respiratory specimens during the acute phase of infection. Negative results do not preclude SARS-CoV-2 infection, do not rule out co-infections with other pathogens, and should not be used as the sole basis for treatment or  other patient management decisions. Negative results must be combined with clinical observations, patient history, and epidemiological information. The expected result is Negative. Fact Sheet for Patients: SugarRoll.be Fact Sheet for Healthcare Providers: https://www.woods-mathews.com/ This test is not yet approved or cleared by the [place] FDA and  has been authorized for detection and/or diagnosis of SARS-CoV-2 by FDA under an Emergency Use Authorization (EUA). This EUA will remain  in effect (meaning this test can be used) for the duration of the COVID-19 declaration under Section 56 4(b)(1) of the Act, 21 U.S.C. section 360bbb-3(b)(1), unless the authorization is terminated or revoked sooner. Performed at [facility], [place] [address] [place], [place] [address]   SARS CORONAVIRUS 2 (TAT 6-24 HRS) Nasopharyngeal Nasopharyngeal Swab     Status: None   Collection Time: 11/09/18  9:01 AM   Specimen: Nasopharyngeal Swab  Result Value Ref Range Status   SARS Coronavirus 2 NEGATIVE NEGATIVE Final    Comment: (NOTE) SARS-CoV-2 target nucleic acids are NOT DETECTED. The SARS-CoV-2 RNA is generally detectable in upper and lower respiratory specimens during the acute phase of infection. Negative results do not preclude SARS-CoV-2 infection, do not rule out co-infections with other pathogens, and should not be used as the sole basis for treatment or other patient management decisions. Negative results must be combined with clinical observations, patient history, and epidemiological information. The expected result is Negative. Fact Sheet for Patients: SugarRoll.be Fact Sheet for Healthcare Providers: https://www.woods-mathews.com/ This test is not yet approved or cleared by the [place] FDA and  has been authorized for detection and/or diagnosis of SARS-CoV-2 by FDA under an Emergency Use  Authorization (EUA). This EUA will remain  in effect (meaning this test can be used) for the duration of the COVID-19 declaration under Section 56 4(b)(1) of the Act, 21 U.S.C. section 360bbb-3(b)(1), unless the authorization is terminated or revoked sooner. Performed at [facility], [place] [address] [place], [place] [address]      Labs: BNP (last 3 results) No results for input(s): BNP in the last 8760 hours. Basic Metabolic Panel: Recent Labs  Lab 11/04/18 0154 11/05/18 0430 11/06/18 0306 11/07/18 0708 11/09/18 0954  NA 136 138 139 140 138  K 4.0 3.8 3.7 3.6 3.8  CL 101 102 103 102 101  CO2 19* 24 25 25 25   GLUCOSE 145* 134* 118* 139* 120*  BUN 18 22 21 16 16   CREATININE 0.93 0.98 0.96 0.98 0.96  CALCIUM 7.9* 8.2* 8.0* 7.9* 8.2*  MG  --  2.0 1.9 1.8 2.0   Liver Function Tests: Recent Labs  Lab 11/05/18 0430 11/06/18 0306 11/07/18 0708  AST 64* 54* 30  ALT 8 7 10   ALKPHOS 77 82 90  BILITOT 0.9 1.4* 1.0  PROT 6.1* 5.6* 5.5*  ALBUMIN 2.9* 2.8*  2.8*   Recent Labs  Lab 11/06/18 0306  LIPASE 29   No results for input(s): AMMONIA in the last 168 hours. CBC: Recent Labs  Lab 11/04/18 0154 11/05/18 0430 11/06/18 0306 11/07/18 0708  WBC 5.0 5.4 5.0 5.0  HGB 8.0* 9.1* 8.6* 9.5*  HCT 23.1* 28.4* 26.4* 29.7*  MCV 95.1 97.9 97.4 97.7  PLT 161 172 174 237   Cardiac Enzymes: No results for input(s): CKTOTAL, CKMB, CKMBINDEX, TROPONINI in the last 168 hours. BNP: Invalid input(s): POCBNP CBG: Recent Labs  Lab 11/09/18 0935 11/09/18 1358 11/09/18 1702 11/09/18 2139 11/10/18 0656  GLUCAP 115* 154* 129* 104* 124*   D-Dimer No results for input(s): DDIMER in the last 72 hours. Hgb A1c No results for input(s): HGBA1C in the last 72 hours. Lipid Profile No results for input(s): CHOL, HDL, LDLCALC, TRIG, CHOLHDL, LDLDIRECT in the last 72 hours. Thyroid function studies Recent Labs    11/07/18 1332  TSH 0.978   Anemia work up No results for  input(s): VITAMINB12, FOLATE, FERRITIN, TIBC, IRON, RETICCTPCT in the last 72 hours. Urinalysis    Component Value Date/Time   COLORURINE YELLOW 11/02/2018 0558   APPEARANCEUR CLEAR 11/02/2018 0558   LABSPEC 1.017 11/02/2018 0558   PHURINE 5.0 11/02/2018 0558   GLUCOSEU NEGATIVE 11/02/2018 0558   HGBUR SMALL (A) 11/02/2018 0558   BILIRUBINUR NEGATIVE 11/02/2018 0558   KETONESUR 80 (A) 11/02/2018 0558   PROTEINUR 30 (A) 11/02/2018 0558   NITRITE NEGATIVE 11/02/2018 0558   LEUKOCYTESUR MODERATE (A) 11/02/2018 0558   Sepsis Labs Invalid input(s): PROCALCITONIN,  WBC,  LACTICIDVEN Microbiology Recent Results (from the past 240 hour(s))  SARS CORONAVIRUS 2 (TAT 6-24 HRS) Nasopharyngeal Nasopharyngeal Swab     Status: None   Collection Time: 10/31/18 12:45 PM   Specimen: Nasopharyngeal Swab  Result Value Ref Range Status   SARS Coronavirus 2 NEGATIVE NEGATIVE Final    Comment: (NOTE) SARS-CoV-2 target nucleic acids are NOT DETECTED. The SARS-CoV-2 RNA is generally detectable in upper and lower respiratory specimens during the acute phase of infection. Negative results do not preclude SARS-CoV-2 infection, do not rule out co-infections with other pathogens, and should not be used as the sole basis for treatment or other patient management decisions. Negative results must be combined with clinical observations, patient history, and epidemiological information. The expected result is Negative. Fact Sheet for Patients: SugarRoll.be Fact Sheet for Healthcare Providers: https://www.woods-mathews.com/ This test is not yet approved or cleared by the [place] FDA and  has been authorized for detection and/or diagnosis of SARS-CoV-2 by FDA under an Emergency Use Authorization (EUA). This EUA will remain  in effect (meaning this test can be used) for the duration of the COVID-19 declaration under Section 56 4(b)(1) of the Act, 21 U.S.C. section  360bbb-3(b)(1), unless the authorization is terminated or revoked sooner. Performed at [facility], [place] [address], [place], [place] [address]   Surgical pcr screen     Status: Abnormal   Collection Time: 10/31/18  5:50 PM   Specimen: Nasal Mucosa; Nasal Swab  Result Value Ref Range Status   MRSA, PCR POSITIVE (A) NEGATIVE Final    Comment: RESULT CALLED TO, READ BACK BY AND VERIFIED WITH: L COLLIE RN 1942 10/31/18 A BROWNING    Staphylococcus aureus POSITIVE (A) NEGATIVE Final    Comment: (NOTE) The Xpert SA Assay (FDA approved for NASAL specimens in patients 38 years of age and older), is one component of a comprehensive surveillance program. It is  not intended to diagnose infection nor to guide or monitor treatment. Performed at [facility], [place] [address] [place], [place] [address]   SARS CORONAVIRUS 2 (TAT 6-24 HRS) Nasopharyngeal Nasopharyngeal Swab     Status: None   Collection Time: 11/05/18  6:19 AM   Specimen: Nasopharyngeal Swab  Result Value Ref Range Status   SARS Coronavirus 2 NEGATIVE NEGATIVE Final    Comment: (NOTE) SARS-CoV-2 target nucleic acids are NOT DETECTED. The SARS-CoV-2 RNA is generally detectable in upper and lower respiratory specimens during the acute phase of infection. Negative results do not preclude SARS-CoV-2 infection, do not rule out co-infections with other pathogens, and should not be used as the sole basis for treatment or other patient management decisions. Negative results must be combined with clinical observations, patient history, and epidemiological information. The expected result is Negative. Fact Sheet for Patients: SugarRoll.be Fact Sheet for Healthcare Providers: https://www.woods-mathews.com/ This test is not yet approved or cleared by the [place] FDA and  has been authorized for detection and/or diagnosis of SARS-CoV-2 by FDA under an Emergency Use Authorization  (EUA). This EUA will remain  in effect (meaning this test can be used) for the duration of the COVID-19 declaration under Section 56 4(b)(1) of the Act, 21 U.S.C. section 360bbb-3(b)(1), unless the authorization is terminated or revoked sooner. Performed at [facility], [place] [address], [place], [place] [address]   SARS CORONAVIRUS 2 (TAT 6-24 HRS) Nasopharyngeal Nasopharyngeal Swab     Status: None   Collection Time: 11/09/18  9:01 AM   Specimen: Nasopharyngeal Swab  Result Value Ref Range Status   SARS Coronavirus 2 NEGATIVE NEGATIVE Final    Comment: (NOTE) SARS-CoV-2 target nucleic acids are NOT DETECTED. The SARS-CoV-2 RNA is generally detectable in upper and lower respiratory specimens during the acute phase of infection. Negative results do not preclude SARS-CoV-2 infection, do not rule out co-infections with other pathogens, and should not be used as the sole basis for treatment or other patient management decisions. Negative results must be combined with clinical observations, patient history, and epidemiological information. The expected result is Negative. Fact Sheet for Patients: SugarRoll.be Fact Sheet for Healthcare Providers: https://www.woods-mathews.com/ This test is not yet approved or cleared by the [place] FDA and  has been authorized for detection and/or diagnosis of SARS-CoV-2 by FDA under an Emergency Use Authorization (EUA). This EUA will remain  in effect (meaning this test can be used) for the duration of the COVID-19 declaration under Section 56 4(b)(1) of the Act, 21 U.S.C. section 360bbb-3(b)(1), unless the authorization is terminated or revoked sooner. Performed at [facility], [place] [address], [place], [place] [address]      Time coordinating discharge: Over 30 minutes  SIGNED:   Nicolette Bang, MD  [facility] 11/10/2018, 10:05 AM Pager   If 7PM-7AM, please  contact night-coverage [email or web address] Password [email or web address]

## 2018-11-14 DIAGNOSIS — M6281 Muscle weakness (generalized): Secondary | ICD-10-CM | POA: Diagnosis not present

## 2018-11-14 DIAGNOSIS — R5381 Other malaise: Secondary | ICD-10-CM | POA: Diagnosis not present

## 2018-11-14 DIAGNOSIS — M1711 Unilateral primary osteoarthritis, right knee: Secondary | ICD-10-CM | POA: Diagnosis not present

## 2018-11-14 DIAGNOSIS — S72441D Displaced fracture of lower epiphysis (separation) of right femur, subsequent encounter for closed fracture with routine healing: Secondary | ICD-10-CM | POA: Diagnosis not present

## 2018-11-16 DIAGNOSIS — S72451D Displaced supracondylar fracture without intracondylar extension of lower end of right femur, subsequent encounter for closed fracture with routine healing: Secondary | ICD-10-CM | POA: Diagnosis not present

## 2018-11-17 DIAGNOSIS — G20 Parkinson's disease: Secondary | ICD-10-CM | POA: Diagnosis not present

## 2018-11-17 DIAGNOSIS — F329 Major depressive disorder, single episode, unspecified: Secondary | ICD-10-CM | POA: Diagnosis not present

## 2018-11-17 DIAGNOSIS — S72441D Displaced fracture of lower epiphysis (separation) of right femur, subsequent encounter for closed fracture with routine healing: Secondary | ICD-10-CM | POA: Diagnosis not present

## 2018-11-17 DIAGNOSIS — I480 Paroxysmal atrial fibrillation: Secondary | ICD-10-CM | POA: Diagnosis not present

## 2018-11-18 DIAGNOSIS — N390 Urinary tract infection, site not specified: Secondary | ICD-10-CM | POA: Diagnosis not present

## 2018-11-18 DIAGNOSIS — R300 Dysuria: Secondary | ICD-10-CM | POA: Diagnosis not present

## 2018-11-18 DIAGNOSIS — B9620 Unspecified Escherichia coli [E. coli] as the cause of diseases classified elsewhere: Secondary | ICD-10-CM | POA: Diagnosis not present

## 2018-11-18 DIAGNOSIS — K649 Unspecified hemorrhoids: Secondary | ICD-10-CM | POA: Diagnosis not present

## 2018-11-25 DIAGNOSIS — N390 Urinary tract infection, site not specified: Secondary | ICD-10-CM | POA: Diagnosis not present

## 2018-11-25 DIAGNOSIS — B9620 Unspecified Escherichia coli [E. coli] as the cause of diseases classified elsewhere: Secondary | ICD-10-CM | POA: Diagnosis not present

## 2018-11-25 DIAGNOSIS — R300 Dysuria: Secondary | ICD-10-CM | POA: Diagnosis not present

## 2018-11-28 DIAGNOSIS — B9620 Unspecified Escherichia coli [E. coli] as the cause of diseases classified elsewhere: Secondary | ICD-10-CM | POA: Diagnosis not present

## 2018-11-28 DIAGNOSIS — R300 Dysuria: Secondary | ICD-10-CM | POA: Diagnosis not present

## 2018-11-28 DIAGNOSIS — B379 Candidiasis, unspecified: Secondary | ICD-10-CM | POA: Diagnosis not present

## 2018-11-28 DIAGNOSIS — N390 Urinary tract infection, site not specified: Secondary | ICD-10-CM | POA: Diagnosis not present

## 2018-12-02 DIAGNOSIS — Z87440 Personal history of urinary (tract) infections: Secondary | ICD-10-CM | POA: Diagnosis not present

## 2018-12-02 DIAGNOSIS — R300 Dysuria: Secondary | ICD-10-CM | POA: Diagnosis not present

## 2018-12-09 DIAGNOSIS — Z87440 Personal history of urinary (tract) infections: Secondary | ICD-10-CM | POA: Diagnosis not present

## 2018-12-09 DIAGNOSIS — R300 Dysuria: Secondary | ICD-10-CM | POA: Diagnosis not present

## 2018-12-09 DIAGNOSIS — R52 Pain, unspecified: Secondary | ICD-10-CM | POA: Diagnosis not present

## 2018-12-14 DIAGNOSIS — S72451D Displaced supracondylar fracture without intracondylar extension of lower end of right femur, subsequent encounter for closed fracture with routine healing: Secondary | ICD-10-CM | POA: Diagnosis not present

## 2018-12-26 DIAGNOSIS — B9620 Unspecified Escherichia coli [E. coli] as the cause of diseases classified elsewhere: Secondary | ICD-10-CM | POA: Diagnosis not present

## 2018-12-26 DIAGNOSIS — E118 Type 2 diabetes mellitus with unspecified complications: Secondary | ICD-10-CM | POA: Diagnosis not present

## 2018-12-26 DIAGNOSIS — Z87440 Personal history of urinary (tract) infections: Secondary | ICD-10-CM | POA: Diagnosis not present

## 2018-12-26 DIAGNOSIS — N390 Urinary tract infection, site not specified: Secondary | ICD-10-CM | POA: Diagnosis not present

## 2018-12-29 DIAGNOSIS — G20 Parkinson's disease: Secondary | ICD-10-CM | POA: Diagnosis not present

## 2018-12-29 DIAGNOSIS — R52 Pain, unspecified: Secondary | ICD-10-CM | POA: Diagnosis not present

## 2018-12-29 DIAGNOSIS — S72441D Displaced fracture of lower epiphysis (separation) of right femur, subsequent encounter for closed fracture with routine healing: Secondary | ICD-10-CM | POA: Diagnosis not present

## 2018-12-30 DIAGNOSIS — R300 Dysuria: Secondary | ICD-10-CM | POA: Diagnosis not present

## 2018-12-30 DIAGNOSIS — B9620 Unspecified Escherichia coli [E. coli] as the cause of diseases classified elsewhere: Secondary | ICD-10-CM | POA: Diagnosis not present

## 2018-12-30 DIAGNOSIS — N390 Urinary tract infection, site not specified: Secondary | ICD-10-CM | POA: Diagnosis not present

## 2019-01-02 DIAGNOSIS — B9620 Unspecified Escherichia coli [E. coli] as the cause of diseases classified elsewhere: Secondary | ICD-10-CM | POA: Diagnosis not present

## 2019-01-02 DIAGNOSIS — N390 Urinary tract infection, site not specified: Secondary | ICD-10-CM | POA: Diagnosis not present

## 2019-01-02 DIAGNOSIS — R300 Dysuria: Secondary | ICD-10-CM | POA: Diagnosis not present

## 2019-01-25 DIAGNOSIS — F411 Generalized anxiety disorder: Secondary | ICD-10-CM | POA: Diagnosis not present

## 2019-01-25 DIAGNOSIS — S72451D Displaced supracondylar fracture without intracondylar extension of lower end of right femur, subsequent encounter for closed fracture with routine healing: Secondary | ICD-10-CM | POA: Diagnosis not present

## 2019-01-25 DIAGNOSIS — F329 Major depressive disorder, single episode, unspecified: Secondary | ICD-10-CM | POA: Diagnosis not present

## 2019-01-27 DIAGNOSIS — G250 Essential tremor: Secondary | ICD-10-CM | POA: Diagnosis not present

## 2019-01-27 DIAGNOSIS — R2689 Other abnormalities of gait and mobility: Secondary | ICD-10-CM | POA: Diagnosis not present

## 2019-01-27 DIAGNOSIS — S72441D Displaced fracture of lower epiphysis (separation) of right femur, subsequent encounter for closed fracture with routine healing: Secondary | ICD-10-CM | POA: Diagnosis not present

## 2019-01-27 DIAGNOSIS — Z741 Need for assistance with personal care: Secondary | ICD-10-CM | POA: Diagnosis not present

## 2019-01-27 DIAGNOSIS — M6281 Muscle weakness (generalized): Secondary | ICD-10-CM | POA: Diagnosis not present

## 2019-01-31 DIAGNOSIS — S72441D Displaced fracture of lower epiphysis (separation) of right femur, subsequent encounter for closed fracture with routine healing: Secondary | ICD-10-CM | POA: Diagnosis not present

## 2019-01-31 DIAGNOSIS — R2689 Other abnormalities of gait and mobility: Secondary | ICD-10-CM | POA: Diagnosis not present

## 2019-01-31 DIAGNOSIS — Z741 Need for assistance with personal care: Secondary | ICD-10-CM | POA: Diagnosis not present

## 2019-01-31 DIAGNOSIS — M6281 Muscle weakness (generalized): Secondary | ICD-10-CM | POA: Diagnosis not present

## 2019-01-31 DIAGNOSIS — G250 Essential tremor: Secondary | ICD-10-CM | POA: Diagnosis not present

## 2019-02-01 DIAGNOSIS — R2689 Other abnormalities of gait and mobility: Secondary | ICD-10-CM | POA: Diagnosis not present

## 2019-02-01 DIAGNOSIS — F411 Generalized anxiety disorder: Secondary | ICD-10-CM | POA: Diagnosis not present

## 2019-02-01 DIAGNOSIS — G250 Essential tremor: Secondary | ICD-10-CM | POA: Diagnosis not present

## 2019-02-01 DIAGNOSIS — Z741 Need for assistance with personal care: Secondary | ICD-10-CM | POA: Diagnosis not present

## 2019-02-01 DIAGNOSIS — S72441D Displaced fracture of lower epiphysis (separation) of right femur, subsequent encounter for closed fracture with routine healing: Secondary | ICD-10-CM | POA: Diagnosis not present

## 2019-02-01 DIAGNOSIS — Z23 Encounter for immunization: Secondary | ICD-10-CM | POA: Diagnosis not present

## 2019-02-01 DIAGNOSIS — M6281 Muscle weakness (generalized): Secondary | ICD-10-CM | POA: Diagnosis not present

## 2019-02-02 DIAGNOSIS — G20 Parkinson's disease: Secondary | ICD-10-CM | POA: Diagnosis not present

## 2019-02-02 DIAGNOSIS — F411 Generalized anxiety disorder: Secondary | ICD-10-CM | POA: Diagnosis not present

## 2019-02-02 DIAGNOSIS — F331 Major depressive disorder, recurrent, moderate: Secondary | ICD-10-CM | POA: Diagnosis not present

## 2019-02-02 DIAGNOSIS — G4700 Insomnia, unspecified: Secondary | ICD-10-CM | POA: Diagnosis not present

## 2019-02-03 DIAGNOSIS — S72441D Displaced fracture of lower epiphysis (separation) of right femur, subsequent encounter for closed fracture with routine healing: Secondary | ICD-10-CM | POA: Diagnosis not present

## 2019-02-03 DIAGNOSIS — M6281 Muscle weakness (generalized): Secondary | ICD-10-CM | POA: Diagnosis not present

## 2019-02-03 DIAGNOSIS — G250 Essential tremor: Secondary | ICD-10-CM | POA: Diagnosis not present

## 2019-02-03 DIAGNOSIS — Z741 Need for assistance with personal care: Secondary | ICD-10-CM | POA: Diagnosis not present

## 2019-02-03 DIAGNOSIS — R296 Repeated falls: Secondary | ICD-10-CM | POA: Diagnosis not present

## 2019-02-03 DIAGNOSIS — M25562 Pain in left knee: Secondary | ICD-10-CM | POA: Diagnosis not present

## 2019-02-03 DIAGNOSIS — R531 Weakness: Secondary | ICD-10-CM | POA: Diagnosis not present

## 2019-02-03 DIAGNOSIS — R2689 Other abnormalities of gait and mobility: Secondary | ICD-10-CM | POA: Diagnosis not present

## 2019-02-05 ENCOUNTER — Encounter (HOSPITAL_COMMUNITY): Payer: Self-pay

## 2019-02-05 ENCOUNTER — Other Ambulatory Visit: Payer: Self-pay

## 2019-02-05 ENCOUNTER — Emergency Department (HOSPITAL_COMMUNITY): Payer: Medicare Other

## 2019-02-05 ENCOUNTER — Inpatient Hospital Stay (HOSPITAL_COMMUNITY)
Admission: EM | Admit: 2019-02-05 | Discharge: 2019-02-11 | DRG: 178 | Disposition: A | Discharge status: Skilled Nursing Facility | Payer: Medicare Other | Source: Skilled Nursing Facility | Attending: Family Medicine | Admitting: Family Medicine

## 2019-02-05 DIAGNOSIS — I10 Essential (primary) hypertension: Secondary | ICD-10-CM | POA: Diagnosis present

## 2019-02-05 DIAGNOSIS — S72441D Displaced fracture of lower epiphysis (separation) of right femur, subsequent encounter for closed fracture with routine healing: Secondary | ICD-10-CM | POA: Diagnosis not present

## 2019-02-05 DIAGNOSIS — Z7901 Long term (current) use of anticoagulants: Secondary | ICD-10-CM

## 2019-02-05 DIAGNOSIS — R001 Bradycardia, unspecified: Secondary | ICD-10-CM | POA: Diagnosis not present

## 2019-02-05 DIAGNOSIS — R05 Cough: Secondary | ICD-10-CM

## 2019-02-05 DIAGNOSIS — K219 Gastro-esophageal reflux disease without esophagitis: Secondary | ICD-10-CM | POA: Diagnosis present

## 2019-02-05 DIAGNOSIS — R262 Difficulty in walking, not elsewhere classified: Secondary | ICD-10-CM | POA: Diagnosis present

## 2019-02-05 DIAGNOSIS — Z87891 Personal history of nicotine dependence: Secondary | ICD-10-CM | POA: Diagnosis not present

## 2019-02-05 DIAGNOSIS — G20 Parkinson's disease: Secondary | ICD-10-CM | POA: Diagnosis present

## 2019-02-05 DIAGNOSIS — D696 Thrombocytopenia, unspecified: Secondary | ICD-10-CM | POA: Diagnosis present

## 2019-02-05 DIAGNOSIS — L89152 Pressure ulcer of sacral region, stage 2: Secondary | ICD-10-CM | POA: Diagnosis present

## 2019-02-05 DIAGNOSIS — L8990 Pressure ulcer of unspecified site, unspecified stage: Secondary | ICD-10-CM | POA: Insufficient documentation

## 2019-02-05 DIAGNOSIS — Z66 Do not resuscitate: Secondary | ICD-10-CM | POA: Diagnosis present

## 2019-02-05 DIAGNOSIS — R059 Cough, unspecified: Secondary | ICD-10-CM

## 2019-02-05 DIAGNOSIS — R5381 Other malaise: Secondary | ICD-10-CM | POA: Diagnosis not present

## 2019-02-05 DIAGNOSIS — E119 Type 2 diabetes mellitus without complications: Secondary | ICD-10-CM | POA: Diagnosis present

## 2019-02-05 DIAGNOSIS — G250 Essential tremor: Secondary | ICD-10-CM | POA: Diagnosis present

## 2019-02-05 DIAGNOSIS — E1142 Type 2 diabetes mellitus with diabetic polyneuropathy: Secondary | ICD-10-CM | POA: Diagnosis present

## 2019-02-05 DIAGNOSIS — C641 Malignant neoplasm of right kidney, except renal pelvis: Secondary | ICD-10-CM | POA: Diagnosis present

## 2019-02-05 DIAGNOSIS — I959 Hypotension, unspecified: Secondary | ICD-10-CM | POA: Diagnosis not present

## 2019-02-05 DIAGNOSIS — E785 Hyperlipidemia, unspecified: Secondary | ICD-10-CM | POA: Diagnosis present

## 2019-02-05 DIAGNOSIS — R4189 Other symptoms and signs involving cognitive functions and awareness: Secondary | ICD-10-CM | POA: Diagnosis present

## 2019-02-05 DIAGNOSIS — Z79891 Long term (current) use of opiate analgesic: Secondary | ICD-10-CM | POA: Diagnosis not present

## 2019-02-05 DIAGNOSIS — Z7984 Long term (current) use of oral hypoglycemic drugs: Secondary | ICD-10-CM | POA: Diagnosis not present

## 2019-02-05 DIAGNOSIS — Z741 Need for assistance with personal care: Secondary | ICD-10-CM | POA: Diagnosis present

## 2019-02-05 DIAGNOSIS — R0902 Hypoxemia: Secondary | ICD-10-CM | POA: Diagnosis present

## 2019-02-05 DIAGNOSIS — Z79899 Other long term (current) drug therapy: Secondary | ICD-10-CM

## 2019-02-05 DIAGNOSIS — I480 Paroxysmal atrial fibrillation: Secondary | ICD-10-CM | POA: Diagnosis present

## 2019-02-05 DIAGNOSIS — R0602 Shortness of breath: Secondary | ICD-10-CM | POA: Diagnosis not present

## 2019-02-05 DIAGNOSIS — Z888 Allergy status to other drugs, medicaments and biological substances status: Secondary | ICD-10-CM | POA: Diagnosis not present

## 2019-02-05 DIAGNOSIS — B964 Proteus (mirabilis) (morganii) as the cause of diseases classified elsewhere: Secondary | ICD-10-CM | POA: Diagnosis present

## 2019-02-05 DIAGNOSIS — D500 Iron deficiency anemia secondary to blood loss (chronic): Secondary | ICD-10-CM | POA: Diagnosis present

## 2019-02-05 DIAGNOSIS — L89321 Pressure ulcer of left buttock, stage 1: Secondary | ICD-10-CM | POA: Diagnosis present

## 2019-02-05 DIAGNOSIS — Z7401 Bed confinement status: Secondary | ICD-10-CM | POA: Diagnosis not present

## 2019-02-05 DIAGNOSIS — U071 COVID-19: Principal | ICD-10-CM | POA: Diagnosis present

## 2019-02-05 DIAGNOSIS — N390 Urinary tract infection, site not specified: Secondary | ICD-10-CM | POA: Diagnosis present

## 2019-02-05 DIAGNOSIS — Z90710 Acquired absence of both cervix and uterus: Secondary | ICD-10-CM

## 2019-02-05 DIAGNOSIS — D649 Anemia, unspecified: Secondary | ICD-10-CM | POA: Diagnosis present

## 2019-02-05 DIAGNOSIS — M6281 Muscle weakness (generalized): Secondary | ICD-10-CM | POA: Diagnosis present

## 2019-02-05 DIAGNOSIS — M2550 Pain in unspecified joint: Secondary | ICD-10-CM | POA: Diagnosis not present

## 2019-02-05 LAB — URINALYSIS, ROUTINE W REFLEX MICROSCOPIC
Bilirubin Urine: NEGATIVE
Glucose, UA: NEGATIVE mg/dL
Hgb urine dipstick: NEGATIVE
Ketones, ur: NEGATIVE mg/dL
Nitrite: NEGATIVE
Protein, ur: 100 mg/dL — AB
Specific Gravity, Urine: 1.02 (ref 1.005–1.030)
WBC, UA: >50 WBC/hpf — ABNORMAL HIGH (ref 0–5)
pH: 7 (ref 5.0–8.0)

## 2019-02-05 LAB — COMPREHENSIVE METABOLIC PANEL
ALT: 6 U/L (ref 0–44)
AST: 16 U/L (ref 15–41)
Albumin: 3.3 g/dL — ABNORMAL LOW (ref 3.5–5.0)
Alkaline Phosphatase: 106 U/L (ref 38–126)
Anion gap: 9 (ref 5–15)
BUN: 23 mg/dL (ref 8–23)
CO2: 26 mmol/L (ref 22–32)
Calcium: 8.6 mg/dL — ABNORMAL LOW (ref 8.9–10.3)
Chloride: 105 mmol/L (ref 98–111)
Creatinine, Ser: 1.15 mg/dL — ABNORMAL HIGH (ref 0.44–1.00)
GFR calc Af Amer: 51 mL/min — ABNORMAL LOW (ref >60)
GFR calc non Af Amer: 44 mL/min — ABNORMAL LOW (ref >60)
Glucose, Bld: 142 mg/dL — ABNORMAL HIGH (ref 70–99)
Potassium: 3.6 mmol/L (ref 3.5–5.1)
Sodium: 140 mmol/L (ref 135–145)
Total Bilirubin: 0.5 mg/dL (ref 0.3–1.2)
Total Protein: 6.8 g/dL (ref 6.5–8.1)

## 2019-02-05 LAB — CBC WITH DIFFERENTIAL/PLATELET
Abs Immature Granulocytes: 0.01 10*3/uL (ref 0.00–0.07)
Basophils Absolute: 0 10*3/uL (ref 0.0–0.1)
Basophils Relative: 0 %
Eosinophils Absolute: 0 10*3/uL (ref 0.0–0.5)
Eosinophils Relative: 0 %
HCT: 37.9 % (ref 36.0–46.0)
Hemoglobin: 11.9 g/dL — ABNORMAL LOW (ref 12.0–15.0)
Immature Granulocytes: 0 %
Lymphocytes Relative: 26 %
Lymphs Abs: 0.9 10*3/uL (ref 0.7–4.0)
MCH: 31.6 pg (ref 26.0–34.0)
MCHC: 31.4 g/dL (ref 30.0–36.0)
MCV: 100.5 fL — ABNORMAL HIGH (ref 80.0–100.0)
Monocytes Absolute: 0.5 10*3/uL (ref 0.1–1.0)
Monocytes Relative: 14 %
Neutro Abs: 2 10*3/uL (ref 1.7–7.7)
Neutrophils Relative %: 60 %
Platelets: 113 10*3/uL — ABNORMAL LOW (ref 150–400)
RBC: 3.77 MIL/uL — ABNORMAL LOW (ref 3.87–5.11)
RDW: 15.3 % (ref 11.5–15.5)
WBC: 3.3 10*3/uL — ABNORMAL LOW (ref 4.0–10.5)
nRBC: 0 % (ref 0.0–0.2)

## 2019-02-05 LAB — D-DIMER, QUANTITATIVE: D-Dimer, Quant: 0.76 ug/mL-FEU — ABNORMAL HIGH (ref 0.00–0.50)

## 2019-02-05 LAB — TROPONIN I (HIGH SENSITIVITY)
Troponin I (High Sensitivity): 16 ng/L (ref <18)
Troponin I (High Sensitivity): 15 ng/L (ref <18)

## 2019-02-05 LAB — C-REACTIVE PROTEIN: CRP: 8.4 mg/dL — ABNORMAL HIGH (ref <1.0)

## 2019-02-05 LAB — TSH: TSH: 0.182 u[IU]/mL — ABNORMAL LOW (ref 0.350–4.500)

## 2019-02-05 LAB — POC SARS CORONAVIRUS 2 AG -  ED: SARS Coronavirus 2 Ag: POSITIVE — AB

## 2019-02-05 LAB — MAGNESIUM: Magnesium: 1.8 mg/dL (ref 1.7–2.4)

## 2019-02-05 LAB — LACTIC ACID, PLASMA: Lactic Acid, Venous: 0.8 mmol/L (ref 0.5–1.9)

## 2019-02-05 LAB — FIBRINOGEN: Fibrinogen: 503 mg/dL — ABNORMAL HIGH (ref 210–475)

## 2019-02-05 LAB — FERRITIN: Ferritin: 189 ng/mL (ref 11–307)

## 2019-02-05 LAB — LIPASE, BLOOD: Lipase: 24 U/L (ref 11–51)

## 2019-02-05 MED ORDER — SODIUM CHLORIDE 0.9 % IV SOLN: 100.0000 mg | Freq: Every day | INTRAVENOUS | Status: DC

## 2019-02-05 MED ORDER — SODIUM CHLORIDE 0.9 % IV SOLN
200.0000 mg | Freq: Once | INTRAVENOUS | Status: AC
  Administered 2019-02-05: 200 mg via INTRAVENOUS
  Filled 2019-02-05: qty 40

## 2019-02-05 MED ORDER — SODIUM CHLORIDE 0.9 % IV BOLUS
500.0000 mL | Freq: Once | INTRAVENOUS | Status: AC
  Administered 2019-02-05: 500 mL via INTRAVENOUS

## 2019-02-05 NOTE — H&P (Signed)
History and Physical    Kristen Mason [id number] DOB: Jun 01, 1934 DOA: 02/05/2019  PCP: Wenda Low, MD  Patient coming from:  SNF [facility]   Chief Complaint: Patient was referred for low oxygen levels. 2.  She was recently diagnosed with COVID-19  HPI: Kristen Mason is a 85 y.o. female with medical history significant of Parkinson disease, hypertension, paroxysmal atrial fibrillation on anticoagulation type 2 diabetes, hyperlipidemia and cognitive impairment.  I should emphasize that due to her cognitive impairment, she was unable to give a very reliable history.  I therefore obtain my history by reviewing electronic medical records and from her daughter.  Patient has been at her baseline until she tested positive for COVID-19 on 02/02/2019 at her skilled nursing facility.  However, today, she was noted to have worsening shortness of breath and hypoxia with oxygen saturations in the 80s.  At her bedside, patient did not have any new complaints and denies chest pain, nausea, vomiting, diarrhea, loss of smell or taste, headache or loss of consciousness.  ED Course: At the ED, patient was hemodynamically stable and febrile with a temperature of 100.9.  She was also bradycardic with pulse rate in the 50s.  Patient was saturating 99% on 2 L oxygen  Review of Systems: As per HPI otherwise 10 point review of systems negative.    Past Medical History:  Diagnosis Date  . Diabetes ([place])   . Essential tremor   . Fall 06/07/2017   rib fractures  . GERD (gastroesophageal reflux disease)   . Hypertension   . Peripheral neuropathy     Past Surgical History:  Procedure Laterality Date  . ABDOMINAL HYSTERECTOMY  1975  . BREAST LUMPECTOMY Right    non cancerous  . FEMUR IM NAIL Right 11/01/2018   Procedure: INTRAMEDULLARY (IM) RETROGRADE FEMORAL NAILING;  Surgeon: Altamese Burdett, MD;  Location: [place];  Service: Orthopedics;  Laterality: Right;     reports that she has quit  smoking. Her smoking use included cigarettes. She has quit using smokeless tobacco. She reports that she does not drink alcohol or use drugs.  Allergies  Allergen Reactions  . Statins Other (See Comments)    No family history on file. Unable to obtain at this time  Prior to Admission medications   Medication Sig Start Date End Date Taking? Authorizing Provider  acetaminophen (TYLENOL) 325 MG tablet Take 2 tablets (650 mg total) by mouth every 6 (six) hours as needed for mild pain (or Fever >/= 101). 11/09/18  Yes Ainsley Spinner, PA-C  amLODipine (NORVASC) 5 MG tablet Take 5 mg by mouth daily.   Yes [provider]  apixaban (ELIQUIS) 5 MG TABS tablet Take 1 tablet (5 mg total) by mouth 2 (two) times daily. 11/10/18 02/05/19 Yes Spongberg, Audie Pinto, MD  aspirin EC 81 MG tablet Take 81 mg by mouth daily.   Yes [provider]  bisacodyl (DULCOLAX) 10 MG suppository Place 1 suppository (10 mg total) rectally daily as needed for moderate constipation. 11/10/18  Yes Spongberg, Audie Pinto, MD  calcium citrate (CALCITRATE - DOSED IN MG ELEMENTAL CALCIUM) 950 MG tablet Take 1 tablet (200 mg of elemental calcium total) by mouth 2 (two) times daily. 11/04/18  Yes Ainsley Spinner, PA-C  carbidopa-levodopa (SINEMET IR) 25-100 MG tablet Take 1 tablet by mouth 3 (three) times daily.   Yes [provider]  carboxymethylcellulose (REFRESH TEARS) 0.5 % SOLN Place 1 drop into both eyes 4 (four) times daily.   Yes [provider]  cetirizine (ZYRTEC) 10 MG tablet Take 10 mg by mouth daily.   Yes [provider]  cholecalciferol (VITAMIN D) 25 MCG tablet Take 2 tablets (2,000 Units total) by mouth 2 (two) times daily. 11/04/18  Yes Ainsley Spinner, PA-C  citalopram (CELEXA) 40 MG tablet Take 40 mg by mouth daily.   Yes [provider]  clonazePAM (KLONOPIN) 1 MG tablet Take 1 tablet (1 mg total) by mouth at bedtime. 06/11/17  Yes Ghimire, Henreitta Leber, MD  CRANBERRY PO  Take 1 tablet by mouth 2 (two) times daily.   Yes [provider]  dicyclomine (BENTYL) 10 MG capsule Take 10 mg by mouth 4 (four) times daily - after meals and at bedtime.   Yes [provider]  docusate sodium (COLACE) 100 MG capsule Take 1 capsule (100 mg total) by mouth 2 (two) times daily. 11/10/18  Yes Spongberg, Audie Pinto, MD  ferrous sulfate 325 (65 FE) MG tablet Take 1 tablet (325 mg total) by mouth daily with breakfast. 11/11/18  Yes Spongberg, Audie Pinto, MD  gabapentin (NEURONTIN) 800 MG tablet Take 800 mg by mouth 3 (three) times daily.   Yes [provider]  glimepiride (AMARYL) 1 MG tablet Take 1 mg by mouth daily. 06/04/18  Yes [provider]  hydrOXYzine (ATARAX/VISTARIL) 25 MG tablet Take 25 mg by mouth at bedtime.   Yes [provider]  metoprolol tartrate (LOPRESSOR) 25 MG tablet Take 0.5 tablets (12.5 mg total) by mouth 2 (two) times daily. 11/10/18  Yes Spongberg, Audie Pinto, MD  Omega-3 Fatty Acids (FISH OIL) 1000 MG CPDR Take 1,000 mg by mouth daily.   Yes [provider]  ondansetron (ZOFRAN-ODT) 4 MG disintegrating tablet Take 1 tablet (4 mg total) by mouth every 8 (eight) hours as needed for nausea or vomiting. 11/10/18  Yes Spongberg, Audie Pinto, MD  pantoprazole (PROTONIX) 40 MG tablet Take 40 mg by mouth daily.   Yes [provider]  primidone (MYSOLINE) 250 MG tablet Take 250 mg by mouth 2 (two) times daily.   Yes [provider]  rosuvastatin (CRESTOR) 5 MG tablet Take 5 mg by mouth every evening. 01/26/19  Yes [provider]  traMADol (ULTRAM) 50 MG tablet Take 50 mg by mouth every 6 (six) hours as needed for moderate pain.   Yes [provider]  vitamin C (VITAMIN C) 500 MG tablet Take 1 tablet (500 mg total) by mouth daily. Patient taking differently: Take 500 mg by mouth 2 (two) times daily.  11/05/18  Yes Ainsley Spinner, PA-C  Vitamin D, Ergocalciferol, (DRISDOL)  1.25 MG (50000 UT) CAPS capsule Take 1 capsule (50,000 Units total) by mouth every 7 (seven) days. 11/11/18  Yes Ainsley Spinner, PA-C  Zinc Sulfate 220 (50 Zn) MG TABS Take 1 tablet by mouth daily.   Yes [provider]  azithromycin (ZITHROMAX) 250 MG tablet Take 250 mg by mouth daily.    [provider]  Cholecalciferol (VITAMIN D-3) 125 MCG (5000 UT) TABS Take 1 tablet by mouth daily.    [provider]  dexamethasone (DECADRON) 6 MG tablet Take 6 mg by mouth daily.    [provider]    Physical Exam: Vitals:   02/05/19 1545 02/05/19 1600 02/05/19 1615 02/05/19 1630  BP: (!) 114/49 (!) 113/46 (!) 116/45 (!) 117/43  Pulse: (!) 56 (!) 55 (!) 55 (!) 53  Resp: 19 18 16 19   Temp:      TempSrc:  SpO2: 98% 98% 99% 98%    Constitutional: NAD, calm, comfortable Vitals:   02/05/19 1545 02/05/19 1600 02/05/19 1615 02/05/19 1630  BP: (!) 114/49 (!) 113/46 (!) 116/45 (!) 117/43  Pulse: (!) 56 (!) 55 (!) 55 (!) 53  Resp: 19 18 16 19   Temp:      TempSrc:      SpO2: 98% 98% 99% 98%   Eyes: PERRL, lids and conjunctivae normal ENMT: Mucous membranes are moist. Posterior pharynx clear of any exudate or lesions.Normal dentition.  Neck: normal, supple, no masses, no thyromegaly Respiratory: clear to auscultation bilaterally, no wheezing, no crackles. Normal respiratory effort. No accessory muscle use.  Cardiovascular: Regular rate and rhythm, no murmurs / rubs / gallops. No extremity edema. 2+ pedal pulses. No carotid bruits.  Abdomen: no tenderness, no masses palpated. No hepatosplenomegaly. Bowel sounds positive.  Musculoskeletal: no clubbing / cyanosis. No joint deformity upper and lower extremities. Good ROM, no contractures. Normal muscle tone.  Skin: no rashes, lesions, ulcers. No induration Neurologic: CN 2-12 grossly intact. Sensation intact, DTR normal. Strength 5/5 in all 4.  Psychiatric: Normal judgment and insight. Alert and oriented x 3. Normal  mood.   Labs on Admission: I have personally reviewed following labs and imaging studies  CBC: Recent Labs  Lab 02/05/19 1303  WBC 3.3*  NEUTROABS 2.0  HGB 11.9*  HCT 37.9  MCV 100.5*  PLT [id number]*   Basic Metabolic Panel: Recent Labs  Lab 02/05/19 1303 02/05/19 1519  NA 140  --   K 3.6  --   CL 105  --   CO2 26  --   GLUCOSE 142*  --   BUN 23  --   CREATININE 1.15*  --   CALCIUM 8.6*  --   MG  --  1.8   GFR: CrCl cannot be calculated (Unknown ideal weight.). Liver Function Tests: Recent Labs  Lab 02/05/19 1303  AST 16  ALT 6  ALKPHOS 106  BILITOT 0.5  PROT 6.8  ALBUMIN 3.3*   Recent Labs  Lab 02/05/19 1303  LIPASE 24   No results for input(s): AMMONIA in the last 168 hours. Coagulation Profile: No results for input(s): INR, PROTIME in the last 168 hours. Cardiac Enzymes: No results for input(s): CKTOTAL, CKMB, CKMBINDEX, TROPONINI in the last 168 hours. BNP (last 3 results) No results for input(s): PROBNP in the last 8760 hours. HbA1C: No results for input(s): HGBA1C in the last 72 hours. CBG: No results for input(s): GLUCAP in the last 168 hours. Lipid Profile: No results for input(s): CHOL, HDL, LDLCALC, TRIG, CHOLHDL, LDLDIRECT in the last 72 hours. Thyroid Function Tests: Recent Labs    02/05/19 1303  TSH 0.182*   Anemia Panel: No results for input(s): VITAMINB12, FOLATE, FERRITIN, TIBC, IRON, RETICCTPCT in the last 72 hours. Urine analysis:    Component Value Date/Time   COLORURINE YELLOW 11/02/2018 0558   APPEARANCEUR CLEAR 11/02/2018 0558   LABSPEC 1.017 11/02/2018 0558   PHURINE 5.0 11/02/2018 0558   GLUCOSEU NEGATIVE 11/02/2018 0558   HGBUR SMALL (A) 11/02/2018 0558   BILIRUBINUR NEGATIVE 11/02/2018 0558   KETONESUR 80 (A) 11/02/2018 0558   PROTEINUR 30 (A) 11/02/2018 0558   NITRITE NEGATIVE 11/02/2018 0558   LEUKOCYTESUR MODERATE (A) 11/02/2018 0558    Radiological Exams on Admission: DG Chest Portable 1 View  Result Date:  02/05/2019 CLINICAL DATA:  Cough and hypoxia. COVID positive. EXAM: PORTABLE CHEST 1 VIEW COMPARISON:  Chest radiograph dated 06/07/2017 FINDINGS: The heart size  is within normal limits. Vascular calcifications are seen in the aortic arch. Both lungs are clear. The visualized skeletal structures are unremarkable. IMPRESSION: No active cardiopulmonary disease. Aortic Atherosclerosis (ICD10-I70.0). Electronically Signed   By: Zerita Boers M.D.   On: 02/05/2019 13:26    EKG: Independently reviewed.  Sinus bradycardia  Assessment/Plan Active Problems:   COVID-19 virus infection  1.  COVID-19 infection with hypoxia: Patient will benefit from remdesivir and dexamethasone.  She did not have pneumonia on chest x-ray.  Daily monitor inflammatory markers.  Vitamin supplementation and supplemental oxygen will be given.  We will consider CTA of the chest based on D-dimer result  2.  Hypoxia: Patient reportedly was hypoxic at her skilled nursing facility.  Oxygen saturation on 2 L currently 99%.  Continue supplemental oxygen and monitor closely  3. Hypertension: Monitor blood pressure profile.  Continue  4.  Type 2 diabetes: Dietary management.  Monitor blood glucose and insulin sliding scale coverage.  5.  Paroxysmal atrial fibrillation: Continue anticoagulation with Eliquis.  No AV nodal blocking agents due to bradycardia.  I am holding  6.  Parkinson disease: Continue Sinemet  7.  Hyperlipidemia: Continue patient on statins  8.  Cognitive impairment versus dementia versus delirium: Monitor for behavioral changes  9.  Sinus bradycardia: I am withholding metoprolol for now.  Monitor heart rhythm closely  10.  Renal massES concerning for renal cell carcinoma: I will discuss with her daughter regarding urology follow-up.  Patient is uncertain about this diagnosis and how she wants to approach management.  DVT prophylaxis: Eliquis  Code Status: DNR Family Communication: Discussed with patient's  daughter [name] on [phone] Disposition Plan: Anticipated discharge back to SNF in 3 to 4 days Consults called: None Admission status: Inpatient telemetry   Phineas Semen MD [facility] Pager [phone]  If 7PM-7AM, please contact night-coverage [email or web address] Password [facility]  02/05/2019, 4:42 PM

## 2019-02-05 NOTE — ED Triage Notes (Signed)
Pt sent here from [place] care by EMS for hypoxia. Pt tested positive for COVID. Pt was 92% on room air when EMS arrived, staff reports pt desat to mid 80s on room air. Pt wearing 2L [place] maintaining sats 98-100%. Pt febrile en route 102.6, ems gave 1G tylenol. Pt denies SOB or CP, pt a.o.

## 2019-02-05 NOTE — ED Notes (Signed)
ED TO INPATIENT HANDOFF REPORT  ED Nurse Name and Phone #: William Hamburger, RN [phone]  S Name/Age/Gender [name] 85 y.o. female Room/Bed: 018C/018C  Code Status   Code Status: Prior  Home/SNF/Other Home Patient oriented to: self, place, time and situation Is this baseline? No   Triage Complete: Triage complete  Chief Complaint COVID-19 virus infection [U07.1]  Triage Note Pt sent here from [facility] care by EMS for hypoxia. Pt tested positive for COVID. Pt was 92% on room air when EMS arrived, staff reports pt desat to mid 80s on room air. Pt wearing 2L [place] maintaining sats 98-100%. Pt febrile en route 102.6, ems gave 1G tylenol. Pt denies SOB or CP, pt a.o.    Allergies Allergies  Allergen Reactions  . Statins Other (See Comments)    Level of Care/Admitting Diagnosis ED Disposition    ED Disposition Condition Comment   Admit  Hospital Area: [facility] [[id number]]  Level of Care: Telemetry Medical [104]  Covid Evaluation: Confirmed COVID Positive  Diagnosis: COVID-19 virus infection [[id number]]  Admitting Physician: Phineas Semen [[id number]]  Attending Physician: Phineas Semen [id number]  Estimated length of stay: 3 - 4 days  Certification:: I certify this patient will need inpatient services for at least 2 midnights       B Medical/Surgery History Past Medical History:  Diagnosis Date  . Diabetes ([place])   . Essential tremor   . Fall 06/07/2017   rib fractures  . GERD (gastroesophageal reflux disease)   . Hypertension   . Peripheral neuropathy    Past Surgical History:  Procedure Laterality Date  . ABDOMINAL HYSTERECTOMY  1975  . BREAST LUMPECTOMY Right    non cancerous  . FEMUR IM NAIL Right 11/01/2018   Procedure: INTRAMEDULLARY (IM) RETROGRADE FEMORAL NAILING;  Surgeon: Altamese Pike Road, MD;  Location: [facility];  Service: Orthopedics;  Laterality: Right;     A IV Location/Drains/Wounds Patient Lines/Drains/Airways Status    Active Line/Drains/Airways    Name:   Placement date:   Placement time:   Site:   Days:   Peripheral IV 02/05/19 Left Forearm   02/05/19    1300    Forearm   less than 1   External Urinary Catheter   10/31/18    1641    --   97   External Urinary Catheter   02/05/19    1301    --   less than 1   Incision (Closed) 11/01/18 Knee Right   11/01/18    1614     96          Intake/Output Last 24 hours No intake or output data in the 24 hours ending 02/05/19 1641  Labs/Imaging Results for orders placed or performed during the hospital encounter of 02/05/19 (from the past 48 hour(s))  CBC with Differential     Status: Abnormal   Collection Time: 02/05/19  1:03 PM  Result Value Ref Range   WBC 3.3 (L) 4.0 - 10.5 K/uL   RBC 3.77 (L) 3.87 - 5.11 MIL/uL   Hemoglobin 11.9 (L) 12.0 - 15.0 g/dL   HCT 37.9 36.0 - 46.0 %   MCV 100.5 (H) 80.0 - 100.0 fL   MCH 31.6 26.0 - 34.0 pg   MCHC 31.4 30.0 - 36.0 g/dL   RDW 15.3 11.5 - 15.5 %   Platelets 113 (L) 150 - 400 K/uL    Comment: REPEATED TO VERIFY PLATELET COUNT CONFIRMED BY SMEAR SPECIMEN CHECKED FOR CLOTS  Immature Platelet Fraction may be clinically indicated, consider ordering this additional test [id number]    nRBC 0.0 0.0 - 0.2 %   Neutrophils Relative % 60 %   Neutro Abs 2.0 1.7 - 7.7 K/uL   Lymphocytes Relative 26 %   Lymphs Abs 0.9 0.7 - 4.0 K/uL   Monocytes Relative 14 %   Monocytes Absolute 0.5 0.1 - 1.0 K/uL   Eosinophils Relative 0 %   Eosinophils Absolute 0.0 0.0 - 0.5 K/uL   Basophils Relative 0 %   Basophils Absolute 0.0 0.0 - 0.1 K/uL   Immature Granulocytes 0 %   Abs Immature Granulocytes 0.01 0.00 - 0.07 K/uL    Comment: Performed at [facility] [address] [place], [place] [address]  Comprehensive metabolic panel     Status: Abnormal   Collection Time: 02/05/19  1:03 PM  Result Value Ref Range   Sodium 140 135 - 145 mmol/L   Potassium 3.6 3.5 - 5.1 mmol/L   Chloride 105 98 - 111 mmol/L   CO2 26 22 -  32 mmol/L   Glucose, Bld 142 (H) 70 - 99 mg/dL   BUN 23 8 - 23 mg/dL   Creatinine, Ser 1.15 (H) 0.44 - 1.00 mg/dL   Calcium 8.6 (L) 8.9 - 10.3 mg/dL   Total Protein 6.8 6.5 - 8.1 g/dL   Albumin 3.3 (L) 3.5 - 5.0 g/dL   AST 16 15 - 41 U/L   ALT 6 0 - 44 U/L   Alkaline Phosphatase 106 38 - 126 U/L   Total Bilirubin 0.5 0.3 - 1.2 mg/dL   GFR calc non Af Amer 44 (L) >60 mL/min   GFR calc Af Amer 51 (L) >60 mL/min   Anion gap 9 5 - 15    Comment: Performed at [facility] [address] [place], [place] [address]  Lipase, blood     Status: None   Collection Time: 02/05/19  1:03 PM  Result Value Ref Range   Lipase 24 11 - 51 U/L    Comment: Performed at [facility] [address], [place], [place] [address]  TSH     Status: Abnormal   Collection Time: 02/05/19  1:03 PM  Result Value Ref Range   TSH 0.182 (L) 0.350 - 4.500 uIU/mL    Comment: Performed by a 3rd Generation assay with a functional sensitivity of <=0.01 uIU/mL. Performed at [facility], [place] [address], [place], [place] [address]   Troponin I (High Sensitivity)     Status: None   Collection Time: 02/05/19  1:03 PM  Result Value Ref Range   Troponin I (High Sensitivity) 16 <18 ng/L    Comment: (NOTE) Elevated high sensitivity troponin I (hsTnI) values and significant  changes across serial measurements may suggest ACS but many other  chronic and acute conditions are known to elevate hsTnI results.  Refer to the "Links" section for chest pain algorithms and additional  guidance. Performed at [facility], [place] [address] [place], [place] [address]   Lactic acid, plasma     Status: None   Collection Time: 02/05/19  1:05 PM  Result Value Ref Range   Lactic Acid, Venous 0.8 0.5 - 1.9 mmol/L    Comment: Performed at [facility] [address] [place], [place] [address]  POC SARS Coronavirus 2 Ag-ED - Nasal Swab (BD Veritor Kit)     Status: Abnormal   Collection Time: 02/05/19  1:36 PM   Result Value Ref Range   SARS Coronavirus 2 Ag POSITIVE (A) NEGATIVE    Comment: (NOTE) SARS-CoV-2 antigen PRESENT. Positive results indicate the presence of viral antigens, but clinical correlation with patient history and other diagnostic information is necessary to determine patient infection status.  Positive results do not rule out bacterial infection or co-infection  with other viruses. False positive results are rare but can occur, and confirmatory RT-PCR testing may be appropriate in some circumstances. The expected result is Negative. Fact Sheet for Patients: PodPark.tn Fact Sheet for Providers: GiftContent.is  This test is not yet approved or cleared by the [place] FDA and  has been authorized for detection and/or diagnosis of SARS-CoV-2 by FDA under an Emergency Use Authorization (EUA).  This EUA will remain in effect (meaning this test can be used) for the duration of  the COVID-19 declaration under Section 564(b)(1) of the Act, 21 U.S.C. section 360bbb-3(b)(1), unless the a uthorization is terminated or revoked sooner.   Troponin I (High Sensitivity)     Status: None   Collection Time: 02/05/19  3:19 PM  Result Value Ref Range   Troponin I (High Sensitivity) 15 <18 ng/L    Comment: (NOTE) Elevated high sensitivity troponin I (hsTnI) values and significant  changes across serial measurements may suggest ACS but many other  chronic and acute conditions are known to elevate hsTnI results.  Refer to the "Links" section for chest pain algorithms and additional  guidance. Performed at [facility], [place] [address], [place], [place] [address]   Magnesium     Status: None   Collection Time: 02/05/19  3:19 PM  Result Value Ref Range   Magnesium 1.8 1.7 - 2.4 mg/dL    Comment: Performed at [facility] [address] [place], [place] [address]   DG Chest Portable 1 View  Result Date:  02/05/2019 CLINICAL DATA:  Cough and hypoxia. COVID positive. EXAM: PORTABLE CHEST 1 VIEW COMPARISON:  Chest radiograph dated 06/07/2017 FINDINGS: The heart size is within normal limits. Vascular calcifications are seen in the aortic arch. Both lungs are clear. The visualized skeletal structures are unremarkable. IMPRESSION: No active cardiopulmonary disease. Aortic Atherosclerosis (ICD10-I70.0). Electronically Signed   By: Zerita Boers M.D.   On: 02/05/2019 13:26    Pending Labs Unresulted Labs (From admission, onward)    Start     Ordered   02/05/19 1259  Blood culture (routine x 2)  BLOOD CULTURE X 2,   STAT     02/05/19 1303   02/05/19 1258  Urinalysis, Routine w reflex microscopic  Once,   STAT     02/05/19 1303   02/05/19 1258  Urine culture  ONCE - STAT,   STAT     02/05/19 1303          Vitals/Pain Today's Vitals   02/05/19 1545 02/05/19 1600 02/05/19 1615 02/05/19 1630  BP: (!) 114/49 (!) 113/46 (!) 116/45 (!) 117/43  Pulse: (!) 56 (!) 55 (!) 55 (!) 53  Resp: 19 18 16 19   Temp:      TempSrc:      SpO2: 98% 98% 99% 98%  PainSc:        Isolation Precautions Airborne and Contact precautions  Medications Medications  sodium chloride 0.9 % bolus 500 mL (0 mLs Intravenous Stopped 02/05/19 1635)    Mobility walks with person assist Moderate fall risk   Focused Assessments Pulmonary Assessment Handoff:  Lung sounds:   O2  Device: Nasal Cannula O2 Flow Rate (L/min): 2 L/min      R Recommendations: See Admitting Provider Note  Report given to:   Additional Notes:

## 2019-02-05 NOTE — ED Provider Notes (Signed)
[facility] EMERGENCY DEPARTMENT Provider Note   CSN: [id number] Arrival date & time: 02/05/19  1236     History Chief Complaint  Patient presents with  . COVID+  . Hypoxia    Kristen Mason is a 85 y.o. female.  The history is provided by the patient and medical records. No language interpreter was used.  Cough Cough characteristics:  Productive Sputum characteristics:  Nondescript Severity:  Moderate Onset quality:  Gradual Duration:  3 days Progression:  Waxing and waning Chronicity:  New Relieved by:  Nothing Worsened by:  Nothing Ineffective treatments:  None tried Associated symptoms: chills, fever and shortness of breath (mild earlier before oxygen)   Associated symptoms: no chest pain, no diaphoresis, no headaches, no rash and no wheezing   Risk factors: recent infection (covid)        Past Medical History:  Diagnosis Date  . Diabetes ([place])   . Essential tremor   . Fall 06/07/2017   rib fractures  . GERD (gastroesophageal reflux disease)   . Hypertension   . Peripheral neuropathy     Patient Active Problem List   Diagnosis Date Noted  . Abdominal pain   . Femur fracture, right ([place]) 10/31/2018  . Acute on chronic kidney failure ([place]) 10/31/2018  . Macrocytic anemia 10/31/2018  . Falls 07/24/2017  . COPD exacerbation ([place]) 06/16/2017  . Parkinson's disease ([place]) 06/16/2017  . Essential tremor 06/16/2017  . Peripheral neuropathy 06/16/2017  . Depression 06/16/2017  . Anxiety 06/16/2017  . Acute respiratory failure with hypoxia ([place]) 06/07/2017  . Fall at home, initial encounter 06/07/2017  . Multiple rib fractures 06/07/2017  . Controlled diabetes mellitus ([place]) 06/07/2017  . Essential hypertension 06/07/2017  . Tobacco dependence 06/07/2017    Past Surgical History:  Procedure Laterality Date  . ABDOMINAL HYSTERECTOMY  1975  . BREAST LUMPECTOMY Right    non cancerous  . FEMUR IM NAIL Right 11/01/2018   Procedure:  INTRAMEDULLARY (IM) RETROGRADE FEMORAL NAILING;  Surgeon: Altamese Olde [place], MD;  Location: [facility];  Service: Orthopedics;  Laterality: Right;     OB History   No obstetric history on file.     No family history on file.  Social History   Tobacco Use  . Smoking status: Former Smoker    Types: Cigarettes  . Smokeless tobacco: Former Network engineer Use Topics  . Alcohol use: No    Alcohol/week: 0.0 standard drinks  . Drug use: No    Home Medications Prior to Admission medications   Medication Sig Start Date End Date Taking? Authorizing Provider  acetaminophen (TYLENOL) 325 MG tablet Take 2 tablets (650 mg total) by mouth every 6 (six) hours as needed for mild pain (or Fever >/= 101). 11/09/18   Ainsley Spinner, PA-C  amLODipine (NORVASC) 5 MG tablet Take 5 mg by mouth daily.    [provider]  apixaban (ELIQUIS) 5 MG TABS tablet Take 1 tablet (5 mg total) by mouth 2 (two) times daily. 11/10/18 12/10/18  Spongberg, Audie Pinto, MD  bisacodyl (DULCOLAX) 10 MG suppository Place 1 suppository (10 mg total) rectally daily as needed for moderate constipation. 11/10/18   Spongberg, Audie Pinto, MD  calcium citrate (CALCITRATE - DOSED IN MG ELEMENTAL CALCIUM) 950 MG tablet Take 1 tablet (200 mg of elemental calcium total) by mouth 2 (two) times daily. 11/04/18   Ainsley Spinner, PA-C  carbidopa-levodopa (SINEMET IR) 25-100 MG tablet Take 1 tablet by mouth 3 (three) times daily.    [provider]  cetirizine (ZYRTEC) 10 MG tablet Take 10 mg by mouth daily.    [provider]  cholecalciferol (VITAMIN D) 25 MCG tablet Take 2 tablets (2,000 Units total) by mouth 2 (two) times daily. 11/04/18   Ainsley Spinner, PA-C  citalopram (CELEXA) 40 MG tablet Take 40 mg by mouth daily.    [provider]  clonazePAM (KLONOPIN) 1 MG tablet Take 1 tablet (1 mg total) by mouth at bedtime. 06/11/17   Ghimire, Henreitta Leber, MD  dicyclomine (BENTYL) 10 MG capsule Take 10 mg by mouth 4  (four) times daily - after meals and at bedtime.    [provider]  docusate sodium (COLACE) 100 MG capsule Take 1 capsule (100 mg total) by mouth 2 (two) times daily. 11/10/18   Spongberg, Audie Pinto, MD  erythromycin ophthalmic ointment Place into the right eye at bedtime. 11/10/18   Spongberg, Audie Pinto, MD  ferrous sulfate 325 (65 FE) MG tablet Take 1 tablet (325 mg total) by mouth daily with breakfast. 11/11/18   Spongberg, Audie Pinto, MD  gabapentin (NEURONTIN) 800 MG tablet Take 800 mg by mouth 3 (three) times daily.    [provider]  glimepiride (AMARYL) 1 MG tablet Take 1 mg by mouth daily. 06/04/18   [provider]  hydroxypropyl methylcellulose / hypromellose (ISOPTO TEARS / GONIOVISC) 2.5 % ophthalmic solution Place 1 drop into both eyes 4 (four) times daily. 11/10/18   Spongberg, Audie Pinto, MD  hydrOXYzine (ATARAX/VISTARIL) 25 MG tablet Take 25 mg by mouth at bedtime.    [provider]  metoprolol tartrate (LOPRESSOR) 25 MG tablet Take 0.5 tablets (12.5 mg total) by mouth 2 (two) times daily. 11/10/18   Spongberg, Audie Pinto, MD  Omega-3 Fatty Acids (FISH OIL) 1000 MG CPDR Take 1,000 mg by mouth daily.    [provider]  ondansetron (ZOFRAN-ODT) 4 MG disintegrating tablet Take 1 tablet (4 mg total) by mouth every 8 (eight) hours as needed for nausea or vomiting. 11/10/18   Spongberg, Audie Pinto, MD  pantoprazole (PROTONIX) 40 MG tablet Take 40 mg by mouth daily.    [provider]  primidone (MYSOLINE) 250 MG tablet Take 250 mg by mouth 2 (two) times daily.    [provider]  simvastatin (ZOCOR) 10 MG tablet Take 10 mg by mouth daily. 05/28/18   [provider]  vitamin C (VITAMIN C) 500 MG tablet Take 1 tablet (500 mg total) by mouth daily. 11/05/18   Ainsley Spinner, PA-C  Vitamin D, Ergocalciferol, (DRISDOL) 1.25 MG (50000 UT) CAPS capsule Take 1 capsule (50,000 Units total) by mouth every 7  (seven) days. 11/11/18   Ainsley Spinner, PA-C    Allergies    Statins  Review of Systems   Review of Systems  Constitutional: Positive for chills, fatigue and fever. Negative for diaphoresis.  HENT: Negative for congestion.   Respiratory: Positive for cough and shortness of breath (mild earlier before oxygen). Negative for chest tightness, wheezing and stridor.   Cardiovascular: Negative for chest pain, palpitations and leg swelling.  Gastrointestinal: Positive for diarrhea. Negative for abdominal pain, constipation, nausea and vomiting.  Genitourinary: Positive for dysuria.  Musculoskeletal: Negative for back pain, neck pain and neck stiffness.  Skin: Negative for rash and wound.  Neurological: Negative for dizziness, light-headedness and headaches.  Psychiatric/Behavioral: Negative for agitation.  All other systems reviewed and are negative.   Physical Exam Updated Vital Signs BP (!) 126/47 Comment: Simultaneous filing. User may not have seen  previous data.  Pulse (!) 51 Comment: Simultaneous filing. User may not have seen previous data.  Temp (!) 100.9 F (38.3 C) (Rectal)   Resp (!) 22 Comment: Simultaneous filing. User may not have seen previous data.  SpO2 99% Comment: Simultaneous filing. User may not have seen previous data.  Physical Exam Vitals and nursing note reviewed.  Constitutional:      General: She is not in acute distress.    Appearance: She is well-developed. She is not ill-appearing, toxic-appearing or diaphoretic.  HENT:     Head: Normocephalic and atraumatic.     Right Ear: External ear normal.     Left Ear: External ear normal.     Nose: Nose normal.     Mouth/Throat:     Mouth: Mucous membranes are moist.     Pharynx: No oropharyngeal exudate or posterior oropharyngeal erythema.  Eyes:     Extraocular Movements: Extraocular movements intact.     Conjunctiva/sclera: Conjunctivae normal.     Pupils: Pupils are equal, round, and reactive to light.    Cardiovascular:     Rate and Rhythm: Bradycardia present.     Pulses: Normal pulses.     Heart sounds: Murmur present.  Pulmonary:     Effort: No respiratory distress.     Breath sounds: No stridor. Rhonchi present. No wheezing or rales.  Chest:     Chest wall: No tenderness.  Abdominal:     General: Abdomen is flat. There is no distension.     Tenderness: There is no abdominal tenderness. There is no right CVA tenderness, left CVA tenderness or rebound.  Musculoskeletal:        General: No tenderness.     Cervical back: Normal range of motion and neck supple.  Skin:    General: Skin is warm.     Capillary Refill: Capillary refill takes less than 2 seconds.     Findings: No erythema or rash.  Neurological:     General: No focal deficit present.     Mental Status: She is alert.     Motor: No abnormal muscle tone.     Deep Tendon Reflexes: Reflexes are normal and symmetric.  Psychiatric:        Mood and Affect: Mood normal.     ED Results / Procedures / Treatments   Labs (all labs ordered are listed, but only abnormal results are displayed) Labs Reviewed  CBC WITH DIFFERENTIAL/PLATELET - Abnormal; Notable for the following components:      Result Value   WBC 3.3 (*)    RBC 3.77 (*)    Hemoglobin 11.9 (*)    MCV 100.5 (*)    Platelets 113 (*)    All other components within normal limits  COMPREHENSIVE METABOLIC PANEL - Abnormal; Notable for the following components:   Glucose, Bld 142 (*)    Creatinine, Ser 1.15 (*)    Calcium 8.6 (*)    Albumin 3.3 (*)    GFR calc non Af Amer 44 (*)    GFR calc Af Amer 51 (*)    All other components within normal limits  TSH - Abnormal; Notable for the following components:   TSH 0.182 (*)    All other components within normal limits  POC SARS CORONAVIRUS 2 AG -  ED - Abnormal; Notable for the following components:   SARS Coronavirus 2 Ag POSITIVE (*)    All other components within normal limits  URINE CULTURE  CULTURE, BLOOD  (ROUTINE X 2)  CULTURE, BLOOD (ROUTINE X 2)  LIPASE, BLOOD  LACTIC ACID, PLASMA  URINALYSIS, ROUTINE W REFLEX MICROSCOPIC  MAGNESIUM  TROPONIN I (HIGH SENSITIVITY)  TROPONIN I (HIGH SENSITIVITY)    EKG EKG Interpretation  Date/Time:  Sunday February 05 2019 12:45:01 EST Ventricular Rate:  53 PR Interval:    QRS Duration: 95 QT Interval:  479 QTC Calculation: 450 R Axis:   16 Text Interpretation: Sinus rhythm Bradycardia When compared to prior, slower rate. possible u wave. No STEMI Confirmed by Antony Blackbird [phone]) on 02/05/2019 12:54:32 PM   Radiology DG Chest Portable 1 View  Result Date: 02/05/2019 CLINICAL DATA:  Cough and hypoxia. COVID positive. EXAM: PORTABLE CHEST 1 VIEW COMPARISON:  Chest radiograph dated 06/07/2017 FINDINGS: The heart size is within normal limits. Vascular calcifications are seen in the aortic arch. Both lungs are clear. The visualized skeletal structures are unremarkable. IMPRESSION: No active cardiopulmonary disease. Aortic Atherosclerosis (ICD10-I70.0). Electronically Signed   By: Zerita Boers M.D.   On: 02/05/2019 13:26    Procedures Procedures (including critical care time)  Kristen Mason was evaluated in Emergency Department on 02/05/2019 for the symptoms described in the history of present illness. She was evaluated in the context of the global COVID-19 pandemic, which necessitated consideration that the patient might be at risk for infection with the SARS-CoV-2 virus that causes COVID-19. Institutional protocols and algorithms that pertain to the evaluation of patients at risk for COVID-19 are in a state of rapid change based on information released by regulatory bodies including the CDC and federal and state organizations. These policies and algorithms were followed during the patient's care in the ED.   Medications Ordered in ED Medications - No data to display  ED Course  I have reviewed the triage vital signs and the nursing  notes.  Pertinent labs & imaging results that were available during my care of the patient were reviewed by me and considered in my medical decision making (see chart for details).    MDM Rules/Calculators/A&P                      Kristen Mason is a 85 y.o. female with a past medical history significant for hypertension, diabetes, COPD, Parkinson's disease, prior femur fracture, essential tremor, anxiety, depression, and recent Covid diagnosis who presents for cough, fatigue, and hypoxia at a facility.  Patient reports that she has had some cough but denies current shortness of breath.  She was found to have oxygen saturation "in the 80s" on room air at the facility.  EMS placed her on 2 L nasal cannula with improvement in her oxygen saturations to the mid 90s.  She was febrile with EMS up to 102.6 and they gave Tylenol.  She currently denies chest pain or palpitations.  She reports feeling fatigued.  She does report she is having dysuria which is new and she has had UTIs in the past.  She denies any constipation but does report diarrhea.  She denies any new trauma.  She denies any headache neck pain or neck stiffness.  No nausea or vomiting.  No abdominal pain.  On exam, lungs have some coarseness.  Chest and abdomen are nontender.  She is tachypneic and febrile but is somewhat bradycardic.  She is on 2 L nasal cannula oxygen to maintain her sats.  Exam otherwise unremarkable.  Clinically I suspect her hypoxia is due to COVID-19 infection however will get work-up to look for other etiologies or pneumonia as well.  We will get labs.  Due to the dysuria, will get urinalysis and culture.  Due to her new hypoxia where she does not take oxygen normally, anticipate admission for Covid with hypoxia.      2:33 PM Patient's work-up again returned.  We confirmed her Covid status is positive.  Lactic acid not elevated.  She has mild anemia.  Metabolic panel showed grossly unremarkable electrolytes and kidney  function of 1.15.  Lipase not elevated.  TSH low.  Troponin not elevated.  Chest x-ray not show pneumonia.  Suspect hypoxia is due to her Covid infection.  As she is now on oxygen and blood pressures are on the softer side, will provide some fluids with her diarrhea and likely dehydration and will call for admission.  Will add on magnesium given the bradycardia she is experiencing.  The only thing she is still awaiting is the magnesium level and urinalysis.  She will be admitted for the hypoxia.   Final Clinical Impression(s) / ED Diagnoses Final diagnoses:  COVID-19  Hypoxia  Cough    Clinical Impression: 1. COVID-19   2. Hypoxia   3. Cough     Disposition: Admit  This note was prepared with assistance of Dragon voice recognition software. Occasional wrong-word or sound-a-like substitutions may have occurred due to the inherent limitations of voice recognition software.     Syla Devoss, Gwenyth Allegra, MD 02/05/19 1538

## 2019-02-06 ENCOUNTER — Encounter (HOSPITAL_COMMUNITY): Payer: Self-pay | Admitting: Internal Medicine

## 2019-02-06 DIAGNOSIS — U071 COVID-19: Secondary | ICD-10-CM | POA: Diagnosis present

## 2019-02-06 LAB — GLUCOSE, CAPILLARY
Glucose-Capillary: 82 mg/dL (ref 70–99)
Glucose-Capillary: 136 mg/dL — ABNORMAL HIGH (ref 70–99)
Glucose-Capillary: 94 mg/dL (ref 70–99)
Glucose-Capillary: 127 mg/dL — ABNORMAL HIGH (ref 70–99)

## 2019-02-06 LAB — MRSA PCR SCREENING: MRSA by PCR: NEGATIVE

## 2019-02-06 MED ORDER — INSULIN ASPART 100 UNIT/ML ~~LOC~~ SOLN
0.0000 [IU] | Freq: Three times a day (TID) | SUBCUTANEOUS | Status: DC
  Administered 2019-02-07 – 2019-02-08 (×2): 1 [IU] via SUBCUTANEOUS
  Administered 2019-02-09 (×2): 2 [IU] via SUBCUTANEOUS
  Administered 2019-02-10: 1 [IU] via SUBCUTANEOUS
  Administered 2019-02-10: 09:00:00 2 [IU] via SUBCUTANEOUS
  Administered 2019-02-11: 09:00:00 1 [IU] via SUBCUTANEOUS

## 2019-02-06 MED ORDER — GABAPENTIN 400 MG PO CAPS
800.0000 mg | ORAL_CAPSULE | Freq: Three times a day (TID) | ORAL | Status: DC
  Administered 2019-02-06 – 2019-02-11 (×16): 800 mg via ORAL
  Filled 2019-02-06 (×4): qty 2
  Filled 2019-02-06 (×2): qty 8
  Filled 2019-02-06 (×3): qty 2
  Filled 2019-02-06: qty 8
  Filled 2019-02-06 (×3): qty 2
  Filled 2019-02-06 (×2): qty 8
  Filled 2019-02-06 (×4): qty 2

## 2019-02-06 MED ORDER — BISACODYL 10 MG RE SUPP: 10.0000 mg | Freq: Every day | RECTAL | Status: DC | PRN

## 2019-02-06 MED ORDER — CITALOPRAM HYDROBROMIDE 10 MG PO TABS
40.0000 mg | ORAL_TABLET | Freq: Every day | ORAL | Status: DC
  Administered 2019-02-06 – 2019-02-11 (×6): 40 mg via ORAL
  Filled 2019-02-06 (×6): qty 4

## 2019-02-06 MED ORDER — DICYCLOMINE HCL 10 MG PO CAPS
10.0000 mg | ORAL_CAPSULE | Freq: Three times a day (TID) | ORAL | Status: DC
  Administered 2019-02-06 – 2019-02-11 (×21): 10 mg via ORAL
  Filled 2019-02-06 (×28): qty 1

## 2019-02-06 MED ORDER — VITAMIN D 25 MCG (1000 UNIT) PO TABS
2000.0000 [IU] | ORAL_TABLET | Freq: Every day | ORAL | Status: DC
  Administered 2019-02-06 – 2019-02-11 (×6): 2000 [IU] via ORAL
  Filled 2019-02-06 (×6): qty 2

## 2019-02-06 MED ORDER — DOCUSATE SODIUM 100 MG PO CAPS
100.0000 mg | ORAL_CAPSULE | Freq: Two times a day (BID) | ORAL | Status: DC
  Administered 2019-02-06 – 2019-02-11 (×11): 100 mg via ORAL
  Filled 2019-02-06 (×11): qty 1

## 2019-02-06 MED ORDER — ZINC SULFATE 220 (50 ZN) MG PO CAPS
220.0000 mg | ORAL_CAPSULE | Freq: Every day | ORAL | Status: DC
  Administered 2019-02-06 – 2019-02-11 (×6): 220 mg via ORAL
  Filled 2019-02-06 (×6): qty 1

## 2019-02-06 MED ORDER — ORAL CARE MOUTH RINSE
15.0000 mL | Freq: Two times a day (BID) | OROMUCOSAL | Status: DC
  Administered 2019-02-06 – 2019-02-11 (×11): 15 mL via OROMUCOSAL

## 2019-02-06 MED ORDER — PANTOPRAZOLE SODIUM 40 MG PO TBEC
40.0000 mg | DELAYED_RELEASE_TABLET | Freq: Every day | ORAL | Status: DC
  Administered 2019-02-06 – 2019-02-11 (×6): 40 mg via ORAL
  Filled 2019-02-06 (×6): qty 1

## 2019-02-06 MED ORDER — PRIMIDONE 250 MG PO TABS
250.0000 mg | ORAL_TABLET | Freq: Two times a day (BID) | ORAL | Status: DC
  Administered 2019-02-06 – 2019-02-11 (×11): 250 mg via ORAL
  Filled 2019-02-06 (×12): qty 1

## 2019-02-06 MED ORDER — ASPIRIN EC 81 MG PO TBEC
81.0000 mg | DELAYED_RELEASE_TABLET | Freq: Every day | ORAL | Status: DC
  Administered 2019-02-06 – 2019-02-11 (×6): 81 mg via ORAL
  Filled 2019-02-06 (×6): qty 1

## 2019-02-06 MED ORDER — FERROUS SULFATE 325 (65 FE) MG PO TABS
325.0000 mg | ORAL_TABLET | Freq: Every day | ORAL | Status: DC
  Administered 2019-02-06 – 2019-02-11 (×6): 325 mg via ORAL
  Filled 2019-02-06 (×8): qty 1

## 2019-02-06 MED ORDER — TRAMADOL HCL 50 MG PO TABS: 50.0000 mg | ORAL_TABLET | Freq: Four times a day (QID) | ORAL | Status: DC | PRN

## 2019-02-06 MED ORDER — APIXABAN 5 MG PO TABS
5.0000 mg | ORAL_TABLET | Freq: Two times a day (BID) | ORAL | Status: DC
  Administered 2019-02-06 – 2019-02-11 (×11): 5 mg via ORAL
  Filled 2019-02-06 (×13): qty 1

## 2019-02-06 MED ORDER — POLYVINYL ALCOHOL 1.4 % OP SOLN
1.0000 [drp] | Freq: Four times a day (QID) | OPHTHALMIC | Status: DC
  Administered 2019-02-06 – 2019-02-11 (×21): 1 [drp] via OPHTHALMIC
  Filled 2019-02-06: qty 15

## 2019-02-06 MED ORDER — LORATADINE 10 MG PO TABS
10.0000 mg | ORAL_TABLET | Freq: Every day | ORAL | Status: DC
  Administered 2019-02-06 – 2019-02-11 (×6): 10 mg via ORAL
  Filled 2019-02-06 (×6): qty 1

## 2019-02-06 MED ORDER — CLONAZEPAM 0.5 MG PO TABS
1.0000 mg | ORAL_TABLET | Freq: Every day | ORAL | Status: DC
  Administered 2019-02-06 – 2019-02-10 (×5): 1 mg via ORAL
  Filled 2019-02-06 (×5): qty 2

## 2019-02-06 MED ORDER — SODIUM CHLORIDE 0.9 % IV SOLN: 200.0000 mg | Freq: Once | INTRAVENOUS | Status: DC

## 2019-02-06 MED ORDER — ACETAMINOPHEN 325 MG PO TABS
650.0000 mg | ORAL_TABLET | Freq: Four times a day (QID) | ORAL | Status: DC | PRN
  Administered 2019-02-06 – 2019-02-07 (×2): 650 mg via ORAL
  Filled 2019-02-06 (×2): qty 2

## 2019-02-06 MED ORDER — SODIUM CHLORIDE 0.9 % IV SOLN
100.0000 mg | Freq: Every day | INTRAVENOUS | Status: AC
  Administered 2019-02-06 – 2019-02-09 (×4): 100 mg via INTRAVENOUS
  Filled 2019-02-06 (×4): qty 20

## 2019-02-06 MED ORDER — ALBUTEROL SULFATE HFA 108 (90 BASE) MCG/ACT IN AERS
2.0000 | INHALATION_SPRAY | Freq: Four times a day (QID) | RESPIRATORY_TRACT | Status: DC
  Administered 2019-02-06 – 2019-02-11 (×22): 2 via RESPIRATORY_TRACT
  Filled 2019-02-06 (×2): qty 6.7

## 2019-02-06 MED ORDER — SODIUM CHLORIDE 0.9 % IV SOLN: 100.0000 mg | Freq: Every day | INTRAVENOUS | Status: DC

## 2019-02-06 MED ORDER — AMLODIPINE BESYLATE 5 MG PO TABS
5.0000 mg | ORAL_TABLET | Freq: Every day | ORAL | Status: DC
  Administered 2019-02-06 – 2019-02-11 (×6): 5 mg via ORAL
  Filled 2019-02-06 (×6): qty 1

## 2019-02-06 MED ORDER — GUAIFENESIN-DM 100-10 MG/5ML PO SYRP
10.0000 mL | ORAL_SOLUTION | ORAL | Status: DC | PRN
  Administered 2019-02-07 – 2019-02-08 (×2): 10 mL via ORAL
  Filled 2019-02-06 (×2): qty 10

## 2019-02-06 MED ORDER — DEXAMETHASONE SODIUM PHOSPHATE 10 MG/ML IJ SOLN
6.0000 mg | INTRAMUSCULAR | Status: DC
  Administered 2019-02-07 – 2019-02-11 (×5): 6 mg via INTRAVENOUS
  Filled 2019-02-06 (×5): qty 1

## 2019-02-06 MED ORDER — HYDROXYZINE HCL 25 MG PO TABS
25.0000 mg | ORAL_TABLET | Freq: Every day | ORAL | Status: DC
  Administered 2019-02-06 – 2019-02-10 (×5): 25 mg via ORAL
  Filled 2019-02-06 (×6): qty 1

## 2019-02-06 MED ORDER — ONDANSETRON HCL 4 MG/2ML IJ SOLN: 4.0000 mg | Freq: Four times a day (QID) | INTRAMUSCULAR | Status: DC | PRN

## 2019-02-06 MED ORDER — CARBIDOPA-LEVODOPA 25-100 MG PO TABS
1.0000 | ORAL_TABLET | Freq: Three times a day (TID) | ORAL | Status: DC
  Administered 2019-02-06 – 2019-02-11 (×16): 1 via ORAL
  Filled 2019-02-06 (×18): qty 1

## 2019-02-06 MED ORDER — CARBOXYMETHYLCELLULOSE SODIUM 0.5 % OP SOLN: 1.0000 [drp] | Freq: Four times a day (QID) | OPHTHALMIC | Status: DC

## 2019-02-06 MED ORDER — ROSUVASTATIN CALCIUM 5 MG PO TABS
5.0000 mg | ORAL_TABLET | Freq: Every evening | ORAL | Status: DC
  Administered 2019-02-06 – 2019-02-10 (×5): 5 mg via ORAL
  Filled 2019-02-06 (×5): qty 1

## 2019-02-06 MED ORDER — ONDANSETRON HCL 4 MG PO TABS: 4.0000 mg | ORAL_TABLET | Freq: Four times a day (QID) | ORAL | Status: DC | PRN

## 2019-02-06 MED ORDER — HYDROCOD POLST-CPM POLST ER 10-8 MG/5ML PO SUER
5.0000 mL | Freq: Two times a day (BID) | ORAL | Status: DC | PRN
  Administered 2019-02-09: 5 mL via ORAL
  Filled 2019-02-06 (×2): qty 5

## 2019-02-06 MED ORDER — ASCORBIC ACID 500 MG PO TABS
500.0000 mg | ORAL_TABLET | Freq: Every day | ORAL | Status: DC
  Administered 2019-02-06 – 2019-02-11 (×6): 500 mg via ORAL
  Filled 2019-02-06 (×6): qty 1

## 2019-02-06 NOTE — Progress Notes (Signed)
1650Pt arrived from first floor alert and oriented x3.  Pt VSS

## 2019-02-06 NOTE — TOC Initial Note (Signed)
Transition of Care [facility]) - Initial/Assessment Note    Patient Details  Name: Kristen Mason MRN: [id number] Date of Birth: 1934-04-25  Transition of Care (TOC) CM/SW Contact:    Joaquin Courts, RN Phone Number: 02/06/2019, 1:47 PM  Clinical Narrative:         Patient is a long term resident at [facility]. Patient will return to facility at dc. Facility requests prior notice so they can prepare a room on their isolation unit. Facility will need updated FL2 and dc summary at dc.            Expected Discharge Plan: Skilled Nursing Facility Barriers to Discharge: Continued Medical Work up   Patient Goals and CMS Choice Patient states their goals for this hospitalization and ongoing recovery are:: to return to [facility]      Expected Discharge Plan and Services Expected Discharge Plan: [place]   Discharge Planning Services: CM Consult Post Acute Care Choice: Resumption of Svcs/PTA Provider Living arrangements for the past 2 months: [place]                 DME Arranged: N/A DME Agency: NA       HH Arranged: NA [place] Agency: NA        Prior Living Arrangements/Services Living arrangements for the past 2 months: [place] Lives with:: Facility Resident Patient language and need for interpreter reviewed:: Yes Do you feel safe going back to the place where you live?: Yes      Need for Family Participation in Patient Care: Yes (Comment) Care giver support system in place?: Yes (comment)   Criminal Activity/Legal Involvement Pertinent to Current Situation/Hospitalization: No - Comment as needed  Activities of Daily Living Home Assistive Devices/Equipment: Eyeglasses, Environmental consultant (specify type) ADL Screening (condition at time of admission) Patient's cognitive ability adequate to safely complete daily activities?: Yes Is the patient deaf or have difficulty hearing?: No Does the patient have difficulty seeing, even when  wearing glasses/contacts?: Yes Does the patient have difficulty concentrating, remembering, or making decisions?: No Patient able to express need for assistance with ADLs?: Yes Does the patient have difficulty dressing or bathing?: No Independently performs ADLs?: Yes (appropriate for developmental age) Does the patient have difficulty walking or climbing stairs?: Yes Weakness of Legs: Right Weakness of Arms/Hands: None  Permission Sought/Granted                  Emotional Assessment           Psych Involvement: No (comment)  Admission diagnosis:  Cough [R05] Hypoxia [R09.02] COVID-19 virus infection [U07.1] COVID-19 [U07.1] Patient Active Problem List   Diagnosis Date Noted  . COVID-19 virus infection 02/06/2019  . COVID-19 02/05/2019  . Hypoxia   . Abdominal pain   . Femur fracture, right ([place]) 10/31/2018  . Acute on chronic kidney failure ([place]) 10/31/2018  . Macrocytic anemia 10/31/2018  . Falls 07/24/2017  . COPD exacerbation ([place]) 06/16/2017  . Parkinson's disease ([place]) 06/16/2017  . Essential tremor 06/16/2017  . Peripheral neuropathy 06/16/2017  . Depression 06/16/2017  . Anxiety 06/16/2017  . Acute respiratory failure with hypoxia ([place]) 06/07/2017  . Fall at home, initial encounter 06/07/2017  . Multiple rib fractures 06/07/2017  . Controlled diabetes mellitus ([place]) 06/07/2017  . Essential hypertension 06/07/2017  . Tobacco dependence 06/07/2017   PCP:  Wenda Low, MD Pharmacy:  No Pharmacies Listed    Social Determinants of Health (SDOH) Interventions    Readmission Risk Interventions  Readmission Risk Prevention Plan 11/03/2018  Transportation Screening Complete  PCP or Specialist Appt within 3-5 Days Complete  HRI or Home Care Consult Complete  Social Work Consult for [place] Planning/Counseling Complete  Palliative Care Screening Not Applicable  Medication Review Press photographer) Complete  Some recent data might be hidden

## 2019-02-06 NOTE — Progress Notes (Signed)
PROGRESS NOTE    Kristen Mason  [id number] DOB: 1934-05-07 DOA: 02/05/2019 PCP: Wenda Low, MD    Brief Narrative:  85 year old female with history of Parkinson's disease, profound resting tremor, hypertension, paroxysmal A. fib on anticoagulation, type 2 diabetes, hyperlipidemia, recent right traumatic femur fracture, right kidney cancer, debility sent from nursing home with diagnosis of COVID-19 on 02/02/2019 and found to be short of breath and hypoxic on mobility. In the emergency room, hemodynamically stable.  Temperature 100.9.  She was saturating 99% on 2 L.  Chest x-ray shows no obvious consolidations.   Assessment & Plan:   Active Problems:   COVID-19   COVID-19 virus infection  COVID-19 infection with hypoxia: Continue to monitor due to significant symptoms and comorbidities. chest physiotherapy, incentive spirometry, deep breathing exercises, sputum induction, mucolytic's and bronchodilators. Supplemental oxygen to keep saturations more than 90%. Covid directed therapy with , steroids, dexamethasone remdesivir, day 2/5 Due to severity of symptoms, patient will need daily inflammatory markers, liver function test to monitor and direct COVID-19 therapies.  Increase mobility with PT OT.  Type 2 diabetes: Cover with insulin.  Paroxysmal A. fib: Rate controlled.  On Eliquis.  Metoprolol on hold.  Parkinson's disease as well as resting tremor: Profound disease.  On Sinemet that she will continue.  Renal mass concerning for carcinoma: Diagnosed 2 months ago.  Patient has profound debility, she will not be able to tolerate surgical resection.  Now with Covid. I told patient's daughter that if patient dramatically improves and do have good mobility, they may consider doing biopsy, however with current functional status may just need to monitor.   DVT prophylaxis: Eliquis Code Status: Full code.  Patient stated she wants to live and wants to have resuscitation done for  her. Discussed with daughter, there is no formal DNR paperwork done.  Daughter wants to keep her full code, if comes to the point of irreversible condition, she will consider making decisions. Family Communication: Patient's daughter. Disposition Plan: Back to SNF when stable.   Consultants:   None  Procedures:   None  Antimicrobials:  Anti-infectives (From admission, onward)   Start     Dose/Rate Route Frequency Ordered Stop   02/07/19 1000  remdesivir 100 mg in sodium chloride 0.9 % 100 mL IVPB  Status:  Discontinued     100 mg 200 mL/hr over 30 Minutes Intravenous Daily 02/06/19 0112 02/06/19 0115   02/06/19 1800  remdesivir 100 mg in sodium chloride 0.9 % 100 mL IVPB  Status:  Discontinued     100 mg 200 mL/hr over 30 Minutes Intravenous Daily 02/05/19 1727 02/06/19 0115   02/06/19 1800  remdesivir 100 mg in sodium chloride 0.9 % 100 mL IVPB     100 mg 200 mL/hr over 30 Minutes Intravenous Daily-1800 02/06/19 0116 02/10/19 1759   02/06/19 0115  remdesivir 200 mg in sodium chloride 0.9% 250 mL IVPB  Status:  Discontinued     200 mg 580 mL/hr over 30 Minutes Intravenous Once 02/06/19 0112 02/06/19 0115   02/05/19 1800  remdesivir 200 mg in sodium chloride 0.9% 250 mL IVPB     200 mg 580 mL/hr over 30 Minutes Intravenous Once 02/05/19 1727 02/05/19 1846         Subjective: Patient seen and examined.  Sitting in chair and trying to eat breakfast.  On 2 L oxygen.  Patient states that she had diarrhea but no other symptoms.  Remains afebrile.  Objective: Vitals:   02/06/19 0500 02/06/19 [id number]  02/06/19 0800 02/06/19 1143  BP:  (!) 134/53 (!) 128/58 135/71  Pulse:  (!) 55 (!) 56 (!) 53  Resp:  13 18 18   Temp:  97.9 F (36.6 C) 98.2 F (36.8 C) 99.4 F (37.4 C)  TempSrc:  Oral Oral Oral  SpO2:  99% 97% 95%  Weight: 70.8 kg 70.4 kg    Height:        Intake/Output Summary (Last 24 hours) at 02/06/2019 1328 Last data filed at 02/06/2019 0430 Gross per 24 hour  Intake 0  ml  Output 0 ml  Net 0 ml   Filed Weights   02/06/19 0500 02/06/19 0509  Weight: 70.8 kg 70.4 kg    Examination:  General exam: Appears calm and comfortable, on 2 L oxygen.  Profound resting tremor. Respiratory system: Clear to auscultation. Respiratory effort normal.  No added sound. Cardiovascular system: S1 & S2 heard, RRR. No JVD, murmurs, rubs, gallops or clicks. No pedal edema. Gastrointestinal system: Abdomen is nondistended, soft and nontender. No organomegaly or masses felt. Normal bowel sounds heard. Central nervous system: Alert and oriented. No focal neurological deficits.  Coarse resting tremors. Extremities: Symmetric 5 x 5 power. Skin: No rashes, lesions or ulcers Psychiatry: Judgement and insight appear normal. Mood & affect appropriate.     Data Reviewed: I have personally reviewed following labs and imaging studies  CBC: Recent Labs  Lab 02/05/19 1303  WBC 3.3*  NEUTROABS 2.0  HGB 11.9*  HCT 37.9  MCV 100.5*  PLT 123456*   Basic Metabolic Panel: Recent Labs  Lab 02/05/19 1303 02/05/19 1519  NA 140  --   K 3.6  --   CL 105  --   CO2 26  --   GLUCOSE 142*  --   BUN 23  --   CREATININE 1.15*  --   CALCIUM 8.6*  --   MG  --  1.8   GFR: Estimated Creatinine Clearance: 37.2 mL/min (A) (by C-G formula based on SCr of 1.15 mg/dL (H)). Liver Function Tests: Recent Labs  Lab 02/05/19 1303  AST 16  ALT 6  ALKPHOS 106  BILITOT 0.5  PROT 6.8  ALBUMIN 3.3*   Recent Labs  Lab 02/05/19 1303  LIPASE 24   No results for input(s): AMMONIA in the last 168 hours. Coagulation Profile: No results for input(s): INR, PROTIME in the last 168 hours. Cardiac Enzymes: No results for input(s): CKTOTAL, CKMB, CKMBINDEX, TROPONINI in the last 168 hours. BNP (last 3 results) No results for input(s): PROBNP in the last 8760 hours. HbA1C: No results for input(s): HGBA1C in the last 72 hours. CBG: Recent Labs  Lab 02/06/19 0632 02/06/19 1142  GLUCAP 82  136*   Lipid Profile: No results for input(s): CHOL, HDL, LDLCALC, TRIG, CHOLHDL, LDLDIRECT in the last 72 hours. Thyroid Function Tests: Recent Labs    02/05/19 1303  TSH 0.182*   Anemia Panel: Recent Labs    02/05/19 2115  FERRITIN 189   Sepsis Labs: Recent Labs  Lab 02/05/19 1305  LATICACIDVEN 0.8    Recent Results (from the past 240 hour(s))  Blood culture (routine x 2)     Status: None (Preliminary result)   Collection Time: 02/05/19  1:05 PM   Specimen: BLOOD LEFT FOREARM  Result Value Ref Range Status   Specimen Description BLOOD LEFT FOREARM  Final   Special Requests   Final    BOTTLES DRAWN AEROBIC AND ANAEROBIC Blood Culture results may not be optimal due to an  inadequate volume of blood received in culture bottles   Culture   Final    NO GROWTH < 24 HOURS Performed at [facility] [address], [place], [place] [address]    Report Status PENDING  Incomplete  Blood culture (routine x 2)     Status: None (Preliminary result)   Collection Time: 02/05/19  3:19 PM   Specimen: BLOOD RIGHT HAND  Result Value Ref Range Status   Specimen Description BLOOD RIGHT HAND  Final   Special Requests   Final    BOTTLES DRAWN AEROBIC AND ANAEROBIC Blood Culture adequate volume   Culture   Final    NO GROWTH < 24 HOURS Performed at [facility], [place] [address] [place], [place] [address]    Report Status PENDING  Incomplete  Urine culture     Status: Abnormal (Preliminary result)   Collection Time: 02/05/19  6:20 PM   Specimen: Urine, Clean Catch  Result Value Ref Range Status   Specimen Description URINE, CLEAN CATCH  Final   Special Requests NONE  Final   Culture (A)  Final    >=100,000 COLONIES/mL GRAM NEGATIVE RODS IDENTIFICATION AND SUSCEPTIBILITIES TO FOLLOW Performed at [facility], [address], [place], [place] [address]    Report Status PENDING  Incomplete  MRSA PCR Screening     Status: None   Collection Time: 02/06/19  4:48 AM    Specimen: Nasal Mucosa; Nasopharyngeal  Result Value Ref Range Status   MRSA by PCR NEGATIVE NEGATIVE Final    Comment:        The GeneXpert MRSA Assay (FDA approved for NASAL specimens only), is one component of a comprehensive MRSA colonization surveillance program. It is not intended to diagnose MRSA infection nor to guide or monitor treatment for MRSA infections. Performed at [facility], [place] [address], [place], [place] [address]          Radiology Studies: DG Chest Portable 1 View  Result Date: 02/05/2019 CLINICAL DATA:  Cough and hypoxia. COVID positive. EXAM: PORTABLE CHEST 1 VIEW COMPARISON:  Chest radiograph dated 06/07/2017 FINDINGS: The heart size is within normal limits. Vascular calcifications are seen in the aortic arch. Both lungs are clear. The visualized skeletal structures are unremarkable. IMPRESSION: No active cardiopulmonary disease. Aortic Atherosclerosis (ICD10-I70.0). Electronically Signed   By: Zerita Boers M.D.   On: 02/05/2019 13:26        Scheduled Meds: . albuterol  2 puff Inhalation Q6H  . amLODipine  5 mg Oral Daily  . apixaban  5 mg Oral BID  . ascorbic acid  500 mg Oral Daily  . aspirin EC  81 mg Oral Daily  . carbidopa-levodopa  1 tablet Oral TID  . cholecalciferol  2,000 Units Oral Daily  . citalopram  40 mg Oral Daily  . clonazePAM  1 mg Oral QHS  . dexamethasone (DECADRON) injection  6 mg Intravenous Q24H  . dicyclomine  10 mg Oral TID PC & HS  . docusate sodium  100 mg Oral BID  . ferrous sulfate  325 mg Oral Q breakfast  . gabapentin  800 mg Oral TID  . hydrOXYzine  25 mg Oral QHS  . insulin aspart  0-9 Units Subcutaneous TID WC  . loratadine  10 mg Oral Daily  . mouth rinse  15 mL Mouth Rinse BID  . pantoprazole  40 mg Oral Daily  . polyvinyl alcohol  1 drop Both Eyes QID  .  primidone  250 mg Oral BID  . rosuvastatin  5 mg Oral QPM  . zinc sulfate  220 mg Oral Daily   Continuous Infusions: .  remdesivir 100 mg in NS 100 mL       LOS: 1 day    Time spent: 30 minutes    Barb Merino, MD [facility] Pager [phone]

## 2019-02-06 NOTE — Plan of Care (Signed)
Discussed with patient admission information, pain management and purewick with some teach back displayed

## 2019-02-06 NOTE — Progress Notes (Signed)
PHARMACY BRIEF NOTE: CITALOPRAM DOSAGE  On citalopram 40 mg daily as taken PTA.   Current FDA labeling recommends limiting citalopram dosage to 20mg  daily when age > 60 due to increased risk of QTc prolongation and life-threatening arrhythmias.   QTc = 450 msec on admission EKG  Recommend: Consider risk vs benefit of continuing present citalopram dosage.  Clayburn Pert, PharmD, BCPS 02/06/2019  8:02 AM

## 2019-02-06 NOTE — ED Notes (Signed)
[facility] called. 

## 2019-02-06 NOTE — Plan of Care (Signed)
  Problem: Education: Goal: Knowledge of General Education information will improve Description: Including pain rating scale, medication(s)/side effects and non-pharmacologic comfort measures Outcome: Progressing   Problem: Activity: Goal: Risk for activity intolerance will decrease Outcome: Progressing   Problem: Elimination: Goal: Will not experience complications related to urinary retention Outcome: Progressing   Problem: Pain Managment: Goal: General experience of comfort will improve Outcome: Progressing   Problem: Safety: Goal: Ability to remain free from injury will improve Outcome: Progressing   Problem: Skin Integrity: Goal: Risk for impaired skin integrity will decrease Outcome: Progressing   

## 2019-02-06 NOTE — Consult Note (Addendum)
   [facility] CM Inpatient Consult   02/06/2019  Kristen Mason [birth year] [id number]   Patient screened for extreme high risk scoreof 30% in the [place] in the [place].  Review of patient's medical record reveals from inpatient Transition of Care team member that patient is a long term resident at a facility.  Plan:  Sign off as patient has no needs for [facility] in a facility.  Primary Care Provider is Wenda Low, MD  For questions or changes contact:   Natividad Brood, RN BSN [facility] Liaison  [phone] business mobile phone Toll free office [phone]  Fax number: [phone] [name]

## 2019-02-07 ENCOUNTER — Encounter (HOSPITAL_COMMUNITY): Payer: Self-pay | Admitting: Internal Medicine

## 2019-02-07 DIAGNOSIS — N390 Urinary tract infection, site not specified: Secondary | ICD-10-CM | POA: Diagnosis present

## 2019-02-07 LAB — CBC WITH DIFFERENTIAL/PLATELET
Abs Immature Granulocytes: 0.01 10*3/uL (ref 0.00–0.07)
Basophils Absolute: 0 10*3/uL (ref 0.0–0.1)
Basophils Relative: 0 %
Eosinophils Absolute: 0 10*3/uL (ref 0.0–0.5)
Eosinophils Relative: 0 %
HCT: 35 % — ABNORMAL LOW (ref 36.0–46.0)
Hemoglobin: 11 g/dL — ABNORMAL LOW (ref 12.0–15.0)
Immature Granulocytes: 1 %
Lymphocytes Relative: 26 %
Lymphs Abs: 0.6 10*3/uL — ABNORMAL LOW (ref 0.7–4.0)
MCH: 31.1 pg (ref 26.0–34.0)
MCHC: 31.4 g/dL (ref 30.0–36.0)
MCV: 98.9 fL (ref 80.0–100.0)
Monocytes Absolute: 0.2 10*3/uL (ref 0.1–1.0)
Monocytes Relative: 8 %
Neutro Abs: 1.4 10*3/uL — ABNORMAL LOW (ref 1.7–7.7)
Neutrophils Relative %: 65 %
Platelets: 103 10*3/uL — ABNORMAL LOW (ref 150–400)
RBC: 3.54 MIL/uL — ABNORMAL LOW (ref 3.87–5.11)
RDW: 15.2 % (ref 11.5–15.5)
WBC: 2.1 10*3/uL — ABNORMAL LOW (ref 4.0–10.5)
nRBC: 0 % (ref 0.0–0.2)

## 2019-02-07 LAB — GLUCOSE, CAPILLARY
Glucose-Capillary: 127 mg/dL — ABNORMAL HIGH (ref 70–99)
Glucose-Capillary: 130 mg/dL — ABNORMAL HIGH (ref 70–99)
Glucose-Capillary: 158 mg/dL — ABNORMAL HIGH (ref 70–99)

## 2019-02-07 LAB — COMPREHENSIVE METABOLIC PANEL
ALT: 5 U/L (ref 0–44)
AST: 14 U/L — ABNORMAL LOW (ref 15–41)
Albumin: 3.2 g/dL — ABNORMAL LOW (ref 3.5–5.0)
Alkaline Phosphatase: 84 U/L (ref 38–126)
Anion gap: 11 (ref 5–15)
BUN: 21 mg/dL (ref 8–23)
CO2: 24 mmol/L (ref 22–32)
Calcium: 8 mg/dL — ABNORMAL LOW (ref 8.9–10.3)
Chloride: 103 mmol/L (ref 98–111)
Creatinine, Ser: 0.98 mg/dL (ref 0.44–1.00)
GFR calc Af Amer: >60 mL/min (ref >60)
GFR calc non Af Amer: 54 mL/min — ABNORMAL LOW (ref >60)
Glucose, Bld: 131 mg/dL — ABNORMAL HIGH (ref 70–99)
Potassium: 3.9 mmol/L (ref 3.5–5.1)
Sodium: 138 mmol/L (ref 135–145)
Total Bilirubin: 0.5 mg/dL (ref 0.3–1.2)
Total Protein: 6.4 g/dL — ABNORMAL LOW (ref 6.5–8.1)

## 2019-02-07 LAB — URINE CULTURE: Culture: >=100000 — AB

## 2019-02-07 LAB — D-DIMER, QUANTITATIVE: D-Dimer, Quant: 0.75 ug/mL-FEU — ABNORMAL HIGH (ref 0.00–0.50)

## 2019-02-07 LAB — FERRITIN: Ferritin: 217 ng/mL (ref 11–307)

## 2019-02-07 LAB — MAGNESIUM: Magnesium: 1.9 mg/dL (ref 1.7–2.4)

## 2019-02-07 LAB — PHOSPHORUS: Phosphorus: 3 mg/dL (ref 2.5–4.6)

## 2019-02-07 LAB — C-REACTIVE PROTEIN: CRP: 7.7 mg/dL — ABNORMAL HIGH (ref <1.0)

## 2019-02-07 MED ORDER — SODIUM CHLORIDE 0.9 % IV SOLN
1.0000 g | INTRAVENOUS | Status: DC
  Administered 2019-02-07 – 2019-02-09 (×3): 1 g via INTRAVENOUS
  Filled 2019-02-07 (×3): qty 10

## 2019-02-07 NOTE — Progress Notes (Signed)
PROGRESS NOTE    Kristen Mason  [id number] DOB: 21-May-1934 DOA: 02/05/2019 PCP: Wenda Low, MD    Brief Narrative:  85 year old female with history of Parkinson's disease, profound resting tremor, hypertension, paroxysmal A. fib on anticoagulation, type 2 diabetes, hyperlipidemia, recent right traumatic femur fracture, right kidney cancer, debility sent from nursing home with diagnosis of COVID-19 on 02/02/2019 and found to be short of breath and hypoxic on mobility. In the emergency room, hemodynamically stable.  Temperature 100.9.  She was saturating 99% on 2 L.  Chest x-ray shows no obvious consolidations.   Assessment & Plan:   Active Problems:   Controlled diabetes mellitus ([place])   Essential hypertension   Parkinson's disease ([place])   Essential tremor   COVID-19   COVID-19 virus infection   Acute lower UTI  COVID-19 infection with hypoxia: Most of the pulmonary symptoms improved.  Remains on room air mostly. chest physiotherapy, incentive spirometry, deep breathing exercises, sputum induction, mucolytic's and bronchodilators. Supplemental oxygen to keep saturations more than 90%. Covid directed therapy with , steroids, dexamethasone remdesivir, day 3./5 Due to severity of symptoms, patient will need daily inflammatory markers, liver function test to monitor and direct COVID-19 therapies.  Increase mobility with PT OT.  Type 2 diabetes: Cover with insulin.  Paroxysmal A. fib: Rate controlled.  On Eliquis.  Metoprolol on hold due to bradycardia.  May not need it.  Parkinson's disease as well as resting tremor: Profound disease.  On Sinemet that she will continue.  Renal mass concerning for carcinoma: Diagnosed 2 months ago.  Patient has profound debility, she will not be able to tolerate surgical resection.  Now with Covid. I told patient's daughter that if patient dramatically improves and do have good mobility, they may consider doing biopsy, however with current  functional status may just need to monitor.  Acute UTI present on admission: Reported symptoms of urinary frequency on presentation.  Cultures with Klebsiella.  Start Rocephin.  Can change to oral antibiotics on discharge.   DVT prophylaxis: Eliquis Code Status: Full code.  Patient stated she wants to live and wants to have resuscitation done for her. Discussed with daughter, there is no formal DNR paperwork done.  Daughter wants to keep her full code, if comes to the point of irreversible condition, she will consider making decisions. Family Communication: Patient's daughter. Disposition Plan: Back to SNF when stable.  Patient does not have much pulmonary symptoms.  If remains a stable, may be able to go back to nursing home tomorrow with oral antibiotics for UTI.   Consultants:   None  Procedures:   None  Antimicrobials:  Anti-infectives (From admission, onward)   Start     Dose/Rate Route Frequency Ordered Stop   02/07/19 1330  cefTRIAXone (ROCEPHIN) 1 g in sodium chloride 0.9 % 100 mL IVPB     1 g 200 mL/hr over 30 Minutes Intravenous Every 24 hours 02/07/19 1211     02/07/19 1000  remdesivir 100 mg in sodium chloride 0.9 % 100 mL IVPB  Status:  Discontinued     100 mg 200 mL/hr over 30 Minutes Intravenous Daily 02/06/19 0112 02/06/19 0115   02/06/19 1800  remdesivir 100 mg in sodium chloride 0.9 % 100 mL IVPB  Status:  Discontinued     100 mg 200 mL/hr over 30 Minutes Intravenous Daily 02/05/19 1727 02/06/19 0115   02/06/19 1800  remdesivir 100 mg in sodium chloride 0.9 % 100 mL IVPB     100 mg 200 mL/hr  over 30 Minutes Intravenous Daily-1800 02/06/19 0116 02/10/19 1759   02/06/19 0115  remdesivir 200 mg in sodium chloride 0.9% 250 mL IVPB  Status:  Discontinued     200 mg 580 mL/hr over 30 Minutes Intravenous Once 02/06/19 0112 02/06/19 0115   02/05/19 1800  remdesivir 200 mg in sodium chloride 0.9% 250 mL IVPB     200 mg 580 mL/hr over 30 Minutes Intravenous Once  02/05/19 1727 02/05/19 1846         Subjective: Seen and examined.  No overnight events.  Afebrile.  Mostly on room air.  Less tremors today.  Objective: Vitals:   02/06/19 1700 02/06/19 2008 02/07/19 0407 02/07/19 1100  BP: (!) 114/47 (!) 138/37 135/79 119/73  Pulse: 60 (!) 54 73 72  Resp: 18 18 18 18   Temp: 98.8 F (37.1 C) 99.4 F (37.4 C) 99.3 F (37.4 C) 98.4 F (36.9 C)  TempSrc: Oral Oral Oral Oral  SpO2: 99% 92% (!) 88% 98%  Weight:      Height:        Intake/Output Summary (Last 24 hours) at 02/07/2019 1402 Last data filed at 02/07/2019 1200 Gross per 24 hour  Intake 560 ml  Output 1200 ml  Net -640 ml   Filed Weights   02/06/19 0500 02/06/19 0509  Weight: 70.8 kg 70.4 kg    Examination:  General exam: Appears calm and comfortable, chronically sick looking. Respiratory system: Clear to auscultation. Respiratory effort normal.  No added sound. Cardiovascular system: S1 & S2 heard, RRR. No JVD, murmurs, rubs, gallops or clicks. No pedal edema. Gastrointestinal system: Abdomen is nondistended, soft and nontender. No organomegaly or masses felt. Normal bowel sounds heard. Central nervous system: Alert and oriented. No focal neurological deficits.  Coarse resting tremors. Extremities: Symmetric 5 x 5 power. Skin: No rashes, lesions or ulcers Psychiatry: Judgement and insight appear normal. Mood & affect appropriate.     Data Reviewed: I have personally reviewed following labs and imaging studies  CBC: Recent Labs  Lab 02/05/19 1303 02/07/19 0600  WBC 3.3* 2.1*  NEUTROABS 2.0 1.4*  HGB 11.9* 11.0*  HCT 37.9 35.0*  MCV 100.5* 98.9  PLT 113* [id number]*   Basic Metabolic Panel: Recent Labs  Lab 02/05/19 1303 02/05/19 1519 02/07/19 0600  NA 140  --  138  K 3.6  --  3.9  CL 105  --  103  CO2 26  --  24  GLUCOSE 142*  --  131*  BUN 23  --  21  CREATININE 1.15*  --  0.98  CALCIUM 8.6*  --  8.0*  MG  --  1.8 1.9  PHOS  --   --  3.0    GFR: Estimated Creatinine Clearance: 43.6 mL/min (by C-G formula based on SCr of 0.98 mg/dL). Liver Function Tests: Recent Labs  Lab 02/05/19 1303 02/07/19 0600  AST 16 14*  ALT 6 5  ALKPHOS 106 84  BILITOT 0.5 0.5  PROT 6.8 6.4*  ALBUMIN 3.3* 3.2*   Recent Labs  Lab 02/05/19 1303  LIPASE 24   No results for input(s): AMMONIA in the last 168 hours. Coagulation Profile: No results for input(s): INR, PROTIME in the last 168 hours. Cardiac Enzymes: No results for input(s): CKTOTAL, CKMB, CKMBINDEX, TROPONINI in the last 168 hours. BNP (last 3 results) No results for input(s): PROBNP in the last 8760 hours. HbA1C: No results for input(s): HGBA1C in the last 72 hours. CBG: Recent Labs  Lab 02/06/19 [id number] 02/06/19  1142 02/06/19 1607 02/06/19 2035 02/07/19 0738  GLUCAP 82 136* 94 127* 127*   Lipid Profile: No results for input(s): CHOL, HDL, LDLCALC, TRIG, CHOLHDL, LDLDIRECT in the last 72 hours. Thyroid Function Tests: Recent Labs    02/05/19 1303  TSH 0.182*   Anemia Panel: Recent Labs    02/05/19 2115 02/07/19 0600  FERRITIN 189 217   Sepsis Labs: Recent Labs  Lab 02/05/19 1305  LATICACIDVEN 0.8    Recent Results (from the past 240 hour(s))  Blood culture (routine x 2)     Status: None (Preliminary result)   Collection Time: 02/05/19  1:05 PM   Specimen: BLOOD LEFT FOREARM  Result Value Ref Range Status   Specimen Description BLOOD LEFT FOREARM  Final   Special Requests   Final    BOTTLES DRAWN AEROBIC AND ANAEROBIC Blood Culture results may not be optimal due to an inadequate volume of blood received in culture bottles   Culture   Final    NO GROWTH 2 DAYS Performed at [facility], [place] [address], [place], [place] [address]    Report Status PENDING  Incomplete  Blood culture (routine x 2)     Status: None (Preliminary result)   Collection Time: 02/05/19  3:19 PM   Specimen: BLOOD RIGHT HAND  Result Value Ref Range Status   Specimen  Description BLOOD RIGHT HAND  Final   Special Requests   Final    BOTTLES DRAWN AEROBIC AND ANAEROBIC Blood Culture adequate volume   Culture   Final    NO GROWTH 2 DAYS Performed at [facility], [place] [address] [place], [place] [address]    Report Status PENDING  Incomplete  Urine culture     Status: Abnormal   Collection Time: 02/05/19  6:20 PM   Specimen: Urine, Clean Catch  Result Value Ref Range Status   Specimen Description URINE, CLEAN CATCH  Final   Special Requests   Final    NONE Performed at [facility], [place] [address], [place], [place] [address]    Culture >=100,000 COLONIES/mL PROTEUS MIRABILIS (A)  Final   Report Status 02/07/2019 FINAL  Final   Organism ID, Bacteria PROTEUS MIRABILIS (A)  Final      Susceptibility   Proteus mirabilis - MIC*    AMPICILLIN >=32 RESISTANT Resistant     CEFAZOLIN <=4 SENSITIVE Sensitive     CEFTRIAXONE <=0.25 SENSITIVE Sensitive     CIPROFLOXACIN >=4 RESISTANT Resistant     GENTAMICIN <=1 SENSITIVE Sensitive     IMIPENEM 8 INTERMEDIATE Intermediate     NITROFURANTOIN 128 RESISTANT Resistant     TRIMETH/SULFA >=320 RESISTANT Resistant     AMPICILLIN/SULBACTAM 8 SENSITIVE Sensitive     PIP/TAZO <=4 SENSITIVE Sensitive     * >=100,000 COLONIES/mL PROTEUS MIRABILIS  MRSA PCR Screening     Status: None   Collection Time: 02/06/19  4:48 AM   Specimen: Nasal Mucosa; Nasopharyngeal  Result Value Ref Range Status   MRSA by PCR NEGATIVE NEGATIVE Final    Comment:        The GeneXpert MRSA Assay (FDA approved for NASAL specimens only), is one component of a comprehensive MRSA colonization surveillance program. It is not intended to diagnose MRSA infection nor to guide or monitor treatment for MRSA infections. Performed at [facility], [place] [address], [place], [place] [address]          Radiology Studies: No results found.  Scheduled Meds: . albuterol  2 puff Inhalation Q6H  .  amLODipine  5 mg Oral Daily  . apixaban  5 mg Oral BID  . ascorbic acid  500 mg Oral Daily  . aspirin EC  81 mg Oral Daily  . carbidopa-levodopa  1 tablet Oral TID  . cholecalciferol  2,000 Units Oral Daily  . citalopram  40 mg Oral Daily  . clonazePAM  1 mg Oral QHS  . dexamethasone (DECADRON) injection  6 mg Intravenous Q24H  . dicyclomine  10 mg Oral TID PC & HS  . docusate sodium  100 mg Oral BID  . ferrous sulfate  325 mg Oral Q breakfast  . gabapentin  800 mg Oral TID  . hydrOXYzine  25 mg Oral QHS  . insulin aspart  0-9 Units Subcutaneous TID WC  . loratadine  10 mg Oral Daily  . mouth rinse  15 mL Mouth Rinse BID  . pantoprazole  40 mg Oral Daily  . polyvinyl alcohol  1 drop Both Eyes QID  . primidone  250 mg Oral BID  . rosuvastatin  5 mg Oral QPM  . zinc sulfate  220 mg Oral Daily   Continuous Infusions: . cefTRIAXone (ROCEPHIN)  IV    . remdesivir 100 mg in NS 100 mL Stopped (02/06/19 2000)     LOS: 2 days    Time spent: 30 minutes    Barb Merino, MD [facility] Pager [phone]

## 2019-02-07 NOTE — Progress Notes (Addendum)
Occupational Therapy Evaluation Patient Details Name: Kristen Mason MRN: [id number] DOB: Nov 03, 1934 Today's Date: 02/07/2019    History of Present Illness 85 year old female with history of Parkinson's disease, profound resting tremor, hypertension, paroxysmal A. fib on anticoagulation, type 2 diabetes, hyperlipidemia, recent right traumatic femur fracture, right kidney cancer, debility sent from nursing home with diagnosis of COVID-19 on 02/02/2019 and found to be short of breath and hypoxic on mobility.In the emergency room, hemodynamically stable.  Temperature 100.9.  She was saturating 99% on 2 L.  Chest x-ray shows no obvious consolidations.   Clinical Impression   Patient admitted from SNF where she went in Oct. after a femur fx due to fall.  She has significant tremors and stated she requires assistance with feeding at baseline.  She was able to drink with modified independence and set up after given an adapted cup.  Patient required min assist to stand and transfer to bed side commode using rolling walker.  She needed max assistance with toileting and clothing management.  SpO2 was in the mid 90s throughtout session, but may have been unreliable due to her tremors.  Walked 20 ft in room with min assist and she was slightly short of breath after walk.  Patient is very kind and determined to improve.  OT will follow acutely to work on improving activity tolerance for ADLs. She is appropriate to return to SNF.    Follow Up Recommendations  SNF    Equipment Recommendations       Recommendations for Other Services       Precautions / Restrictions Precautions Precautions: Fall Precaution Comments: wears depends      Mobility Bed Mobility Overal bed mobility: Needs Assistance Bed Mobility: Supine to Sit           Transfers Overall transfer level: Needs assistance Equipment used: Rolling walker (2 wheeled) Transfers: Sit to/from [facility] Sit to Stand: Min  assist Stand pivot transfers: Min assist       Balance Overall balance assessment: Needs assistance;History of Falls Sitting-balance support: Bilateral upper extremity supported;Feet supported Sitting balance-Leahy Scale: Poor     Standing balance support: Bilateral upper extremity supported;During functional activity  Standing balance comment: reliant on UE support                           ADL either performed or assessed with clinical judgement   ADL Overall ADL's : Needs assistance/impaired   Eating/Feeding Details (indicate cue type and reason): Mod assistance. Gave her adapted cup for drinking, would benefit from adapted utensils for tremors.               Lower Body Dressing: Maximal assistance   Toilet Transfer: Minimal assistance;BSC;Ambulation   Toileting- Clothing Manipulation and Hygiene: Maximal assistance       Functional mobility during ADLs: Minimal assistance;Rolling walker       Vision         Perception     Praxis      Pertinent Vitals/Pain Pain Assessment: No/denies pain     Hand Dominance Right   Extremity/Trunk Assessment Upper Extremity Assessment Upper Extremity Assessment: Generalized weakness (Has significant tremors)   Lower Extremity Assessment Lower Extremity Assessment: Generalized weakness   Cervical / Trunk Assessment Cervical / Trunk Assessment: Kyphotic   Communication Communication Communication: No difficulties   Cognition Arousal/Alertness: Awake/alert Behavior During Therapy: WFL for tasks assessed/performed Overall Cognitive Status: Within Functional Limits for tasks assessed  General Comments: oriented to place and reason, some decreased safety,   General Comments       Exercises     Shoulder Instructions      Home Living Family/patient expects to be discharged to:: Skilled nursing facility                                  Additional Comments: patient reports that she ambulated with RW and someone beside her      Prior Functioning/Environment          Comments: staff assists with ADL's        OT Problem List:        OT Treatment/Interventions:      OT Goals(Current goals can be found in the care plan section) Acute Rehab OT Goals Patient Stated Goal: To go home   OT Frequency: Min 2X/week   Barriers to D/C:            Co-evaluation              AM-PAC OT "6 Clicks" Daily Activity     Outcome Measure Help from another person eating meals?: A Lot Help from another person taking care of personal grooming?: A Little Help from another person toileting, which includes using toliet, bedpan, or urinal?: A Lot Help from another person bathing (including washing, rinsing, drying)?: A Lot Help from another person to put on and taking off regular upper body clothing?: A Lot Help from another person to put on and taking off regular lower body clothing?: A Lot 6 Click Score: 13   End of Session Equipment Utilized During Treatment: Gait belt;Rolling walker Nurse Communication: Mobility status  Activity Tolerance: Patient tolerated treatment well Patient left: in chair;with call bell/phone within reach;with chair alarm set  OT Visit Diagnosis: Unsteadiness on feet (R26.81);Other abnormalities of gait and mobility (R26.89);History of falling (Z91.81);Feeding difficulties (R63.3)                Time: [id number] OT Time Calculation (min): 58 min Charges:  OT General Charges $OT Visit: 1 Visit OT Evaluation $OT Eval Moderate Complexity: 1 Mod OT Treatments $Self Care/Home Management : 38-52 mins  August Luz, OTR/L   Phylliss Bob 02/07/2019, 1:48 PM

## 2019-02-07 NOTE — Evaluation (Signed)
Physical Therapy Evaluation Patient Details Name: Kristen Mason MRN: [id number] DOB: 03-Apr-1934 Today's Date: 02/07/2019   History of Present Illness  85 year old female with history of Parkinson's disease, profound resting tremor, hypertension, paroxysmal A. fib on anticoagulation, type 2 diabetes, hyperlipidemia, recent right traumatic femur fracture, right kidney cancer, debility sent from nursing home with diagnosis of COVID-19 on 02/02/2019 and found to be short of breath and hypoxic on mobility.In the emergency room, hemodynamically stable.  Temperature 100.9.  She was saturating 99% on 2 L.  Chest x-ray shows no obvious consolidations.  Clinical Impression  The patient is very motivated to resume activity. Per  Patient, she ambulated with Rw and assistance to Stand by at facility. Patient's Saturation on RA 100%. Patient should progress to return to SNF. Pt admitted with above diagnosis.  Pt currently with functional limitations due to the deficits listed below (see PT Problem List). Pt will benefit from skilled PT to increase their independence and safety with mobility to allow discharge to the venue listed below.        Follow Up Recommendations SNF(return to SNF, was LTC)    Equipment Recommendations  None recommended by PT    Recommendations for Other Services       Precautions / Restrictions Precautions Precautions: Fall Precaution Comments: wears depends      Mobility  Bed Mobility Overal bed mobility: Needs Assistance Bed Mobility: Supine to Sit     Supine to sit: Min guard;HOB elevated     General bed mobility comments: patient self assisted legs and trunk.  Transfers Overall transfer level: Needs assistance Equipment used: Rolling walker (2 wheeled) Transfers: Sit to/from [facility] Sit to Stand: Min assist Stand pivot transfers: Min assist       General transfer comment: Patient assisted to stand from bed and poser up x 2 using RW, Assist  to pull up briefs.  Ambulation/Gait                Stairs            Wheelchair Mobility    Modified Rankin (Stroke Patients Only)       Balance Overall balance assessment: Needs assistance;History of Falls Sitting-balance support: Bilateral upper extremity supported;Feet supported Sitting balance-Leahy Scale: Fair     Standing balance support: Bilateral upper extremity supported;During functional activity Standing balance-Leahy Scale: Poor Standing balance comment: reliant on UE support                             Pertinent Vitals/Pain      Home Living Family/patient expects to be discharged to:: Skilled nursing facility                 Additional Comments: patient reports that she ambulated with RW and someone beside her    Prior Function           Comments: staff assists with ADL's     Hand Dominance   Dominant Hand: Right    Extremity/Trunk Assessment   Upper Extremity Assessment Upper Extremity Assessment: (significant tremors, holding cup is difficult, does so w/ 2 hands)    Lower Extremity Assessment Lower Extremity Assessment: Generalized weakness    Cervical / Trunk Assessment Cervical / Trunk Assessment: Kyphotic  Communication   Communication: No difficulties  Cognition Arousal/Alertness: Awake/alert Behavior During Therapy: WFL for tasks assessed/performed Overall Cognitive Status: Within Functional Limits for tasks assessed  General Comments: oriented to place and reason, some decreased safety,      General Comments      Exercises     Assessment/Plan    PT Assessment Patient needs continued PT services  PT Problem List Decreased strength;Decreased mobility;Decreased safety awareness;Decreased knowledge of precautions;Decreased activity tolerance;Decreased balance;Decreased knowledge of use of DME       PT Treatment Interventions DME  instruction;Therapeutic activities;Gait training;Therapeutic exercise;Patient/family education;Balance training;Functional mobility training    PT Goals (Current goals can be found in the Care Plan section)  Acute Rehab PT Goals Patient Stated Goal: to get up and walk PT Goal Formulation: With patient Time For Goal Achievement: 02/21/19 Potential to Achieve Goals: Good    Frequency Min 2X/week   Barriers to discharge        Co-evaluation               AM-PAC PT "6 Clicks" Mobility  Outcome Measure Help needed turning from your back to your side while in a flat bed without using bedrails?: A Little Help needed moving from lying on your back to sitting on the side of a flat bed without using bedrails?: A Little Help needed moving to and from a bed to a chair (including a wheelchair)?: A Little Help needed standing up from a chair using your arms (e.g., wheelchair or bedside chair)?: A Little Help needed to walk in hospital room?: A Lot Help needed climbing 3-5 steps with a railing? : Total 6 Click Score: 15    End of Session Equipment Utilized During Treatment: Gait belt Activity Tolerance: Patient tolerated treatment well Patient left: in chair;with call bell/phone within reach;with chair alarm set Nurse Communication: Mobility status PT Visit Diagnosis: Muscle weakness (generalized) (M62.81);History of falling (Z91.81);Difficulty in walking, not elsewhere classified (R26.2);Unsteadiness on feet (R26.81)    Time: [id number] PT Time Calculation (min) (ACUTE ONLY): 53 min   Charges:   PT Evaluation $PT Eval Moderate Complexity: 1 Mod PT Treatments $Therapeutic Activity: 23-37 mins $Self Care/Home Management: [place] Pager [phone] Office [phone]   Claretha Cooper 02/07/2019, 10:17 AM

## 2019-02-08 LAB — CBC WITH DIFFERENTIAL/PLATELET
Abs Immature Granulocytes: 0 10*3/uL (ref 0.00–0.07)
Basophils Absolute: 0 10*3/uL (ref 0.0–0.1)
Basophils Relative: 1 %
Eosinophils Absolute: 0 10*3/uL (ref 0.0–0.5)
Eosinophils Relative: 0 %
HCT: 35.8 % — ABNORMAL LOW (ref 36.0–46.0)
Hemoglobin: 11.2 g/dL — ABNORMAL LOW (ref 12.0–15.0)
Immature Granulocytes: 0 %
Lymphocytes Relative: 45 %
Lymphs Abs: 0.8 10*3/uL (ref 0.7–4.0)
MCH: 30.9 pg (ref 26.0–34.0)
MCHC: 31.3 g/dL (ref 30.0–36.0)
MCV: 98.9 fL (ref 80.0–100.0)
Monocytes Absolute: 0.1 10*3/uL (ref 0.1–1.0)
Monocytes Relative: 8 %
Neutro Abs: 0.9 10*3/uL — ABNORMAL LOW (ref 1.7–7.7)
Neutrophils Relative %: 46 %
Platelets: 110 10*3/uL — ABNORMAL LOW (ref 150–400)
RBC: 3.62 MIL/uL — ABNORMAL LOW (ref 3.87–5.11)
RDW: 15.1 % (ref 11.5–15.5)
WBC: 1.8 10*3/uL — ABNORMAL LOW (ref 4.0–10.5)
nRBC: 0 % (ref 0.0–0.2)

## 2019-02-08 LAB — COMPREHENSIVE METABOLIC PANEL
ALT: 6 U/L (ref 0–44)
AST: 18 U/L (ref 15–41)
Albumin: 3.4 g/dL — ABNORMAL LOW (ref 3.5–5.0)
Alkaline Phosphatase: 90 U/L (ref 38–126)
Anion gap: 11 (ref 5–15)
BUN: 22 mg/dL (ref 8–23)
CO2: 29 mmol/L (ref 22–32)
Calcium: 8.2 mg/dL — ABNORMAL LOW (ref 8.9–10.3)
Chloride: 102 mmol/L (ref 98–111)
Creatinine, Ser: 1.02 mg/dL — ABNORMAL HIGH (ref 0.44–1.00)
GFR calc Af Amer: 59 mL/min — ABNORMAL LOW (ref >60)
GFR calc non Af Amer: 51 mL/min — ABNORMAL LOW (ref >60)
Glucose, Bld: 175 mg/dL — ABNORMAL HIGH (ref 70–99)
Potassium: 3.7 mmol/L (ref 3.5–5.1)
Sodium: 142 mmol/L (ref 135–145)
Total Bilirubin: 0.5 mg/dL (ref 0.3–1.2)
Total Protein: 6.9 g/dL (ref 6.5–8.1)

## 2019-02-08 LAB — PHOSPHORUS: Phosphorus: 3.5 mg/dL (ref 2.5–4.6)

## 2019-02-08 LAB — GLUCOSE, CAPILLARY
Glucose-Capillary: 143 mg/dL — ABNORMAL HIGH (ref 70–99)
Glucose-Capillary: 138 mg/dL — ABNORMAL HIGH (ref 70–99)
Glucose-Capillary: 158 mg/dL — ABNORMAL HIGH (ref 70–99)

## 2019-02-08 LAB — FERRITIN: Ferritin: 225 ng/mL (ref 11–307)

## 2019-02-08 LAB — MAGNESIUM: Magnesium: 1.8 mg/dL (ref 1.7–2.4)

## 2019-02-08 LAB — D-DIMER, QUANTITATIVE: D-Dimer, Quant: 0.82 ug/mL-FEU — ABNORMAL HIGH (ref 0.00–0.50)

## 2019-02-08 LAB — C-REACTIVE PROTEIN: CRP: 6.4 mg/dL — ABNORMAL HIGH (ref <1.0)

## 2019-02-08 NOTE — Progress Notes (Signed)
PROGRESS NOTE    Ulana Gesualdi  [id number] DOB: Jul 08, 1934 DOA: 02/05/2019 PCP: Wenda Low, MD   Brief Narrative:  85 year old female with history of Parkinson's disease, profound resting tremor, hypertension, paroxysmal Tevin [place]. fib on anticoagulation, type 2 diabetes, hyperlipidemia, recent right traumatic femur fracture, right kidney cancer, debility sent from nursing home with diagnosis of COVID-19 on 02/02/2019 and found to be short of breath and hypoxic on mobility. In the emergency room, hemodynamically stable.  Temperature 100.9.  She was saturating 99% on 2 L.  Chest x-ray shows no obvious consolidations.  Assessment & Plan:   Active Problems:   Controlled diabetes mellitus ([place])   Essential hypertension   Parkinson's disease ([place])   Essential tremor   COVID-19   COVID-19 virus infection   Acute lower UTI  COVID-19 infection with hypoxia: Most of the pulmonary symptoms improved.   Currently on RA - still feels malaise Supplemental oxygen to keep saturations more than 90%. Continue steroids and remdesivir I/O, daily weights IS, OOB, prone as able  COVID-19 Labs  Recent Labs    02/05/19 2115 02/07/19 0600 02/08/19 0920  DDIMER 0.76* 0.75* 0.82*  FERRITIN 189 217 225  CRP 8.4* 7.7* 6.4*    Lab Results  Component Value Date   SARSCOV2NAA NEGATIVE 11/09/2018   [place] NEGATIVE 11/05/2018   [place] NEGATIVE 10/31/2018    Type 2 diabetes: Cover with insulin.  Paroxysmal Lavonna Lampron. fib: Rate controlled.  On Eliquis.  Metoprolol on hold due to bradycardia.  May not need it.  Parkinson's disease as well as resting tremor: Profound disease.  On Sinemet that she will continue.  Renal mass concerning for carcinoma: Diagnosed 2 months ago.  Patient has profound debility, she will not be able to tolerate surgical resection.  Now with Covid. I told patient's daughter that if patient dramatically improves and do have good mobility, they may consider doing biopsy,  however with current functional status may just need to monitor.  Acute UTI present on admission: Reported symptoms of urinary frequency on presentation.  Cultures with Klebsiella.  Start Rocephin.  Can change to oral antibiotics on discharge.  Thrombocytopenia: stable since admission, follow outpatient  DVT prophylaxis: eliquis Code Status: full  Family Communication: none at bedside - called duaghter Disposition Plan: pending further improvement  Consultants:   none  Procedures:   none  Antimicrobials: Anti-infectives (From admission, onward)   Start     Dose/Rate Route Frequency Ordered Stop   02/07/19 1330  cefTRIAXone (ROCEPHIN) 1 g in sodium chloride 0.9 % 100 mL IVPB     1 g 200 mL/hr over 30 Minutes Intravenous Every 24 hours 02/07/19 1211     02/07/19 1000  remdesivir 100 mg in sodium chloride 0.9 % 100 mL IVPB  Status:  Discontinued     100 mg 200 mL/hr over 30 Minutes Intravenous Daily 02/06/19 0112 02/06/19 0115   02/06/19 1800  remdesivir 100 mg in sodium chloride 0.9 % 100 mL IVPB  Status:  Discontinued     100 mg 200 mL/hr over 30 Minutes Intravenous Daily 02/05/19 1727 02/06/19 0115   02/06/19 1800  remdesivir 100 mg in sodium chloride 0.9 % 100 mL IVPB     100 mg 200 mL/hr over 30 Minutes Intravenous Daily-1800 02/06/19 0116 02/10/19 1759   02/06/19 0115  remdesivir 200 mg in sodium chloride 0.9% 250 mL IVPB  Status:  Discontinued     200 mg 580 mL/hr over 30 Minutes Intravenous Once 02/06/19 0112 02/06/19 0115  02/05/19 1800  remdesivir 200 mg in sodium chloride 0.9% 250 mL IVPB     200 mg 580 mL/hr over 30 Minutes Intravenous Once 02/05/19 1727 02/05/19 1846     Subjective: Feels sick, just doesn't feel right  Objective: Vitals:   02/07/19 2049 02/08/19 0347 02/08/19 0900 02/08/19 1601  BP: (!) 100/50 (!) 123/58 133/77 (!) 129/51  Pulse: 61 60 (!) 57 (!) 54  Resp: 18 16 18 20   Temp: 98.4 F (36.9 C) 97.7 F (36.5 C) 98.7 F (37.1 C) (!) 97.5  F (36.4 C)  TempSrc: Oral Oral Oral Oral  SpO2: 99%  100% 100%  Weight:      Height:        Intake/Output Summary (Last 24 hours) at 02/08/2019 1953 Last data filed at 02/08/2019 1246 Gross per 24 hour  Intake 858.65 ml  Output 790 ml  Net 68.65 ml   Filed Weights   02/06/19 0500 02/06/19 0509  Weight: 70.8 kg 70.4 kg    Examination:  General exam: Appears calm and comfortable  Respiratory system: Clear to auscultation. Respiratory effort normal. Cardiovascular system: S1 & S2 heard, RRR.  Gastrointestinal system: Abdomen is nondistended, soft and nontender. Central nervous system: Alert and oriented. No focal neurological deficits. Extremities: no lee Skin: No rashes, lesions or ulcers Psychiatry: Judgement and insight appear normal. Mood & affect appropriate.     Data Reviewed: I have personally reviewed following labs and imaging studies  CBC: Recent Labs  Lab 02/05/19 1303 02/07/19 0600 02/08/19 0920  WBC 3.3* 2.1* 1.8*  NEUTROABS 2.0 1.4* 0.9*  HGB 11.9* 11.0* 11.2*  HCT 37.9 35.0* 35.8*  MCV 100.5* 98.9 98.9  PLT 113* 103* [id number]*   Basic Metabolic Panel: Recent Labs  Lab 02/05/19 1303 02/05/19 1519 02/07/19 0600 02/08/19 0920  NA 140  --  138 142  K 3.6  --  3.9 3.7  CL 105  --  103 102  CO2 26  --  24 29  GLUCOSE 142*  --  131* 175*  BUN 23  --  21 22  CREATININE 1.15*  --  0.98 1.02*  CALCIUM 8.6*  --  8.0* 8.2*  MG  --  1.8 1.9 1.8  PHOS  --   --  3.0 3.5   GFR: Estimated Creatinine Clearance: 41.9 mL/min (Tenasia Aull) (by C-G formula based on SCr of 1.02 mg/dL (H)). Liver Function Tests: Recent Labs  Lab 02/05/19 1303 02/07/19 0600 02/08/19 0920  AST 16 14* 18  ALT 6 5 6   ALKPHOS 106 84 90  BILITOT 0.5 0.5 0.5  PROT 6.8 6.4* 6.9  ALBUMIN 3.3* 3.2* 3.4*   Recent Labs  Lab 02/05/19 1303  LIPASE 24   No results for input(s): AMMONIA in the last 168 hours. Coagulation Profile: No results for input(s): INR, PROTIME in the last 168 hours.  Cardiac Enzymes: No results for input(s): CKTOTAL, CKMB, CKMBINDEX, TROPONINI in the last 168 hours. BNP (last 3 results) No results for input(s): PROBNP in the last 8760 hours. HbA1C: No results for input(s): HGBA1C in the last 72 hours. CBG: Recent Labs  Lab 02/07/19 0738 02/07/19 1604 02/07/19 2145 02/08/19 0814 02/08/19 1138  GLUCAP 127* 130* 158* 143* 138*   Lipid Profile: No results for input(s): CHOL, HDL, LDLCALC, TRIG, CHOLHDL, LDLDIRECT in the last 72 hours. Thyroid Function Tests: No results for input(s): TSH, T4TOTAL, FREET4, T3FREE, THYROIDAB in the last 72 hours. Anemia Panel: Recent Labs    02/07/19 0600 02/08/19 0920  FERRITIN 217 225   Sepsis Labs: Recent Labs  Lab 02/05/19 1305  LATICACIDVEN 0.8    Recent Results (from the past 240 hour(s))  Blood culture (routine x 2)     Status: None (Preliminary result)   Collection Time: 02/05/19  1:05 PM   Specimen: BLOOD LEFT FOREARM  Result Value Ref Range Status   Specimen Description BLOOD LEFT FOREARM  Final   Special Requests   Final    BOTTLES DRAWN AEROBIC AND ANAEROBIC Blood Culture results may not be optimal due to an inadequate volume of blood received in culture bottles   Culture   Final    NO GROWTH 3 DAYS Performed at [facility], [place] [address], [place], [place] [address]    Report Status PENDING  Incomplete  Blood culture (routine x 2)     Status: None (Preliminary result)   Collection Time: 02/05/19  3:19 PM   Specimen: BLOOD RIGHT HAND  Result Value Ref Range Status   Specimen Description BLOOD RIGHT HAND  Final   Special Requests   Final    BOTTLES DRAWN AEROBIC AND ANAEROBIC Blood Culture adequate volume   Culture   Final    NO GROWTH 3 DAYS Performed at [facility], [place] [address], [place], [place] [address]    Report Status PENDING  Incomplete  Urine culture     Status: Abnormal   Collection Time: 02/05/19  6:20 PM   Specimen: Urine, Clean Catch  Result Value Ref  Range Status   Specimen Description URINE, CLEAN CATCH  Final   Special Requests   Final    NONE Performed at [facility], [place] [address], [place], [place] [address]    Culture >=100,000 COLONIES/mL PROTEUS MIRABILIS (Neveah Bang)  Final   Report Status 02/07/2019 FINAL  Final   Organism ID, Bacteria PROTEUS MIRABILIS (Mckinley Olheiser)  Final      Susceptibility   Proteus mirabilis - MIC*    AMPICILLIN >=32 RESISTANT Resistant     CEFAZOLIN <=4 SENSITIVE Sensitive     CEFTRIAXONE <=0.25 SENSITIVE Sensitive     CIPROFLOXACIN >=4 RESISTANT Resistant     GENTAMICIN <=1 SENSITIVE Sensitive     IMIPENEM 8 INTERMEDIATE Intermediate     NITROFURANTOIN 128 RESISTANT Resistant     TRIMETH/SULFA >=320 RESISTANT Resistant     AMPICILLIN/SULBACTAM 8 SENSITIVE Sensitive     PIP/TAZO <=4 SENSITIVE Sensitive     * >=100,000 COLONIES/mL PROTEUS MIRABILIS  MRSA PCR Screening     Status: None   Collection Time: 02/06/19  4:48 AM   Specimen: Nasal Mucosa; Nasopharyngeal  Result Value Ref Range Status   MRSA by PCR NEGATIVE NEGATIVE Final    Comment:        The GeneXpert MRSA Assay (FDA approved for NASAL specimens only), is one component of Sakshi Sermons comprehensive MRSA colonization surveillance program. It is not intended to diagnose MRSA infection nor to guide or monitor treatment for MRSA infections. Performed at [facility], [place] [address] [place], [place] [address]          Radiology Studies: No results found.      Scheduled Meds: . albuterol  2 puff Inhalation Q6H  . amLODipine  5 mg Oral Daily  . apixaban  5 mg Oral BID  . ascorbic acid  500 mg Oral Daily  . aspirin EC  81 mg Oral Daily  . carbidopa-levodopa  1 tablet Oral TID  . cholecalciferol  2,000 Units Oral  Daily  . citalopram  40 mg Oral Daily  . clonazePAM  1 mg Oral QHS  . dexamethasone (DECADRON) injection  6 mg Intravenous Q24H  . dicyclomine  10 mg Oral TID PC & HS  . docusate sodium  100 mg Oral BID  .  ferrous sulfate  325 mg Oral Q breakfast  . gabapentin  800 mg Oral TID  . hydrOXYzine  25 mg Oral QHS  . insulin aspart  0-9 Units Subcutaneous TID WC  . loratadine  10 mg Oral Daily  . mouth rinse  15 mL Mouth Rinse BID  . pantoprazole  40 mg Oral Daily  . polyvinyl alcohol  1 drop Both Eyes QID  . primidone  250 mg Oral BID  . rosuvastatin  5 mg Oral QPM  . zinc sulfate  220 mg Oral Daily   Continuous Infusions: . cefTRIAXone (ROCEPHIN)  IV 1 g (02/08/19 1325)  . remdesivir 100 mg in NS 100 mL 100 mg (02/08/19 1712)     LOS: 3 days    Time spent: over 31 min    Fayrene Helper, MD [facility] Pager amion  If 7PM-7AM, please contact night-coverage [email or web address] Password [facility] 02/08/2019, 7:53 PM

## 2019-02-08 NOTE — Plan of Care (Signed)
  Problem: Education: Goal: Knowledge of General Education information will improve Description: Including pain rating scale, medication(s)/side effects and non-pharmacologic comfort measures Outcome: Progressing   Problem: Activity: Goal: Risk for activity intolerance will decrease Outcome: Progressing   Problem: Elimination: Goal: Will not experience complications related to urinary retention Outcome: Progressing   Problem: Pain Managment: Goal: General experience of comfort will improve Outcome: Progressing   Problem: Safety: Goal: Ability to remain free from injury will improve Outcome: Progressing   Problem: Skin Integrity: Goal: Risk for impaired skin integrity will decrease Outcome: Progressing   

## 2019-02-08 NOTE — Progress Notes (Addendum)
OT note addendum   02/08/19 0900  OT - End of Session  Equipment Utilized During Treatment Gait belt;Rolling walker  Activity Tolerance Patient tolerated treatment well  Patient left in chair;with call bell/phone within reach;with chair alarm set  Nurse Communication Mobility status  OT Assessment  OT Recommendation/Assessment Patient needs continued OT Services  OT Visit Diagnosis Unsteadiness on feet (R26.81);Other abnormalities of gait and mobility (R26.89);History of falling (Z91.81);Feeding difficulties (R63.3)  OT Problem List Decreased strength;Decreased activity tolerance;Impaired balance (sitting and/or standing);Decreased coordination;Decreased safety awareness;Decreased knowledge of use of DME or AE;Cardiopulmonary status limiting activity;Impaired UE functional use   August Luz, OTR/L

## 2019-02-09 DIAGNOSIS — L8990 Pressure ulcer of unspecified site, unspecified stage: Secondary | ICD-10-CM | POA: Insufficient documentation

## 2019-02-09 LAB — FERRITIN: Ferritin: 225 ng/mL (ref 11–307)

## 2019-02-09 LAB — COMPREHENSIVE METABOLIC PANEL
ALT: 6 U/L (ref 0–44)
AST: 16 U/L (ref 15–41)
Albumin: 3.3 g/dL — ABNORMAL LOW (ref 3.5–5.0)
Alkaline Phosphatase: 87 U/L (ref 38–126)
Anion gap: 8 (ref 5–15)
BUN: 24 mg/dL — ABNORMAL HIGH (ref 8–23)
CO2: 29 mmol/L (ref 22–32)
Calcium: 8.1 mg/dL — ABNORMAL LOW (ref 8.9–10.3)
Chloride: 103 mmol/L (ref 98–111)
Creatinine, Ser: 0.95 mg/dL (ref 0.44–1.00)
GFR calc Af Amer: >60 mL/min (ref >60)
GFR calc non Af Amer: 56 mL/min — ABNORMAL LOW (ref >60)
Glucose, Bld: 187 mg/dL — ABNORMAL HIGH (ref 70–99)
Potassium: 4.4 mmol/L (ref 3.5–5.1)
Sodium: 140 mmol/L (ref 135–145)
Total Bilirubin: 0.4 mg/dL (ref 0.3–1.2)
Total Protein: 6.5 g/dL (ref 6.5–8.1)

## 2019-02-09 LAB — CBC WITH DIFFERENTIAL/PLATELET
Abs Immature Granulocytes: 0.01 10*3/uL (ref 0.00–0.07)
Basophils Absolute: 0 10*3/uL (ref 0.0–0.1)
Basophils Relative: 0 %
Eosinophils Absolute: 0 10*3/uL (ref 0.0–0.5)
Eosinophils Relative: 0 %
HCT: 32.5 % — ABNORMAL LOW (ref 36.0–46.0)
Hemoglobin: 10.1 g/dL — ABNORMAL LOW (ref 12.0–15.0)
Immature Granulocytes: 0 %
Lymphocytes Relative: 17 %
Lymphs Abs: 0.6 10*3/uL — ABNORMAL LOW (ref 0.7–4.0)
MCH: 30.7 pg (ref 26.0–34.0)
MCHC: 31.1 g/dL (ref 30.0–36.0)
MCV: 98.8 fL (ref 80.0–100.0)
Monocytes Absolute: 0.2 10*3/uL (ref 0.1–1.0)
Monocytes Relative: 6 %
Neutro Abs: 2.5 10*3/uL (ref 1.7–7.7)
Neutrophils Relative %: 77 %
Platelets: 113 10*3/uL — ABNORMAL LOW (ref 150–400)
RBC: 3.29 MIL/uL — ABNORMAL LOW (ref 3.87–5.11)
RDW: 15.1 % (ref 11.5–15.5)
WBC: 3.3 10*3/uL — ABNORMAL LOW (ref 4.0–10.5)
nRBC: 0 % (ref 0.0–0.2)

## 2019-02-09 LAB — GLUCOSE, CAPILLARY
Glucose-Capillary: 169 mg/dL — ABNORMAL HIGH (ref 70–99)
Glucose-Capillary: 153 mg/dL — ABNORMAL HIGH (ref 70–99)
Glucose-Capillary: 146 mg/dL — ABNORMAL HIGH (ref 70–99)

## 2019-02-09 LAB — C-REACTIVE PROTEIN: CRP: 4.2 mg/dL — ABNORMAL HIGH (ref <1.0)

## 2019-02-09 LAB — D-DIMER, QUANTITATIVE: D-Dimer, Quant: 0.68 ug/mL-FEU — ABNORMAL HIGH (ref 0.00–0.50)

## 2019-02-09 LAB — PHOSPHORUS: Phosphorus: 3.3 mg/dL (ref 2.5–4.6)

## 2019-02-09 LAB — MAGNESIUM: Magnesium: 2 mg/dL (ref 1.7–2.4)

## 2019-02-09 MED ORDER — CEFAZOLIN SODIUM-DEXTROSE 1-4 GM/50ML-% IV SOLN
1.0000 g | Freq: Three times a day (TID) | INTRAVENOUS | Status: DC
  Administered 2019-02-10 – 2019-02-11 (×4): 1 g via INTRAVENOUS
  Filled 2019-02-09 (×5): qty 50

## 2019-02-09 NOTE — NC FL2 (Signed)
[place] LEVEL OF CARE SCREENING TOOL     IDENTIFICATION  Patient Name: Kristen Mason Birthdate: October 11, 1934 Sex: female Admission Date (Current Location): 02/05/2019  [facility] and [place] Number:  Herbalist and Address:  [facility] [place], [place] [address] [place], [place] [address]([address])      Provider Number: [id number]  Attending Physician Name and Address:  Elodia Florence., *  Relative Name and Phone Number:  daughter: [name] [phone]    Current Level of Care: Hospital Recommended Level of Care: [place] Prior Approval Number:    Date Approved/Denied:   PASRR Number: [id number]  Discharge Plan: SNF    Current Diagnoses: Patient Active Problem List   Diagnosis Date Noted  . Acute lower UTI 02/07/2019  . COVID-19 virus infection 02/06/2019  . COVID-19 02/05/2019  . Hypoxia   . Abdominal pain   . Femur fracture, right ([place]) 10/31/2018  . Acute on chronic kidney failure ([place]) 10/31/2018  . Macrocytic anemia 10/31/2018  . Falls 07/24/2017  . COPD exacerbation ([place]) 06/16/2017  . Parkinson's disease ([place]) 06/16/2017  . Essential tremor 06/16/2017  . Peripheral neuropathy 06/16/2017  . Depression 06/16/2017  . Anxiety 06/16/2017  . Acute respiratory failure with hypoxia ([place]) 06/07/2017  . Fall at home, initial encounter 06/07/2017  . Multiple rib fractures 06/07/2017  . Controlled diabetes mellitus ([place]) 06/07/2017  . Essential hypertension 06/07/2017  . Tobacco dependence 06/07/2017    Orientation RESPIRATION BLADDER Height & Weight     Self, Time, Situation, Place  Normal Incontinent Weight: 70.4 kg Height:  5\' 5"  (165.1 cm)  BEHAVIORAL SYMPTOMS/MOOD NEUROLOGICAL BOWEL NUTRITION STATUS      Continent Diet  AMBULATORY STATUS COMMUNICATION OF NEEDS Skin   Extensive Assist Verbally Normal                       Personal Care Assistance Level of Assistance   Dressing, Bathing     Dressing Assistance: Limited assistance     Functional Limitations Info             SPECIAL CARE FACTORS FREQUENCY  PT (By licensed PT), OT (By licensed OT)     PT Frequency: 5x/week OT Frequency: min 3x/week            Contractures Contractures Info: Not present    Additional Factors Info  Code Status Code Status Info: Full Code             Current Medications (02/09/2019):  This is the current hospital active medication list Current Facility-Administered Medications  Medication Dose Route Frequency Provider Last Rate Last Admin  . acetaminophen (TYLENOL) tablet 650 mg  650 mg Oral Q6H PRN Phineas Semen, MD   650 mg at 02/07/19 1800  . albuterol (VENTOLIN HFA) 108 (90 Base) MCG/ACT inhaler 2 puff  2 puff Inhalation Q6H Phineas Semen, MD   2 puff at 02/09/19 0917  . amLODipine (NORVASC) tablet 5 mg  5 mg Oral Daily Phineas Semen, MD   5 mg at 02/09/19 1009  . apixaban (ELIQUIS) tablet 5 mg  5 mg Oral BID Phineas Semen, MD   5 mg at 02/09/19 1009  . ascorbic acid (VITAMIN C) tablet 500 mg  500 mg Oral Daily Phineas Semen, MD   500 mg at 02/09/19 1006  . aspirin EC tablet 81 mg  81 mg Oral Daily Chinbuah, Audley Hose, MD  81 mg at 02/09/19 1011  . bisacodyl (DULCOLAX) suppository 10 mg  10 mg Rectal Daily PRN Chinbuah, Audley Hose, MD      . carbidopa-levodopa (SINEMET IR) 25-100 MG per tablet immediate release 1 tablet  1 tablet Oral TID Phineas Semen, MD   1 tablet at 02/09/19 1013  . cefTRIAXone (ROCEPHIN) 1 g in sodium chloride 0.9 % 100 mL IVPB  1 g Intravenous Q24H Barb Merino, MD   Stopped at 02/08/19 1444  . chlorpheniramine-HYDROcodone (TUSSIONEX) 10-8 MG/5ML suspension 5 mL  5 mL Oral Q12H PRN Phineas Semen, MD   5 mL at 02/09/19 1005  . cholecalciferol (VITAMIN D3) tablet 2,000 Units  2,000 Units Oral Daily Phineas Semen, MD   2,000 Units at 02/09/19 1009  . citalopram (CELEXA) tablet 40 mg  40 mg Oral Daily Phineas Semen, MD   40 mg at 02/09/19 1010  . clonazePAM (KLONOPIN) tablet 1 mg  1 mg Oral QHS Phineas Semen, MD   1 mg at 02/08/19 2109  . dexamethasone (DECADRON) injection 6 mg  6 mg Intravenous Q24H Phineas Semen, MD   6 mg at 02/09/19 0120  . dicyclomine (BENTYL) capsule 10 mg  10 mg Oral TID PC & HS Phineas Semen, MD   10 mg at 02/09/19 0919  . docusate sodium (COLACE) capsule 100 mg  100 mg Oral BID Phineas Semen, MD   100 mg at 02/09/19 1006  . ferrous sulfate tablet 325 mg  325 mg Oral Q breakfast Phineas Semen, MD   325 mg at 02/09/19 0918  . gabapentin (NEURONTIN) capsule 800 mg  800 mg Oral TID Barb Merino, MD   800 mg at 02/09/19 1007  . guaiFENesin-dextromethorphan (ROBITUSSIN DM) 100-10 MG/5ML syrup 10 mL  10 mL Oral Q4H PRN Phineas Semen, MD   10 mL at 02/08/19 2109  . hydrOXYzine (ATARAX/VISTARIL) tablet 25 mg  25 mg Oral QHS Phineas Semen, MD   25 mg at 02/08/19 2109  . insulin aspart (novoLOG) injection 0-9 Units  0-9 Units Subcutaneous TID WC Phineas Semen, MD   2 Units at 02/09/19 0919  . loratadine (CLARITIN) tablet 10 mg  10 mg Oral Daily Phineas Semen, MD   10 mg at 02/09/19 1012  . MEDLINE mouth rinse  15 mL Mouth Rinse BID Phineas Semen, MD   15 mL at 02/09/19 1013  . ondansetron (ZOFRAN) tablet 4 mg  4 mg Oral Q6H PRN Chinbuah, Audley Hose, MD       Or  . ondansetron (ZOFRAN) injection 4 mg  4 mg Intravenous Q6H PRN Chinbuah, Audley Hose, MD      . pantoprazole (PROTONIX) EC tablet 40 mg  40 mg Oral Daily Phineas Semen, MD   40 mg at 02/09/19 1010  . polyvinyl alcohol (LIQUIFILM TEARS) 1.4 % ophthalmic solution 1 drop  1 drop Both Eyes QID Phineas Semen, MD   1 drop at 02/09/19 1014  . primidone (MYSOLINE) tablet 250 mg  250 mg Oral BID Phineas Semen, MD   250 mg at 02/09/19 1013  . remdesivir 100 mg in sodium chloride 0.9 % 100 mL IVPB  100 mg Intravenous q1800 Phineas Semen, MD   Stopped at 02/08/19 1800  . rosuvastatin (CRESTOR) tablet 5 mg   5 mg Oral QPM Phineas Semen, MD   5 mg at 02/08/19 1718  . traMADol (ULTRAM) tablet 50 mg  50 mg Oral Q6H PRN Phineas Semen, MD      . zinc sulfate capsule 220 mg  220 mg Oral Daily Phineas Semen, MD   220 mg at 02/09/19 1017     Discharge Medications: Please see discharge summary for a list of discharge medications.  Relevant Imaging Results:  Relevant Lab Results:   Additional Information SSN: [id number]  Ninfa Meeker, RN

## 2019-02-09 NOTE — TOC Progression Note (Signed)
Transition of Care [facility]) - Progression Note    Patient Details  Name: Kristen Mason MRN: [id number] Date of Birth: [birth year]  Transition of Care [facility]) CM/SW Contact  Loletha Grayer Beverely Pace, RN Phone Number: [phone] (working remotely) 02/09/2019, 11:42 AM  Clinical Narrative:  Case manager has completed FL2, contacted [facility] to see if patient can return on tomorrow, waiting for return phone call. Case m,anager spoke with patient and her daughter via telephone this morning.     Expected Discharge Plan: [place] Barriers to Discharge: Continued Medical Work up  Expected Discharge Plan and Services Expected Discharge Plan: [place]   Discharge Planning Services: CM Consult Post Acute Care Choice: Resumption of Svcs/PTA Provider Living arrangements for the past 2 months: [place]                 DME Arranged: N/A DME Agency: NA       HH Arranged: NA HH Agency: NA         Social Determinants of Health (SDOH) Interventions    Readmission Risk Interventions Readmission Risk Prevention Plan 11/03/2018  Transportation Screening Complete  PCP or Specialist Appt within 3-5 Days Complete  HRI or Home Care Consult Complete  Social Work Consult for [place] Planning/Counseling Complete  Palliative Care Screening Not Applicable  Medication Review Press photographer) Complete  Some recent data might be hidden

## 2019-02-09 NOTE — Progress Notes (Signed)
Pharmacy Antibiotic Note  Kristen Mason is a 85 y.o. female admitted on 02/05/2019 with COVID-19 pneumonia.  Pharmacy has been consulted for cefazolin dosing for Proteus UTI.  Plan: Cefazolin 1g IV q8h - start 1/15 at 12:00 since already received CTX today No dose adjustments needed, Pharmacy will sign off Suggest limiting treatment to shortest duration possible, if can take PO, suggest cephalexin 500mg  BID  Height: 5\' 5"  (165.1 cm) Weight: 155 lb 3.3 oz (70.4 kg) IBW/kg (Calculated) : 57  Temp (24hrs), Avg:97.7 F (36.5 C), Min:97.3 F (36.3 C), Max:98 F (36.7 C)  Recent Labs  Lab 02/05/19 1303 02/05/19 1305 02/07/19 0600 02/08/19 0920 02/09/19 0515  WBC 3.3*  --  2.1* 1.8* 3.3*  CREATININE 1.15*  --  0.98 1.02* 0.95  LATICACIDVEN  --  0.8  --   --   --     Estimated Creatinine Clearance: 45 mL/min (by C-G formula based on SCr of 0.95 mg/dL).    Allergies  Allergen Reactions  . Statins Other (See Comments)    Antimicrobials this admission: 1/10 remdesivir >>1/14 1/12 Rocephin >> 1/14 1/15 cefazolin >>  Dose adjustments this admission: none  Microbiology results: 1/10 UCx: >100K Proteus mirabilis (S cefaz, ctx, gent, unasyn, zosyn) 1/10 BCx: ngtd 1/11 MRSA PCR neg  Thank you for allowing pharmacy to be a part of this patient's care.  Peggyann Juba, PharmD, BCPS Pharmacy: [phone] 02/09/2019 6:37 PM

## 2019-02-09 NOTE — Progress Notes (Signed)
PROGRESS NOTE    Kristen Mason  [id number] DOB: 11/19/1934 DOA: 02/05/2019 PCP: Wenda Low, MD   Brief Narrative:  86 year old female with history of Parkinson's disease, profound resting tremor, hypertension, paroxysmal [name]. fib on anticoagulation, type 2 diabetes, hyperlipidemia, recent right traumatic femur fracture, right kidney cancer, debility sent from nursing home with diagnosis of COVID-19 on 02/02/2019 and found to be short of breath and hypoxic on mobility. In the emergency room, hemodynamically stable.  Temperature 100.9.  She was saturating 99% on 2 L.  Chest x-ray shows no obvious consolidations.  Assessment & Plan:   Active Problems:   Controlled diabetes mellitus ([place])   Essential hypertension   Parkinson's disease ([place])   Essential tremor   COVID-19   COVID-19 virus infection   Acute lower UTI   Pressure injury of skin  COVID-19 infection with hypoxia: Most of the pulmonary symptoms improved.   Currently on RA - feeling better Supplemental oxygen to keep saturations more than 90%. Continue steroids and remdesivir I/O, daily weights IS, OOB, prone as able  COVID-19 Labs  Recent Labs    02/07/19 0600 02/08/19 0920 02/09/19 0515  DDIMER 0.75* 0.82* 0.68*  FERRITIN 217 225 225  CRP 7.7* 6.4* 4.2*    Lab Results  Component Value Date   SARSCOV2NAA NEGATIVE 11/09/2018   [place] NEGATIVE 11/05/2018   [place] NEGATIVE 10/31/2018    Type 2 diabetes: Cover with insulin.  Paroxysmal Garald Rhew. fib: Rate controlled.  On Eliquis.  Metoprolol on hold due to bradycardia.  May not need it.  Parkinson's disease as well as resting tremor: Profound disease.  On Sinemet that she will continue.  Renal mass concerning for carcinoma: Diagnosed 2 months ago.  Patient has profound debility, she will not be able to tolerate surgical resection.  Now with Covid. I told patient's daughter that if patient dramatically improves and do have good mobility, they may  consider doing biopsy, however with current functional status may just need to monitor.  Acute UTI present on admission: Reported symptoms of urinary frequency on presentation.  Cultures with proteus.  Start Rocephin.  Can change to oral antibiotics on discharge.  Thrombocytopenia: stable since admission, follow outpatient  Pressure Ulcer Pressure Injury 02/06/19 Buttocks Left;Right Stage 1 -  Intact skin with non-blanchable redness of [name] localized area usually over [name] bony prominence. (Active)  02/06/19 0500  Location: Buttocks  Location Orientation: Left;Right  Staging: Stage 1 -  Intact skin with non-blanchable redness of [name] localized area usually over [name] bony prominence.  Wound Description (Comments):   Present on Admission: Yes     Pressure Injury 02/08/19 Coccyx Stage 2 -  Partial thickness loss of dermis presenting as [name] shallow open injury with [name] red, pink wound bed without slough. (Active)  02/08/19 2010  Location: Coccyx  Location Orientation:   Staging: Stage 2 -  Partial thickness loss of dermis presenting as [name] shallow open injury with [name] red, pink wound bed without slough.  Wound Description (Comments):   Present on Admission: No   DVT prophylaxis: eliquis Code Status: full  Family Communication: none at bedside - called duaghter Disposition Plan: pending further improvement  Consultants:   none  Procedures:   none  Antimicrobials: Anti-infectives (From admission, onward)   Start     Dose/Rate Route Frequency Ordered Stop   02/07/19 1330  cefTRIAXone (ROCEPHIN) 1 g in sodium chloride 0.9 % 100 mL IVPB     1 g 200 mL/hr over 30 Minutes Intravenous Every 24  hours 02/07/19 1211     02/07/19 1000  remdesivir 100 mg in sodium chloride 0.9 % 100 mL IVPB  Status:  Discontinued     100 mg 200 mL/hr over 30 Minutes Intravenous Daily 02/06/19 0112 02/06/19 0115   02/06/19 1800  remdesivir 100 mg in sodium chloride 0.9 % 100 mL IVPB  Status:  Discontinued     100 mg 200  mL/hr over 30 Minutes Intravenous Daily 02/05/19 1727 02/06/19 0115   02/06/19 1800  remdesivir 100 mg in sodium chloride 0.9 % 100 mL IVPB     100 mg 200 mL/hr over 30 Minutes Intravenous Daily-1800 02/06/19 0116 02/10/19 1759   02/06/19 0115  remdesivir 200 mg in sodium chloride 0.9% 250 mL IVPB  Status:  Discontinued     200 mg 580 mL/hr over 30 Minutes Intravenous Once 02/06/19 0112 02/06/19 0115   02/05/19 1800  remdesivir 200 mg in sodium chloride 0.9% 250 mL IVPB     200 mg 580 mL/hr over 30 Minutes Intravenous Once 02/05/19 1727 02/05/19 1846     Subjective: Feeling better today  Objective: Vitals:   02/08/19 1601 02/08/19 2004 02/09/19 0346 02/09/19 0900  BP: (!) 129/51 (!) 126/51 (!) 133/48 (!) 139/57  Pulse: (!) 54 (!) 58 (!) 52   Resp: 20 16 16 18   Temp: (!) 97.5 F (36.4 C) 97.8 F (36.6 C) (!) 97.3 F (36.3 C) 98 F (36.7 C)  TempSrc: Oral Oral Oral Oral  SpO2: 100% 100% 99% 99%  Weight:      Height:        Intake/Output Summary (Last 24 hours) at 02/09/2019 1824 Last data filed at 02/09/2019 1254 Gross per 24 hour  Intake 1260 ml  Output 500 ml  Net 760 ml   Filed Weights   02/06/19 0500 02/06/19 0509  Weight: 70.8 kg 70.4 kg    Examination:  General: No acute distress. Cardiovascular: Heart sounds show [name] regular rate, and rhythm. No gallops or rubs. No murmurs. No JVD. Lungs: Clear to auscultation bilaterally  Abdomen: Soft, nontender, nondistended  Neurological: Alert and oriented 3. Moves all extremities 4 . Cranial nerves II through XII grossly intact. Skin: stage 2 pressure ulcer Extremities: No clubbing or cyanosis. No edema.   Data Reviewed: I have personally reviewed following labs and imaging studies  CBC: Recent Labs  Lab 02/05/19 1303 02/07/19 0600 02/08/19 0920 02/09/19 0515  WBC 3.3* 2.1* 1.8* 3.3*  NEUTROABS 2.0 1.4* 0.9* 2.5  HGB 11.9* 11.0* 11.2* 10.1*  HCT 37.9 35.0* 35.8* 32.5*  MCV 100.5* 98.9 98.9 98.8  PLT 113*  103* 110* [id number]*   Basic Metabolic Panel: Recent Labs  Lab 02/05/19 1303 02/05/19 1519 02/07/19 0600 02/08/19 0920 02/09/19 0515  NA 140  --  138 142 140  K 3.6  --  3.9 3.7 4.4  CL 105  --  103 102 103  CO2 26  --  24 29 29   GLUCOSE 142*  --  131* 175* 187*  BUN 23  --  21 22 24*  CREATININE 1.15*  --  0.98 1.02* 0.95  CALCIUM 8.6*  --  8.0* 8.2* 8.1*  MG  --  1.8 1.9 1.8 2.0  PHOS  --   --  3.0 3.5 3.3   GFR: Estimated Creatinine Clearance: 45 mL/min (by C-G formula based on SCr of 0.95 mg/dL). Liver Function Tests: Recent Labs  Lab 02/05/19 1303 02/07/19 0600 02/08/19 0920 02/09/19 0515  AST 16 14* 18 16  ALT 6 5 6 6   ALKPHOS 106 84 90 87  BILITOT 0.5 0.5 0.5 0.4  PROT 6.8 6.4* 6.9 6.5  ALBUMIN 3.3* 3.2* 3.4* 3.3*   Recent Labs  Lab 02/05/19 1303  LIPASE 24   No results for input(s): AMMONIA in the last 168 hours. Coagulation Profile: No results for input(s): INR, PROTIME in the last 168 hours. Cardiac Enzymes: No results for input(s): CKTOTAL, CKMB, CKMBINDEX, TROPONINI in the last 168 hours. BNP (last 3 results) No results for input(s): PROBNP in the last 8760 hours. HbA1C: No results for input(s): HGBA1C in the last 72 hours. CBG: Recent Labs  Lab 02/08/19 1138 02/08/19 2125 02/09/19 0755 02/09/19 1143 02/09/19 1639  GLUCAP 138* 158* 169* 153* 146*   Lipid Profile: No results for input(s): CHOL, HDL, LDLCALC, TRIG, CHOLHDL, LDLDIRECT in the last 72 hours. Thyroid Function Tests: No results for input(s): TSH, T4TOTAL, FREET4, T3FREE, THYROIDAB in the last 72 hours. Anemia Panel: Recent Labs    02/08/19 0920 02/09/19 0515  FERRITIN 225 225   Sepsis Labs: Recent Labs  Lab 02/05/19 1305  LATICACIDVEN 0.8    Recent Results (from the past 240 hour(s))  Blood culture (routine x 2)     Status: None (Preliminary result)   Collection Time: 02/05/19  1:05 PM   Specimen: BLOOD LEFT FOREARM  Result Value Ref Range Status   Specimen  Description BLOOD LEFT FOREARM  Final   Special Requests   Final    BOTTLES DRAWN AEROBIC AND ANAEROBIC Blood Culture results may not be optimal due to an inadequate volume of blood received in culture bottles   Culture   Final    NO GROWTH 4 DAYS Performed at [facility], [place] [address] [place], [place] [address]    Report Status PENDING  Incomplete  Blood culture (routine x 2)     Status: None (Preliminary result)   Collection Time: 02/05/19  3:19 PM   Specimen: BLOOD RIGHT HAND  Result Value Ref Range Status   Specimen Description BLOOD RIGHT HAND  Final   Special Requests   Final    BOTTLES DRAWN AEROBIC AND ANAEROBIC Blood Culture adequate volume   Culture   Final    NO GROWTH 4 DAYS Performed at [facility], [place] [address] [place], [place] [address]    Report Status PENDING  Incomplete  Urine culture     Status: Abnormal   Collection Time: 02/05/19  6:20 PM   Specimen: Urine, Clean Catch  Result Value Ref Range Status   Specimen Description URINE, CLEAN CATCH  Final   Special Requests   Final    NONE Performed at [facility], [place] [address], [place],  [address]    Culture >=100,000 COLONIES/mL PROTEUS MIRABILIS (Atia Haupt)  Final   Report Status 02/07/2019 FINAL  Final   Organism ID, Bacteria PROTEUS MIRABILIS (Cadel Stairs)  Final      Susceptibility   Proteus mirabilis - MIC*    AMPICILLIN >=32 RESISTANT Resistant     CEFAZOLIN <=4 SENSITIVE Sensitive     CEFTRIAXONE <=0.25 SENSITIVE Sensitive     CIPROFLOXACIN >=4 RESISTANT Resistant     GENTAMICIN <=1 SENSITIVE Sensitive     IMIPENEM 8 INTERMEDIATE Intermediate     NITROFURANTOIN 128 RESISTANT Resistant     TRIMETH/SULFA >=320 RESISTANT Resistant     AMPICILLIN/SULBACTAM 8 SENSITIVE Sensitive     PIP/TAZO <=4 SENSITIVE Sensitive     * >=100,000 COLONIES/mL PROTEUS MIRABILIS  MRSA PCR Screening     Status: None   Collection Time: 02/06/19  4:48 AM   Specimen: Nasal Mucosa; Nasopharyngeal  Result  Value Ref Range Status   MRSA by PCR NEGATIVE NEGATIVE Final    Comment:        The GeneXpert MRSA Assay (FDA approved for NASAL specimens only), is one component of Kellianne Ek comprehensive MRSA colonization surveillance program. It is not intended to diagnose MRSA infection nor to guide or monitor treatment for MRSA infections. Performed at [facility], [place] [address], [place],  [address]          Radiology Studies: No results found.      Scheduled Meds: . albuterol  2 puff Inhalation Q6H  . amLODipine  5 mg Oral Daily  . apixaban  5 mg Oral BID  . ascorbic acid  500 mg Oral Daily  . aspirin EC  81 mg Oral Daily  . carbidopa-levodopa  1 tablet Oral TID  . cholecalciferol  2,000 Units Oral Daily  . citalopram  40 mg Oral Daily  . clonazePAM  1 mg Oral QHS  . dexamethasone (DECADRON) injection  6 mg Intravenous Q24H  . dicyclomine  10 mg Oral TID PC & HS  . docusate sodium  100 mg Oral BID  . ferrous sulfate  325 mg Oral Q breakfast  . gabapentin  800 mg Oral TID  . hydrOXYzine  25 mg Oral QHS  . insulin aspart  0-9 Units Subcutaneous TID WC  . loratadine  10 mg Oral Daily  . mouth rinse  15 mL Mouth Rinse BID  . pantoprazole  40 mg Oral Daily  . polyvinyl alcohol  1 drop Both Eyes QID  . primidone  250 mg Oral BID  . rosuvastatin  5 mg Oral QPM  . zinc sulfate  220 mg Oral Daily   Continuous Infusions: . cefTRIAXone (ROCEPHIN)  IV 1 g (02/09/19 1243)  . remdesivir 100 mg in NS 100 mL 100 mg (02/09/19 1805)     LOS: 4 days    Time spent: over 28 min    Fayrene Helper, MD [facility] Pager [email or web address]  If 7PM-7AM, please contact night-coverage [email or web address] Password [facility] 02/09/2019, 6:24 PM

## 2019-02-10 LAB — GLUCOSE, CAPILLARY
Glucose-Capillary: 157 mg/dL — ABNORMAL HIGH (ref 70–99)
Glucose-Capillary: 178 mg/dL — ABNORMAL HIGH (ref 70–99)
Glucose-Capillary: 176 mg/dL — ABNORMAL HIGH (ref 70–99)
Glucose-Capillary: 126 mg/dL — ABNORMAL HIGH (ref 70–99)
Glucose-Capillary: 97 mg/dL (ref 70–99)

## 2019-02-10 LAB — CBC WITH DIFFERENTIAL/PLATELET
Abs Immature Granulocytes: 0.01 10*3/uL (ref 0.00–0.07)
Basophils Absolute: 0 10*3/uL (ref 0.0–0.1)
Basophils Relative: 0 %
Eosinophils Absolute: 0.1 10*3/uL (ref 0.0–0.5)
Eosinophils Relative: 2 %
HCT: 31.4 % — ABNORMAL LOW (ref 36.0–46.0)
Hemoglobin: 9.8 g/dL — ABNORMAL LOW (ref 12.0–15.0)
Immature Granulocytes: 0 %
Lymphocytes Relative: 29 %
Lymphs Abs: 0.9 10*3/uL (ref 0.7–4.0)
MCH: 30.9 pg (ref 26.0–34.0)
MCHC: 31.2 g/dL (ref 30.0–36.0)
MCV: 99.1 fL (ref 80.0–100.0)
Monocytes Absolute: 0.2 10*3/uL (ref 0.1–1.0)
Monocytes Relative: 6 %
Neutro Abs: 1.9 10*3/uL (ref 1.7–7.7)
Neutrophils Relative %: 63 %
Platelets: 115 10*3/uL — ABNORMAL LOW (ref 150–400)
RBC: 3.17 MIL/uL — ABNORMAL LOW (ref 3.87–5.11)
RDW: 14.9 % (ref 11.5–15.5)
WBC: 3 10*3/uL — ABNORMAL LOW (ref 4.0–10.5)
nRBC: 0 % (ref 0.0–0.2)

## 2019-02-10 LAB — COMPREHENSIVE METABOLIC PANEL
ALT: 5 U/L (ref 0–44)
AST: 15 U/L (ref 15–41)
Albumin: 3.2 g/dL — ABNORMAL LOW (ref 3.5–5.0)
Alkaline Phosphatase: 86 U/L (ref 38–126)
Anion gap: 10 (ref 5–15)
BUN: 21 mg/dL (ref 8–23)
CO2: 26 mmol/L (ref 22–32)
Calcium: 7.9 mg/dL — ABNORMAL LOW (ref 8.9–10.3)
Chloride: 105 mmol/L (ref 98–111)
Creatinine, Ser: 0.82 mg/dL (ref 0.44–1.00)
GFR calc Af Amer: >60 mL/min (ref >60)
GFR calc non Af Amer: >60 mL/min (ref >60)
Glucose, Bld: 157 mg/dL — ABNORMAL HIGH (ref 70–99)
Potassium: 3.9 mmol/L (ref 3.5–5.1)
Sodium: 141 mmol/L (ref 135–145)
Total Bilirubin: 0.2 mg/dL — ABNORMAL LOW (ref 0.3–1.2)
Total Protein: 6.2 g/dL — ABNORMAL LOW (ref 6.5–8.1)

## 2019-02-10 LAB — FERRITIN: Ferritin: 218 ng/mL (ref 11–307)

## 2019-02-10 LAB — C-REACTIVE PROTEIN: CRP: 2.7 mg/dL — ABNORMAL HIGH (ref <1.0)

## 2019-02-10 LAB — MAGNESIUM: Magnesium: 1.8 mg/dL (ref 1.7–2.4)

## 2019-02-10 LAB — D-DIMER, QUANTITATIVE: D-Dimer, Quant: 0.52 ug/mL-FEU — ABNORMAL HIGH (ref 0.00–0.50)

## 2019-02-10 LAB — PHOSPHORUS: Phosphorus: 3.4 mg/dL (ref 2.5–4.6)

## 2019-02-10 NOTE — Progress Notes (Signed)
PROGRESS NOTE    Sophiana Scalisi  [id number] DOB: March 31, 1934 DOA: 02/05/2019 PCP: Wenda Low, MD   Brief Narrative:  85 year old female with history of Parkinson's disease, profound resting tremor, hypertension, paroxysmal Lena Fieldhouse. fib on anticoagulation, type 2 diabetes, hyperlipidemia, recent right traumatic femur fracture, right kidney cancer, debility sent from nursing home with diagnosis of COVID-19 on 02/02/2019 and found to be short of breath and hypoxic on mobility. In the emergency room, hemodynamically stable.  Temperature 100.9.  She was saturating 99% on 2 L.  Chest x-ray shows no obvious consolidations.  Assessment & Plan:   Active Problems:   Controlled diabetes mellitus ([place])   Essential hypertension   Parkinson's disease ([place])   Essential tremor   COVID-19   COVID-19 virus infection   Acute lower UTI   Pressure injury of skin  COVID-19 infection with hypoxia: Most of the pulmonary symptoms improved.   Currently on RA - feeling better Supplemental oxygen to keep saturations more than 90%. Continue steroids and remdesivir I/O, daily weights IS, OOB, prone as able  COVID-19 Labs  Recent Labs    02/08/19 0920 02/09/19 0515 02/10/19 0430  DDIMER 0.82* 0.68* 0.52*  FERRITIN 225 225 218  CRP 6.4* 4.2* 2.7*    Lab Results  Component Value Date   SARSCOV2NAA NEGATIVE 11/09/2018   [place] NEGATIVE 11/05/2018   [place] NEGATIVE 10/31/2018    Type 2 diabetes: Cover with insulin.  Paroxysmal Gia Lusher. fib: Rate controlled.  On Eliquis.  Metoprolol on hold due to bradycardia.  May not need it.  Parkinson's disease as well as resting tremor: Profound disease.  On Sinemet that she will continue.  Renal mass concerning for carcinoma: Diagnosed 2 months ago.  Patient has profound debility, she will not be able to tolerate surgical resection.  Now with Covid. I told patient's daughter that if patient dramatically improves and do have good mobility, they may  consider doing biopsy, however with current functional status may just need to monitor.  Acute UTI present on admission: Reported symptoms of urinary frequency on presentation.  Cultures with proteus.  Start Rocephin.  Can change to oral antibiotics on discharge.  Thrombocytopenia: stable since admission, follow outpatient  Pressure Ulcer Pressure Injury 02/06/19 Buttocks Left;Right Stage 1 -  Intact skin with non-blanchable redness of Josselyn Harkins localized area usually over Thi Klich bony prominence. (Active)  02/06/19 0500  Location: Buttocks  Location Orientation: Left;Right  Staging: Stage 1 -  Intact skin with non-blanchable redness of Aretta Stetzel localized area usually over Trevor Duty bony prominence.  Wound Description (Comments):   Present on Admission: Yes     Pressure Injury 02/08/19 Coccyx Stage 2 -  Partial thickness loss of dermis presenting as Merion Grimaldo shallow open injury with Givanni Staron red, pink wound bed without slough. (Active)  02/08/19 2010  Location: Coccyx  Location Orientation:   Staging: Stage 2 -  Partial thickness loss of dermis presenting as Denham Mose shallow open injury with Caius Silbernagel red, pink wound bed without slough.  Wound Description (Comments):   Present on Admission: No   DVT prophylaxis: eliquis Code Status: full  Family Communication: none at bedside - called duaghter Disposition Plan: pending further improvement  Consultants:   none  Procedures:   none  Antimicrobials: Anti-infectives (From admission, onward)   Start     Dose/Rate Route Frequency Ordered Stop   02/10/19 1200  ceFAZolin (ANCEF) IVPB 1 g/50 mL premix     1 g 100 mL/hr over 30 Minutes Intravenous Every 8 hours 02/09/19 1834  02/07/19 1330  cefTRIAXone (ROCEPHIN) 1 g in sodium chloride 0.9 % 100 mL IVPB  Status:  Discontinued     1 g 200 mL/hr over 30 Minutes Intravenous Every 24 hours 02/07/19 1211 02/09/19 1825   02/07/19 1000  remdesivir 100 mg in sodium chloride 0.9 % 100 mL IVPB  Status:  Discontinued     100 mg 200 mL/hr over  30 Minutes Intravenous Daily 02/06/19 0112 02/06/19 0115   02/06/19 1800  remdesivir 100 mg in sodium chloride 0.9 % 100 mL IVPB  Status:  Discontinued     100 mg 200 mL/hr over 30 Minutes Intravenous Daily 02/05/19 1727 02/06/19 0115   02/06/19 1800  remdesivir 100 mg in sodium chloride 0.9 % 100 mL IVPB     100 mg 200 mL/hr over 30 Minutes Intravenous Daily-1800 02/06/19 0116 02/09/19 1835   02/06/19 0115  remdesivir 200 mg in sodium chloride 0.9% 250 mL IVPB  Status:  Discontinued     200 mg 580 mL/hr over 30 Minutes Intravenous Once 02/06/19 0112 02/06/19 0115   02/05/19 1800  remdesivir 200 mg in sodium chloride 0.9% 250 mL IVPB     200 mg 580 mL/hr over 30 Minutes Intravenous Once 02/05/19 1727 02/05/19 1846     Subjective: No complaints other than being cold  Objective: Vitals:   02/09/19 0900 02/09/19 1600 02/09/19 2026 02/10/19 0433  BP: (!) 139/57 132/60 (!) 124/47 (!) 142/43  Pulse:   (!) 51 (!) 53  Resp: 18 20 18 16   Temp: 98 F (36.7 C) 98 F (36.7 C) 98 F (36.7 C) 97.7 F (36.5 C)  TempSrc: Oral Oral Oral Oral  SpO2: 99% 98% 100% 93%  Weight:      Height:        Intake/Output Summary (Last 24 hours) at 02/10/2019 1616 Last data filed at 02/10/2019 0557 Gross per 24 hour  Intake 240 ml  Output 350 ml  Net -110 ml   Filed Weights   02/06/19 0500 02/06/19 0509  Weight: 70.8 kg 70.4 kg    Examination:  General: No acute distress. Cardiovascular: Heart sounds show Aylan Bayona regular rate, and rhythm.  Lungs: Clear to auscultation bilaterally Abdomen: Soft, nontender, nondistended  Neurological: Alert and oriented 3. Moves all extremities 4 . Cranial nerves II through XII grossly intact. Skin: Warm and dry. No rashes or lesions. Extremities: No clubbing or cyanosis. No edema.   Data Reviewed: I have personally reviewed following labs and imaging studies  CBC: Recent Labs  Lab 02/05/19 1303 02/07/19 0600 02/08/19 0920 02/09/19 0515 02/10/19 0430  WBC  3.3* 2.1* 1.8* 3.3* 3.0*  NEUTROABS 2.0 1.4* 0.9* 2.5 1.9  HGB 11.9* 11.0* 11.2* 10.1* 9.8*  HCT 37.9 35.0* 35.8* 32.5* 31.4*  MCV 100.5* 98.9 98.9 98.8 99.1  PLT 113* 103* 110* 113* [id number]*   Basic Metabolic Panel: Recent Labs  Lab 02/05/19 1303 02/05/19 1519 02/07/19 0600 02/08/19 0920 02/09/19 0515 02/10/19 0430  NA 140  --  138 142 140 141  K 3.6  --  3.9 3.7 4.4 3.9  CL 105  --  103 102 103 105  CO2 26  --  24 29 29 26   GLUCOSE 142*  --  131* 175* 187* 157*  BUN 23  --  21 22 24* 21  CREATININE 1.15*  --  0.98 1.02* 0.95 0.82  CALCIUM 8.6*  --  8.0* 8.2* 8.1* 7.9*  MG  --  1.8 1.9 1.8 2.0 1.8  PHOS  --   --  3.0 3.5 3.3 3.4   GFR: Estimated Creatinine Clearance: 52.1 mL/min (by C-G formula based on SCr of 0.82 mg/dL). Liver Function Tests: Recent Labs  Lab 02/05/19 1303 02/07/19 0600 02/08/19 0920 02/09/19 0515 02/10/19 0430  AST 16 14* 18 16 15   ALT 6 5 6 6 5   ALKPHOS 106 84 90 87 86  BILITOT 0.5 0.5 0.5 0.4 0.2*  PROT 6.8 6.4* 6.9 6.5 6.2*  ALBUMIN 3.3* 3.2* 3.4* 3.3* 3.2*   Recent Labs  Lab 02/05/19 1303  LIPASE 24   No results for input(s): AMMONIA in the last 168 hours. Coagulation Profile: No results for input(s): INR, PROTIME in the last 168 hours. Cardiac Enzymes: No results for input(s): CKTOTAL, CKMB, CKMBINDEX, TROPONINI in the last 168 hours. BNP (last 3 results) No results for input(s): PROBNP in the last 8760 hours. HbA1C: No results for input(s): HGBA1C in the last 72 hours. CBG: Recent Labs  Lab 02/09/19 1639 02/09/19 2147 02/10/19 0623 02/10/19 0802 02/10/19 1206  GLUCAP 146* 157* 178* 176* 126*   Lipid Profile: No results for input(s): CHOL, HDL, LDLCALC, TRIG, CHOLHDL, LDLDIRECT in the last 72 hours. Thyroid Function Tests: No results for input(s): TSH, T4TOTAL, FREET4, T3FREE, THYROIDAB in the last 72 hours. Anemia Panel: Recent Labs    02/09/19 0515 02/10/19 0430  FERRITIN 225 218   Sepsis Labs: Recent Labs  Lab  02/05/19 1305  LATICACIDVEN 0.8    Recent Results (from the past 240 hour(s))  Blood culture (routine x 2)     Status: None (Preliminary result)   Collection Time: 02/05/19  1:05 PM   Specimen: BLOOD LEFT FOREARM  Result Value Ref Range Status   Specimen Description BLOOD LEFT FOREARM  Final   Special Requests   Final    BOTTLES DRAWN AEROBIC AND ANAEROBIC Blood Culture results may not be optimal due to an inadequate volume of blood received in culture bottles   Culture   Final    NO GROWTH 4 DAYS Performed at [facility], [place] [address] [place], [place] [address]    Report Status PENDING  Incomplete  Blood culture (routine x 2)     Status: None (Preliminary result)   Collection Time: 02/05/19  3:19 PM   Specimen: BLOOD RIGHT HAND  Result Value Ref Range Status   Specimen Description BLOOD RIGHT HAND  Final   Special Requests   Final    BOTTLES DRAWN AEROBIC AND ANAEROBIC Blood Culture adequate volume   Culture   Final    NO GROWTH 4 DAYS Performed at [facility], [place] [address], [place], [place] [address]    Report Status PENDING  Incomplete  Urine culture     Status: Abnormal   Collection Time: 02/05/19  6:20 PM   Specimen: Urine, Clean Catch  Result Value Ref Range Status   Specimen Description URINE, CLEAN CATCH  Final   Special Requests   Final    NONE Performed at [facility], [place] [address] [place],  [address]    Culture >=100,000 COLONIES/mL PROTEUS MIRABILIS (Zackarie Chason)  Final   Report Status 02/07/2019 FINAL  Final   Organism ID, Bacteria PROTEUS MIRABILIS (Dede Dobesh)  Final      Susceptibility   Proteus mirabilis - MIC*    AMPICILLIN >=32 RESISTANT Resistant     CEFAZOLIN <=4 SENSITIVE Sensitive     CEFTRIAXONE <=0.25 SENSITIVE Sensitive     CIPROFLOXACIN >=4 RESISTANT Resistant     GENTAMICIN <=1 SENSITIVE  Sensitive     IMIPENEM 8 INTERMEDIATE Intermediate     NITROFURANTOIN 128 RESISTANT Resistant     TRIMETH/SULFA >=320 RESISTANT  Resistant     AMPICILLIN/SULBACTAM 8 SENSITIVE Sensitive     PIP/TAZO <=4 SENSITIVE Sensitive     * >=100,000 COLONIES/mL PROTEUS MIRABILIS  MRSA PCR Screening     Status: None   Collection Time: 02/06/19  4:48 AM   Specimen: Nasal Mucosa; Nasopharyngeal  Result Value Ref Range Status   MRSA by PCR NEGATIVE NEGATIVE Final    Comment:        The GeneXpert MRSA Assay (FDA approved for NASAL specimens only), is one component of Jovoni Borkenhagen comprehensive MRSA colonization surveillance program. It is not intended to diagnose MRSA infection nor to guide or monitor treatment for MRSA infections. Performed at [facility], [place] [address], [place], [place] [address]          Radiology Studies: No results found.      Scheduled Meds: . albuterol  2 puff Inhalation Q6H  . amLODipine  5 mg Oral Daily  . apixaban  5 mg Oral BID  . ascorbic acid  500 mg Oral Daily  . aspirin EC  81 mg Oral Daily  . carbidopa-levodopa  1 tablet Oral TID  . cholecalciferol  2,000 Units Oral Daily  . citalopram  40 mg Oral Daily  . clonazePAM  1 mg Oral QHS  . dexamethasone (DECADRON) injection  6 mg Intravenous Q24H  . dicyclomine  10 mg Oral TID PC & HS  . docusate sodium  100 mg Oral BID  . ferrous sulfate  325 mg Oral Q breakfast  . gabapentin  800 mg Oral TID  . hydrOXYzine  25 mg Oral QHS  . insulin aspart  0-9 Units Subcutaneous TID WC  . loratadine  10 mg Oral Daily  . mouth rinse  15 mL Mouth Rinse BID  . pantoprazole  40 mg Oral Daily  . polyvinyl alcohol  1 drop Both Eyes QID  . primidone  250 mg Oral BID  . rosuvastatin  5 mg Oral QPM  . zinc sulfate  220 mg Oral Daily   Continuous Infusions: .  ceFAZolin (ANCEF) IV 1 g (02/10/19 1500)     LOS: 5 days    Time spent: over 30 min    Fayrene Helper, MD [facility] Pager [email or web address]  If 7PM-7AM, please contact night-coverage [email or web address] Password [email or web address] 02/10/2019, 4:16 PM

## 2019-02-10 NOTE — Progress Notes (Signed)
Physical Therapy Treatment Patient Details Name: Kristen Mason MRN: [id number] DOB: [birth year] Today's Date: 02/10/2019    History of Present Illness 85 year old female with history of Parkinson's disease, profound resting tremor, hypertension, paroxysmal A. fib on anticoagulation, type 2 diabetes, hyperlipidemia, recent right traumatic femur fracture, right kidney cancer, debility sent from nursing home with diagnosis of COVID-19 on 02/02/2019 and found to be short of breath and hypoxic on mobility.In the emergency room, hemodynamically stable.  Temperature 100.9.  She was saturating 99% on 2 L.  Chest x-ray shows no obvious consolidations.    PT Comments     The patient mobilized to [facility] with min assist. Stood to be washed up and assisted back into bed. Plan to ambulate with patient next visit. Patient plans to return to SNF.   Follow Up Recommendations  SNF     Equipment Recommendations    none   Recommendations for Other Services       Precautions / Restrictions Precautions Precautions: Fall Precaution Comments: wears depends    Mobility  Bed Mobility   Bed Mobility: Supine to Sit     Supine to sit: HOB elevated;Min assist     General bed mobility comments: bed soaking and patient had difficulty, required assist with buttocks and trying to protect the L hand IV  Transfers Overall transfer level: Needs assistance Equipment used: Rolling walker (2 wheeled) Transfers: Sit to/from [facility] Sit to Stand: Min assist Stand pivot transfers: Min assist       General transfer comment: Patient assisted to stand from bed and pivot to [facility]. Stood from [facility] to get washed up, then took a few steps back to the bed.  poser up x 2 using RW, Assist to pull up briefs.  Ambulation/Gait                 Stairs             Wheelchair Mobility    Modified Rankin (Stroke Patients Only)       Balance                                             Cognition Arousal/Alertness: Awake/alert Behavior During Therapy: WFL for tasks assessed/performed                                          Exercises      General Comments        Pertinent Vitals/Pain      Home Living                      Prior Function            PT Goals (current goals can now be found in the care plan section) Progress towards PT goals: Progressing toward goals    Frequency    Min 2X/week      PT Plan Current plan remains appropriate    Co-evaluation              AM-PAC PT "6 Clicks" Mobility   Outcome Measure  Help needed turning from your back to your side while in a flat bed without using bedrails?: A Little Help needed moving from lying on your back to sitting on  the side of a flat bed without using bedrails?: A Little Help needed moving to and from a bed to a chair (including a wheelchair)?: A Little Help needed standing up from a chair using your arms (e.g., wheelchair or bedside chair)?: A Little Help needed to walk in hospital room?: A Lot Help needed climbing 3-5 steps with a railing? : Total 6 Click Score: 15    End of Session   Activity Tolerance: Patient tolerated treatment well Patient left: in bed;with call bell/phone within reach;with bed alarm set Nurse Communication: Mobility status PT Visit Diagnosis: Muscle weakness (generalized) (M62.81);History of falling (Z91.81);Difficulty in walking, not elsewhere classified (R26.2);Unsteadiness on feet (R26.81)     Time: [id number] PT Time Calculation (min) (ACUTE ONLY): 41 min  Charges:  $Therapeutic Activity: 23-37 mins $Self Care/Home Management: Velma Pager [phone] Office [phone]    Claretha Cooper 02/10/2019, 5:13 PM

## 2019-02-10 NOTE — TOC Progression Note (Signed)
Transition of Care [facility]) - Progression Note    Patient Details  Name: Kristen Mason MRN: [id number] Date of Birth: 09/14/1934  [facility] of [facility]) CM/SW Contact  Loletha Grayer Beverely Pace, RN Phone Number: 02/10/2019, 9:30 AM  Clinical Narrative: Case manager spoke with Tammy from [facility] concerning patient being able to return to facility on the weekend. She will have a bed. Weekend contact will be Barb Merino [phone].      Expected Discharge Plan: [place] Barriers to Discharge: Continued Medical Work up  Expected Discharge Plan and Services Expected Discharge Plan: [place]   Discharge Planning Services: CM Consult Post Acute Care Choice: Resumption of Svcs/PTA Provider Living arrangements for the past 2 months: [place]                 DME Arranged: N/A DME Agency: NA       HH Arranged: NA HH Agency: NA         Social Determinants of Health (SDOH) Interventions    Readmission Risk Interventions Readmission Risk Prevention Plan 11/03/2018  Transportation Screening Complete  PCP or Specialist Appt within 3-5 Days Complete  HRI or Home Care Consult Complete  Social Work Consult for [place] Planning/Counseling Complete  Palliative Care Screening Not Applicable  Medication Review Press photographer) Complete  Some recent data might be hidden

## 2019-02-11 DIAGNOSIS — J189 Pneumonia, unspecified organism: Secondary | ICD-10-CM | POA: Diagnosis not present

## 2019-02-11 DIAGNOSIS — R531 Weakness: Secondary | ICD-10-CM | POA: Diagnosis not present

## 2019-02-11 DIAGNOSIS — M6281 Muscle weakness (generalized): Secondary | ICD-10-CM | POA: Diagnosis present

## 2019-02-11 DIAGNOSIS — F411 Generalized anxiety disorder: Secondary | ICD-10-CM | POA: Diagnosis not present

## 2019-02-11 DIAGNOSIS — S72441D Displaced fracture of lower epiphysis (separation) of right femur, subsequent encounter for closed fracture with routine healing: Secondary | ICD-10-CM | POA: Diagnosis not present

## 2019-02-11 DIAGNOSIS — F331 Major depressive disorder, recurrent, moderate: Secondary | ICD-10-CM | POA: Diagnosis not present

## 2019-02-11 DIAGNOSIS — Z23 Encounter for immunization: Secondary | ICD-10-CM | POA: Diagnosis not present

## 2019-02-11 DIAGNOSIS — R001 Bradycardia, unspecified: Secondary | ICD-10-CM | POA: Diagnosis not present

## 2019-02-11 DIAGNOSIS — D500 Iron deficiency anemia secondary to blood loss (chronic): Secondary | ICD-10-CM | POA: Diagnosis present

## 2019-02-11 DIAGNOSIS — U071 COVID-19: Secondary | ICD-10-CM | POA: Diagnosis present

## 2019-02-11 DIAGNOSIS — M2550 Pain in unspecified joint: Secondary | ICD-10-CM | POA: Diagnosis not present

## 2019-02-11 DIAGNOSIS — G250 Essential tremor: Secondary | ICD-10-CM | POA: Diagnosis present

## 2019-02-11 DIAGNOSIS — I480 Paroxysmal atrial fibrillation: Secondary | ICD-10-CM | POA: Diagnosis present

## 2019-02-11 DIAGNOSIS — N390 Urinary tract infection, site not specified: Secondary | ICD-10-CM | POA: Diagnosis present

## 2019-02-11 DIAGNOSIS — B964 Proteus (mirabilis) (morganii) as the cause of diseases classified elsewhere: Secondary | ICD-10-CM | POA: Diagnosis present

## 2019-02-11 DIAGNOSIS — R5381 Other malaise: Secondary | ICD-10-CM | POA: Diagnosis not present

## 2019-02-11 DIAGNOSIS — R7989 Other specified abnormal findings of blood chemistry: Secondary | ICD-10-CM | POA: Diagnosis not present

## 2019-02-11 DIAGNOSIS — Z741 Need for assistance with personal care: Secondary | ICD-10-CM | POA: Diagnosis present

## 2019-02-11 DIAGNOSIS — Z7401 Bed confinement status: Secondary | ICD-10-CM | POA: Diagnosis not present

## 2019-02-11 DIAGNOSIS — R262 Difficulty in walking, not elsewhere classified: Secondary | ICD-10-CM | POA: Diagnosis present

## 2019-02-11 LAB — CBC WITH DIFFERENTIAL/PLATELET
Abs Immature Granulocytes: 0.02 10*3/uL (ref 0.00–0.07)
Basophils Absolute: 0 10*3/uL (ref 0.0–0.1)
Basophils Relative: 0 %
Eosinophils Absolute: 0 10*3/uL (ref 0.0–0.5)
Eosinophils Relative: 0 %
HCT: 33 % — ABNORMAL LOW (ref 36.0–46.0)
Hemoglobin: 10.7 g/dL — ABNORMAL LOW (ref 12.0–15.0)
Immature Granulocytes: 1 %
Lymphocytes Relative: 22 %
Lymphs Abs: 0.8 10*3/uL (ref 0.7–4.0)
MCH: 31.5 pg (ref 26.0–34.0)
MCHC: 32.4 g/dL (ref 30.0–36.0)
MCV: 97.1 fL (ref 80.0–100.0)
Monocytes Absolute: 0.1 10*3/uL (ref 0.1–1.0)
Monocytes Relative: 3 %
Neutro Abs: 2.7 10*3/uL (ref 1.7–7.7)
Neutrophils Relative %: 74 %
Platelets: 146 10*3/uL — ABNORMAL LOW (ref 150–400)
RBC: 3.4 MIL/uL — ABNORMAL LOW (ref 3.87–5.11)
RDW: 14.9 % (ref 11.5–15.5)
WBC: 3.7 10*3/uL — ABNORMAL LOW (ref 4.0–10.5)
nRBC: 0 % (ref 0.0–0.2)

## 2019-02-11 LAB — FERRITIN: Ferritin: 202 ng/mL (ref 11–307)

## 2019-02-11 LAB — COMPREHENSIVE METABOLIC PANEL
ALT: 8 U/L (ref 0–44)
AST: 17 U/L (ref 15–41)
Albumin: 3.4 g/dL — ABNORMAL LOW (ref 3.5–5.0)
Alkaline Phosphatase: 94 U/L (ref 38–126)
Anion gap: 9 (ref 5–15)
BUN: 22 mg/dL (ref 8–23)
CO2: 28 mmol/L (ref 22–32)
Calcium: 8.4 mg/dL — ABNORMAL LOW (ref 8.9–10.3)
Chloride: 102 mmol/L (ref 98–111)
Creatinine, Ser: 0.88 mg/dL (ref 0.44–1.00)
GFR calc Af Amer: >60 mL/min (ref >60)
GFR calc non Af Amer: >60 mL/min (ref >60)
Glucose, Bld: 198 mg/dL — ABNORMAL HIGH (ref 70–99)
Potassium: 4.5 mmol/L (ref 3.5–5.1)
Sodium: 139 mmol/L (ref 135–145)
Total Bilirubin: 0.3 mg/dL (ref 0.3–1.2)
Total Protein: 6.7 g/dL (ref 6.5–8.1)

## 2019-02-11 LAB — GLUCOSE, CAPILLARY
Glucose-Capillary: 136 mg/dL — ABNORMAL HIGH (ref 70–99)
Glucose-Capillary: 113 mg/dL — ABNORMAL HIGH (ref 70–99)
Glucose-Capillary: 102 mg/dL — ABNORMAL HIGH (ref 70–99)

## 2019-02-11 LAB — D-DIMER, QUANTITATIVE: D-Dimer, Quant: 0.55 ug/mL-FEU — ABNORMAL HIGH (ref 0.00–0.50)

## 2019-02-11 LAB — MAGNESIUM: Magnesium: 1.9 mg/dL (ref 1.7–2.4)

## 2019-02-11 LAB — PHOSPHORUS: Phosphorus: 3.7 mg/dL (ref 2.5–4.6)

## 2019-02-11 LAB — C-REACTIVE PROTEIN: CRP: 1.8 mg/dL — ABNORMAL HIGH (ref <1.0)

## 2019-02-11 MED ORDER — CEPHALEXIN 500 MG PO CAPS: 500.0000 mg | ORAL_CAPSULE | Freq: Three times a day (TID) | ORAL | 0 refills | Status: AC

## 2019-02-11 NOTE — Discharge Summary (Signed)
Physician Discharge Summary  Kristen Mason [id number] DOB: Mar 09, 1934 DOA: 02/05/2019  PCP: Wenda Low, MD  Admit date: 02/05/2019 Discharge date: 02/11/2019  Time spent: 40 minutes  Recommendations for Outpatient Follow-up:  1. Follow outpatient CBC/CMP 2. Quarantine per CDC guidelines - until 02/26/19 3. Discharged on keflex x3 days for UTI 4. Follow stage II decubitus ulcer, continue frequent turns and wound care at SNF 5. Follow up outpatient for renal mass when medically appropriate 6. Metoprolol d/c'd follow outpatient HR  Discharge Diagnoses:  Active Problems:   Controlled diabetes mellitus ([place])   Essential hypertension   Parkinson's disease ([place])   Essential tremor   COVID-19   COVID-19 virus infection   Acute lower UTI   Pressure injury of skin   Discharge Condition: stable  Diet recommendation: diabetic  Filed Weights   02/06/19 0500 02/06/19 0509  Weight: 70.8 kg 70.4 kg    History of present illness:  85 year old female with history of Parkinson's disease, profound resting tremor, hypertension, paroxysmal A. fib on anticoagulation, type 2 diabetes, hyperlipidemia, recent right traumatic femur fracture, right kidney cancer, debility sent from nursing home with diagnosis of COVID-19 on 02/02/2019 and found to be short of breath and hypoxic on mobility. In the emergency room, hemodynamically stable. Temperature 100.9. She was saturating 99% on 2 L. Chest x-ray shows no obvious consolidations.  She was admitted for coronavirus infection.  She received steroids and remdesivir.  She was treated for UTI as well while admitted.  See below for additional details  Hospital Course:  COVID-19 infection with hypoxia: Most of the pulmonary symptoms improved.  Currently on RA - feeling better Supplemental oxygen to keep saturations more than 90%. Continue steroids and remdesivir I/O, daily weights IS, OOB, prone as able  COVID-19 Labs  Recent Labs     02/09/19 0515 02/10/19 0430 02/11/19 0340  DDIMER 0.68* 0.52* 0.55*  FERRITIN 225 218 202  CRP 4.2* 2.7* 1.8*    Lab Results  Component Value Date   SARSCOV2NAA NEGATIVE 11/09/2018   [place] NEGATIVE 11/05/2018   [place] NEGATIVE 10/31/2018   Type 2 diabetes: resume home meds.  Paroxysmal A. [id number] controlled. On Eliquis. Metoprolol on holddue to bradycardia. Discontinued on discharge, follow HR outpatient.  Parkinson's disease as well as resting tremor:Profound disease. On Sinemet that she will continue.  Renal mass concerning for carcinoma:Diagnosed last year.  Discussed with daughter, will need to follow outpatient for this.  Acute UTI present on admission:Reported symptoms of urinary frequency on presentation. Cultures with proteus. Discharge on keflex.  Thrombocytopenia: stable since admission, follow outpatient.  Improving.  Pressure Ulcer - frequent turns and wound care Pressure Injury 02/06/19 Buttocks Left;Right Stage 1 -  Intact skin with non-blanchable redness of a localized area usually over a bony prominence. (Active)  02/06/19 0500  Location: Buttocks  Location Orientation: Left;Right  Staging: Stage 1 -  Intact skin with non-blanchable redness of a localized area usually over a bony prominence.  Wound Description (Comments):   Present on Admission: Yes     Pressure Injury 02/08/19 Coccyx Stage 2 -  Partial thickness loss of dermis presenting as a shallow open injury with a red, pink wound bed without slough. (Active)  02/08/19 2010  Location: Coccyx  Location Orientation:   Staging: Stage 2 -  Partial thickness loss of dermis presenting as a shallow open injury with a red, pink wound bed without slough.  Wound Description (Comments):   Present on Admission: No    Procedures: none  Consultations:  none  Discharge Exam: Vitals:   02/11/19 0400 02/11/19 0813  BP: (!) 128/42 129/69  Pulse: (!) 48 60  Resp: 20 18  Temp:  (!) 97.5 F (36.4 C) 98 F (36.7 C)  SpO2:  96%   Feels well Apprehensive about where she'll go in the SNF Discussed with daughter, discussed d/c POC  General: No acute distress. Cardiovascular: Heart sounds show a regular rate, and rhythm.  Lungs: Clear to auscultation bilaterally  Abdomen: Soft, nontender, nondistended  Neurological: Alert and oriented 3. Moves all extremities 4. Cranial nerves II through XII grossly intact. Skin: Warm and dry. No rashes or lesions. Extremities: No clubbing or cyanosis. No edema.  Discharge Instructions   Discharge Instructions    Call MD for:  difficulty breathing, headache or visual disturbances   Complete by: As directed    Call MD for:  extreme fatigue   Complete by: As directed    Call MD for:  hives   Complete by: As directed    Call MD for:  persistant dizziness or light-headedness   Complete by: As directed    Call MD for:  persistant nausea and vomiting   Complete by: As directed    Call MD for:  redness, tenderness, or signs of infection (pain, swelling, redness, odor or green/yellow discharge around incision site)   Complete by: As directed    Call MD for:  severe uncontrolled pain   Complete by: As directed    Call MD for:  temperature >100.4   Complete by: As directed    Diet - low sodium heart healthy   Complete by: As directed    Discharge instructions   Complete by: As directed    You were seen for COVID 19 infection.  You've improved with steroids and remdesivir.  You'll need to continue to quarantine for 21 days since your positive test.  This means your quarantine will continue until 02/26/19.  It can be discontinued after this if your symptoms continue to improve and you do not need to use any medications.  We stopped your metoprolol because your heart rate is on the slow side.  Follow this outpatient.  We're treating you for a UTI.  You have 3 more days of antibiotics.  Follow your decubitus ulcer as an  outpatient at the SNF.  This needs close follow up and continued wound care.  Please follow up with your PCP the renal mass you have as an outpatient.  Return for new, recurrent, or worsening symptoms.  Please ask your PCP to request records from this hospitalization so they know what was done and what the next steps will be.   Increase activity slowly   Complete by: As directed      Allergies as of 02/11/2019      Reactions   Statins Other (See Comments)      Medication List    STOP taking these medications   azithromycin 250 MG tablet Commonly known as: ZITHROMAX   dexamethasone 6 MG tablet Commonly known as: DECADRON   metoprolol tartrate 25 MG tablet Commonly known as: LOPRESSOR     TAKE these medications   acetaminophen 325 MG tablet Commonly known as: TYLENOL Take 2 tablets (650 mg total) by mouth every 6 (six) hours as needed for mild pain (or Fever >/= 101).   amLODipine 5 MG tablet Commonly known as: NORVASC Take 5 mg by mouth daily.   apixaban 5 MG Tabs tablet Commonly known as: ELIQUIS Take 1 tablet (  5 mg total) by mouth 2 (two) times daily.   ascorbic acid 500 MG tablet Commonly known as: VITAMIN C Take 1 tablet (500 mg total) by mouth daily. What changed: when to take this   aspirin EC 81 MG tablet Take 81 mg by mouth daily.   bisacodyl 10 MG suppository Commonly known as: DULCOLAX Place 1 suppository (10 mg total) rectally daily as needed for moderate constipation.   calcium citrate 950 (200 Ca) MG tablet Commonly known as: CALCITRATE - dosed in mg elemental calcium Take 1 tablet (200 mg of elemental calcium total) by mouth 2 (two) times daily.   carbidopa-levodopa 25-100 MG tablet Commonly known as: SINEMET IR Take 1 tablet by mouth 3 (three) times daily.   cephALEXin 500 MG capsule Commonly known as: KEFLEX Take 1 capsule (500 mg total) by mouth 3 (three) times daily for 3 days.   cetirizine 10 MG tablet Commonly known as: ZYRTEC Take  10 mg by mouth daily.   citalopram 40 MG tablet Commonly known as: CELEXA Take 40 mg by mouth daily.   clonazePAM 1 MG tablet Commonly known as: KLONOPIN Take 1 tablet (1 mg total) by mouth at bedtime.   CRANBERRY PO Take 1 tablet by mouth 2 (two) times daily.   dicyclomine 10 MG capsule Commonly known as: BENTYL Take 10 mg by mouth 4 (four) times daily - after meals and at bedtime.   docusate sodium 100 MG capsule Commonly known as: COLACE Take 1 capsule (100 mg total) by mouth 2 (two) times daily.   ferrous sulfate 325 (65 FE) MG tablet Take 1 tablet (325 mg total) by mouth daily with breakfast.   Fish Oil 1000 MG Cpdr Take 1,000 mg by mouth daily.   gabapentin 800 MG tablet Commonly known as: NEURONTIN Take 800 mg by mouth 3 (three) times daily.   glimepiride 1 MG tablet Commonly known as: AMARYL Take 1 mg by mouth daily.   hydrOXYzine 25 MG tablet Commonly known as: ATARAX/VISTARIL Take 25 mg by mouth at bedtime.   ondansetron 4 MG disintegrating tablet Commonly known as: ZOFRAN-ODT Take 1 tablet (4 mg total) by mouth every 8 (eight) hours as needed for nausea or vomiting.   pantoprazole 40 MG tablet Commonly known as: PROTONIX Take 40 mg by mouth daily.   primidone 250 MG tablet Commonly known as: MYSOLINE Take 250 mg by mouth 2 (two) times daily.   Refresh Tears 0.5 % Soln Generic drug: carboxymethylcellulose Place 1 drop into both eyes 4 (four) times daily.   rosuvastatin 5 MG tablet Commonly known as: CRESTOR Take 5 mg by mouth every evening.   traMADol 50 MG tablet Commonly known as: ULTRAM Take 50 mg by mouth every 6 (six) hours as needed for moderate pain.   Vitamin D (Ergocalciferol) 1.25 MG (50000 UNIT) Caps capsule Commonly known as: DRISDOL Take 1 capsule (50,000 Units total) by mouth every 7 (seven) days.   Vitamin D-3 125 MCG (5000 UT) Tabs Take 1 tablet by mouth daily.   Vitamin D3 25 MCG tablet Commonly known as: Vitamin  D Take 2 tablets (2,000 Units total) by mouth 2 (two) times daily.   Zinc Sulfate 220 (50 Zn) MG Tabs Take 1 tablet by mouth daily.      Allergies  Allergen Reactions  . Statins Other (See Comments)   Contact information for after-discharge care    Destination    [facility] .   Service: Skilled Nursing Contact information: [phone]  [place] [phone]               The results of significant diagnostics from this hospitalization (including imaging, microbiology, ancillary and laboratory) are listed below for reference.    Significant Diagnostic Studies: DG Chest Portable 1 View  Result Date: 02/05/2019 CLINICAL DATA:  Cough and hypoxia. COVID positive. EXAM: PORTABLE CHEST 1 VIEW COMPARISON:  Chest radiograph dated 06/07/2017 FINDINGS: The heart size is within normal limits. Vascular calcifications are seen in the aortic arch. Both lungs are clear. The visualized skeletal structures are unremarkable. IMPRESSION: No active cardiopulmonary disease. Aortic Atherosclerosis (ICD10-I70.0). Electronically Signed   By: Zerita Boers M.D.   On: 02/05/2019 13:26    Microbiology: Recent Results (from the past 240 hour(s))  Blood culture (routine x 2)     Status: None (Preliminary result)   Collection Time: 02/05/19  1:05 PM   Specimen: BLOOD LEFT FOREARM  Result Value Ref Range Status   Specimen Description BLOOD LEFT FOREARM  Final   Special Requests   Final    BOTTLES DRAWN AEROBIC AND ANAEROBIC Blood Culture results may not be optimal due to an inadequate volume of blood received in culture bottles   Culture   Final    NO GROWTH 4 DAYS Performed at [facility], [place] [address], [place], [place] [address]    Report Status PENDING  Incomplete  Blood culture (routine x 2)     Status: None (Preliminary result)   Collection Time: 02/05/19  3:19 PM   Specimen: BLOOD RIGHT HAND  Result Value Ref Range Status    Specimen Description BLOOD RIGHT HAND  Final   Special Requests   Final    BOTTLES DRAWN AEROBIC AND ANAEROBIC Blood Culture adequate volume   Culture   Final    NO GROWTH 4 DAYS Performed at [facility], [place] [address] [place], [place] [address]    Report Status PENDING  Incomplete  Urine culture     Status: Abnormal   Collection Time: 02/05/19  6:20 PM   Specimen: Urine, Clean Catch  Result Value Ref Range Status   Specimen Description URINE, CLEAN CATCH  Final   Special Requests   Final    NONE Performed at [facility], [place] [address], [place], [place] [address]    Culture >=100,000 COLONIES/mL PROTEUS MIRABILIS (A)  Final   Report Status 02/07/2019 FINAL  Final   Organism ID, Bacteria PROTEUS MIRABILIS (A)  Final      Susceptibility   Proteus mirabilis - MIC*    AMPICILLIN >=32 RESISTANT Resistant     CEFAZOLIN <=4 SENSITIVE Sensitive     CEFTRIAXONE <=0.25 SENSITIVE Sensitive     CIPROFLOXACIN >=4 RESISTANT Resistant     GENTAMICIN <=1 SENSITIVE Sensitive     IMIPENEM 8 INTERMEDIATE Intermediate     NITROFURANTOIN 128 RESISTANT Resistant     TRIMETH/SULFA >=320 RESISTANT Resistant     AMPICILLIN/SULBACTAM 8 SENSITIVE Sensitive     PIP/TAZO <=4 SENSITIVE Sensitive     * >=100,000 COLONIES/mL PROTEUS MIRABILIS  MRSA PCR Screening     Status: None   Collection Time: 02/06/19  4:48 AM   Specimen: Nasal Mucosa; Nasopharyngeal  Result Value Ref Range Status   MRSA by PCR NEGATIVE NEGATIVE Final    Comment:        The GeneXpert MRSA Assay (FDA approved for NASAL specimens only), is one component of a comprehensive MRSA colonization surveillance program. It  is not intended to diagnose MRSA infection nor to guide or monitor treatment for MRSA infections. Performed at [facility], [place] [address] [place], [place] [address]      Labs: Basic Metabolic Panel: Recent Labs  Lab 02/07/19 0600 02/08/19 0920 02/09/19 0515 02/10/19 0430  02/11/19 0340  NA 138 142 140 141 139  K 3.9 3.7 4.4 3.9 4.5  CL 103 102 103 105 102  CO2 24 29 29 26 28   GLUCOSE 131* 175* 187* 157* 198*  BUN 21 22 24* 21 22  CREATININE 0.98 1.02* 0.95 0.82 0.88  CALCIUM 8.0* 8.2* 8.1* 7.9* 8.4*  MG 1.9 1.8 2.0 1.8 1.9  PHOS 3.0 3.5 3.3 3.4 3.7   Liver Function Tests: Recent Labs  Lab 02/07/19 0600 02/08/19 0920 02/09/19 0515 02/10/19 0430 02/11/19 0340  AST 14* 18 16 15 17   ALT 5 6 6 5 8   ALKPHOS 84 90 87 86 94  BILITOT 0.5 0.5 0.4 0.2* 0.3  PROT 6.4* 6.9 6.5 6.2* 6.7  ALBUMIN 3.2* 3.4* 3.3* 3.2* 3.4*   Recent Labs  Lab 02/05/19 1303  LIPASE 24   No results for input(s): AMMONIA in the last 168 hours. CBC: Recent Labs  Lab 02/07/19 0600 02/08/19 0920 02/09/19 0515 02/10/19 0430 02/11/19 0340  WBC 2.1* 1.8* 3.3* 3.0* 3.7*  NEUTROABS 1.4* 0.9* 2.5 1.9 2.7  HGB 11.0* 11.2* 10.1* 9.8* 10.7*  HCT 35.0* 35.8* 32.5* 31.4* 33.0*  MCV 98.9 98.9 98.8 99.1 97.1  PLT 103* 110* 113* 115* 146*   Cardiac Enzymes: No results for input(s): CKTOTAL, CKMB, CKMBINDEX, TROPONINI in the last 168 hours. BNP: BNP (last 3 results) No results for input(s): BNP in the last 8760 hours.  ProBNP (last 3 results) No results for input(s): PROBNP in the last 8760 hours.  CBG: Recent Labs  Lab 02/10/19 0623 02/10/19 0802 02/10/19 1206 02/10/19 1613 02/11/19 0737  GLUCAP 178* 176* 126* 97 136*       Signed:  Fayrene Helper MD.  [facility] 02/11/2019, 11:38 AM

## 2019-02-11 NOTE — Progress Notes (Signed)
Discharged to [facility] in care of [facility]. Pt alert and oriented x 4. No distress seen. Denies pain. No abnormal behaviors seen. AVS given to [facility] staff. Report called to [facility] at [facility]. IV removed without incident.

## 2019-02-11 NOTE — TOC Transition Note (Signed)
Transition of Care [facility]) - CM/SW Discharge Note   Patient Details  Name: Kristen Mason MRN: [id number] Date of Birth: 18-Mar-1934  [facility] CM/SW Contact:  Atilano Median, LCSW Phone Number: 02/11/2019, 12:43 PM   Clinical Narrative:     Discharged back to [facility]. Patient's daughter aware and agreeable to this plan. Number to call report [phone] room 157B given to unit RN Garnet Koyanagi. No other needs at this time. Case closed to this CSW.   Final next level of care: Skilled Nursing Facility Barriers to Discharge: Barriers Resolved   Patient Goals and CMS Choice Patient states their goals for this hospitalization and ongoing recovery are:: to return to [facility] CMS Medicare.gov Compare Post Acute Care list provided to:: Patient Choice offered to / list presented to : Patient  Discharge Placement   Existing PASRR number confirmed : 02/11/19          Patient chooses bed at: Other - please specify in the comment section below:([facility] [place]) Patient to be transferred to facility by: [place] Name of family member notified: [name] Patient and family notified of of transfer: 02/11/19  Discharge Plan and Services   Discharge Planning Services: CM Consult Post Acute Care Choice: Resumption of Svcs/PTA Provider          DME Arranged: N/A DME Agency: NA       HH Arranged: NA HH Agency: NA        Social Determinants of Health (SDOH) Interventions     Readmission Risk Interventions Readmission Risk Prevention Plan 11/03/2018  Transportation Screening Complete  PCP or Specialist Appt within 3-5 Days Complete  HRI or [place] Complete  Social Work Consult for [place] Planning/Counseling Complete  Palliative Care Screening Not Applicable  Medication Review Press photographer) Complete  Some recent data might be hidden

## 2019-02-11 NOTE — Discharge Instructions (Signed)
COVID-19 COVID-19 is a respiratory infection that is caused by a virus called severe acute respiratory syndrome coronavirus 2 (SARS-CoV-2). The disease is also known as coronavirus disease or novel coronavirus. In some people, the virus may not cause any symptoms. In others, it may cause a serious infection. The infection can get worse quickly and can lead to complications, such as:  Pneumonia, or infection of the lungs.  Acute respiratory distress syndrome or ARDS. This is a condition in which fluid build-up in the lungs prevents the lungs from filling with air and passing oxygen into the blood.  Acute respiratory failure. This is a condition in which there is not enough oxygen passing from the lungs to the body or when carbon dioxide is not passing from the lungs out of the body.  Sepsis or septic shock. This is a serious bodily reaction to an infection.  Blood clotting problems.  Secondary infections due to bacteria or fungus.  Organ failure. This is when your body's organs stop working. The virus that causes COVID-19 is contagious. This means that it can spread from person to person through droplets from coughs and sneezes (respiratory secretions). What are the causes? This illness is caused by a virus. You may catch the virus by:  Breathing in droplets from an infected person. Droplets can be spread by a person breathing, speaking, singing, coughing, or sneezing.  Touching something, like a table or a doorknob, that was exposed to the virus (contaminated) and then touching your mouth, nose, or eyes. What increases the risk? Risk for infection You are more likely to be infected with this virus if you:  Are within 6 feet (2 meters) of a person with COVID-19.  Provide care for or live with a person who is infected with COVID-19.  Spend time in crowded indoor spaces or live in shared housing. Risk for serious illness You are more likely to become seriously ill from the virus if you:   Are 50 years of age or older. The higher your age, the more you are at risk for serious illness.  Live in a nursing home or long-term care facility.  Have cancer.  Have a long-term (chronic) disease such as: ? Chronic lung disease, including chronic obstructive pulmonary disease or asthma. ? A long-term disease that lowers your body's ability to fight infection (immunocompromised). ? Heart disease, including heart failure, a condition in which the arteries that lead to the heart become narrow or blocked (coronary artery disease), a disease which makes the heart muscle thick, weak, or stiff (cardiomyopathy). ? Diabetes. ? Chronic kidney disease. ? Sickle cell disease, a condition in which red blood cells have an abnormal "sickle" shape. ? Liver disease.  Are obese. What are the signs or symptoms? Symptoms of this condition can range from mild to severe. Symptoms may appear any time from 2 to 14 days after being exposed to the virus. They include:  A fever or chills.  A cough.  Difficulty breathing.  Headaches, body aches, or muscle aches.  Runny or stuffy (congested) nose.  A sore throat.  New loss of taste or smell. Some people may also have stomach problems, such as nausea, vomiting, or diarrhea. Other people may not have any symptoms of COVID-19. How is this diagnosed? This condition may be diagnosed based on:  Your signs and symptoms, especially if: ? You live in an area with a COVID-19 outbreak. ? You recently traveled to or from an area where the virus is common. ? You   provide care for or live with a person who was diagnosed with COVID-19. ? You were exposed to a person who was diagnosed with COVID-19.  A physical exam.  Lab tests, which may include: ? Taking a sample of fluid from the back of your nose and throat (nasopharyngeal fluid), your nose, or your throat using a swab. ? A sample of mucus from your lungs (sputum). ? Blood tests.  Imaging tests, which  may include, X-rays, CT scan, or ultrasound. How is this treated? At present, there is no medicine to treat COVID-19. Medicines that treat other diseases are being used on a trial basis to see if they are effective against COVID-19. Your health care provider will talk with you about ways to treat your symptoms. For most people, the infection is mild and can be managed at home with rest, fluids, and over-the-counter medicines. Treatment for a serious infection usually takes places in a hospital intensive care unit (ICU). It may include one or more of the following treatments. These treatments are given until your symptoms improve.  Receiving fluids and medicines through an IV.  Supplemental oxygen. Extra oxygen is given through a tube in the nose, a face mask, or a hood.  Positioning you to lie on your stomach (prone position). This makes it easier for oxygen to get into the lungs.  Continuous positive airway pressure (CPAP) or bi-level positive airway pressure (BPAP) machine. This treatment uses mild air pressure to keep the airways open. A tube that is connected to a motor delivers oxygen to the body.  Ventilator. This treatment moves air into and out of the lungs by using a tube that is placed in your windpipe.  Tracheostomy. This is a procedure to create a hole in the neck so that a breathing tube can be inserted.  Extracorporeal membrane oxygenation (ECMO). This procedure gives the lungs a chance to recover by taking over the functions of the heart and lungs. It supplies oxygen to the body and removes carbon dioxide. Follow these instructions at home: Lifestyle  If you are sick, stay home except to get medical care. Your health care provider will tell you how long to stay home. Call your health care provider before you go for medical care.  Rest at home as told by your health care provider.  Do not use any products that contain nicotine or tobacco, such as cigarettes, e-cigarettes, and  chewing tobacco. If you need help quitting, ask your health care provider.  Return to your normal activities as told by your health care provider. Ask your health care provider what activities are safe for you. General instructions  Take over-the-counter and prescription medicines only as told by your health care provider.  Drink enough fluid to keep your urine pale yellow.  Keep all follow-up visits as told by your health care provider. This is important. How is this prevented?  There is no vaccine to help prevent COVID-19 infection. However, there are steps you can take to protect yourself and others from this virus. To protect yourself:   Do not travel to areas where COVID-19 is a risk. The areas where COVID-19 is reported change often. To identify high-risk areas and travel restrictions, check the CDC travel website: wwwnc.cdc.gov/travel/notices  If you live in, or must travel to, an area where COVID-19 is a risk, take precautions to avoid infection. ? Stay away from people who are sick. ? Wash your hands often with soap and water for 20 seconds. If soap and water   are not available, use an alcohol-based hand sanitizer. ? Avoid touching your mouth, face, eyes, or nose. ? Avoid going out in public, follow guidance from your state and local health authorities. ? If you must go out in public, wear a cloth face covering or face mask. Make sure your mask covers your nose and mouth. ? Avoid crowded indoor spaces. Stay at least 6 feet (2 meters) away from others. ? Disinfect objects and surfaces that are frequently touched every day. This may include:  Counters and tables.  Doorknobs and light switches.  Sinks and faucets.  Electronics, such as phones, remote controls, keyboards, computers, and tablets. To protect others: If you have symptoms of COVID-19, take steps to prevent the virus from spreading to others.  If you think you have a COVID-19 infection, contact your health care  provider right away. Tell your health care team that you think you may have a COVID-19 infection.  Stay home. Leave your house only to seek medical care. Do not use public transport.  Do not travel while you are sick.  Wash your hands often with soap and water for 20 seconds. If soap and water are not available, use alcohol-based hand sanitizer.  Stay away from other members of your household. Let healthy household members care for children and pets, if possible. If you have to care for children or pets, wash your hands often and wear a mask. If possible, stay in your own room, separate from others. Use a different bathroom.  Make sure that all people in your household wash their hands well and often.  Cough or sneeze into a tissue or your sleeve or elbow. Do not cough or sneeze into your hand or into the air.  Wear a cloth face covering or face mask. Make sure your mask covers your nose and mouth. Where to find more information  Centers for Disease Control and Prevention: www.cdc.gov/coronavirus/2019-ncov/index.html  World Health Organization: www.who.int/health-topics/coronavirus Contact a health care provider if:  You live in or have traveled to an area where COVID-19 is a risk and you have symptoms of the infection.  You have had contact with someone who has COVID-19 and you have symptoms of the infection. Get help right away if:  You have trouble breathing.  You have pain or pressure in your chest.  You have confusion.  You have bluish lips and fingernails.  You have difficulty waking from sleep.  You have symptoms that get worse. These symptoms may represent a serious problem that is an emergency. Do not wait to see if the symptoms will go away. Get medical help right away. Call your local emergency services (911 in the [place] Do not drive yourself to the hospital. Let the emergency medical personnel know if you think you have COVID-19. Summary  COVID-19 is a  respiratory infection that is caused by a virus. It is also known as coronavirus disease or novel coronavirus. It can cause serious infections, such as pneumonia, acute respiratory distress syndrome, acute respiratory failure, or sepsis.  The virus that causes COVID-19 is contagious. This means that it can spread from person to person through droplets from breathing, speaking, singing, coughing, or sneezing.  You are more likely to develop a serious illness if you are 50 years of age or older, have a weak immune system, live in a nursing home, or have chronic disease.  There is no medicine to treat COVID-19. Your health care provider will talk with you about ways to treat your symptoms.    Take steps to protect yourself and others from infection. Wash your hands often and disinfect objects and surfaces that are frequently touched every day. Stay away from people who are sick and wear a mask if you are sick. This information is not intended to replace advice given to you by your health care provider. Make sure you discuss any questions you have with your health care provider. Document Revised: 11/11/2018 Document Reviewed: 02/17/2018 [employer] Patient Education  2020 [employer]  

## 2019-02-12 LAB — CULTURE, BLOOD (ROUTINE X 2)
Culture: NO GROWTH
Culture: NO GROWTH
Special Requests: ADEQUATE

## 2019-02-13 DIAGNOSIS — U071 COVID-19: Secondary | ICD-10-CM | POA: Diagnosis not present

## 2019-02-13 DIAGNOSIS — R531 Weakness: Secondary | ICD-10-CM | POA: Diagnosis not present

## 2019-02-13 DIAGNOSIS — N390 Urinary tract infection, site not specified: Secondary | ICD-10-CM | POA: Diagnosis not present

## 2019-02-13 DIAGNOSIS — J189 Pneumonia, unspecified organism: Secondary | ICD-10-CM | POA: Diagnosis not present

## 2019-02-14 DIAGNOSIS — J189 Pneumonia, unspecified organism: Secondary | ICD-10-CM | POA: Diagnosis not present

## 2019-02-14 DIAGNOSIS — U071 COVID-19: Secondary | ICD-10-CM | POA: Diagnosis not present

## 2019-02-17 DIAGNOSIS — U071 COVID-19: Secondary | ICD-10-CM | POA: Diagnosis not present

## 2019-02-17 DIAGNOSIS — R531 Weakness: Secondary | ICD-10-CM | POA: Diagnosis not present

## 2019-02-17 DIAGNOSIS — R7989 Other specified abnormal findings of blood chemistry: Secondary | ICD-10-CM | POA: Diagnosis not present

## 2019-02-20 DIAGNOSIS — U071 COVID-19: Secondary | ICD-10-CM | POA: Diagnosis not present

## 2019-02-20 DIAGNOSIS — R531 Weakness: Secondary | ICD-10-CM | POA: Diagnosis not present

## 2019-02-20 DIAGNOSIS — R7989 Other specified abnormal findings of blood chemistry: Secondary | ICD-10-CM | POA: Diagnosis not present

## 2019-03-01 DIAGNOSIS — Z23 Encounter for immunization: Secondary | ICD-10-CM | POA: Diagnosis not present

## 2019-03-06 DIAGNOSIS — M6281 Muscle weakness (generalized): Secondary | ICD-10-CM | POA: Diagnosis not present

## 2019-03-06 DIAGNOSIS — S72441A Displaced fracture of lower epiphysis (separation) of right femur, initial encounter for closed fracture: Secondary | ICD-10-CM | POA: Diagnosis not present

## 2019-03-06 DIAGNOSIS — G20 Parkinson's disease: Secondary | ICD-10-CM | POA: Diagnosis not present

## 2019-03-06 DIAGNOSIS — S72441D Displaced fracture of lower epiphysis (separation) of right femur, subsequent encounter for closed fracture with routine healing: Secondary | ICD-10-CM | POA: Diagnosis not present

## 2019-03-06 DIAGNOSIS — G250 Essential tremor: Secondary | ICD-10-CM | POA: Diagnosis not present

## 2019-03-06 DIAGNOSIS — R2689 Other abnormalities of gait and mobility: Secondary | ICD-10-CM | POA: Diagnosis not present

## 2019-03-07 DIAGNOSIS — R2689 Other abnormalities of gait and mobility: Secondary | ICD-10-CM | POA: Diagnosis not present

## 2019-03-07 DIAGNOSIS — S72441D Displaced fracture of lower epiphysis (separation) of right femur, subsequent encounter for closed fracture with routine healing: Secondary | ICD-10-CM | POA: Diagnosis not present

## 2019-03-07 DIAGNOSIS — G20 Parkinson's disease: Secondary | ICD-10-CM | POA: Diagnosis not present

## 2019-03-07 DIAGNOSIS — S72441A Displaced fracture of lower epiphysis (separation) of right femur, initial encounter for closed fracture: Secondary | ICD-10-CM | POA: Diagnosis not present

## 2019-03-07 DIAGNOSIS — G250 Essential tremor: Secondary | ICD-10-CM | POA: Diagnosis not present

## 2019-03-07 DIAGNOSIS — M6281 Muscle weakness (generalized): Secondary | ICD-10-CM | POA: Diagnosis not present

## 2019-03-08 DIAGNOSIS — S72441D Displaced fracture of lower epiphysis (separation) of right femur, subsequent encounter for closed fracture with routine healing: Secondary | ICD-10-CM | POA: Diagnosis not present

## 2019-03-08 DIAGNOSIS — G250 Essential tremor: Secondary | ICD-10-CM | POA: Diagnosis not present

## 2019-03-08 DIAGNOSIS — F331 Major depressive disorder, recurrent, moderate: Secondary | ICD-10-CM | POA: Diagnosis not present

## 2019-03-08 DIAGNOSIS — S72441A Displaced fracture of lower epiphysis (separation) of right femur, initial encounter for closed fracture: Secondary | ICD-10-CM | POA: Diagnosis not present

## 2019-03-08 DIAGNOSIS — M6281 Muscle weakness (generalized): Secondary | ICD-10-CM | POA: Diagnosis not present

## 2019-03-08 DIAGNOSIS — G20 Parkinson's disease: Secondary | ICD-10-CM | POA: Diagnosis not present

## 2019-03-08 DIAGNOSIS — R2689 Other abnormalities of gait and mobility: Secondary | ICD-10-CM | POA: Diagnosis not present

## 2019-03-08 DIAGNOSIS — F411 Generalized anxiety disorder: Secondary | ICD-10-CM | POA: Diagnosis not present

## 2019-03-09 DIAGNOSIS — G20 Parkinson's disease: Secondary | ICD-10-CM | POA: Diagnosis not present

## 2019-03-09 DIAGNOSIS — G250 Essential tremor: Secondary | ICD-10-CM | POA: Diagnosis not present

## 2019-03-09 DIAGNOSIS — R2689 Other abnormalities of gait and mobility: Secondary | ICD-10-CM | POA: Diagnosis not present

## 2019-03-09 DIAGNOSIS — S72441D Displaced fracture of lower epiphysis (separation) of right femur, subsequent encounter for closed fracture with routine healing: Secondary | ICD-10-CM | POA: Diagnosis not present

## 2019-03-09 DIAGNOSIS — M6281 Muscle weakness (generalized): Secondary | ICD-10-CM | POA: Diagnosis not present

## 2019-03-09 DIAGNOSIS — S72441A Displaced fracture of lower epiphysis (separation) of right femur, initial encounter for closed fracture: Secondary | ICD-10-CM | POA: Diagnosis not present

## 2019-03-10 DIAGNOSIS — G20 Parkinson's disease: Secondary | ICD-10-CM | POA: Diagnosis not present

## 2019-03-10 DIAGNOSIS — M6281 Muscle weakness (generalized): Secondary | ICD-10-CM | POA: Diagnosis not present

## 2019-03-10 DIAGNOSIS — S72441A Displaced fracture of lower epiphysis (separation) of right femur, initial encounter for closed fracture: Secondary | ICD-10-CM | POA: Diagnosis not present

## 2019-03-10 DIAGNOSIS — S72441D Displaced fracture of lower epiphysis (separation) of right femur, subsequent encounter for closed fracture with routine healing: Secondary | ICD-10-CM | POA: Diagnosis not present

## 2019-03-10 DIAGNOSIS — G250 Essential tremor: Secondary | ICD-10-CM | POA: Diagnosis not present

## 2019-03-10 DIAGNOSIS — R2689 Other abnormalities of gait and mobility: Secondary | ICD-10-CM | POA: Diagnosis not present

## 2019-03-13 DIAGNOSIS — S72441D Displaced fracture of lower epiphysis (separation) of right femur, subsequent encounter for closed fracture with routine healing: Secondary | ICD-10-CM | POA: Diagnosis not present

## 2019-03-13 DIAGNOSIS — G250 Essential tremor: Secondary | ICD-10-CM | POA: Diagnosis not present

## 2019-03-13 DIAGNOSIS — M6281 Muscle weakness (generalized): Secondary | ICD-10-CM | POA: Diagnosis not present

## 2019-03-13 DIAGNOSIS — G20 Parkinson's disease: Secondary | ICD-10-CM | POA: Diagnosis not present

## 2019-03-13 DIAGNOSIS — S72441A Displaced fracture of lower epiphysis (separation) of right femur, initial encounter for closed fracture: Secondary | ICD-10-CM | POA: Diagnosis not present

## 2019-03-13 DIAGNOSIS — R2689 Other abnormalities of gait and mobility: Secondary | ICD-10-CM | POA: Diagnosis not present

## 2019-03-14 DIAGNOSIS — G20 Parkinson's disease: Secondary | ICD-10-CM | POA: Diagnosis not present

## 2019-03-14 DIAGNOSIS — G250 Essential tremor: Secondary | ICD-10-CM | POA: Diagnosis not present

## 2019-03-14 DIAGNOSIS — R2689 Other abnormalities of gait and mobility: Secondary | ICD-10-CM | POA: Diagnosis not present

## 2019-03-14 DIAGNOSIS — S72441D Displaced fracture of lower epiphysis (separation) of right femur, subsequent encounter for closed fracture with routine healing: Secondary | ICD-10-CM | POA: Diagnosis not present

## 2019-03-14 DIAGNOSIS — S72441A Displaced fracture of lower epiphysis (separation) of right femur, initial encounter for closed fracture: Secondary | ICD-10-CM | POA: Diagnosis not present

## 2019-03-14 DIAGNOSIS — M6281 Muscle weakness (generalized): Secondary | ICD-10-CM | POA: Diagnosis not present

## 2019-03-15 DIAGNOSIS — F411 Generalized anxiety disorder: Secondary | ICD-10-CM | POA: Diagnosis not present

## 2019-03-15 DIAGNOSIS — S72441D Displaced fracture of lower epiphysis (separation) of right femur, subsequent encounter for closed fracture with routine healing: Secondary | ICD-10-CM | POA: Diagnosis not present

## 2019-03-15 DIAGNOSIS — G20 Parkinson's disease: Secondary | ICD-10-CM | POA: Diagnosis not present

## 2019-03-15 DIAGNOSIS — S72441A Displaced fracture of lower epiphysis (separation) of right femur, initial encounter for closed fracture: Secondary | ICD-10-CM | POA: Diagnosis not present

## 2019-03-15 DIAGNOSIS — M6281 Muscle weakness (generalized): Secondary | ICD-10-CM | POA: Diagnosis not present

## 2019-03-15 DIAGNOSIS — R2689 Other abnormalities of gait and mobility: Secondary | ICD-10-CM | POA: Diagnosis not present

## 2019-03-15 DIAGNOSIS — G250 Essential tremor: Secondary | ICD-10-CM | POA: Diagnosis not present

## 2019-03-15 DIAGNOSIS — F331 Major depressive disorder, recurrent, moderate: Secondary | ICD-10-CM | POA: Diagnosis not present

## 2019-03-16 DIAGNOSIS — S72441A Displaced fracture of lower epiphysis (separation) of right femur, initial encounter for closed fracture: Secondary | ICD-10-CM | POA: Diagnosis not present

## 2019-03-16 DIAGNOSIS — M6281 Muscle weakness (generalized): Secondary | ICD-10-CM | POA: Diagnosis not present

## 2019-03-16 DIAGNOSIS — G4700 Insomnia, unspecified: Secondary | ICD-10-CM | POA: Diagnosis not present

## 2019-03-16 DIAGNOSIS — F331 Major depressive disorder, recurrent, moderate: Secondary | ICD-10-CM | POA: Diagnosis not present

## 2019-03-16 DIAGNOSIS — R2689 Other abnormalities of gait and mobility: Secondary | ICD-10-CM | POA: Diagnosis not present

## 2019-03-16 DIAGNOSIS — G250 Essential tremor: Secondary | ICD-10-CM | POA: Diagnosis not present

## 2019-03-16 DIAGNOSIS — S72441D Displaced fracture of lower epiphysis (separation) of right femur, subsequent encounter for closed fracture with routine healing: Secondary | ICD-10-CM | POA: Diagnosis not present

## 2019-03-16 DIAGNOSIS — G20 Parkinson's disease: Secondary | ICD-10-CM | POA: Diagnosis not present

## 2019-03-16 DIAGNOSIS — F411 Generalized anxiety disorder: Secondary | ICD-10-CM | POA: Diagnosis not present

## 2019-03-17 DIAGNOSIS — G20 Parkinson's disease: Secondary | ICD-10-CM | POA: Diagnosis not present

## 2019-03-17 DIAGNOSIS — F329 Major depressive disorder, single episode, unspecified: Secondary | ICD-10-CM | POA: Diagnosis not present

## 2019-03-17 DIAGNOSIS — R2689 Other abnormalities of gait and mobility: Secondary | ICD-10-CM | POA: Diagnosis not present

## 2019-03-17 DIAGNOSIS — I480 Paroxysmal atrial fibrillation: Secondary | ICD-10-CM | POA: Diagnosis not present

## 2019-03-17 DIAGNOSIS — G250 Essential tremor: Secondary | ICD-10-CM | POA: Diagnosis not present

## 2019-03-17 DIAGNOSIS — M6281 Muscle weakness (generalized): Secondary | ICD-10-CM | POA: Diagnosis not present

## 2019-03-17 DIAGNOSIS — S72441D Displaced fracture of lower epiphysis (separation) of right femur, subsequent encounter for closed fracture with routine healing: Secondary | ICD-10-CM | POA: Diagnosis not present

## 2019-03-17 DIAGNOSIS — S72441A Displaced fracture of lower epiphysis (separation) of right femur, initial encounter for closed fracture: Secondary | ICD-10-CM | POA: Diagnosis not present

## 2019-03-17 DIAGNOSIS — I272 Pulmonary hypertension, unspecified: Secondary | ICD-10-CM | POA: Diagnosis not present

## 2019-03-20 DIAGNOSIS — R2689 Other abnormalities of gait and mobility: Secondary | ICD-10-CM | POA: Diagnosis not present

## 2019-03-20 DIAGNOSIS — G20 Parkinson's disease: Secondary | ICD-10-CM | POA: Diagnosis not present

## 2019-03-20 DIAGNOSIS — R300 Dysuria: Secondary | ICD-10-CM | POA: Diagnosis not present

## 2019-03-20 DIAGNOSIS — Z87440 Personal history of urinary (tract) infections: Secondary | ICD-10-CM | POA: Diagnosis not present

## 2019-03-20 DIAGNOSIS — G250 Essential tremor: Secondary | ICD-10-CM | POA: Diagnosis not present

## 2019-03-20 DIAGNOSIS — M6281 Muscle weakness (generalized): Secondary | ICD-10-CM | POA: Diagnosis not present

## 2019-03-20 DIAGNOSIS — S72441A Displaced fracture of lower epiphysis (separation) of right femur, initial encounter for closed fracture: Secondary | ICD-10-CM | POA: Diagnosis not present

## 2019-03-20 DIAGNOSIS — S72441D Displaced fracture of lower epiphysis (separation) of right femur, subsequent encounter for closed fracture with routine healing: Secondary | ICD-10-CM | POA: Diagnosis not present

## 2019-03-21 DIAGNOSIS — S72441A Displaced fracture of lower epiphysis (separation) of right femur, initial encounter for closed fracture: Secondary | ICD-10-CM | POA: Diagnosis not present

## 2019-03-21 DIAGNOSIS — G250 Essential tremor: Secondary | ICD-10-CM | POA: Diagnosis not present

## 2019-03-21 DIAGNOSIS — S72441D Displaced fracture of lower epiphysis (separation) of right femur, subsequent encounter for closed fracture with routine healing: Secondary | ICD-10-CM | POA: Diagnosis not present

## 2019-03-21 DIAGNOSIS — R2689 Other abnormalities of gait and mobility: Secondary | ICD-10-CM | POA: Diagnosis not present

## 2019-03-21 DIAGNOSIS — G20 Parkinson's disease: Secondary | ICD-10-CM | POA: Diagnosis not present

## 2019-03-21 DIAGNOSIS — M6281 Muscle weakness (generalized): Secondary | ICD-10-CM | POA: Diagnosis not present

## 2019-03-22 DIAGNOSIS — M1712 Unilateral primary osteoarthritis, left knee: Secondary | ICD-10-CM | POA: Diagnosis not present

## 2019-03-22 DIAGNOSIS — G250 Essential tremor: Secondary | ICD-10-CM | POA: Diagnosis not present

## 2019-03-22 DIAGNOSIS — F411 Generalized anxiety disorder: Secondary | ICD-10-CM | POA: Diagnosis not present

## 2019-03-22 DIAGNOSIS — G20 Parkinson's disease: Secondary | ICD-10-CM | POA: Diagnosis not present

## 2019-03-22 DIAGNOSIS — M6281 Muscle weakness (generalized): Secondary | ICD-10-CM | POA: Diagnosis not present

## 2019-03-22 DIAGNOSIS — F331 Major depressive disorder, recurrent, moderate: Secondary | ICD-10-CM | POA: Diagnosis not present

## 2019-03-22 DIAGNOSIS — S72441D Displaced fracture of lower epiphysis (separation) of right femur, subsequent encounter for closed fracture with routine healing: Secondary | ICD-10-CM | POA: Diagnosis not present

## 2019-03-22 DIAGNOSIS — R2689 Other abnormalities of gait and mobility: Secondary | ICD-10-CM | POA: Diagnosis not present

## 2019-03-22 DIAGNOSIS — M19031 Primary osteoarthritis, right wrist: Secondary | ICD-10-CM | POA: Diagnosis not present

## 2019-03-22 DIAGNOSIS — M1612 Unilateral primary osteoarthritis, left hip: Secondary | ICD-10-CM | POA: Diagnosis not present

## 2019-03-22 DIAGNOSIS — S72451D Displaced supracondylar fracture without intracondylar extension of lower end of right femur, subsequent encounter for closed fracture with routine healing: Secondary | ICD-10-CM | POA: Diagnosis not present

## 2019-03-22 DIAGNOSIS — S72441A Displaced fracture of lower epiphysis (separation) of right femur, initial encounter for closed fracture: Secondary | ICD-10-CM | POA: Diagnosis not present

## 2019-03-23 DIAGNOSIS — S72441D Displaced fracture of lower epiphysis (separation) of right femur, subsequent encounter for closed fracture with routine healing: Secondary | ICD-10-CM | POA: Diagnosis not present

## 2019-03-23 DIAGNOSIS — N390 Urinary tract infection, site not specified: Secondary | ICD-10-CM | POA: Diagnosis not present

## 2019-03-23 DIAGNOSIS — R300 Dysuria: Secondary | ICD-10-CM | POA: Diagnosis not present

## 2019-03-23 DIAGNOSIS — R2689 Other abnormalities of gait and mobility: Secondary | ICD-10-CM | POA: Diagnosis not present

## 2019-03-23 DIAGNOSIS — E119 Type 2 diabetes mellitus without complications: Secondary | ICD-10-CM | POA: Diagnosis not present

## 2019-03-23 DIAGNOSIS — R319 Hematuria, unspecified: Secondary | ICD-10-CM | POA: Diagnosis not present

## 2019-03-23 DIAGNOSIS — M6281 Muscle weakness (generalized): Secondary | ICD-10-CM | POA: Diagnosis not present

## 2019-03-23 DIAGNOSIS — G20 Parkinson's disease: Secondary | ICD-10-CM | POA: Diagnosis not present

## 2019-03-23 DIAGNOSIS — S72441A Displaced fracture of lower epiphysis (separation) of right femur, initial encounter for closed fracture: Secondary | ICD-10-CM | POA: Diagnosis not present

## 2019-03-23 DIAGNOSIS — G250 Essential tremor: Secondary | ICD-10-CM | POA: Diagnosis not present

## 2019-03-24 DIAGNOSIS — N390 Urinary tract infection, site not specified: Secondary | ICD-10-CM | POA: Diagnosis not present

## 2019-03-24 DIAGNOSIS — R2689 Other abnormalities of gait and mobility: Secondary | ICD-10-CM | POA: Diagnosis not present

## 2019-03-24 DIAGNOSIS — M6281 Muscle weakness (generalized): Secondary | ICD-10-CM | POA: Diagnosis not present

## 2019-03-24 DIAGNOSIS — G250 Essential tremor: Secondary | ICD-10-CM | POA: Diagnosis not present

## 2019-03-24 DIAGNOSIS — S72441A Displaced fracture of lower epiphysis (separation) of right femur, initial encounter for closed fracture: Secondary | ICD-10-CM | POA: Diagnosis not present

## 2019-03-24 DIAGNOSIS — G20 Parkinson's disease: Secondary | ICD-10-CM | POA: Diagnosis not present

## 2019-03-24 DIAGNOSIS — R300 Dysuria: Secondary | ICD-10-CM | POA: Diagnosis not present

## 2019-03-24 DIAGNOSIS — S72441D Displaced fracture of lower epiphysis (separation) of right femur, subsequent encounter for closed fracture with routine healing: Secondary | ICD-10-CM | POA: Diagnosis not present

## 2019-03-24 DIAGNOSIS — Z87440 Personal history of urinary (tract) infections: Secondary | ICD-10-CM | POA: Diagnosis not present

## 2019-03-27 DIAGNOSIS — G250 Essential tremor: Secondary | ICD-10-CM | POA: Diagnosis not present

## 2019-03-27 DIAGNOSIS — S72441A Displaced fracture of lower epiphysis (separation) of right femur, initial encounter for closed fracture: Secondary | ICD-10-CM | POA: Diagnosis not present

## 2019-03-27 DIAGNOSIS — S72441D Displaced fracture of lower epiphysis (separation) of right femur, subsequent encounter for closed fracture with routine healing: Secondary | ICD-10-CM | POA: Diagnosis not present

## 2019-03-27 DIAGNOSIS — R300 Dysuria: Secondary | ICD-10-CM | POA: Diagnosis not present

## 2019-03-27 DIAGNOSIS — R2689 Other abnormalities of gait and mobility: Secondary | ICD-10-CM | POA: Diagnosis not present

## 2019-03-27 DIAGNOSIS — G20 Parkinson's disease: Secondary | ICD-10-CM | POA: Diagnosis not present

## 2019-03-27 DIAGNOSIS — Z87440 Personal history of urinary (tract) infections: Secondary | ICD-10-CM | POA: Diagnosis not present

## 2019-03-27 DIAGNOSIS — M6281 Muscle weakness (generalized): Secondary | ICD-10-CM | POA: Diagnosis not present

## 2019-03-27 DIAGNOSIS — F331 Major depressive disorder, recurrent, moderate: Secondary | ICD-10-CM | POA: Diagnosis not present

## 2019-03-27 DIAGNOSIS — N390 Urinary tract infection, site not specified: Secondary | ICD-10-CM | POA: Diagnosis not present

## 2019-03-27 DIAGNOSIS — F411 Generalized anxiety disorder: Secondary | ICD-10-CM | POA: Diagnosis not present

## 2019-03-27 DIAGNOSIS — M25562 Pain in left knee: Secondary | ICD-10-CM | POA: Diagnosis not present

## 2019-03-28 DIAGNOSIS — S72441A Displaced fracture of lower epiphysis (separation) of right femur, initial encounter for closed fracture: Secondary | ICD-10-CM | POA: Diagnosis not present

## 2019-03-28 DIAGNOSIS — R2689 Other abnormalities of gait and mobility: Secondary | ICD-10-CM | POA: Diagnosis not present

## 2019-03-28 DIAGNOSIS — M6281 Muscle weakness (generalized): Secondary | ICD-10-CM | POA: Diagnosis not present

## 2019-03-28 DIAGNOSIS — G20 Parkinson's disease: Secondary | ICD-10-CM | POA: Diagnosis not present

## 2019-03-28 DIAGNOSIS — G250 Essential tremor: Secondary | ICD-10-CM | POA: Diagnosis not present

## 2019-03-28 DIAGNOSIS — S72441D Displaced fracture of lower epiphysis (separation) of right femur, subsequent encounter for closed fracture with routine healing: Secondary | ICD-10-CM | POA: Diagnosis not present

## 2019-03-29 DIAGNOSIS — M6281 Muscle weakness (generalized): Secondary | ICD-10-CM | POA: Diagnosis not present

## 2019-03-29 DIAGNOSIS — S72441A Displaced fracture of lower epiphysis (separation) of right femur, initial encounter for closed fracture: Secondary | ICD-10-CM | POA: Diagnosis not present

## 2019-03-29 DIAGNOSIS — G20 Parkinson's disease: Secondary | ICD-10-CM | POA: Diagnosis not present

## 2019-03-29 DIAGNOSIS — S72441D Displaced fracture of lower epiphysis (separation) of right femur, subsequent encounter for closed fracture with routine healing: Secondary | ICD-10-CM | POA: Diagnosis not present

## 2019-03-29 DIAGNOSIS — G250 Essential tremor: Secondary | ICD-10-CM | POA: Diagnosis not present

## 2019-03-29 DIAGNOSIS — R2689 Other abnormalities of gait and mobility: Secondary | ICD-10-CM | POA: Diagnosis not present

## 2019-03-30 DIAGNOSIS — S72441D Displaced fracture of lower epiphysis (separation) of right femur, subsequent encounter for closed fracture with routine healing: Secondary | ICD-10-CM | POA: Diagnosis not present

## 2019-03-30 DIAGNOSIS — R2689 Other abnormalities of gait and mobility: Secondary | ICD-10-CM | POA: Diagnosis not present

## 2019-03-30 DIAGNOSIS — S72441A Displaced fracture of lower epiphysis (separation) of right femur, initial encounter for closed fracture: Secondary | ICD-10-CM | POA: Diagnosis not present

## 2019-03-30 DIAGNOSIS — G250 Essential tremor: Secondary | ICD-10-CM | POA: Diagnosis not present

## 2019-03-30 DIAGNOSIS — M6281 Muscle weakness (generalized): Secondary | ICD-10-CM | POA: Diagnosis not present

## 2019-03-30 DIAGNOSIS — G20 Parkinson's disease: Secondary | ICD-10-CM | POA: Diagnosis not present

## 2019-03-31 DIAGNOSIS — G250 Essential tremor: Secondary | ICD-10-CM | POA: Diagnosis not present

## 2019-03-31 DIAGNOSIS — S72441A Displaced fracture of lower epiphysis (separation) of right femur, initial encounter for closed fracture: Secondary | ICD-10-CM | POA: Diagnosis not present

## 2019-03-31 DIAGNOSIS — R2689 Other abnormalities of gait and mobility: Secondary | ICD-10-CM | POA: Diagnosis not present

## 2019-03-31 DIAGNOSIS — S72441D Displaced fracture of lower epiphysis (separation) of right femur, subsequent encounter for closed fracture with routine healing: Secondary | ICD-10-CM | POA: Diagnosis not present

## 2019-03-31 DIAGNOSIS — G20 Parkinson's disease: Secondary | ICD-10-CM | POA: Diagnosis not present

## 2019-03-31 DIAGNOSIS — M6281 Muscle weakness (generalized): Secondary | ICD-10-CM | POA: Diagnosis not present

## 2019-04-01 DIAGNOSIS — M6281 Muscle weakness (generalized): Secondary | ICD-10-CM | POA: Diagnosis not present

## 2019-04-01 DIAGNOSIS — S72441D Displaced fracture of lower epiphysis (separation) of right femur, subsequent encounter for closed fracture with routine healing: Secondary | ICD-10-CM | POA: Diagnosis not present

## 2019-04-01 DIAGNOSIS — G250 Essential tremor: Secondary | ICD-10-CM | POA: Diagnosis not present

## 2019-04-01 DIAGNOSIS — S72441A Displaced fracture of lower epiphysis (separation) of right femur, initial encounter for closed fracture: Secondary | ICD-10-CM | POA: Diagnosis not present

## 2019-04-01 DIAGNOSIS — G20 Parkinson's disease: Secondary | ICD-10-CM | POA: Diagnosis not present

## 2019-04-01 DIAGNOSIS — R2689 Other abnormalities of gait and mobility: Secondary | ICD-10-CM | POA: Diagnosis not present

## 2019-04-03 DIAGNOSIS — R2689 Other abnormalities of gait and mobility: Secondary | ICD-10-CM | POA: Diagnosis not present

## 2019-04-03 DIAGNOSIS — M6281 Muscle weakness (generalized): Secondary | ICD-10-CM | POA: Diagnosis not present

## 2019-04-03 DIAGNOSIS — S72441D Displaced fracture of lower epiphysis (separation) of right femur, subsequent encounter for closed fracture with routine healing: Secondary | ICD-10-CM | POA: Diagnosis not present

## 2019-04-03 DIAGNOSIS — G20 Parkinson's disease: Secondary | ICD-10-CM | POA: Diagnosis not present

## 2019-04-03 DIAGNOSIS — S72441A Displaced fracture of lower epiphysis (separation) of right femur, initial encounter for closed fracture: Secondary | ICD-10-CM | POA: Diagnosis not present

## 2019-04-03 DIAGNOSIS — G250 Essential tremor: Secondary | ICD-10-CM | POA: Diagnosis not present

## 2019-04-04 DIAGNOSIS — S72441A Displaced fracture of lower epiphysis (separation) of right femur, initial encounter for closed fracture: Secondary | ICD-10-CM | POA: Diagnosis not present

## 2019-04-04 DIAGNOSIS — M6281 Muscle weakness (generalized): Secondary | ICD-10-CM | POA: Diagnosis not present

## 2019-04-04 DIAGNOSIS — G20 Parkinson's disease: Secondary | ICD-10-CM | POA: Diagnosis not present

## 2019-04-04 DIAGNOSIS — R2689 Other abnormalities of gait and mobility: Secondary | ICD-10-CM | POA: Diagnosis not present

## 2019-04-04 DIAGNOSIS — G250 Essential tremor: Secondary | ICD-10-CM | POA: Diagnosis not present

## 2019-04-04 DIAGNOSIS — S72441D Displaced fracture of lower epiphysis (separation) of right femur, subsequent encounter for closed fracture with routine healing: Secondary | ICD-10-CM | POA: Diagnosis not present

## 2019-04-05 DIAGNOSIS — M6281 Muscle weakness (generalized): Secondary | ICD-10-CM | POA: Diagnosis not present

## 2019-04-05 DIAGNOSIS — R2689 Other abnormalities of gait and mobility: Secondary | ICD-10-CM | POA: Diagnosis not present

## 2019-04-05 DIAGNOSIS — S72441D Displaced fracture of lower epiphysis (separation) of right femur, subsequent encounter for closed fracture with routine healing: Secondary | ICD-10-CM | POA: Diagnosis not present

## 2019-04-05 DIAGNOSIS — G20 Parkinson's disease: Secondary | ICD-10-CM | POA: Diagnosis not present

## 2019-04-05 DIAGNOSIS — S72441A Displaced fracture of lower epiphysis (separation) of right femur, initial encounter for closed fracture: Secondary | ICD-10-CM | POA: Diagnosis not present

## 2019-04-05 DIAGNOSIS — G250 Essential tremor: Secondary | ICD-10-CM | POA: Diagnosis not present

## 2019-04-06 DIAGNOSIS — G20 Parkinson's disease: Secondary | ICD-10-CM | POA: Diagnosis not present

## 2019-04-06 DIAGNOSIS — R2689 Other abnormalities of gait and mobility: Secondary | ICD-10-CM | POA: Diagnosis not present

## 2019-04-06 DIAGNOSIS — M6281 Muscle weakness (generalized): Secondary | ICD-10-CM | POA: Diagnosis not present

## 2019-04-06 DIAGNOSIS — S72441A Displaced fracture of lower epiphysis (separation) of right femur, initial encounter for closed fracture: Secondary | ICD-10-CM | POA: Diagnosis not present

## 2019-04-06 DIAGNOSIS — G250 Essential tremor: Secondary | ICD-10-CM | POA: Diagnosis not present

## 2019-04-06 DIAGNOSIS — S72441D Displaced fracture of lower epiphysis (separation) of right femur, subsequent encounter for closed fracture with routine healing: Secondary | ICD-10-CM | POA: Diagnosis not present

## 2019-04-10 DIAGNOSIS — R2689 Other abnormalities of gait and mobility: Secondary | ICD-10-CM | POA: Diagnosis not present

## 2019-04-10 DIAGNOSIS — F411 Generalized anxiety disorder: Secondary | ICD-10-CM | POA: Diagnosis not present

## 2019-04-10 DIAGNOSIS — S72441A Displaced fracture of lower epiphysis (separation) of right femur, initial encounter for closed fracture: Secondary | ICD-10-CM | POA: Diagnosis not present

## 2019-04-10 DIAGNOSIS — F331 Major depressive disorder, recurrent, moderate: Secondary | ICD-10-CM | POA: Diagnosis not present

## 2019-04-10 DIAGNOSIS — G250 Essential tremor: Secondary | ICD-10-CM | POA: Diagnosis not present

## 2019-04-10 DIAGNOSIS — S72441D Displaced fracture of lower epiphysis (separation) of right femur, subsequent encounter for closed fracture with routine healing: Secondary | ICD-10-CM | POA: Diagnosis not present

## 2019-04-10 DIAGNOSIS — G20 Parkinson's disease: Secondary | ICD-10-CM | POA: Diagnosis not present

## 2019-04-10 DIAGNOSIS — M6281 Muscle weakness (generalized): Secondary | ICD-10-CM | POA: Diagnosis not present

## 2019-04-11 DIAGNOSIS — M6281 Muscle weakness (generalized): Secondary | ICD-10-CM | POA: Diagnosis not present

## 2019-04-11 DIAGNOSIS — S72441A Displaced fracture of lower epiphysis (separation) of right femur, initial encounter for closed fracture: Secondary | ICD-10-CM | POA: Diagnosis not present

## 2019-04-11 DIAGNOSIS — I480 Paroxysmal atrial fibrillation: Secondary | ICD-10-CM | POA: Diagnosis not present

## 2019-04-11 DIAGNOSIS — S72441D Displaced fracture of lower epiphysis (separation) of right femur, subsequent encounter for closed fracture with routine healing: Secondary | ICD-10-CM | POA: Diagnosis not present

## 2019-04-11 DIAGNOSIS — K219 Gastro-esophageal reflux disease without esophagitis: Secondary | ICD-10-CM | POA: Diagnosis not present

## 2019-04-11 DIAGNOSIS — G250 Essential tremor: Secondary | ICD-10-CM | POA: Diagnosis not present

## 2019-04-11 DIAGNOSIS — R2689 Other abnormalities of gait and mobility: Secondary | ICD-10-CM | POA: Diagnosis not present

## 2019-04-11 DIAGNOSIS — G20 Parkinson's disease: Secondary | ICD-10-CM | POA: Diagnosis not present

## 2019-04-12 DIAGNOSIS — G250 Essential tremor: Secondary | ICD-10-CM | POA: Diagnosis not present

## 2019-04-12 DIAGNOSIS — S72441A Displaced fracture of lower epiphysis (separation) of right femur, initial encounter for closed fracture: Secondary | ICD-10-CM | POA: Diagnosis not present

## 2019-04-12 DIAGNOSIS — S72441D Displaced fracture of lower epiphysis (separation) of right femur, subsequent encounter for closed fracture with routine healing: Secondary | ICD-10-CM | POA: Diagnosis not present

## 2019-04-12 DIAGNOSIS — R2689 Other abnormalities of gait and mobility: Secondary | ICD-10-CM | POA: Diagnosis not present

## 2019-04-12 DIAGNOSIS — M6281 Muscle weakness (generalized): Secondary | ICD-10-CM | POA: Diagnosis not present

## 2019-04-12 DIAGNOSIS — G20 Parkinson's disease: Secondary | ICD-10-CM | POA: Diagnosis not present

## 2019-04-14 DIAGNOSIS — D649 Anemia, unspecified: Secondary | ICD-10-CM | POA: Diagnosis not present

## 2019-04-14 DIAGNOSIS — Z79899 Other long term (current) drug therapy: Secondary | ICD-10-CM | POA: Diagnosis not present

## 2019-04-18 DIAGNOSIS — N3020 Other chronic cystitis without hematuria: Secondary | ICD-10-CM | POA: Diagnosis not present

## 2019-04-18 DIAGNOSIS — C659 Malignant neoplasm of unspecified renal pelvis: Secondary | ICD-10-CM | POA: Diagnosis not present

## 2019-04-19 DIAGNOSIS — F411 Generalized anxiety disorder: Secondary | ICD-10-CM | POA: Diagnosis not present

## 2019-04-19 DIAGNOSIS — G4700 Insomnia, unspecified: Secondary | ICD-10-CM | POA: Diagnosis not present

## 2019-04-19 DIAGNOSIS — G20 Parkinson's disease: Secondary | ICD-10-CM | POA: Diagnosis not present

## 2019-04-19 DIAGNOSIS — F331 Major depressive disorder, recurrent, moderate: Secondary | ICD-10-CM | POA: Diagnosis not present

## 2019-04-20 DIAGNOSIS — F331 Major depressive disorder, recurrent, moderate: Secondary | ICD-10-CM | POA: Diagnosis not present

## 2019-04-20 DIAGNOSIS — F411 Generalized anxiety disorder: Secondary | ICD-10-CM | POA: Diagnosis not present

## 2019-04-21 DIAGNOSIS — N390 Urinary tract infection, site not specified: Secondary | ICD-10-CM | POA: Diagnosis not present

## 2019-04-21 DIAGNOSIS — N2889 Other specified disorders of kidney and ureter: Secondary | ICD-10-CM | POA: Diagnosis not present

## 2019-04-21 DIAGNOSIS — R300 Dysuria: Secondary | ICD-10-CM | POA: Diagnosis not present

## 2019-04-21 DIAGNOSIS — N3020 Other chronic cystitis without hematuria: Secondary | ICD-10-CM | POA: Diagnosis not present

## 2019-04-24 DIAGNOSIS — E785 Hyperlipidemia, unspecified: Secondary | ICD-10-CM | POA: Diagnosis not present

## 2019-04-24 DIAGNOSIS — E119 Type 2 diabetes mellitus without complications: Secondary | ICD-10-CM | POA: Diagnosis not present

## 2019-04-24 DIAGNOSIS — E559 Vitamin D deficiency, unspecified: Secondary | ICD-10-CM | POA: Diagnosis not present

## 2019-04-24 DIAGNOSIS — K769 Liver disease, unspecified: Secondary | ICD-10-CM | POA: Diagnosis not present

## 2019-04-24 DIAGNOSIS — D649 Anemia, unspecified: Secondary | ICD-10-CM | POA: Diagnosis not present

## 2019-04-24 DIAGNOSIS — F331 Major depressive disorder, recurrent, moderate: Secondary | ICD-10-CM | POA: Diagnosis not present

## 2019-04-24 DIAGNOSIS — F411 Generalized anxiety disorder: Secondary | ICD-10-CM | POA: Diagnosis not present

## 2019-04-28 DIAGNOSIS — E559 Vitamin D deficiency, unspecified: Secondary | ICD-10-CM | POA: Diagnosis not present

## 2019-04-28 DIAGNOSIS — Z79899 Other long term (current) drug therapy: Secondary | ICD-10-CM | POA: Diagnosis not present

## 2019-05-10 DIAGNOSIS — F331 Major depressive disorder, recurrent, moderate: Secondary | ICD-10-CM | POA: Diagnosis not present

## 2019-05-10 DIAGNOSIS — F411 Generalized anxiety disorder: Secondary | ICD-10-CM | POA: Diagnosis not present

## 2019-05-12 DIAGNOSIS — G20 Parkinson's disease: Secondary | ICD-10-CM | POA: Diagnosis not present

## 2019-05-12 DIAGNOSIS — F411 Generalized anxiety disorder: Secondary | ICD-10-CM | POA: Diagnosis not present

## 2019-05-12 DIAGNOSIS — I480 Paroxysmal atrial fibrillation: Secondary | ICD-10-CM | POA: Diagnosis not present

## 2019-05-12 DIAGNOSIS — E118 Type 2 diabetes mellitus with unspecified complications: Secondary | ICD-10-CM | POA: Diagnosis not present

## 2019-05-22 DIAGNOSIS — F331 Major depressive disorder, recurrent, moderate: Secondary | ICD-10-CM | POA: Diagnosis not present

## 2019-05-22 DIAGNOSIS — F411 Generalized anxiety disorder: Secondary | ICD-10-CM | POA: Diagnosis not present

## 2019-06-01 DIAGNOSIS — F411 Generalized anxiety disorder: Secondary | ICD-10-CM | POA: Diagnosis not present

## 2019-06-01 DIAGNOSIS — F331 Major depressive disorder, recurrent, moderate: Secondary | ICD-10-CM | POA: Diagnosis not present

## 2019-06-06 DIAGNOSIS — G20 Parkinson's disease: Secondary | ICD-10-CM | POA: Diagnosis not present

## 2019-06-06 DIAGNOSIS — M6281 Muscle weakness (generalized): Secondary | ICD-10-CM | POA: Diagnosis not present

## 2019-06-06 DIAGNOSIS — I480 Paroxysmal atrial fibrillation: Secondary | ICD-10-CM | POA: Diagnosis not present

## 2019-06-06 DIAGNOSIS — R2689 Other abnormalities of gait and mobility: Secondary | ICD-10-CM | POA: Diagnosis not present

## 2019-06-06 DIAGNOSIS — J449 Chronic obstructive pulmonary disease, unspecified: Secondary | ICD-10-CM | POA: Diagnosis not present

## 2019-06-07 DIAGNOSIS — G20 Parkinson's disease: Secondary | ICD-10-CM | POA: Diagnosis not present

## 2019-06-07 DIAGNOSIS — R2689 Other abnormalities of gait and mobility: Secondary | ICD-10-CM | POA: Diagnosis not present

## 2019-06-07 DIAGNOSIS — M6281 Muscle weakness (generalized): Secondary | ICD-10-CM | POA: Diagnosis not present

## 2019-06-07 DIAGNOSIS — J449 Chronic obstructive pulmonary disease, unspecified: Secondary | ICD-10-CM | POA: Diagnosis not present

## 2019-06-07 DIAGNOSIS — I480 Paroxysmal atrial fibrillation: Secondary | ICD-10-CM | POA: Diagnosis not present

## 2019-06-08 DIAGNOSIS — G20 Parkinson's disease: Secondary | ICD-10-CM | POA: Diagnosis not present

## 2019-06-08 DIAGNOSIS — M6281 Muscle weakness (generalized): Secondary | ICD-10-CM | POA: Diagnosis not present

## 2019-06-08 DIAGNOSIS — J449 Chronic obstructive pulmonary disease, unspecified: Secondary | ICD-10-CM | POA: Diagnosis not present

## 2019-06-08 DIAGNOSIS — I480 Paroxysmal atrial fibrillation: Secondary | ICD-10-CM | POA: Diagnosis not present

## 2019-06-08 DIAGNOSIS — R2689 Other abnormalities of gait and mobility: Secondary | ICD-10-CM | POA: Diagnosis not present

## 2019-06-09 DIAGNOSIS — G20 Parkinson's disease: Secondary | ICD-10-CM | POA: Diagnosis not present

## 2019-06-09 DIAGNOSIS — R2689 Other abnormalities of gait and mobility: Secondary | ICD-10-CM | POA: Diagnosis not present

## 2019-06-09 DIAGNOSIS — J449 Chronic obstructive pulmonary disease, unspecified: Secondary | ICD-10-CM | POA: Diagnosis not present

## 2019-06-09 DIAGNOSIS — M6281 Muscle weakness (generalized): Secondary | ICD-10-CM | POA: Diagnosis not present

## 2019-06-09 DIAGNOSIS — I480 Paroxysmal atrial fibrillation: Secondary | ICD-10-CM | POA: Diagnosis not present

## 2019-06-12 DIAGNOSIS — R2689 Other abnormalities of gait and mobility: Secondary | ICD-10-CM | POA: Diagnosis not present

## 2019-06-12 DIAGNOSIS — J449 Chronic obstructive pulmonary disease, unspecified: Secondary | ICD-10-CM | POA: Diagnosis not present

## 2019-06-12 DIAGNOSIS — M6281 Muscle weakness (generalized): Secondary | ICD-10-CM | POA: Diagnosis not present

## 2019-06-12 DIAGNOSIS — I480 Paroxysmal atrial fibrillation: Secondary | ICD-10-CM | POA: Diagnosis not present

## 2019-06-12 DIAGNOSIS — G20 Parkinson's disease: Secondary | ICD-10-CM | POA: Diagnosis not present

## 2019-06-13 DIAGNOSIS — M6281 Muscle weakness (generalized): Secondary | ICD-10-CM | POA: Diagnosis not present

## 2019-06-13 DIAGNOSIS — J449 Chronic obstructive pulmonary disease, unspecified: Secondary | ICD-10-CM | POA: Diagnosis not present

## 2019-06-13 DIAGNOSIS — G20 Parkinson's disease: Secondary | ICD-10-CM | POA: Diagnosis not present

## 2019-06-13 DIAGNOSIS — I480 Paroxysmal atrial fibrillation: Secondary | ICD-10-CM | POA: Diagnosis not present

## 2019-06-13 DIAGNOSIS — R2689 Other abnormalities of gait and mobility: Secondary | ICD-10-CM | POA: Diagnosis not present

## 2019-06-14 DIAGNOSIS — M6281 Muscle weakness (generalized): Secondary | ICD-10-CM | POA: Diagnosis not present

## 2019-06-14 DIAGNOSIS — I480 Paroxysmal atrial fibrillation: Secondary | ICD-10-CM | POA: Diagnosis not present

## 2019-06-14 DIAGNOSIS — G20 Parkinson's disease: Secondary | ICD-10-CM | POA: Diagnosis not present

## 2019-06-14 DIAGNOSIS — J449 Chronic obstructive pulmonary disease, unspecified: Secondary | ICD-10-CM | POA: Diagnosis not present

## 2019-06-14 DIAGNOSIS — R2689 Other abnormalities of gait and mobility: Secondary | ICD-10-CM | POA: Diagnosis not present

## 2019-06-15 DIAGNOSIS — I480 Paroxysmal atrial fibrillation: Secondary | ICD-10-CM | POA: Diagnosis not present

## 2019-06-15 DIAGNOSIS — M6281 Muscle weakness (generalized): Secondary | ICD-10-CM | POA: Diagnosis not present

## 2019-06-15 DIAGNOSIS — J449 Chronic obstructive pulmonary disease, unspecified: Secondary | ICD-10-CM | POA: Diagnosis not present

## 2019-06-15 DIAGNOSIS — R2689 Other abnormalities of gait and mobility: Secondary | ICD-10-CM | POA: Diagnosis not present

## 2019-06-15 DIAGNOSIS — F331 Major depressive disorder, recurrent, moderate: Secondary | ICD-10-CM | POA: Diagnosis not present

## 2019-06-15 DIAGNOSIS — G20 Parkinson's disease: Secondary | ICD-10-CM | POA: Diagnosis not present

## 2019-06-15 DIAGNOSIS — F411 Generalized anxiety disorder: Secondary | ICD-10-CM | POA: Diagnosis not present

## 2019-06-17 DIAGNOSIS — I480 Paroxysmal atrial fibrillation: Secondary | ICD-10-CM | POA: Diagnosis not present

## 2019-06-17 DIAGNOSIS — R2689 Other abnormalities of gait and mobility: Secondary | ICD-10-CM | POA: Diagnosis not present

## 2019-06-17 DIAGNOSIS — J449 Chronic obstructive pulmonary disease, unspecified: Secondary | ICD-10-CM | POA: Diagnosis not present

## 2019-06-17 DIAGNOSIS — M6281 Muscle weakness (generalized): Secondary | ICD-10-CM | POA: Diagnosis not present

## 2019-06-17 DIAGNOSIS — G20 Parkinson's disease: Secondary | ICD-10-CM | POA: Diagnosis not present

## 2019-06-20 DIAGNOSIS — M47816 Spondylosis without myelopathy or radiculopathy, lumbar region: Secondary | ICD-10-CM | POA: Diagnosis not present

## 2019-06-20 DIAGNOSIS — M533 Sacrococcygeal disorders, not elsewhere classified: Secondary | ICD-10-CM | POA: Diagnosis not present

## 2019-06-20 DIAGNOSIS — G20 Parkinson's disease: Secondary | ICD-10-CM | POA: Diagnosis not present

## 2019-06-20 DIAGNOSIS — I480 Paroxysmal atrial fibrillation: Secondary | ICD-10-CM | POA: Diagnosis not present

## 2019-06-20 DIAGNOSIS — R2689 Other abnormalities of gait and mobility: Secondary | ICD-10-CM | POA: Diagnosis not present

## 2019-06-20 DIAGNOSIS — M6281 Muscle weakness (generalized): Secondary | ICD-10-CM | POA: Diagnosis not present

## 2019-06-20 DIAGNOSIS — J449 Chronic obstructive pulmonary disease, unspecified: Secondary | ICD-10-CM | POA: Diagnosis not present

## 2019-06-21 DIAGNOSIS — F411 Generalized anxiety disorder: Secondary | ICD-10-CM | POA: Diagnosis not present

## 2019-06-21 DIAGNOSIS — F331 Major depressive disorder, recurrent, moderate: Secondary | ICD-10-CM | POA: Diagnosis not present

## 2019-06-22 DIAGNOSIS — M6281 Muscle weakness (generalized): Secondary | ICD-10-CM | POA: Diagnosis not present

## 2019-06-22 DIAGNOSIS — G20 Parkinson's disease: Secondary | ICD-10-CM | POA: Diagnosis not present

## 2019-06-22 DIAGNOSIS — R2689 Other abnormalities of gait and mobility: Secondary | ICD-10-CM | POA: Diagnosis not present

## 2019-06-22 DIAGNOSIS — I480 Paroxysmal atrial fibrillation: Secondary | ICD-10-CM | POA: Diagnosis not present

## 2019-06-22 DIAGNOSIS — J449 Chronic obstructive pulmonary disease, unspecified: Secondary | ICD-10-CM | POA: Diagnosis not present

## 2019-06-26 DIAGNOSIS — C659 Malignant neoplasm of unspecified renal pelvis: Secondary | ICD-10-CM | POA: Diagnosis not present

## 2019-06-26 DIAGNOSIS — E559 Vitamin D deficiency, unspecified: Secondary | ICD-10-CM | POA: Diagnosis not present

## 2019-06-26 DIAGNOSIS — D500 Iron deficiency anemia secondary to blood loss (chronic): Secondary | ICD-10-CM | POA: Diagnosis not present

## 2019-06-26 DIAGNOSIS — D519 Vitamin B12 deficiency anemia, unspecified: Secondary | ICD-10-CM | POA: Diagnosis not present

## 2019-06-27 DIAGNOSIS — I480 Paroxysmal atrial fibrillation: Secondary | ICD-10-CM | POA: Diagnosis not present

## 2019-06-27 DIAGNOSIS — G20 Parkinson's disease: Secondary | ICD-10-CM | POA: Diagnosis not present

## 2019-06-27 DIAGNOSIS — R2689 Other abnormalities of gait and mobility: Secondary | ICD-10-CM | POA: Diagnosis not present

## 2019-06-27 DIAGNOSIS — J449 Chronic obstructive pulmonary disease, unspecified: Secondary | ICD-10-CM | POA: Diagnosis not present

## 2019-06-27 DIAGNOSIS — M6281 Muscle weakness (generalized): Secondary | ICD-10-CM | POA: Diagnosis not present

## 2019-06-28 DIAGNOSIS — F331 Major depressive disorder, recurrent, moderate: Secondary | ICD-10-CM | POA: Diagnosis not present

## 2019-06-28 DIAGNOSIS — G4700 Insomnia, unspecified: Secondary | ICD-10-CM | POA: Diagnosis not present

## 2019-06-28 DIAGNOSIS — G20 Parkinson's disease: Secondary | ICD-10-CM | POA: Diagnosis not present

## 2019-06-28 DIAGNOSIS — F411 Generalized anxiety disorder: Secondary | ICD-10-CM | POA: Diagnosis not present

## 2019-06-29 DIAGNOSIS — M6281 Muscle weakness (generalized): Secondary | ICD-10-CM | POA: Diagnosis not present

## 2019-06-29 DIAGNOSIS — I480 Paroxysmal atrial fibrillation: Secondary | ICD-10-CM | POA: Diagnosis not present

## 2019-06-29 DIAGNOSIS — R2689 Other abnormalities of gait and mobility: Secondary | ICD-10-CM | POA: Diagnosis not present

## 2019-06-29 DIAGNOSIS — J449 Chronic obstructive pulmonary disease, unspecified: Secondary | ICD-10-CM | POA: Diagnosis not present

## 2019-06-29 DIAGNOSIS — G20 Parkinson's disease: Secondary | ICD-10-CM | POA: Diagnosis not present

## 2019-07-03 DIAGNOSIS — J449 Chronic obstructive pulmonary disease, unspecified: Secondary | ICD-10-CM | POA: Diagnosis not present

## 2019-07-03 DIAGNOSIS — M6281 Muscle weakness (generalized): Secondary | ICD-10-CM | POA: Diagnosis not present

## 2019-07-03 DIAGNOSIS — R251 Tremor, unspecified: Secondary | ICD-10-CM | POA: Diagnosis not present

## 2019-07-03 DIAGNOSIS — G20 Parkinson's disease: Secondary | ICD-10-CM | POA: Diagnosis not present

## 2019-07-03 DIAGNOSIS — R5381 Other malaise: Secondary | ICD-10-CM | POA: Diagnosis not present

## 2019-07-03 DIAGNOSIS — I480 Paroxysmal atrial fibrillation: Secondary | ICD-10-CM | POA: Diagnosis not present

## 2019-07-03 DIAGNOSIS — R2689 Other abnormalities of gait and mobility: Secondary | ICD-10-CM | POA: Diagnosis not present

## 2019-07-05 DIAGNOSIS — I480 Paroxysmal atrial fibrillation: Secondary | ICD-10-CM | POA: Diagnosis not present

## 2019-07-05 DIAGNOSIS — M6281 Muscle weakness (generalized): Secondary | ICD-10-CM | POA: Diagnosis not present

## 2019-07-05 DIAGNOSIS — J449 Chronic obstructive pulmonary disease, unspecified: Secondary | ICD-10-CM | POA: Diagnosis not present

## 2019-07-05 DIAGNOSIS — R2689 Other abnormalities of gait and mobility: Secondary | ICD-10-CM | POA: Diagnosis not present

## 2019-07-05 DIAGNOSIS — G20 Parkinson's disease: Secondary | ICD-10-CM | POA: Diagnosis not present

## 2019-07-06 DIAGNOSIS — M6281 Muscle weakness (generalized): Secondary | ICD-10-CM | POA: Diagnosis not present

## 2019-07-06 DIAGNOSIS — J449 Chronic obstructive pulmonary disease, unspecified: Secondary | ICD-10-CM | POA: Diagnosis not present

## 2019-07-06 DIAGNOSIS — R2689 Other abnormalities of gait and mobility: Secondary | ICD-10-CM | POA: Diagnosis not present

## 2019-07-06 DIAGNOSIS — G20 Parkinson's disease: Secondary | ICD-10-CM | POA: Diagnosis not present

## 2019-07-06 DIAGNOSIS — I480 Paroxysmal atrial fibrillation: Secondary | ICD-10-CM | POA: Diagnosis not present

## 2019-07-07 DIAGNOSIS — F331 Major depressive disorder, recurrent, moderate: Secondary | ICD-10-CM | POA: Diagnosis not present

## 2019-07-07 DIAGNOSIS — F411 Generalized anxiety disorder: Secondary | ICD-10-CM | POA: Diagnosis not present

## 2019-07-12 DIAGNOSIS — G20 Parkinson's disease: Secondary | ICD-10-CM | POA: Diagnosis not present

## 2019-07-12 DIAGNOSIS — I480 Paroxysmal atrial fibrillation: Secondary | ICD-10-CM | POA: Diagnosis not present

## 2019-07-12 DIAGNOSIS — R2689 Other abnormalities of gait and mobility: Secondary | ICD-10-CM | POA: Diagnosis not present

## 2019-07-12 DIAGNOSIS — M6281 Muscle weakness (generalized): Secondary | ICD-10-CM | POA: Diagnosis not present

## 2019-07-12 DIAGNOSIS — J449 Chronic obstructive pulmonary disease, unspecified: Secondary | ICD-10-CM | POA: Diagnosis not present

## 2019-07-14 DIAGNOSIS — G20 Parkinson's disease: Secondary | ICD-10-CM | POA: Diagnosis not present

## 2019-07-14 DIAGNOSIS — J449 Chronic obstructive pulmonary disease, unspecified: Secondary | ICD-10-CM | POA: Diagnosis not present

## 2019-07-14 DIAGNOSIS — R2689 Other abnormalities of gait and mobility: Secondary | ICD-10-CM | POA: Diagnosis not present

## 2019-07-14 DIAGNOSIS — M6281 Muscle weakness (generalized): Secondary | ICD-10-CM | POA: Diagnosis not present

## 2019-07-14 DIAGNOSIS — I480 Paroxysmal atrial fibrillation: Secondary | ICD-10-CM | POA: Diagnosis not present

## 2019-07-17 DIAGNOSIS — I480 Paroxysmal atrial fibrillation: Secondary | ICD-10-CM | POA: Diagnosis not present

## 2019-07-17 DIAGNOSIS — J449 Chronic obstructive pulmonary disease, unspecified: Secondary | ICD-10-CM | POA: Diagnosis not present

## 2019-07-17 DIAGNOSIS — M6281 Muscle weakness (generalized): Secondary | ICD-10-CM | POA: Diagnosis not present

## 2019-07-17 DIAGNOSIS — G20 Parkinson's disease: Secondary | ICD-10-CM | POA: Diagnosis not present

## 2019-07-17 DIAGNOSIS — R2689 Other abnormalities of gait and mobility: Secondary | ICD-10-CM | POA: Diagnosis not present

## 2019-07-19 DIAGNOSIS — M6281 Muscle weakness (generalized): Secondary | ICD-10-CM | POA: Diagnosis not present

## 2019-07-19 DIAGNOSIS — I480 Paroxysmal atrial fibrillation: Secondary | ICD-10-CM | POA: Diagnosis not present

## 2019-07-19 DIAGNOSIS — J449 Chronic obstructive pulmonary disease, unspecified: Secondary | ICD-10-CM | POA: Diagnosis not present

## 2019-07-19 DIAGNOSIS — F331 Major depressive disorder, recurrent, moderate: Secondary | ICD-10-CM | POA: Diagnosis not present

## 2019-07-19 DIAGNOSIS — G20 Parkinson's disease: Secondary | ICD-10-CM | POA: Diagnosis not present

## 2019-07-19 DIAGNOSIS — R2689 Other abnormalities of gait and mobility: Secondary | ICD-10-CM | POA: Diagnosis not present

## 2019-07-19 DIAGNOSIS — F411 Generalized anxiety disorder: Secondary | ICD-10-CM | POA: Diagnosis not present

## 2019-07-20 DIAGNOSIS — G20 Parkinson's disease: Secondary | ICD-10-CM | POA: Diagnosis not present

## 2019-07-20 DIAGNOSIS — R2689 Other abnormalities of gait and mobility: Secondary | ICD-10-CM | POA: Diagnosis not present

## 2019-07-20 DIAGNOSIS — I480 Paroxysmal atrial fibrillation: Secondary | ICD-10-CM | POA: Diagnosis not present

## 2019-07-20 DIAGNOSIS — M6281 Muscle weakness (generalized): Secondary | ICD-10-CM | POA: Diagnosis not present

## 2019-07-20 DIAGNOSIS — J449 Chronic obstructive pulmonary disease, unspecified: Secondary | ICD-10-CM | POA: Diagnosis not present

## 2019-07-25 DIAGNOSIS — R2689 Other abnormalities of gait and mobility: Secondary | ICD-10-CM | POA: Diagnosis not present

## 2019-07-25 DIAGNOSIS — M6281 Muscle weakness (generalized): Secondary | ICD-10-CM | POA: Diagnosis not present

## 2019-07-25 DIAGNOSIS — I480 Paroxysmal atrial fibrillation: Secondary | ICD-10-CM | POA: Diagnosis not present

## 2019-07-25 DIAGNOSIS — G20 Parkinson's disease: Secondary | ICD-10-CM | POA: Diagnosis not present

## 2019-07-25 DIAGNOSIS — J449 Chronic obstructive pulmonary disease, unspecified: Secondary | ICD-10-CM | POA: Diagnosis not present

## 2019-07-26 DIAGNOSIS — G20 Parkinson's disease: Secondary | ICD-10-CM | POA: Diagnosis not present

## 2019-07-26 DIAGNOSIS — M6281 Muscle weakness (generalized): Secondary | ICD-10-CM | POA: Diagnosis not present

## 2019-07-26 DIAGNOSIS — R2689 Other abnormalities of gait and mobility: Secondary | ICD-10-CM | POA: Diagnosis not present

## 2019-07-26 DIAGNOSIS — I480 Paroxysmal atrial fibrillation: Secondary | ICD-10-CM | POA: Diagnosis not present

## 2019-07-26 DIAGNOSIS — J449 Chronic obstructive pulmonary disease, unspecified: Secondary | ICD-10-CM | POA: Diagnosis not present

## 2019-08-01 DIAGNOSIS — I480 Paroxysmal atrial fibrillation: Secondary | ICD-10-CM | POA: Diagnosis not present

## 2019-08-01 DIAGNOSIS — M6281 Muscle weakness (generalized): Secondary | ICD-10-CM | POA: Diagnosis not present

## 2019-08-01 DIAGNOSIS — J449 Chronic obstructive pulmonary disease, unspecified: Secondary | ICD-10-CM | POA: Diagnosis not present

## 2019-08-01 DIAGNOSIS — R2689 Other abnormalities of gait and mobility: Secondary | ICD-10-CM | POA: Diagnosis not present

## 2019-08-01 DIAGNOSIS — G20 Parkinson's disease: Secondary | ICD-10-CM | POA: Diagnosis not present

## 2019-08-03 DIAGNOSIS — F411 Generalized anxiety disorder: Secondary | ICD-10-CM | POA: Diagnosis not present

## 2019-08-03 DIAGNOSIS — I480 Paroxysmal atrial fibrillation: Secondary | ICD-10-CM | POA: Diagnosis not present

## 2019-08-03 DIAGNOSIS — R2689 Other abnormalities of gait and mobility: Secondary | ICD-10-CM | POA: Diagnosis not present

## 2019-08-03 DIAGNOSIS — M6281 Muscle weakness (generalized): Secondary | ICD-10-CM | POA: Diagnosis not present

## 2019-08-03 DIAGNOSIS — G20 Parkinson's disease: Secondary | ICD-10-CM | POA: Diagnosis not present

## 2019-08-03 DIAGNOSIS — J449 Chronic obstructive pulmonary disease, unspecified: Secondary | ICD-10-CM | POA: Diagnosis not present

## 2019-08-03 DIAGNOSIS — F331 Major depressive disorder, recurrent, moderate: Secondary | ICD-10-CM | POA: Diagnosis not present

## 2019-08-08 DIAGNOSIS — J449 Chronic obstructive pulmonary disease, unspecified: Secondary | ICD-10-CM | POA: Diagnosis not present

## 2019-08-08 DIAGNOSIS — M6281 Muscle weakness (generalized): Secondary | ICD-10-CM | POA: Diagnosis not present

## 2019-08-08 DIAGNOSIS — G20 Parkinson's disease: Secondary | ICD-10-CM | POA: Diagnosis not present

## 2019-08-08 DIAGNOSIS — I480 Paroxysmal atrial fibrillation: Secondary | ICD-10-CM | POA: Diagnosis not present

## 2019-08-08 DIAGNOSIS — R2689 Other abnormalities of gait and mobility: Secondary | ICD-10-CM | POA: Diagnosis not present

## 2019-08-09 DIAGNOSIS — G20 Parkinson's disease: Secondary | ICD-10-CM | POA: Diagnosis not present

## 2019-08-09 DIAGNOSIS — M6281 Muscle weakness (generalized): Secondary | ICD-10-CM | POA: Diagnosis not present

## 2019-08-09 DIAGNOSIS — J449 Chronic obstructive pulmonary disease, unspecified: Secondary | ICD-10-CM | POA: Diagnosis not present

## 2019-08-09 DIAGNOSIS — I480 Paroxysmal atrial fibrillation: Secondary | ICD-10-CM | POA: Diagnosis not present

## 2019-08-09 DIAGNOSIS — R2689 Other abnormalities of gait and mobility: Secondary | ICD-10-CM | POA: Diagnosis not present

## 2019-08-10 DIAGNOSIS — G20 Parkinson's disease: Secondary | ICD-10-CM | POA: Diagnosis not present

## 2019-08-10 DIAGNOSIS — J449 Chronic obstructive pulmonary disease, unspecified: Secondary | ICD-10-CM | POA: Diagnosis not present

## 2019-08-10 DIAGNOSIS — I480 Paroxysmal atrial fibrillation: Secondary | ICD-10-CM | POA: Diagnosis not present

## 2019-08-10 DIAGNOSIS — M6281 Muscle weakness (generalized): Secondary | ICD-10-CM | POA: Diagnosis not present

## 2019-08-10 DIAGNOSIS — R2689 Other abnormalities of gait and mobility: Secondary | ICD-10-CM | POA: Diagnosis not present

## 2019-08-11 DIAGNOSIS — J449 Chronic obstructive pulmonary disease, unspecified: Secondary | ICD-10-CM | POA: Diagnosis not present

## 2019-08-11 DIAGNOSIS — R2689 Other abnormalities of gait and mobility: Secondary | ICD-10-CM | POA: Diagnosis not present

## 2019-08-11 DIAGNOSIS — I480 Paroxysmal atrial fibrillation: Secondary | ICD-10-CM | POA: Diagnosis not present

## 2019-08-11 DIAGNOSIS — M6281 Muscle weakness (generalized): Secondary | ICD-10-CM | POA: Diagnosis not present

## 2019-08-11 DIAGNOSIS — G20 Parkinson's disease: Secondary | ICD-10-CM | POA: Diagnosis not present

## 2019-08-14 DIAGNOSIS — Z79899 Other long term (current) drug therapy: Secondary | ICD-10-CM | POA: Diagnosis not present

## 2019-08-14 DIAGNOSIS — F329 Major depressive disorder, single episode, unspecified: Secondary | ICD-10-CM | POA: Diagnosis not present

## 2019-08-14 DIAGNOSIS — M6281 Muscle weakness (generalized): Secondary | ICD-10-CM | POA: Diagnosis not present

## 2019-08-14 DIAGNOSIS — I480 Paroxysmal atrial fibrillation: Secondary | ICD-10-CM | POA: Diagnosis not present

## 2019-08-14 DIAGNOSIS — J449 Chronic obstructive pulmonary disease, unspecified: Secondary | ICD-10-CM | POA: Diagnosis not present

## 2019-08-14 DIAGNOSIS — R2689 Other abnormalities of gait and mobility: Secondary | ICD-10-CM | POA: Diagnosis not present

## 2019-08-14 DIAGNOSIS — G20 Parkinson's disease: Secondary | ICD-10-CM | POA: Diagnosis not present

## 2019-08-14 DIAGNOSIS — I10 Essential (primary) hypertension: Secondary | ICD-10-CM | POA: Diagnosis not present

## 2019-08-17 DIAGNOSIS — M6281 Muscle weakness (generalized): Secondary | ICD-10-CM | POA: Diagnosis not present

## 2019-08-17 DIAGNOSIS — R2689 Other abnormalities of gait and mobility: Secondary | ICD-10-CM | POA: Diagnosis not present

## 2019-08-17 DIAGNOSIS — G20 Parkinson's disease: Secondary | ICD-10-CM | POA: Diagnosis not present

## 2019-08-17 DIAGNOSIS — J449 Chronic obstructive pulmonary disease, unspecified: Secondary | ICD-10-CM | POA: Diagnosis not present

## 2019-08-17 DIAGNOSIS — I480 Paroxysmal atrial fibrillation: Secondary | ICD-10-CM | POA: Diagnosis not present

## 2019-08-18 DIAGNOSIS — M6281 Muscle weakness (generalized): Secondary | ICD-10-CM | POA: Diagnosis not present

## 2019-08-18 DIAGNOSIS — J449 Chronic obstructive pulmonary disease, unspecified: Secondary | ICD-10-CM | POA: Diagnosis not present

## 2019-08-18 DIAGNOSIS — R2689 Other abnormalities of gait and mobility: Secondary | ICD-10-CM | POA: Diagnosis not present

## 2019-08-18 DIAGNOSIS — G20 Parkinson's disease: Secondary | ICD-10-CM | POA: Diagnosis not present

## 2019-08-18 DIAGNOSIS — I480 Paroxysmal atrial fibrillation: Secondary | ICD-10-CM | POA: Diagnosis not present

## 2019-08-21 DIAGNOSIS — J449 Chronic obstructive pulmonary disease, unspecified: Secondary | ICD-10-CM | POA: Diagnosis not present

## 2019-08-21 DIAGNOSIS — I480 Paroxysmal atrial fibrillation: Secondary | ICD-10-CM | POA: Diagnosis not present

## 2019-08-21 DIAGNOSIS — G20 Parkinson's disease: Secondary | ICD-10-CM | POA: Diagnosis not present

## 2019-08-21 DIAGNOSIS — F411 Generalized anxiety disorder: Secondary | ICD-10-CM | POA: Diagnosis not present

## 2019-08-21 DIAGNOSIS — R2689 Other abnormalities of gait and mobility: Secondary | ICD-10-CM | POA: Diagnosis not present

## 2019-08-21 DIAGNOSIS — M6281 Muscle weakness (generalized): Secondary | ICD-10-CM | POA: Diagnosis not present

## 2019-08-21 DIAGNOSIS — F331 Major depressive disorder, recurrent, moderate: Secondary | ICD-10-CM | POA: Diagnosis not present

## 2019-08-22 DIAGNOSIS — J449 Chronic obstructive pulmonary disease, unspecified: Secondary | ICD-10-CM | POA: Diagnosis not present

## 2019-08-22 DIAGNOSIS — G20 Parkinson's disease: Secondary | ICD-10-CM | POA: Diagnosis not present

## 2019-08-22 DIAGNOSIS — M6281 Muscle weakness (generalized): Secondary | ICD-10-CM | POA: Diagnosis not present

## 2019-08-22 DIAGNOSIS — R2689 Other abnormalities of gait and mobility: Secondary | ICD-10-CM | POA: Diagnosis not present

## 2019-08-22 DIAGNOSIS — I480 Paroxysmal atrial fibrillation: Secondary | ICD-10-CM | POA: Diagnosis not present

## 2019-08-23 DIAGNOSIS — I480 Paroxysmal atrial fibrillation: Secondary | ICD-10-CM | POA: Diagnosis not present

## 2019-08-23 DIAGNOSIS — G20 Parkinson's disease: Secondary | ICD-10-CM | POA: Diagnosis not present

## 2019-08-23 DIAGNOSIS — M6281 Muscle weakness (generalized): Secondary | ICD-10-CM | POA: Diagnosis not present

## 2019-08-23 DIAGNOSIS — J449 Chronic obstructive pulmonary disease, unspecified: Secondary | ICD-10-CM | POA: Diagnosis not present

## 2019-08-23 DIAGNOSIS — R2689 Other abnormalities of gait and mobility: Secondary | ICD-10-CM | POA: Diagnosis not present

## 2019-08-24 DIAGNOSIS — G4700 Insomnia, unspecified: Secondary | ICD-10-CM | POA: Diagnosis not present

## 2019-08-24 DIAGNOSIS — F331 Major depressive disorder, recurrent, moderate: Secondary | ICD-10-CM | POA: Diagnosis not present

## 2019-08-24 DIAGNOSIS — G20 Parkinson's disease: Secondary | ICD-10-CM | POA: Diagnosis not present

## 2019-08-24 DIAGNOSIS — F411 Generalized anxiety disorder: Secondary | ICD-10-CM | POA: Diagnosis not present

## 2019-08-25 DIAGNOSIS — R2689 Other abnormalities of gait and mobility: Secondary | ICD-10-CM | POA: Diagnosis not present

## 2019-08-25 DIAGNOSIS — I480 Paroxysmal atrial fibrillation: Secondary | ICD-10-CM | POA: Diagnosis not present

## 2019-08-25 DIAGNOSIS — I272 Pulmonary hypertension, unspecified: Secondary | ICD-10-CM | POA: Diagnosis not present

## 2019-08-25 DIAGNOSIS — E118 Type 2 diabetes mellitus with unspecified complications: Secondary | ICD-10-CM | POA: Diagnosis not present

## 2019-08-25 DIAGNOSIS — G20 Parkinson's disease: Secondary | ICD-10-CM | POA: Diagnosis not present

## 2019-08-25 DIAGNOSIS — J449 Chronic obstructive pulmonary disease, unspecified: Secondary | ICD-10-CM | POA: Diagnosis not present

## 2019-08-25 DIAGNOSIS — F411 Generalized anxiety disorder: Secondary | ICD-10-CM | POA: Diagnosis not present

## 2019-08-25 DIAGNOSIS — M6281 Muscle weakness (generalized): Secondary | ICD-10-CM | POA: Diagnosis not present

## 2019-08-25 DIAGNOSIS — F329 Major depressive disorder, single episode, unspecified: Secondary | ICD-10-CM | POA: Diagnosis not present

## 2019-08-25 DIAGNOSIS — E785 Hyperlipidemia, unspecified: Secondary | ICD-10-CM | POA: Diagnosis not present

## 2019-08-28 DIAGNOSIS — I480 Paroxysmal atrial fibrillation: Secondary | ICD-10-CM | POA: Diagnosis not present

## 2019-08-28 DIAGNOSIS — G20 Parkinson's disease: Secondary | ICD-10-CM | POA: Diagnosis not present

## 2019-08-28 DIAGNOSIS — M6281 Muscle weakness (generalized): Secondary | ICD-10-CM | POA: Diagnosis not present

## 2019-08-28 DIAGNOSIS — R2689 Other abnormalities of gait and mobility: Secondary | ICD-10-CM | POA: Diagnosis not present

## 2019-08-28 DIAGNOSIS — J449 Chronic obstructive pulmonary disease, unspecified: Secondary | ICD-10-CM | POA: Diagnosis not present

## 2019-08-30 DIAGNOSIS — M6281 Muscle weakness (generalized): Secondary | ICD-10-CM | POA: Diagnosis not present

## 2019-08-30 DIAGNOSIS — J449 Chronic obstructive pulmonary disease, unspecified: Secondary | ICD-10-CM | POA: Diagnosis not present

## 2019-08-30 DIAGNOSIS — G20 Parkinson's disease: Secondary | ICD-10-CM | POA: Diagnosis not present

## 2019-08-30 DIAGNOSIS — R2689 Other abnormalities of gait and mobility: Secondary | ICD-10-CM | POA: Diagnosis not present

## 2019-08-30 DIAGNOSIS — I480 Paroxysmal atrial fibrillation: Secondary | ICD-10-CM | POA: Diagnosis not present

## 2019-09-02 DIAGNOSIS — M6281 Muscle weakness (generalized): Secondary | ICD-10-CM | POA: Diagnosis not present

## 2019-09-02 DIAGNOSIS — I480 Paroxysmal atrial fibrillation: Secondary | ICD-10-CM | POA: Diagnosis not present

## 2019-09-02 DIAGNOSIS — R2689 Other abnormalities of gait and mobility: Secondary | ICD-10-CM | POA: Diagnosis not present

## 2019-09-02 DIAGNOSIS — J449 Chronic obstructive pulmonary disease, unspecified: Secondary | ICD-10-CM | POA: Diagnosis not present

## 2019-09-02 DIAGNOSIS — G20 Parkinson's disease: Secondary | ICD-10-CM | POA: Diagnosis not present

## 2019-09-04 DIAGNOSIS — G20 Parkinson's disease: Secondary | ICD-10-CM | POA: Diagnosis not present

## 2019-09-04 DIAGNOSIS — M6281 Muscle weakness (generalized): Secondary | ICD-10-CM | POA: Diagnosis not present

## 2019-09-04 DIAGNOSIS — R2689 Other abnormalities of gait and mobility: Secondary | ICD-10-CM | POA: Diagnosis not present

## 2019-09-04 DIAGNOSIS — J449 Chronic obstructive pulmonary disease, unspecified: Secondary | ICD-10-CM | POA: Diagnosis not present

## 2019-09-04 DIAGNOSIS — I480 Paroxysmal atrial fibrillation: Secondary | ICD-10-CM | POA: Diagnosis not present

## 2019-09-06 DIAGNOSIS — M6281 Muscle weakness (generalized): Secondary | ICD-10-CM | POA: Diagnosis not present

## 2019-09-06 DIAGNOSIS — J449 Chronic obstructive pulmonary disease, unspecified: Secondary | ICD-10-CM | POA: Diagnosis not present

## 2019-09-06 DIAGNOSIS — I480 Paroxysmal atrial fibrillation: Secondary | ICD-10-CM | POA: Diagnosis not present

## 2019-09-06 DIAGNOSIS — G20 Parkinson's disease: Secondary | ICD-10-CM | POA: Diagnosis not present

## 2019-09-06 DIAGNOSIS — R2689 Other abnormalities of gait and mobility: Secondary | ICD-10-CM | POA: Diagnosis not present

## 2019-09-08 DIAGNOSIS — J449 Chronic obstructive pulmonary disease, unspecified: Secondary | ICD-10-CM | POA: Diagnosis not present

## 2019-09-08 DIAGNOSIS — R2689 Other abnormalities of gait and mobility: Secondary | ICD-10-CM | POA: Diagnosis not present

## 2019-09-08 DIAGNOSIS — I480 Paroxysmal atrial fibrillation: Secondary | ICD-10-CM | POA: Diagnosis not present

## 2019-09-08 DIAGNOSIS — M6281 Muscle weakness (generalized): Secondary | ICD-10-CM | POA: Diagnosis not present

## 2019-09-08 DIAGNOSIS — G20 Parkinson's disease: Secondary | ICD-10-CM | POA: Diagnosis not present

## 2019-09-11 DIAGNOSIS — H52223 Regular astigmatism, bilateral: Secondary | ICD-10-CM | POA: Diagnosis not present

## 2019-09-11 DIAGNOSIS — H35033 Hypertensive retinopathy, bilateral: Secondary | ICD-10-CM | POA: Diagnosis not present

## 2019-09-11 DIAGNOSIS — H5212 Myopia, left eye: Secondary | ICD-10-CM | POA: Diagnosis not present

## 2019-09-11 DIAGNOSIS — H353131 Nonexudative age-related macular degeneration, bilateral, early dry stage: Secondary | ICD-10-CM | POA: Diagnosis not present

## 2019-09-11 DIAGNOSIS — H35372 Puckering of macula, left eye: Secondary | ICD-10-CM | POA: Diagnosis not present

## 2019-09-11 DIAGNOSIS — H16143 Punctate keratitis, bilateral: Secondary | ICD-10-CM | POA: Diagnosis not present

## 2019-09-11 DIAGNOSIS — E119 Type 2 diabetes mellitus without complications: Secondary | ICD-10-CM | POA: Diagnosis not present

## 2019-09-11 DIAGNOSIS — H524 Presbyopia: Secondary | ICD-10-CM | POA: Diagnosis not present

## 2019-09-11 DIAGNOSIS — Z961 Presence of intraocular lens: Secondary | ICD-10-CM | POA: Diagnosis not present

## 2019-09-11 DIAGNOSIS — H0102B Squamous blepharitis left eye, upper and lower eyelids: Secondary | ICD-10-CM | POA: Diagnosis not present

## 2019-09-11 DIAGNOSIS — H0102A Squamous blepharitis right eye, upper and lower eyelids: Secondary | ICD-10-CM | POA: Diagnosis not present

## 2019-09-12 DIAGNOSIS — I480 Paroxysmal atrial fibrillation: Secondary | ICD-10-CM | POA: Diagnosis not present

## 2019-09-12 DIAGNOSIS — M6281 Muscle weakness (generalized): Secondary | ICD-10-CM | POA: Diagnosis not present

## 2019-09-12 DIAGNOSIS — R2689 Other abnormalities of gait and mobility: Secondary | ICD-10-CM | POA: Diagnosis not present

## 2019-09-12 DIAGNOSIS — J449 Chronic obstructive pulmonary disease, unspecified: Secondary | ICD-10-CM | POA: Diagnosis not present

## 2019-09-12 DIAGNOSIS — G20 Parkinson's disease: Secondary | ICD-10-CM | POA: Diagnosis not present

## 2019-09-14 DIAGNOSIS — R2689 Other abnormalities of gait and mobility: Secondary | ICD-10-CM | POA: Diagnosis not present

## 2019-09-14 DIAGNOSIS — F331 Major depressive disorder, recurrent, moderate: Secondary | ICD-10-CM | POA: Diagnosis not present

## 2019-09-14 DIAGNOSIS — F411 Generalized anxiety disorder: Secondary | ICD-10-CM | POA: Diagnosis not present

## 2019-09-14 DIAGNOSIS — G20 Parkinson's disease: Secondary | ICD-10-CM | POA: Diagnosis not present

## 2019-09-14 DIAGNOSIS — M6281 Muscle weakness (generalized): Secondary | ICD-10-CM | POA: Diagnosis not present

## 2019-09-14 DIAGNOSIS — I480 Paroxysmal atrial fibrillation: Secondary | ICD-10-CM | POA: Diagnosis not present

## 2019-09-14 DIAGNOSIS — J449 Chronic obstructive pulmonary disease, unspecified: Secondary | ICD-10-CM | POA: Diagnosis not present

## 2019-09-15 DIAGNOSIS — G20 Parkinson's disease: Secondary | ICD-10-CM | POA: Diagnosis not present

## 2019-09-15 DIAGNOSIS — F329 Major depressive disorder, single episode, unspecified: Secondary | ICD-10-CM | POA: Diagnosis not present

## 2019-09-15 DIAGNOSIS — F419 Anxiety disorder, unspecified: Secondary | ICD-10-CM | POA: Diagnosis not present

## 2019-09-15 DIAGNOSIS — Z79899 Other long term (current) drug therapy: Secondary | ICD-10-CM | POA: Diagnosis not present

## 2019-09-15 DIAGNOSIS — E785 Hyperlipidemia, unspecified: Secondary | ICD-10-CM | POA: Diagnosis not present

## 2019-09-15 DIAGNOSIS — E118 Type 2 diabetes mellitus with unspecified complications: Secondary | ICD-10-CM | POA: Diagnosis not present

## 2019-09-18 DIAGNOSIS — R2689 Other abnormalities of gait and mobility: Secondary | ICD-10-CM | POA: Diagnosis not present

## 2019-09-18 DIAGNOSIS — J449 Chronic obstructive pulmonary disease, unspecified: Secondary | ICD-10-CM | POA: Diagnosis not present

## 2019-09-18 DIAGNOSIS — G20 Parkinson's disease: Secondary | ICD-10-CM | POA: Diagnosis not present

## 2019-09-18 DIAGNOSIS — M6281 Muscle weakness (generalized): Secondary | ICD-10-CM | POA: Diagnosis not present

## 2019-09-18 DIAGNOSIS — I480 Paroxysmal atrial fibrillation: Secondary | ICD-10-CM | POA: Diagnosis not present

## 2019-09-19 DIAGNOSIS — M6281 Muscle weakness (generalized): Secondary | ICD-10-CM | POA: Diagnosis not present

## 2019-09-19 DIAGNOSIS — J449 Chronic obstructive pulmonary disease, unspecified: Secondary | ICD-10-CM | POA: Diagnosis not present

## 2019-09-19 DIAGNOSIS — R2689 Other abnormalities of gait and mobility: Secondary | ICD-10-CM | POA: Diagnosis not present

## 2019-09-19 DIAGNOSIS — G20 Parkinson's disease: Secondary | ICD-10-CM | POA: Diagnosis not present

## 2019-09-19 DIAGNOSIS — I480 Paroxysmal atrial fibrillation: Secondary | ICD-10-CM | POA: Diagnosis not present

## 2019-09-20 DIAGNOSIS — F331 Major depressive disorder, recurrent, moderate: Secondary | ICD-10-CM | POA: Diagnosis not present

## 2019-09-20 DIAGNOSIS — F411 Generalized anxiety disorder: Secondary | ICD-10-CM | POA: Diagnosis not present

## 2019-09-21 DIAGNOSIS — G20 Parkinson's disease: Secondary | ICD-10-CM | POA: Diagnosis not present

## 2019-09-21 DIAGNOSIS — R2689 Other abnormalities of gait and mobility: Secondary | ICD-10-CM | POA: Diagnosis not present

## 2019-09-21 DIAGNOSIS — M6281 Muscle weakness (generalized): Secondary | ICD-10-CM | POA: Diagnosis not present

## 2019-09-21 DIAGNOSIS — J449 Chronic obstructive pulmonary disease, unspecified: Secondary | ICD-10-CM | POA: Diagnosis not present

## 2019-09-21 DIAGNOSIS — I480 Paroxysmal atrial fibrillation: Secondary | ICD-10-CM | POA: Diagnosis not present

## 2019-09-26 DIAGNOSIS — I480 Paroxysmal atrial fibrillation: Secondary | ICD-10-CM | POA: Diagnosis not present

## 2019-09-26 DIAGNOSIS — M6281 Muscle weakness (generalized): Secondary | ICD-10-CM | POA: Diagnosis not present

## 2019-09-26 DIAGNOSIS — J449 Chronic obstructive pulmonary disease, unspecified: Secondary | ICD-10-CM | POA: Diagnosis not present

## 2019-09-26 DIAGNOSIS — R2689 Other abnormalities of gait and mobility: Secondary | ICD-10-CM | POA: Diagnosis not present

## 2019-09-26 DIAGNOSIS — G20 Parkinson's disease: Secondary | ICD-10-CM | POA: Diagnosis not present

## 2019-09-27 DIAGNOSIS — J449 Chronic obstructive pulmonary disease, unspecified: Secondary | ICD-10-CM | POA: Diagnosis not present

## 2019-09-27 DIAGNOSIS — G20 Parkinson's disease: Secondary | ICD-10-CM | POA: Diagnosis not present

## 2019-09-27 DIAGNOSIS — I480 Paroxysmal atrial fibrillation: Secondary | ICD-10-CM | POA: Diagnosis not present

## 2019-09-27 DIAGNOSIS — R2689 Other abnormalities of gait and mobility: Secondary | ICD-10-CM | POA: Diagnosis not present

## 2019-09-27 DIAGNOSIS — M6281 Muscle weakness (generalized): Secondary | ICD-10-CM | POA: Diagnosis not present

## 2019-09-28 DIAGNOSIS — M6281 Muscle weakness (generalized): Secondary | ICD-10-CM | POA: Diagnosis not present

## 2019-09-28 DIAGNOSIS — I480 Paroxysmal atrial fibrillation: Secondary | ICD-10-CM | POA: Diagnosis not present

## 2019-09-28 DIAGNOSIS — J449 Chronic obstructive pulmonary disease, unspecified: Secondary | ICD-10-CM | POA: Diagnosis not present

## 2019-09-28 DIAGNOSIS — R2689 Other abnormalities of gait and mobility: Secondary | ICD-10-CM | POA: Diagnosis not present

## 2019-09-28 DIAGNOSIS — G20 Parkinson's disease: Secondary | ICD-10-CM | POA: Diagnosis not present

## 2019-10-11 DIAGNOSIS — U071 COVID-19: Secondary | ICD-10-CM | POA: Diagnosis not present

## 2019-10-11 DIAGNOSIS — F329 Major depressive disorder, single episode, unspecified: Secondary | ICD-10-CM | POA: Diagnosis not present

## 2019-10-11 DIAGNOSIS — R918 Other nonspecific abnormal finding of lung field: Secondary | ICD-10-CM | POA: Diagnosis not present

## 2019-10-11 DIAGNOSIS — G20 Parkinson's disease: Secondary | ICD-10-CM | POA: Diagnosis not present

## 2019-10-11 DIAGNOSIS — I272 Pulmonary hypertension, unspecified: Secondary | ICD-10-CM | POA: Diagnosis not present

## 2019-10-11 DIAGNOSIS — E118 Type 2 diabetes mellitus with unspecified complications: Secondary | ICD-10-CM | POA: Diagnosis not present

## 2019-10-11 DIAGNOSIS — F411 Generalized anxiety disorder: Secondary | ICD-10-CM | POA: Diagnosis not present

## 2019-10-11 DIAGNOSIS — I480 Paroxysmal atrial fibrillation: Secondary | ICD-10-CM | POA: Diagnosis not present

## 2019-10-11 DIAGNOSIS — B342 Coronavirus infection, unspecified: Secondary | ICD-10-CM | POA: Diagnosis not present

## 2019-10-12 DIAGNOSIS — Z20828 Contact with and (suspected) exposure to other viral communicable diseases: Secondary | ICD-10-CM | POA: Diagnosis not present

## 2019-10-12 DIAGNOSIS — U071 COVID-19: Secondary | ICD-10-CM | POA: Diagnosis not present

## 2019-10-13 DIAGNOSIS — R5381 Other malaise: Secondary | ICD-10-CM | POA: Diagnosis not present

## 2019-10-13 DIAGNOSIS — G20 Parkinson's disease: Secondary | ICD-10-CM | POA: Diagnosis not present

## 2019-10-13 DIAGNOSIS — S72441D Displaced fracture of lower epiphysis (separation) of right femur, subsequent encounter for closed fracture with routine healing: Secondary | ICD-10-CM | POA: Diagnosis not present

## 2019-10-13 DIAGNOSIS — M1711 Unilateral primary osteoarthritis, right knee: Secondary | ICD-10-CM | POA: Diagnosis not present

## 2019-10-13 DIAGNOSIS — I480 Paroxysmal atrial fibrillation: Secondary | ICD-10-CM | POA: Diagnosis not present

## 2019-10-13 DIAGNOSIS — M6281 Muscle weakness (generalized): Secondary | ICD-10-CM | POA: Diagnosis not present

## 2019-10-13 DIAGNOSIS — F329 Major depressive disorder, single episode, unspecified: Secondary | ICD-10-CM | POA: Diagnosis not present

## 2019-10-13 DIAGNOSIS — F411 Generalized anxiety disorder: Secondary | ICD-10-CM | POA: Diagnosis not present

## 2019-10-19 DIAGNOSIS — F331 Major depressive disorder, recurrent, moderate: Secondary | ICD-10-CM | POA: Diagnosis not present

## 2019-10-19 DIAGNOSIS — F411 Generalized anxiety disorder: Secondary | ICD-10-CM | POA: Diagnosis not present

## 2019-10-20 DIAGNOSIS — K219 Gastro-esophageal reflux disease without esophagitis: Secondary | ICD-10-CM | POA: Diagnosis not present

## 2019-10-20 DIAGNOSIS — E559 Vitamin D deficiency, unspecified: Secondary | ICD-10-CM | POA: Diagnosis not present

## 2019-10-20 DIAGNOSIS — I480 Paroxysmal atrial fibrillation: Secondary | ICD-10-CM | POA: Diagnosis not present

## 2019-10-24 DIAGNOSIS — J189 Pneumonia, unspecified organism: Secondary | ICD-10-CM | POA: Diagnosis not present

## 2019-10-24 DIAGNOSIS — I517 Cardiomegaly: Secondary | ICD-10-CM | POA: Diagnosis not present

## 2019-10-25 DIAGNOSIS — F331 Major depressive disorder, recurrent, moderate: Secondary | ICD-10-CM | POA: Diagnosis not present

## 2019-10-25 DIAGNOSIS — F411 Generalized anxiety disorder: Secondary | ICD-10-CM | POA: Diagnosis not present

## 2019-10-31 DIAGNOSIS — F411 Generalized anxiety disorder: Secondary | ICD-10-CM | POA: Diagnosis not present

## 2019-11-07 DIAGNOSIS — F331 Major depressive disorder, recurrent, moderate: Secondary | ICD-10-CM | POA: Diagnosis not present

## 2019-11-07 DIAGNOSIS — G4700 Insomnia, unspecified: Secondary | ICD-10-CM | POA: Diagnosis not present

## 2019-11-07 DIAGNOSIS — M6281 Muscle weakness (generalized): Secondary | ICD-10-CM | POA: Diagnosis not present

## 2019-11-07 DIAGNOSIS — F411 Generalized anxiety disorder: Secondary | ICD-10-CM | POA: Diagnosis not present

## 2019-11-07 DIAGNOSIS — G20 Parkinson's disease: Secondary | ICD-10-CM | POA: Diagnosis not present

## 2019-11-07 DIAGNOSIS — R2689 Other abnormalities of gait and mobility: Secondary | ICD-10-CM | POA: Diagnosis not present

## 2019-11-08 DIAGNOSIS — R2689 Other abnormalities of gait and mobility: Secondary | ICD-10-CM | POA: Diagnosis not present

## 2019-11-08 DIAGNOSIS — G20 Parkinson's disease: Secondary | ICD-10-CM | POA: Diagnosis not present

## 2019-11-08 DIAGNOSIS — M6281 Muscle weakness (generalized): Secondary | ICD-10-CM | POA: Diagnosis not present

## 2019-11-09 DIAGNOSIS — G20 Parkinson's disease: Secondary | ICD-10-CM | POA: Diagnosis not present

## 2019-11-09 DIAGNOSIS — M6281 Muscle weakness (generalized): Secondary | ICD-10-CM | POA: Diagnosis not present

## 2019-11-09 DIAGNOSIS — R2689 Other abnormalities of gait and mobility: Secondary | ICD-10-CM | POA: Diagnosis not present

## 2019-11-10 DIAGNOSIS — R2689 Other abnormalities of gait and mobility: Secondary | ICD-10-CM | POA: Diagnosis not present

## 2019-11-10 DIAGNOSIS — M6281 Muscle weakness (generalized): Secondary | ICD-10-CM | POA: Diagnosis not present

## 2019-11-10 DIAGNOSIS — F411 Generalized anxiety disorder: Secondary | ICD-10-CM | POA: Diagnosis not present

## 2019-11-10 DIAGNOSIS — G20 Parkinson's disease: Secondary | ICD-10-CM | POA: Diagnosis not present

## 2019-11-13 DIAGNOSIS — R2689 Other abnormalities of gait and mobility: Secondary | ICD-10-CM | POA: Diagnosis not present

## 2019-11-13 DIAGNOSIS — M6281 Muscle weakness (generalized): Secondary | ICD-10-CM | POA: Diagnosis not present

## 2019-11-13 DIAGNOSIS — G20 Parkinson's disease: Secondary | ICD-10-CM | POA: Diagnosis not present

## 2019-11-14 DIAGNOSIS — R2689 Other abnormalities of gait and mobility: Secondary | ICD-10-CM | POA: Diagnosis not present

## 2019-11-14 DIAGNOSIS — M6281 Muscle weakness (generalized): Secondary | ICD-10-CM | POA: Diagnosis not present

## 2019-11-14 DIAGNOSIS — G20 Parkinson's disease: Secondary | ICD-10-CM | POA: Diagnosis not present

## 2019-11-14 DIAGNOSIS — F411 Generalized anxiety disorder: Secondary | ICD-10-CM | POA: Diagnosis not present

## 2019-11-14 DIAGNOSIS — F331 Major depressive disorder, recurrent, moderate: Secondary | ICD-10-CM | POA: Diagnosis not present

## 2019-11-22 DIAGNOSIS — F411 Generalized anxiety disorder: Secondary | ICD-10-CM | POA: Diagnosis not present

## 2019-11-22 DIAGNOSIS — F331 Major depressive disorder, recurrent, moderate: Secondary | ICD-10-CM | POA: Diagnosis not present

## 2019-11-23 DIAGNOSIS — R2689 Other abnormalities of gait and mobility: Secondary | ICD-10-CM | POA: Diagnosis not present

## 2019-11-23 DIAGNOSIS — I272 Pulmonary hypertension, unspecified: Secondary | ICD-10-CM | POA: Diagnosis not present

## 2019-11-23 DIAGNOSIS — F329 Major depressive disorder, single episode, unspecified: Secondary | ICD-10-CM | POA: Diagnosis not present

## 2019-11-23 DIAGNOSIS — R5381 Other malaise: Secondary | ICD-10-CM | POA: Diagnosis not present

## 2019-11-23 DIAGNOSIS — I480 Paroxysmal atrial fibrillation: Secondary | ICD-10-CM | POA: Diagnosis not present

## 2019-11-23 DIAGNOSIS — Z79899 Other long term (current) drug therapy: Secondary | ICD-10-CM | POA: Diagnosis not present

## 2019-11-23 DIAGNOSIS — E118 Type 2 diabetes mellitus with unspecified complications: Secondary | ICD-10-CM | POA: Diagnosis not present

## 2019-11-23 DIAGNOSIS — M6281 Muscle weakness (generalized): Secondary | ICD-10-CM | POA: Diagnosis not present

## 2019-11-23 DIAGNOSIS — G20 Parkinson's disease: Secondary | ICD-10-CM | POA: Diagnosis not present

## 2019-11-23 DIAGNOSIS — F411 Generalized anxiety disorder: Secondary | ICD-10-CM | POA: Diagnosis not present

## 2019-12-01 DIAGNOSIS — F331 Major depressive disorder, recurrent, moderate: Secondary | ICD-10-CM | POA: Diagnosis not present

## 2019-12-01 DIAGNOSIS — F411 Generalized anxiety disorder: Secondary | ICD-10-CM | POA: Diagnosis not present

## 2019-12-07 DIAGNOSIS — Z23 Encounter for immunization: Secondary | ICD-10-CM | POA: Diagnosis not present

## 2019-12-07 DIAGNOSIS — F411 Generalized anxiety disorder: Secondary | ICD-10-CM | POA: Diagnosis not present

## 2019-12-07 DIAGNOSIS — F331 Major depressive disorder, recurrent, moderate: Secondary | ICD-10-CM | POA: Diagnosis not present

## 2019-12-15 DIAGNOSIS — F331 Major depressive disorder, recurrent, moderate: Secondary | ICD-10-CM | POA: Diagnosis not present

## 2019-12-15 DIAGNOSIS — F411 Generalized anxiety disorder: Secondary | ICD-10-CM | POA: Diagnosis not present

## 2019-12-19 DIAGNOSIS — F411 Generalized anxiety disorder: Secondary | ICD-10-CM | POA: Diagnosis not present

## 2019-12-19 DIAGNOSIS — G20 Parkinson's disease: Secondary | ICD-10-CM | POA: Diagnosis not present

## 2019-12-19 DIAGNOSIS — F331 Major depressive disorder, recurrent, moderate: Secondary | ICD-10-CM | POA: Diagnosis not present

## 2019-12-19 DIAGNOSIS — G4700 Insomnia, unspecified: Secondary | ICD-10-CM | POA: Diagnosis not present

## 2019-12-21 DIAGNOSIS — G20 Parkinson's disease: Secondary | ICD-10-CM | POA: Diagnosis not present

## 2019-12-27 DIAGNOSIS — F411 Generalized anxiety disorder: Secondary | ICD-10-CM | POA: Diagnosis not present

## 2020-01-02 DIAGNOSIS — F411 Generalized anxiety disorder: Secondary | ICD-10-CM | POA: Diagnosis not present

## 2020-01-09 DIAGNOSIS — F411 Generalized anxiety disorder: Secondary | ICD-10-CM | POA: Diagnosis not present

## 2020-06-06 ENCOUNTER — Emergency Department (HOSPITAL_COMMUNITY): Payer: Medicare (Managed Care)

## 2020-06-06 ENCOUNTER — Emergency Department (HOSPITAL_COMMUNITY)
Admission: EM | Admit: 2020-06-06 | Discharge: 2020-06-06 | Disposition: A | Discharge status: Home / Self Care | Payer: Medicare (Managed Care) | Attending: Emergency Medicine | Admitting: Emergency Medicine

## 2020-06-06 ENCOUNTER — Other Ambulatory Visit: Payer: Self-pay

## 2020-06-06 DIAGNOSIS — Z7901 Long term (current) use of anticoagulants: Secondary | ICD-10-CM | POA: Diagnosis not present

## 2020-06-06 DIAGNOSIS — W19XXXA Unspecified fall, initial encounter: Secondary | ICD-10-CM | POA: Insufficient documentation

## 2020-06-06 DIAGNOSIS — N10 Acute tubulo-interstitial nephritis: Secondary | ICD-10-CM | POA: Insufficient documentation

## 2020-06-06 DIAGNOSIS — M25562 Pain in left knee: Secondary | ICD-10-CM | POA: Diagnosis not present

## 2020-06-06 DIAGNOSIS — Z20822 Contact with and (suspected) exposure to covid-19: Secondary | ICD-10-CM | POA: Insufficient documentation

## 2020-06-06 DIAGNOSIS — Z79899 Other long term (current) drug therapy: Secondary | ICD-10-CM | POA: Insufficient documentation

## 2020-06-06 DIAGNOSIS — I10 Essential (primary) hypertension: Secondary | ICD-10-CM | POA: Diagnosis not present

## 2020-06-06 DIAGNOSIS — M25572 Pain in left ankle and joints of left foot: Secondary | ICD-10-CM | POA: Diagnosis not present

## 2020-06-06 DIAGNOSIS — E119 Type 2 diabetes mellitus without complications: Secondary | ICD-10-CM | POA: Insufficient documentation

## 2020-06-06 DIAGNOSIS — Z23 Encounter for immunization: Secondary | ICD-10-CM | POA: Insufficient documentation

## 2020-06-06 DIAGNOSIS — R509 Fever, unspecified: Secondary | ICD-10-CM | POA: Diagnosis not present

## 2020-06-06 DIAGNOSIS — M549 Dorsalgia, unspecified: Secondary | ICD-10-CM | POA: Insufficient documentation

## 2020-06-06 DIAGNOSIS — S0191XA Laceration without foreign body of unspecified part of head, initial encounter: Secondary | ICD-10-CM

## 2020-06-06 DIAGNOSIS — Z7982 Long term (current) use of aspirin: Secondary | ICD-10-CM | POA: Diagnosis not present

## 2020-06-06 DIAGNOSIS — S0101XA Laceration without foreign body of scalp, initial encounter: Secondary | ICD-10-CM | POA: Diagnosis not present

## 2020-06-06 DIAGNOSIS — M542 Cervicalgia: Secondary | ICD-10-CM | POA: Insufficient documentation

## 2020-06-06 DIAGNOSIS — Z85528 Personal history of other malignant neoplasm of kidney: Secondary | ICD-10-CM | POA: Diagnosis not present

## 2020-06-06 DIAGNOSIS — Z7984 Long term (current) use of oral hypoglycemic drugs: Secondary | ICD-10-CM | POA: Diagnosis not present

## 2020-06-06 DIAGNOSIS — G8929 Other chronic pain: Secondary | ICD-10-CM | POA: Insufficient documentation

## 2020-06-06 DIAGNOSIS — S0990XA Unspecified injury of head, initial encounter: Secondary | ICD-10-CM | POA: Diagnosis present

## 2020-06-06 LAB — I-STAT CHEM 8, ED
BUN: 20 mg/dL (ref 8–23)
Calcium, Ion: 1 mmol/L — ABNORMAL LOW (ref 1.15–1.40)
Chloride: 100 mmol/L (ref 98–111)
Creatinine, Ser: 1.3 mg/dL — ABNORMAL HIGH (ref 0.44–1.00)
Glucose, Bld: 145 mg/dL — ABNORMAL HIGH (ref 70–99)
HCT: 34 % — ABNORMAL LOW (ref 36.0–46.0)
Hemoglobin: 11.6 g/dL — ABNORMAL LOW (ref 12.0–15.0)
Potassium: 3.9 mmol/L (ref 3.5–5.1)
Sodium: 136 mmol/L (ref 135–145)
TCO2: 27 mmol/L (ref 22–32)

## 2020-06-06 LAB — CBC WITH DIFFERENTIAL/PLATELET
Abs Immature Granulocytes: 0.02 10*3/uL (ref 0.00–0.07)
Basophils Absolute: 0 10*3/uL (ref 0.0–0.1)
Basophils Relative: 0 %
Eosinophils Absolute: 0 10*3/uL (ref 0.0–0.5)
Eosinophils Relative: 0 %
HCT: 34.9 % — ABNORMAL LOW (ref 36.0–46.0)
Hemoglobin: 10.9 g/dL — ABNORMAL LOW (ref 12.0–15.0)
Immature Granulocytes: 0 %
Lymphocytes Relative: 7 %
Lymphs Abs: 0.5 10*3/uL — ABNORMAL LOW (ref 0.7–4.0)
MCH: 30.6 pg (ref 26.0–34.0)
MCHC: 31.2 g/dL (ref 30.0–36.0)
MCV: 98 fL (ref 80.0–100.0)
Monocytes Absolute: 0.5 10*3/uL (ref 0.1–1.0)
Monocytes Relative: 7 %
Neutro Abs: 5.8 10*3/uL (ref 1.7–7.7)
Neutrophils Relative %: 86 %
Platelets: 121 10*3/uL — ABNORMAL LOW (ref 150–400)
RBC: 3.56 MIL/uL — ABNORMAL LOW (ref 3.87–5.11)
RDW: 15.9 % — ABNORMAL HIGH (ref 11.5–15.5)
WBC: 6.8 10*3/uL (ref 4.0–10.5)
nRBC: 0 % (ref 0.0–0.2)

## 2020-06-06 LAB — URINALYSIS, ROUTINE W REFLEX MICROSCOPIC
Bilirubin Urine: NEGATIVE
Glucose, UA: NEGATIVE mg/dL
Ketones, ur: NEGATIVE mg/dL
Nitrite: POSITIVE — AB
Protein, ur: 30 mg/dL — AB
Specific Gravity, Urine: 1.009 (ref 1.005–1.030)
pH: 7 (ref 5.0–8.0)

## 2020-06-06 LAB — COMPREHENSIVE METABOLIC PANEL
ALT: 6 U/L (ref 0–44)
AST: 17 U/L (ref 15–41)
Albumin: 3.5 g/dL (ref 3.5–5.0)
Alkaline Phosphatase: 88 U/L (ref 38–126)
Anion gap: 9 (ref 5–15)
BUN: 19 mg/dL (ref 8–23)
CO2: 26 mmol/L (ref 22–32)
Calcium: 8.5 mg/dL — ABNORMAL LOW (ref 8.9–10.3)
Chloride: 100 mmol/L (ref 98–111)
Creatinine, Ser: 1.27 mg/dL — ABNORMAL HIGH (ref 0.44–1.00)
GFR, Estimated: 42 mL/min — ABNORMAL LOW (ref >60)
Glucose, Bld: 151 mg/dL — ABNORMAL HIGH (ref 70–99)
Potassium: 4 mmol/L (ref 3.5–5.1)
Sodium: 135 mmol/L (ref 135–145)
Total Bilirubin: 0.6 mg/dL (ref 0.3–1.2)
Total Protein: 6.3 g/dL — ABNORMAL LOW (ref 6.5–8.1)

## 2020-06-06 LAB — PROTIME-INR
INR: 1.3 — ABNORMAL HIGH (ref 0.8–1.2)
Prothrombin Time: 16 seconds — ABNORMAL HIGH (ref 11.4–15.2)

## 2020-06-06 LAB — RESP PANEL BY RT-PCR (FLU A&B, COVID) ARPGX2
Influenza A by PCR: NEGATIVE
Influenza B by PCR: NEGATIVE
SARS Coronavirus 2 by RT PCR: NEGATIVE

## 2020-06-06 LAB — LACTIC ACID, PLASMA: Lactic Acid, Venous: 1.3 mmol/L (ref 0.5–1.9)

## 2020-06-06 LAB — APTT: aPTT: 43 seconds — ABNORMAL HIGH (ref 24–36)

## 2020-06-06 MED ORDER — LACTATED RINGERS IV BOLUS (SEPSIS)
1000.0000 mL | Freq: Once | INTRAVENOUS | Status: AC
  Administered 2020-06-06: 1000 mL via INTRAVENOUS

## 2020-06-06 MED ORDER — SODIUM CHLORIDE 0.9 % IV SOLN
2.0000 g | Freq: Once | INTRAVENOUS | Status: AC
  Administered 2020-06-06: 2 g via INTRAVENOUS
  Filled 2020-06-06: qty 20

## 2020-06-06 MED ORDER — CEPHALEXIN 500 MG PO CAPS: 500.0000 mg | ORAL_CAPSULE | Freq: Three times a day (TID) | ORAL | 0 refills | Status: AC

## 2020-06-06 MED ORDER — LIDOCAINE-EPINEPHRINE (PF) 2 %-1:200000 IJ SOLN
10.0000 mL | Freq: Once | INTRAMUSCULAR | Status: AC
  Administered 2020-06-06: 10 mL
  Filled 2020-06-06: qty 20

## 2020-06-06 MED ORDER — TETANUS-DIPHTH-ACELL PERTUSSIS 5-2.5-18.5 LF-MCG/0.5 IM SUSY
0.5000 mL | PREFILLED_SYRINGE | Freq: Once | INTRAMUSCULAR | Status: AC
  Administered 2020-06-06: 0.5 mL via INTRAMUSCULAR
  Filled 2020-06-06: qty 0.5

## 2020-06-06 NOTE — ED Provider Notes (Signed)
LACERATION REPAIR Performed by: Dewaine Oats Authorized by: Dewaine Oats Consent: Verbal consent obtained. Risks and benefits: risks, benefits and alternatives were discussed Consent given by: patient Patient identity confirmed: provided demographic data Prepped and Draped in normal sterile fashion Wound explored  Laceration Location: frontal scalp    Laceration Length: 3 cm  No Foreign Bodies seen or palpated  Anesthesia: local infiltration  Local anesthetic: lidocaine 2% W/epinephrine Bleeding controlled  Anesthetic total: 2 ml  Irrigation method: syringe Amount of cleaning: standard  Skin closure: 4-0 prolene  Number of sutures: 5  Technique: simple interrupted  Patient tolerance: Patient tolerated the procedure well with no immediate complications.    Charlann Lange, PA-C 06/06/20 Danelle Earthly    Gareth Morgan, MD 06/06/20 [phone]

## 2020-06-06 NOTE — ED Provider Notes (Signed)
[facility] EMERGENCY DEPARTMENT Provider Note   CSN: [id number] Arrival date & time: 06/06/20  0013     History Chief Complaint  Patient presents with  . Trauma  . Fall    Fall on thinners     Kristen Mason is a 86 y.o. female.  HPI      86 year old female with history of Parkinson's disease, profound resting tremor, hypertension, paroxysmal A. fib on eliquis, type 2 diabetes, hyperlipidemia, recent right traumatic femur fracture, right kidney cancer who presents with concern for fall.  She arrives as a level 2 trauma for fall on anticoagulation.  Reports that she felt fatigued all day today, not herself, more generally weak.  Reports she did have some urinary symptoms.  She has a chronic cough which is unchanged and she attributes to allergies.  Denies nausea, vomiting, diarrhea, chest pain, shortness of breath, numbness, weakness.    Reports headache since the fall, as well as pain to the left knee and left ankle.  Reports that pain in the knee and ankle are 8 out of 10 and the headache is a 5 out of 10.  Reports she has chronic neck and back pain which are unchanged by the fall.  She was not aware of having a fever prior to arrival to the emergency department, but just reports that she felt off all day.  Reports she was laying in bed for a lot of the day.  Normally she only transfers from bed to wheelchair and wheelchair to commode and does not otherwise ambulate.  Reports that for some reason she thought she would stand up and walk to the commode and she fell down.  She denies any loss of consciousness.  Reports she remembers everything.  She is alert and oriented x4.  Resides at [facility].  See past medical history as listed above.   OB History   No obstetric history on file.     No family history on file.     Home Medications Prior to Admission medications   Medication Sig Start Date End Date Taking? Authorizing Provider  acetaminophen (TYLENOL)  325 MG tablet Take 650 mg by mouth every 6 (six) hours as needed for mild pain or fever.   Yes [provider]  amLODipine (NORVASC) 5 MG tablet Take 5 mg by mouth daily. 05/27/20  Yes [provider]  aspirin EC 81 MG tablet Take 81 mg by mouth daily. Swallow whole.   Yes [provider]  busPIRone (BUSPAR) 5 MG tablet Take 5 mg by mouth 3 (three) times daily. 05/25/20  Yes [provider]  CALCIUM CITRATE PO Take 1,000 mg by mouth in the morning and at bedtime.   Yes [provider]  carbidopa-levodopa (SINEMET IR) 25-100 MG tablet Take 1 tablet by mouth 3 (three) times daily. 05/14/20  Yes [provider]  Carboxymethylcellulose Sodium (REFRESH TEARS OP) Place 1 drop into both eyes in the morning, at noon, in the evening, and at bedtime.   Yes [provider]  cephALEXin (KEFLEX) 500 MG capsule Take 1 capsule (500 mg total) by mouth 3 (three) times daily for 14 days. 06/06/20 06/20/20 Yes Gareth Morgan, MD  citalopram (CELEXA) 40 MG tablet Take 40 mg by mouth daily. 05/17/20  Yes [provider]  clonazePAM (KLONOPIN) 1 MG tablet Take 1 mg by mouth at bedtime.   Yes [provider]  CRANBERRY PO Take 2 tablets by mouth in the morning and at bedtime.  Yes [provider]  diclofenac Sodium (VOLTAREN) 1 % GEL Apply 4 g topically in the morning, at noon, and at bedtime. Both knees 01/03/20  Yes [provider]  dicyclomine (BENTYL) 10 MG capsule Take 10 mg by mouth 4 (four) times daily -  before meals and at bedtime. 05/23/20  Yes [provider]  docusate sodium (COLACE) 100 MG capsule Take 100 mg by mouth 2 (two) times daily.   Yes [provider]  ELIQUIS 5 MG TABS tablet Take 5 mg by mouth 2 (two) times daily. 05/30/20  Yes [provider]  fluticasone (FLONASE) 50 MCG/ACT nasal spray Place 1 spray into both nostrils daily. 04/30/20  Yes [provider]  gabapentin  (NEURONTIN) 800 MG tablet Take 800 mg by mouth 3 (three) times daily. 05/11/20  Yes [provider]  glipiZIDE (GLUCOTROL) 5 MG tablet Take 5 mg by mouth daily. 05/23/20  Yes [provider]  guaifenesin (HUMIBID E) 400 MG TABS tablet Take 400 mg by mouth in the morning and at bedtime.   Yes [provider]  HYDROcodone-acetaminophen (NORCO/VICODIN) 5-325 MG tablet Take 1 tablet by mouth every 8 (eight) hours as needed for moderate pain. 04/24/20  Yes [provider]  hydrOXYzine (ATARAX/VISTARIL) 25 MG tablet Take 25 mg by mouth every 8 (eight) hours as needed for itching. 06/02/20  Yes [provider]  lidocaine (LIDODERM) 5 % Place 1 patch onto the skin See admin instructions. Apply to lower back in the morning and remove at bedtime.   Yes [provider]  Lidocaine-Glycerin (PREPARATION H EX) Place 1 application rectally every 6 (six) hours as needed (inflammation).   Yes [provider]  NYSTATIN powder Apply 1 application topically as needed (yeast). 05/09/20  Yes [provider]  pantoprazole (PROTONIX) 40 MG tablet Take 40 mg by mouth daily. 05/23/20  Yes [provider]  polyethylene glycol powder (GLYCOLAX/MIRALAX) 17 GM/SCOOP powder Take 17 g by mouth daily.   Yes [provider]  primidone (MYSOLINE) 250 MG tablet Take 250 mg by mouth 2 (two) times daily. 05/23/20  Yes [provider]  rosuvastatin (CRESTOR) 5 MG tablet Take 5 mg by mouth daily. 05/14/20  Yes [provider]  Skin Protectants, Misc. (EUCERIN) cream Apply 1 application topically in the morning and at bedtime.   Yes [provider]  Vitamin D, Ergocalciferol, (DRISDOL) 1.25 MG (50000 UNIT) CAPS capsule Take 50,000 Units by mouth every Friday. 03/08/20  Yes [provider]  diphenhydrAMINE (SOMINEX) 25 MG tablet Take 25 mg by mouth 2 (two) times daily as needed for itching. Patient not taking: Reported on  06/06/2020    [provider]    Allergies    Codeine and Statins  Review of Systems   Review of Systems  Constitutional: Positive for appetite change, fatigue and fever.  HENT: Negative for congestion.   Eyes: Negative for visual disturbance.  Respiratory: Positive for cough (chronic) and shortness of breath.   Cardiovascular: Negative for chest pain.  Gastrointestinal: Negative for abdominal pain, diarrhea, nausea and vomiting.  Genitourinary: Positive for dysuria.  Musculoskeletal: Positive for arthralgias, back pain (unchanged) and neck pain (unchanged).  Skin: Negative for rash.  Neurological: Positive for headaches. Negative for syncope, speech difficulty, weakness and numbness.    Physical Exam Updated Vital Signs BP (!) 145/52   Pulse 69   Temp (!) 100.4 F (38 C) (Oral)   Resp (!) 23   Ht 5\' 4"  (1.626 m)  Wt 54.4 kg   SpO2 95%   BMI 20.60 kg/m   Physical Exam Vitals and nursing note reviewed.  Constitutional:      General: She is not in acute distress.    Appearance: She is well-developed. She is not diaphoretic.  HENT:     Head: Normocephalic.     Comments: 3cm laceration frontal scalp Eyes:     Conjunctiva/sclera: Conjunctivae normal.  Cardiovascular:     Rate and Rhythm: Normal rate and regular rhythm.     Heart sounds: Normal heart sounds. No murmur heard. No friction rub. No gallop.   Pulmonary:     Effort: Pulmonary effort is normal. No respiratory distress.     Breath sounds: Normal breath sounds. No wheezing or rales.  Abdominal:     General: There is no distension.     Palpations: Abdomen is soft.     Tenderness: There is no abdominal tenderness. There is no guarding.  Musculoskeletal:        General: No tenderness.     Cervical back: Normal range of motion.     Comments: Denies acute tenderness to C/T/L spine Normal ROM at hips, denies pain, no pelvic tenderness Contusion to left knee, pain with ROM, tenderness to left knee and  left ankle Normal pulses  Skin:    General: Skin is warm and dry.     Findings: No erythema or rash.  Neurological:     Mental Status: She is alert and oriented to person, place, and time.     ED Results / Procedures / Treatments   Labs (all labs ordered are listed, but only abnormal results are displayed) Labs Reviewed  CBC WITH DIFFERENTIAL/PLATELET - Abnormal; Notable for the following components:      Result Value   RBC 3.56 (*)    Hemoglobin 10.9 (*)    HCT 34.9 (*)    RDW 15.9 (*)    Platelets 121 (*)    Lymphs Abs 0.5 (*)    All other components within normal limits  PROTIME-INR - Abnormal; Notable for the following components:   Prothrombin Time 16.0 (*)    INR 1.3 (*)    All other components within normal limits  APTT - Abnormal; Notable for the following components:   aPTT 43 (*)    All other components within normal limits  URINALYSIS, ROUTINE W REFLEX MICROSCOPIC - Abnormal; Notable for the following components:   Hgb urine dipstick MODERATE (*)    Protein, ur 30 (*)    Nitrite POSITIVE (*)    Leukocytes,Ua SMALL (*)    Bacteria, UA MANY (*)    All other components within normal limits  COMPREHENSIVE METABOLIC PANEL - Abnormal; Notable for the following components:   Glucose, Bld 151 (*)    Creatinine, Ser 1.27 (*)    Calcium 8.5 (*)    Total Protein 6.3 (*)    GFR, Estimated 42 (*)    All other components within normal limits  I-STAT CHEM 8, ED - Abnormal; Notable for the following components:   Creatinine, Ser 1.30 (*)    Glucose, Bld 145 (*)    Calcium, Ion 1.00 (*)    Hemoglobin 11.6 (*)    HCT 34.0 (*)    All other components within normal limits  RESP PANEL BY RT-PCR (FLU A&B, COVID) ARPGX2  URINE CULTURE  CULTURE, BLOOD (ROUTINE X 2)  CULTURE, BLOOD (ROUTINE X 2)  LACTIC ACID, PLASMA  LACTIC ACID, PLASMA    EKG EKG  Interpretation  Date/Time:  Thursday Jun 06 2020 00:23:39 EDT Ventricular Rate:  66 PR Interval:  200 QRS  Duration: 78 QT Interval:  408 QTC Calculation: 428 R Axis:   22 Text Interpretation: Sinus rhythm Borderline low voltage, extremity leads Abnormal R-wave progression, early transition Nonspecific T abnormalities, lateral leads No previous ECGs available Confirmed by Gareth Morgan [phone]) on 06/06/2020 6:59:08 AM   Radiology DG Ankle Complete Left  Result Date: 06/06/2020 CLINICAL DATA:  Trauma and left ankle pain. EXAM: LEFT ANKLE COMPLETE - 3+ VIEW COMPARISON:  None. FINDINGS: There is no acute fracture or dislocation. The bones are osteopenic. The ankle mortise is intact. The soft tissues are unremarkable. IMPRESSION: No acute fracture or dislocation. Electronically Signed   By: Anner Crete M.D.   On: 06/06/2020 00:53   CT Head Wo Contrast  Result Date: 06/06/2020 CLINICAL DATA:  Trauma. EXAM: CT HEAD WITHOUT CONTRAST CT CERVICAL SPINE WITHOUT CONTRAST TECHNIQUE: Multidetector CT imaging of the head and cervical spine was performed following the standard protocol without intravenous contrast. Multiplanar CT image reconstructions of the cervical spine were also generated. COMPARISON:  None. FINDINGS: CT HEAD FINDINGS Brain: Mild age-related atrophy and chronic microvascular ischemic changes. There is no acute intracranial hemorrhage. No mass effect or midline shift. No extra-axial fluid collection. Vascular: No hyperdense vessel or unexpected calcification. Skull: Normal. Negative for fracture or focal lesion. Sinuses/Orbits: Mild mucoperiosteal thickening of paranasal sinuses. No air-fluid level. The mastoid air cells are clear. Other: Laceration of the scalp over the anterior vertex. CT CERVICAL SPINE FINDINGS Alignment: No acute subluxation. Skull base and vertebrae: No acute fracture.  Osteopenia. Soft tissues and spinal canal: No prevertebral fluid or swelling. No visible canal hematoma. Disc levels: Multilevel degenerative changes with endplate irregularity and disc space narrowing.  Multilevel facet arthropathy. Upper chest: No acute findings. Other: Enlarged nodular thyroid, likely goiter. Clinical correlation is recommended. IMPRESSION: 1. No acute intracranial pathology. Mild age-related atrophy and chronic microvascular ischemic changes. 2. No acute/traumatic cervical spine pathology. Multilevel degenerative changes. Electronically Signed   By: Anner Crete M.D.   On: 06/06/2020 01:08   CT Cervical Spine Wo Contrast  Result Date: 06/06/2020 CLINICAL DATA:  Trauma. EXAM: CT HEAD WITHOUT CONTRAST CT CERVICAL SPINE WITHOUT CONTRAST TECHNIQUE: Multidetector CT imaging of the head and cervical spine was performed following the standard protocol without intravenous contrast. Multiplanar CT image reconstructions of the cervical spine were also generated. COMPARISON:  None. FINDINGS: CT HEAD FINDINGS Brain: Mild age-related atrophy and chronic microvascular ischemic changes. There is no acute intracranial hemorrhage. No mass effect or midline shift. No extra-axial fluid collection. Vascular: No hyperdense vessel or unexpected calcification. Skull: Normal. Negative for fracture or focal lesion. Sinuses/Orbits: Mild mucoperiosteal thickening of paranasal sinuses. No air-fluid level. The mastoid air cells are clear. Other: Laceration of the scalp over the anterior vertex. CT CERVICAL SPINE FINDINGS Alignment: No acute subluxation. Skull base and vertebrae: No acute fracture.  Osteopenia. Soft tissues and spinal canal: No prevertebral fluid or swelling. No visible canal hematoma. Disc levels: Multilevel degenerative changes with endplate irregularity and disc space narrowing. Multilevel facet arthropathy. Upper chest: No acute findings. Other: Enlarged nodular thyroid, likely goiter. Clinical correlation is recommended. IMPRESSION: 1. No acute intracranial pathology. Mild age-related atrophy and chronic microvascular ischemic changes. 2. No acute/traumatic cervical spine pathology. Multilevel  degenerative changes. Electronically Signed   By: Anner Crete M.D.   On: 06/06/2020 01:08   DG Chest Portable 1 View  Result Date: 06/06/2020 CLINICAL  DATA:  Trauma. EXAM: PORTABLE CHEST 1 VIEW COMPARISON:  None. FINDINGS: No focal consolidation, pleural effusion, or pneumothorax. Mild cardiomegaly with central vascular congestion. Osteopenia with degenerative changes of the spine and shoulders. No acute osseous pathology. IMPRESSION: Mild cardiomegaly with central vascular congestion. No focal consolidation. Electronically Signed   By: Anner Crete M.D.   On: 06/06/2020 00:51   DG Knee Complete 4 Views Left  Result Date: 06/06/2020 CLINICAL DATA:  Trauma and left knee pain. EXAM: LEFT KNEE - COMPLETE 4+ VIEW COMPARISON:  None. FINDINGS: There is no acute fracture or dislocation. The bones are osteopenic. There is severe osteoarthritic changes with tricompartmental narrowing and spurring. Trace suprapatellar effusion. The soft tissues are unremarkable. IMPRESSION: 1. No acute fracture or dislocation. 2. Severe osteoarthritis. Electronically Signed   By: Anner Crete M.D.   On: 06/06/2020 00:52    Procedures Procedures   Medications Ordered in ED Medications  lactated ringers bolus 1,000 mL (0 mLs Intravenous Stopped 06/06/20 0315)  cefTRIAXone (ROCEPHIN) 2 g in sodium chloride 0.9 % 100 mL IVPB (0 g Intravenous Stopped 06/06/20 0130)  lidocaine-EPINEPHrine (XYLOCAINE W/EPI) 2 %-1:200000 (PF) injection 10 mL (10 mLs Infiltration Given 06/06/20 0311)  Tdap (BOOSTRIX) injection 0.5 mL (0.5 mLs Intramuscular Given 06/06/20 1610)    ED Course  I have reviewed the triage vital signs and the nursing notes.  Pertinent labs & imaging results that were available during my care of the patient were reviewed by me and considered in my medical decision making (see chart for details).    MDM Rules/Calculators/A&P                           86 year old female with history of Parkinson's  disease, profound resting tremor, hypertension, paroxysmal A. fib on eliquis, type 2 diabetes, hyperlipidemia, recent right traumatic femur fracture, right kidney cancer who presents with concern for fall.  She arrives as a level 2 trauma for fall on anticoagulation.  CT head and CSpine without signs of bleeding.  Labs without leukocytosis, no lactic acidosis.  Cr 1.27 from previous around .9.  Given IV fluid and empiric rocephin.  Normal systolic BP and MAPS, normal mentation, no tachycardia and feel outpatient treatment of UTI/pyelonephritis is appropriate.  Plan on keflex.   XR of knee and ankle without fracture however reports severe pain with bearing weight in knee and has tibial plateau tenderness and will order CT knee for further evaluation.   Care signed out with CT pending. IF no fracture, feel she may be managed as outpatient at [facility] with strict return precautions with antibiotics.   Laceration repaired by [place] PA-C, sutures may be removed in 7 days.    Final Clinical Impression(s) / ED Diagnoses Final diagnoses:  Fall, initial encounter  Fever, unspecified fever cause  Laceration of head without foreign body, unspecified part of head, initial encounter  Acute pyelonephritis    Rx / DC Orders ED Discharge Orders         Ordered    cephALEXin (KEFLEX) 500 MG capsule  3 times daily        06/06/20 9604           Gareth Morgan, MD 06/06/20 [phone]

## 2020-06-06 NOTE — ED Notes (Signed)
Staff from [facility] called to follow up status of patient, [facility] staff informed that patient will be sent back to facility pending reevaluation this morning.

## 2020-06-06 NOTE — ED Notes (Signed)
[name] (daughter) contacted and provided update per pt request at this time

## 2020-06-06 NOTE — ED Provider Notes (Signed)
Patient had a fall at nursing facility.  Evaluation complete.  Patient was ambulated and had knee pain.  Dr. Billy Fischer determined to proceed with CT of the knee to rule out occult tibial plateau fracture.  Plan otherwise for discharge back to a [facility] with Keflex for UTI. Physical Exam  BP (!) 111/37   Pulse (!) 55   Temp (!) 100.4 F (38 C) (Oral)   Resp 17   Ht 5\' 4"  (1.626 m)   Wt 54.4 kg   SpO2 (!) 89%   BMI 20.60 kg/m   Physical Exam  ED Course/Procedures     Procedures  MDM  Patient resting.  Vital signs are stable.  Patient awakens to light voice.  I have reviewed the results of her knee CT.  At this time we discussed that this is most likely a strain in the knee from her fall.  Patient also has significant arthritic changes.  We also reviewed continuing antibiotics after discharge from the emergency department.  Patient voiced understanding.  She requested I would call her daughter.  I have called the number listed in demographics and left message on voicemail with callback contact number.       Charlesetta Shanks, MD 06/06/20 1009

## 2020-06-06 NOTE — ED Triage Notes (Addendum)
Trauma level 2, fall on thinners, [facility]  Pt EMS arrival from [facility], per EMS pt attempting to transfer self from bed to wheelchair, denies LOC, co headache and generalized pain. Hematoma to center forearm, no active bleeding.

## 2020-06-06 NOTE — Progress Notes (Signed)
Orthopedic Tech Progress Note Patient Details:  Kristen Mason 01/26/1873 [id number]  Patient ID: [name], female   DOB: 01/26/1873, 86 y.o.   MRN: [id number]   Kennis Carina 06/06/2020, 12:27 AM trauma

## 2020-06-06 NOTE — ED Notes (Signed)
Patient transported to CT 

## 2020-06-09 LAB — URINE CULTURE: Culture: 20000 — AB

## 2020-06-10 ENCOUNTER — Telehealth: Payer: Self-pay | Admitting: Emergency Medicine

## 2020-06-10 NOTE — Telephone Encounter (Signed)
Post ED Visit - Positive Culture Follow-up  Culture report reviewed by antimicrobial stewardship pharmacist: [facility] Team []  Elenor Quinones, Pharm.D. []  Heide Guile, Pharm.D., BCPS AQ-ID []  Parks Neptune, Pharm.D., BCPS []  Alycia Rossetti, Pharm.D., BCPS []  [place], [place].D., BCPS, AAHIVP []  Legrand Como, Pharm.D., BCPS, AAHIVP []  Salome Arnt, PharmD, BCPS []  Johnnette Gourd, PharmD, BCPS []  Hughes Better, PharmD, BCPS []  Leeroy Cha, PharmD []  Laqueta Linden, PharmD, BCPS []  Albertina Parr, PharmD  [place] Team []  Leodis Sias, PharmD []  Lindell Spar, PharmD []  Royetta Asal, PharmD []  Graylin Shiver, Rph []  Rema Fendt) Glennon Mac, PharmD []  Arlyn Dunning, PharmD []  Netta Cedars, PharmD []  Dia Sitter, PharmD []  Leone Haven, PharmD []  Gretta Arab, PharmD []  Theodis Shove, PharmD []  Peggyann Juba, PharmD []  Reuel Boom, PharmD    Positive urine culture Treated with cephalexin, organism sensitive to the same and no further patient follow-up is required at this time.  Hazle Nordmann 06/10/2020, 8:57 AM

## 2020-06-11 LAB — CULTURE, BLOOD (ROUTINE X 2)
Culture: NO GROWTH
Special Requests: ADEQUATE

## 2021-02-03 ENCOUNTER — Emergency Department (HOSPITAL_COMMUNITY): Payer: Medicare (Managed Care)

## 2021-02-03 ENCOUNTER — Emergency Department (HOSPITAL_COMMUNITY)
Admission: EM | Admit: 2021-02-03 | Discharge: 2021-02-05 | Disposition: A | Discharge status: Skilled Nursing Facility | Payer: Medicare (Managed Care) | Attending: Emergency Medicine | Admitting: Emergency Medicine

## 2021-02-03 DIAGNOSIS — Z7982 Long term (current) use of aspirin: Secondary | ICD-10-CM | POA: Insufficient documentation

## 2021-02-03 DIAGNOSIS — Z7901 Long term (current) use of anticoagulants: Secondary | ICD-10-CM | POA: Diagnosis not present

## 2021-02-03 DIAGNOSIS — E119 Type 2 diabetes mellitus without complications: Secondary | ICD-10-CM | POA: Insufficient documentation

## 2021-02-03 DIAGNOSIS — R509 Fever, unspecified: Secondary | ICD-10-CM | POA: Insufficient documentation

## 2021-02-03 DIAGNOSIS — R197 Diarrhea, unspecified: Secondary | ICD-10-CM | POA: Diagnosis not present

## 2021-02-03 DIAGNOSIS — Z7984 Long term (current) use of oral hypoglycemic drugs: Secondary | ICD-10-CM | POA: Diagnosis not present

## 2021-02-03 DIAGNOSIS — Z7951 Long term (current) use of inhaled steroids: Secondary | ICD-10-CM | POA: Insufficient documentation

## 2021-02-03 DIAGNOSIS — Z79899 Other long term (current) drug therapy: Secondary | ICD-10-CM | POA: Insufficient documentation

## 2021-02-03 DIAGNOSIS — I10 Essential (primary) hypertension: Secondary | ICD-10-CM | POA: Diagnosis not present

## 2021-02-03 DIAGNOSIS — J449 Chronic obstructive pulmonary disease, unspecified: Secondary | ICD-10-CM | POA: Insufficient documentation

## 2021-02-03 DIAGNOSIS — R0981 Nasal congestion: Secondary | ICD-10-CM | POA: Diagnosis not present

## 2021-02-03 DIAGNOSIS — Z20822 Contact with and (suspected) exposure to covid-19: Secondary | ICD-10-CM | POA: Insufficient documentation

## 2021-02-03 DIAGNOSIS — R4182 Altered mental status, unspecified: Secondary | ICD-10-CM | POA: Insufficient documentation

## 2021-02-03 DIAGNOSIS — R410 Disorientation, unspecified: Secondary | ICD-10-CM | POA: Diagnosis present

## 2021-02-03 LAB — CBC WITH DIFFERENTIAL/PLATELET
Abs Immature Granulocytes: 0.02 10*3/uL (ref 0.00–0.07)
Basophils Absolute: 0 10*3/uL (ref 0.0–0.1)
Basophils Relative: 0 %
Eosinophils Absolute: 0.6 10*3/uL — ABNORMAL HIGH (ref 0.0–0.5)
Eosinophils Relative: 11 %
HCT: 33.7 % — ABNORMAL LOW (ref 36.0–46.0)
Hemoglobin: 10.2 g/dL — ABNORMAL LOW (ref 12.0–15.0)
Immature Granulocytes: 0 %
Lymphocytes Relative: 23 %
Lymphs Abs: 1.3 10*3/uL (ref 0.7–4.0)
MCH: 30.5 pg (ref 26.0–34.0)
MCHC: 30.3 g/dL (ref 30.0–36.0)
MCV: 100.9 fL — ABNORMAL HIGH (ref 80.0–100.0)
Monocytes Absolute: 0.5 10*3/uL (ref 0.1–1.0)
Monocytes Relative: 10 %
Neutro Abs: 3 10*3/uL (ref 1.7–7.7)
Neutrophils Relative %: 56 %
Platelets: 153 10*3/uL (ref 150–400)
RBC: 3.34 MIL/uL — ABNORMAL LOW (ref 3.87–5.11)
RDW: 16.1 % — ABNORMAL HIGH (ref 11.5–15.5)
WBC: 5.5 10*3/uL (ref 4.0–10.5)
nRBC: 0 % (ref 0.0–0.2)

## 2021-02-03 LAB — COMPREHENSIVE METABOLIC PANEL
ALT: 15 U/L (ref 0–44)
AST: 24 U/L (ref 15–41)
Albumin: 3.4 g/dL — ABNORMAL LOW (ref 3.5–5.0)
Alkaline Phosphatase: 97 U/L (ref 38–126)
Anion gap: 6 (ref 5–15)
BUN: 24 mg/dL — ABNORMAL HIGH (ref 8–23)
CO2: 26 mmol/L (ref 22–32)
Calcium: 8.3 mg/dL — ABNORMAL LOW (ref 8.9–10.3)
Chloride: 108 mmol/L (ref 98–111)
Creatinine, Ser: 1.28 mg/dL — ABNORMAL HIGH (ref 0.44–1.00)
GFR, Estimated: 41 mL/min — ABNORMAL LOW (ref >60)
Glucose, Bld: 128 mg/dL — ABNORMAL HIGH (ref 70–99)
Potassium: 4.7 mmol/L (ref 3.5–5.1)
Sodium: 140 mmol/L (ref 135–145)
Total Bilirubin: 0.3 mg/dL (ref 0.3–1.2)
Total Protein: 6.1 g/dL — ABNORMAL LOW (ref 6.5–8.1)

## 2021-02-03 NOTE — ED Notes (Signed)
Patient's oxygen saturation noted to be 88% when obtaining vitals. Triage RN Megan notified and 2L placed on patient. Oxygen improved to 96%

## 2021-02-03 NOTE — ED Provider Triage Note (Signed)
Emergency Medicine Provider Triage Evaluation Note  Kristen Mason , a 87 y.o. female  was evaluated in triage.  Patient brought to emergency department by EMS.  EMS reports that patient was febrile and confused at SNF.  EMS reports that patient's daughter said that she is currently being treated with antibiotics for dental infection.  Patient reports that she was having dental pain earlier today and took a pain medication.  Patient states that she feels "woozy and out of it," but she took too much pain medication.  Review of Systems  Positive: Cough Negative: Fever, chills, numbness, weakness, facial asymmetry, dysarthria, chest pain  Physical Exam  BP (!) 154/84 (BP Location: Right Arm)    Pulse 60    Temp 98.2 F (36.8 C) (Oral)    Resp 18    SpO2 93%  Gen:   Awake, no distress, alert and oriented x3 Resp:  Normal effort, lungs clear to auscultation bilaterally MSK:   Moves extremities without difficulty  Other:  Abdomen soft, nondistended, nontender  Medical Decision Making  Medically screening exam initiated at 7:55 PM.  Appropriate orders placed.  Anayi Bricco was informed that the remainder of the evaluation will be completed by another provider, this initial triage assessment does not replace that evaluation, and the importance of remaining in the ED until their evaluation is complete.     Loni Beckwith, [place] 02/03/21 [phone]

## 2021-02-03 NOTE — ED Triage Notes (Signed)
Pt BIB [facility] d/t concerns for low O2 stats and AMS. Daughter concerned she is acting different and was more lethargic. O2 stats at facility 90% and fever T 99.0. HX COPD. Per note antibiotics for recent dental infection. VS RR 22 BP 140 SpO2 90 RA HR 67 CBG 210

## 2021-02-04 ENCOUNTER — Emergency Department (HOSPITAL_COMMUNITY): Payer: Medicare (Managed Care)

## 2021-02-04 ENCOUNTER — Other Ambulatory Visit: Payer: Self-pay

## 2021-02-04 LAB — CBG MONITORING, ED: Glucose-Capillary: 103 mg/dL — ABNORMAL HIGH (ref 70–99)

## 2021-02-04 LAB — RESP PANEL BY RT-PCR (FLU A&B, COVID) ARPGX2
Influenza A by PCR: NEGATIVE
Influenza B by PCR: NEGATIVE
SARS Coronavirus 2 by RT PCR: NEGATIVE

## 2021-02-04 LAB — URINALYSIS, ROUTINE W REFLEX MICROSCOPIC
Bilirubin Urine: NEGATIVE
Glucose, UA: NEGATIVE mg/dL
Ketones, ur: NEGATIVE mg/dL
Nitrite: POSITIVE — AB
Protein, ur: 30 mg/dL — AB
Specific Gravity, Urine: 1.02 (ref 1.005–1.030)
pH: 7.5 (ref 5.0–8.0)

## 2021-02-04 LAB — URINALYSIS, MICROSCOPIC (REFLEX)

## 2021-02-04 MED ORDER — CLONAZEPAM 0.5 MG PO TABS
1.0000 mg | ORAL_TABLET | Freq: Once | ORAL | Status: AC
  Administered 2021-02-04: 1 mg via ORAL
  Filled 2021-02-04: qty 2

## 2021-02-04 MED ORDER — ACETAMINOPHEN 325 MG PO TABS
650.0000 mg | ORAL_TABLET | Freq: Once | ORAL | Status: AC
Start: 2021-02-04 — End: 2021-02-04
  Administered 2021-02-04: 650 mg via ORAL
  Filled 2021-02-04: qty 2

## 2021-02-04 MED ORDER — CEPHALEXIN 500 MG PO CAPS: ORAL_CAPSULE | ORAL | 0 refills | Status: DC

## 2021-02-04 MED ORDER — HYDRALAZINE HCL 25 MG PO TABS
25.0000 mg | ORAL_TABLET | Freq: Once | ORAL | Status: AC
  Administered 2021-02-04: 25 mg via ORAL
  Filled 2021-02-04: qty 1

## 2021-02-04 MED ORDER — AMLODIPINE BESYLATE 5 MG PO TABS
5.0000 mg | ORAL_TABLET | Freq: Once | ORAL | Status: AC
  Administered 2021-02-04: 5 mg via ORAL
  Filled 2021-02-04: qty 1

## 2021-02-04 NOTE — ED Notes (Signed)
Patient placed in recliner for comfort 

## 2021-02-04 NOTE — ED Notes (Signed)
Called [facility] to transport patient to [facility]. Patient is currently second on the list.

## 2021-02-04 NOTE — ED Notes (Signed)
Pt states wearing O2 at night. Placed on 2 L spo2 88-89% while sleeping. Increased to 96% on 2 L O2

## 2021-02-04 NOTE — ED Notes (Signed)
Discussed pt discharge BP 194/70 with Dr Ileene Musa. Ordered hydralazine for BP control.

## 2021-02-04 NOTE — ED Notes (Signed)
Oxygen tank replaced °

## 2021-02-04 NOTE — ED Notes (Addendum)
Attempted report to [facility], no answer.

## 2021-02-04 NOTE — ED Provider Notes (Signed)
[facility] EMERGENCY DEPARTMENT Provider Note   CSN: [id number] Arrival date & time: 02/03/21  1914     History Past Medical History:  Diagnosis Date   Diabetes [facility])    Essential tremor    Fall 06/07/2017   rib fractures   GERD (gastroesophageal reflux disease)    Hypertension    Peripheral neuropathy     Chief Complaint  Patient presents with   Fever    Kristen Mason is a 87 y.o. female.  Patient is a 87 y.o F with a PMH of COPD coming in from a SNF due to concern for AMS. Patient says that she was feeling "funny" and not right in her head and her daughter was concerned so they had her come in the ER. She denies any fever, shortness of breath, dysuria, nausea/vomiting, or chest pain. She says that she has been having increased sinus congestion and a cough over the last several days. Did have some diarrhea earlier last week but this resolved. She has also been taking medications for a dental problem that she has around this time period.   Speaking with the daughter, she says that patient did get an opioid medication for her tooth pain this week but she has been still intermittently confused despite not having had more doses of this. She has also recently been started on oxygen which is new for her. She was not wearing the oxygen when the daughter found her more confused at the nursing home. She says her mom can tell when she has a UTI and has not bene complaining of those symptoms.        Home Medications Prior to Admission medications   Medication Sig Start Date End Date Taking? Authorizing Provider  cephALEXin (KEFLEX) 500 MG capsule Take 500 mg three times a day for 5-7 days 02/04/21  Yes Fredrich Cory, MD  acetaminophen (TYLENOL) 325 MG tablet Take 2 tablets (650 mg total) by mouth every 6 (six) hours as needed for mild pain (or Fever >/= 101). 11/09/18   Ainsley Spinner, PA-C  acetaminophen (TYLENOL) 325 MG tablet Take 650 mg by mouth every 6 (six) hours as  needed for mild pain or fever.    [provider]  amLODipine (NORVASC) 5 MG tablet Take 5 mg by mouth daily.    [provider]  amLODipine (NORVASC) 5 MG tablet Take 5 mg by mouth daily. 05/27/20   [provider]  apixaban (ELIQUIS) 5 MG TABS tablet Take 1 tablet (5 mg total) by mouth 2 (two) times daily. 11/10/18 02/05/19  Marcell Anger, MD  aspirin EC 81 MG tablet Take 81 mg by mouth daily.    [provider]  aspirin EC 81 MG tablet Take 81 mg by mouth daily. Swallow whole.    [provider]  bisacodyl (DULCOLAX) 10 MG suppository Place 1 suppository (10 mg total) rectally daily as needed for moderate constipation. 11/10/18   Spongberg, Audie Pinto, MD  busPIRone (BUSPAR) 5 MG tablet Take 5 mg by mouth 3 (three) times daily. 05/25/20   [provider]  calcium citrate (CALCITRATE - DOSED IN MG ELEMENTAL CALCIUM) 950 MG tablet Take 1 tablet (200 mg of elemental calcium total) by mouth 2 (two) times daily. 11/04/18   Ainsley Spinner, PA-C  CALCIUM CITRATE PO Take 1,000 mg by mouth in the morning and at bedtime.    [provider]  carbidopa-levodopa (SINEMET IR) 25-100 MG tablet Take 1 tablet by mouth 3 (three)  times daily.    [provider]  carbidopa-levodopa (SINEMET IR) 25-100 MG tablet Take 1 tablet by mouth 3 (three) times daily. 05/14/20   [provider]  carboxymethylcellulose (REFRESH TEARS) 0.5 % SOLN Place 1 drop into both eyes 4 (four) times daily.    [provider]  Carboxymethylcellulose Sodium (REFRESH TEARS OP) Place 1 drop into both eyes in the morning, at noon, in the evening, and at bedtime.    [provider]  cetirizine (ZYRTEC) 10 MG tablet Take 10 mg by mouth daily.    [provider]  cholecalciferol (VITAMIN D) 25 MCG tablet Take 2 tablets (2,000 Units total) by mouth 2 (two) times daily. 11/04/18   Ainsley Spinner, PA-C  Cholecalciferol (VITAMIN D-3) 125 MCG  (5000 UT) TABS Take 1 tablet by mouth daily.    [provider]  citalopram (CELEXA) 40 MG tablet Take 40 mg by mouth daily.    [provider]  citalopram (CELEXA) 40 MG tablet Take 40 mg by mouth daily. 05/17/20   [provider]  clonazePAM (KLONOPIN) 1 MG tablet Take 1 tablet (1 mg total) by mouth at bedtime. 06/11/17   Ghimire, Henreitta Leber, MD  clonazePAM (KLONOPIN) 1 MG tablet Take 1 mg by mouth at bedtime.    [provider]  CRANBERRY PO Take 1 tablet by mouth 2 (two) times daily.    [provider]  CRANBERRY PO Take 2 tablets by mouth in the morning and at bedtime.    [provider]  diclofenac Sodium (VOLTAREN) 1 % GEL Apply 4 g topically in the morning, at noon, and at bedtime. Both knees 01/03/20   [provider]  dicyclomine (BENTYL) 10 MG capsule Take 10 mg by mouth 4 (four) times daily - after meals and at bedtime.    [provider]  dicyclomine (BENTYL) 10 MG capsule Take 10 mg by mouth 4 (four) times daily -  before meals and at bedtime. 05/23/20   [provider]  diphenhydrAMINE (SOMINEX) 25 MG tablet Take 25 mg by mouth 2 (two) times daily as needed for itching. Patient not taking: Reported on 06/06/2020    [provider]  docusate sodium (COLACE) 100 MG capsule Take 1 capsule (100 mg total) by mouth 2 (two) times daily. 11/10/18   Spongberg, Audie Pinto, MD  docusate sodium (COLACE) 100 MG capsule Take 100 mg by mouth 2 (two) times daily.    [provider]  ELIQUIS 5 MG TABS tablet Take 5 mg by mouth 2 (two) times daily. 05/30/20   [provider]  ferrous sulfate 325 (65 FE) MG tablet Take 1 tablet (325 mg total) by mouth daily with breakfast. 11/11/18   Spongberg, Audie Pinto, MD  fluticasone (FLONASE) 50 MCG/ACT nasal spray Place 1 spray into both nostrils daily. 04/30/20   [provider]  gabapentin (NEURONTIN) 800 MG tablet Take 800 mg by mouth 3 (three)  times daily.    [provider]  gabapentin (NEURONTIN) 800 MG tablet Take 800 mg by mouth 3 (three) times daily. 05/11/20   [provider]  glimepiride (AMARYL) 1 MG tablet Take 1 mg by mouth daily. 06/04/18   [provider]  glipiZIDE (GLUCOTROL) 5 MG tablet Take 5 mg by mouth daily. 05/23/20   [provider]  guaifenesin (HUMIBID E) 400 MG TABS tablet Take 400 mg by mouth in the morning and at bedtime.    [provider]  HYDROcodone-acetaminophen (NORCO/VICODIN) 5-325 MG  tablet Take 1 tablet by mouth every 8 (eight) hours as needed for moderate pain. 04/24/20   [provider]  hydrOXYzine (ATARAX/VISTARIL) 25 MG tablet Take 25 mg by mouth at bedtime.    [provider]  hydrOXYzine (ATARAX/VISTARIL) 25 MG tablet Take 25 mg by mouth every 8 (eight) hours as needed for itching. 06/02/20   [provider]  lidocaine (LIDODERM) 5 % Place 1 patch onto the skin See admin instructions. Apply to lower back in the morning and remove at bedtime.    [provider]  Lidocaine-Glycerin (PREPARATION H EX) Place 1 application rectally every 6 (six) hours as needed (inflammation).    [provider]  NYSTATIN powder Apply 1 application topically as needed (yeast). 05/09/20   [provider]  Omega-3 Fatty Acids (FISH OIL) 1000 MG CPDR Take 1,000 mg by mouth daily.    [provider]  ondansetron (ZOFRAN-ODT) 4 MG disintegrating tablet Take 1 tablet (4 mg total) by mouth every 8 (eight) hours as needed for nausea or vomiting. 11/10/18   Spongberg, Audie Pinto, MD  pantoprazole (PROTONIX) 40 MG tablet Take 40 mg by mouth daily.    [provider]  pantoprazole (PROTONIX) 40 MG tablet Take 40 mg by mouth daily. 05/23/20   [provider]  polyethylene glycol powder (GLYCOLAX/MIRALAX) 17 GM/SCOOP powder Take 17 g by mouth daily.    [provider]  primidone (MYSOLINE) 250 MG tablet  Take 250 mg by mouth 2 (two) times daily.    [provider]  primidone (MYSOLINE) 250 MG tablet Take 250 mg by mouth 2 (two) times daily. 05/23/20   [provider]  rosuvastatin (CRESTOR) 5 MG tablet Take 5 mg by mouth every evening. 01/26/19   [provider]  rosuvastatin (CRESTOR) 5 MG tablet Take 5 mg by mouth daily. 05/14/20   [provider]  Skin Protectants, Misc. (EUCERIN) cream Apply 1 application topically in the morning and at bedtime.    [provider]  traMADol (ULTRAM) 50 MG tablet Take 50 mg by mouth every 6 (six) hours as needed for moderate pain.    [provider]  vitamin C (VITAMIN C) 500 MG tablet Take 1 tablet (500 mg total) by mouth daily. Patient taking differently: Take 500 mg by mouth 2 (two) times daily.  11/05/18   Ainsley Spinner, PA-C  Vitamin D, Ergocalciferol, (DRISDOL) 1.25 MG (50000 UNIT) CAPS capsule Take 50,000 Units by mouth every Friday. 03/08/20   [provider]  Vitamin D, Ergocalciferol, (DRISDOL) 1.25 MG (50000 UT) CAPS capsule Take 1 capsule (50,000 Units total) by mouth every 7 (seven) days. 11/11/18   Ainsley Spinner, PA-C  Zinc Sulfate 220 (50 Zn) MG TABS Take 1 tablet by mouth daily.    [provider]      Allergies    Codeine, Statins, and Statins    Review of Systems   Review of Systems  Constitutional:  Positive for activity change and fatigue. Negative for fever.  Respiratory:  Positive for cough. Negative for shortness of breath.   Gastrointestinal:  Positive for diarrhea. Negative for nausea and vomiting.  Psychiatric/Behavioral:  Positive for confusion.    Physical Exam Updated Vital Signs BP (!) 165/59    Pulse 61    Temp 98.7 F (37.1 C) (Oral)    Resp 20    SpO2 96%  Physical Exam Constitutional:      Appearance: Normal appearance. She is not ill-appearing.  HENT:  Head: Normocephalic and atraumatic.  Eyes:     Extraocular Movements: Extraocular movements  intact.     Pupils: Pupils are equal, round, and reactive to light.  Cardiovascular:     Rate and Rhythm: Normal rate and regular rhythm.  Pulmonary:     Effort: Pulmonary effort is normal.     Breath sounds: Normal breath sounds.  Abdominal:     Palpations: Abdomen is soft.     Tenderness: There is no abdominal tenderness.  Skin:    General: Skin is warm and dry.  Neurological:     Mental Status: She is alert and oriented to person, place, and time. Mental status is at baseline.     Cranial Nerves: No cranial nerve deficit.     Motor: Tremor present.  Psychiatric:        Mood and Affect: Mood normal.        Thought Content: Thought content normal.    ED Results / Procedures / Treatments   Labs (all labs ordered are listed, but only abnormal results are displayed) Labs Reviewed  COMPREHENSIVE METABOLIC PANEL - Abnormal; Notable for the following components:      Result Value   Glucose, Bld 128 (*)    BUN 24 (*)    Creatinine, Ser 1.28 (*)    Calcium 8.3 (*)    Total Protein 6.1 (*)    Albumin 3.4 (*)    GFR, Estimated 41 (*)    All other components within normal limits  CBC WITH DIFFERENTIAL/PLATELET - Abnormal; Notable for the following components:   RBC 3.34 (*)    Hemoglobin 10.2 (*)    HCT 33.7 (*)    MCV 100.9 (*)    RDW 16.1 (*)    Eosinophils Absolute 0.6 (*)    All other components within normal limits  URINALYSIS, ROUTINE W REFLEX MICROSCOPIC - Abnormal; Notable for the following components:   APPearance HAZY (*)    Hgb urine dipstick SMALL (*)    Protein, ur 30 (*)    Nitrite POSITIVE (*)    Leukocytes,Ua SMALL (*)    All other components within normal limits  URINALYSIS, MICROSCOPIC (REFLEX) - Abnormal; Notable for the following components:   Bacteria, UA MANY (*)    All other components within normal limits  CBG MONITORING, ED - Abnormal; Notable for the following components:   Glucose-Capillary 103 (*)    All other components within normal limits   RESP PANEL BY RT-PCR (FLU A&B, COVID) [id number]  URINE CULTURE    EKG EKG Interpretation  Date/Time:  Monday February 03 2021 20:05:45 EST Ventricular Rate:  48 PR Interval:  190 QRS Duration: 66 QT Interval:  500 QTC Calculation: 446 R Axis:   107 Text Interpretation: Sinus bradycardia with sinus arrhythmia Rightward axis Low voltage QRS Septal infarct , age undetermined Abnormal ECG Poor data quality Confirmed by Sherwood Gambler [phone]) on 02/04/2021 9:54:36 AM  Radiology CT Head Wo Contrast  Result Date: 02/04/2021 CLINICAL DATA:  Lethargy altered EXAM: CT HEAD WITHOUT CONTRAST TECHNIQUE: Contiguous axial images were obtained from the base of the skull through the vertex without intravenous contrast. RADIATION DOSE REDUCTION: This exam was performed according to the departmental dose-optimization program which includes automated exposure control, adjustment of the mA and/or kV according to patient size and/or use of iterative reconstruction technique. COMPARISON:  CT brain 10/31/2018 FINDINGS: Brain: No acute territorial infarction, hemorrhage, or intracranial mass. Mild to moderate atrophy. Mild chronic small vessel ischemic changes of the white  matter. Stable ventricle size. Vascular: No hyperdense vessels.  Carotid vascular calcification Skull: Normal. Negative for fracture or focal lesion. Sinuses/Orbits: No acute finding. Other: None IMPRESSION: 1. No CT evidence for acute intracranial abnormality. 2. Atrophy and chronic small vessel ischemic changes of the white matter Electronically Signed   By: Donavan Foil M.D.   On: 02/04/2021 21:13    Procedures Procedures    Medications Ordered in ED Medications  amLODipine (NORVASC) tablet 5 mg (5 mg Oral Given 02/04/21 2022)  acetaminophen (TYLENOL) tablet 650 mg (650 mg Oral Given 02/04/21 2214)  hydrALAZINE (APRESOLINE) tablet 25 mg (25 mg Oral Given 02/04/21 2244)  clonazePAM (KLONOPIN) tablet 1 mg (1 mg Oral Given 02/04/21 2314)    ED  Course/ Medical Decision Making/ A&P                           Medical Decision Making  Patient coming in with reports of AMS and fever, however temperature does not meet true fever criteria, Tmax 99. She has also been reportedly hypoxic however per daughter she was recently put on oxygen so this is not new with this presentation. Patient's labs were reviewed and she has no leukocytosis, kidney function at baseline, no liver enzyme elevations. No abdominal pain or dysuria to suggest infection. CXR showed mild vascular congestion but patient does not appear volume overloaded on exam and is complaining of no shortness of breath. COVID test was negative. CT Head was negative for acute pathology. UA was concerning for possible UTI. Prescribed Kefflex and will have patient follow up with her primary care doctor this week.    Final Clinical Impression(s) / ED Diagnoses Final diagnoses:  Altered mental status, unspecified altered mental status type    Rx / DC Orders ED Discharge Orders          Ordered    cephALEXin (KEFLEX) 500 MG capsule        02/04/21 2202              Scarlett Presto, MD 02/06/21 0703    Lucrezia Starch, MD 02/06/21 1203

## 2021-02-04 NOTE — Discharge Instructions (Addendum)
Kristen Mason  You were seen at [facility] Emergency Department for confusion. We did lab works which came back normal and took pictures of your brain which did not show anything scary such as a stroke. Your urine did show some bacteria which is concerning for a possible UTI. We are going to prescribe you antibiotics and recommend that you follow up with your regular doctor to see if you need to take both antibiotics.

## 2021-02-04 NOTE — ED Notes (Signed)
[phone] [facility] would like update

## 2021-02-04 NOTE — ED Notes (Signed)
Patient brief changed.

## 2021-02-04 NOTE — ED Notes (Signed)
Per EDP Dykrstra and Demaio pt cleared for discharge with SBP less than 200.

## 2021-02-04 NOTE — ED Notes (Signed)
Patient transported to CT 

## 2021-02-04 NOTE — ED Notes (Signed)
Secretary requested to call [facility] for pt to return to facility

## 2021-02-05 NOTE — ED Notes (Signed)
Attempted to contact [facility]. No answer.

## 2021-02-05 NOTE — ED Notes (Addendum)
Contacted [facility] to check on status of patients transportation. Was told she is next on the list.

## 2021-02-05 NOTE — ED Notes (Signed)
[facility]  Breakfast Ordered

## 2021-02-07 LAB — URINE CULTURE: Culture: >=100000 — AB

## 2021-02-08 ENCOUNTER — Telehealth (HOSPITAL_BASED_OUTPATIENT_CLINIC_OR_DEPARTMENT_OTHER): Payer: Self-pay | Admitting: Emergency Medicine

## 2021-02-08 NOTE — Progress Notes (Signed)
ED Antimicrobial Stewardship Positive Culture Follow Up   Kristen Mason is an 87 y.o. female who presented to [facility] on 02/03/2021 with a chief complaint of  Chief Complaint  Patient presents with   Fever    Recent Results (from the past 720 hour(s))  Resp Panel by RT-PCR (Flu A&B, Covid) Nasopharyngeal Swab     Status: None   Collection Time: 02/04/21  5:01 PM   Specimen: Nasopharyngeal Swab; Nasopharyngeal(NP) swabs in vial transport medium  Result Value Ref Range Status   SARS Coronavirus 2 by RT PCR NEGATIVE NEGATIVE Final    Comment: (NOTE) SARS-CoV-2 target nucleic acids are NOT DETECTED.  The SARS-CoV-2 RNA is generally detectable in upper respiratory specimens during the acute phase of infection. The lowest concentration of SARS-CoV-2 viral copies this assay can detect is 138 copies/mL. A negative result does not preclude SARS-Cov-2 infection and should not be used as the sole basis for treatment or other patient management decisions. A negative result may occur with  improper specimen collection/handling, submission of specimen other than nasopharyngeal swab, presence of viral mutation(s) within the areas targeted by this assay, and inadequate number of viral copies(<138 copies/mL). A negative result must be combined with clinical observations, patient history, and epidemiological information. The expected result is Negative.  Fact Sheet for Patients:  EntrepreneurPulse.com.au  Fact Sheet for Healthcare Providers:  IncredibleEmployment.be  This test is no t yet approved or cleared by the [place] FDA and  has been authorized for detection and/or diagnosis of SARS-CoV-2 by FDA under an Emergency Use Authorization (EUA). This EUA will remain  in effect (meaning this test can be used) for the duration of the COVID-19 declaration under Section 564(b)(1) of the Act, 21 U.S.C.section 360bbb-3(b)(1), unless the authorization is  terminated  or revoked sooner.       Influenza A by PCR NEGATIVE NEGATIVE Final   Influenza B by PCR NEGATIVE NEGATIVE Final    Comment: (NOTE) The Xpert Xpress SARS-CoV-2/FLU/RSV plus assay is intended as an aid in the diagnosis of influenza from Nasopharyngeal swab specimens and should not be used as a sole basis for treatment. Nasal washings and aspirates are unacceptable for Xpert Xpress SARS-CoV-2/FLU/RSV testing.  Fact Sheet for Patients: EntrepreneurPulse.com.au  Fact Sheet for Healthcare Providers: IncredibleEmployment.be  This test is not yet approved or cleared by the [place] FDA and has been authorized for detection and/or diagnosis of SARS-CoV-2 by FDA under an Emergency Use Authorization (EUA). This EUA will remain in effect (meaning this test can be used) for the duration of the COVID-19 declaration under Section 564(b)(1) of the Act, 21 U.S.C. section 360bbb-3(b)(1), unless the authorization is terminated or revoked.  Performed at [facility], [place] [address], [place], [place] [address]   Urine Culture     Status: Abnormal   Collection Time: 02/04/21  6:00 PM   Specimen: Urine, Clean Catch  Result Value Ref Range Status   Specimen Description URINE, CLEAN CATCH  Final   Special Requests   Final    NONE Performed at [facility], [place] [address], [place], [place] [address]    Culture >=100,000 COLONIES/mL PSEUDOMONAS AERUGINOSA (A)  Final   Report Status 02/07/2021 FINAL  Final   Organism ID, Bacteria PSEUDOMONAS AERUGINOSA (A)  Final      Susceptibility   Pseudomonas aeruginosa - MIC*    CEFTAZIDIME 4 SENSITIVE Sensitive     CIPROFLOXACIN <=0.25 SENSITIVE Sensitive     GENTAMICIN <=1 SENSITIVE Sensitive  IMIPENEM 1 SENSITIVE Sensitive     PIP/TAZO 8 SENSITIVE Sensitive     CEFEPIME 2 SENSITIVE Sensitive     * >=100,000 COLONIES/mL PSEUDOMONAS AERUGINOSA    [x]  Treated with cephalexin, organism  resistant to prescribed antimicrobial  New antibiotic prescription: Ciprofloxacin 500mg  q24h x 5 days (Qty 5; Refills 0)  ED Provider: Priscille Heidelberg   Lorelei Pont, PharmD, BCPS 02/08/2021 12:34 PM ED Clinical Pharmacist -  [phone]

## 2021-02-08 NOTE — Telephone Encounter (Signed)
Post ED Visit - Positive Culture Follow-up: Successful Patient Follow-Up  Culture assessed and recommendations reviewed by:  []  Elenor Quinones, Pharm.D. []  Heide Guile, Pharm.D., BCPS AQ-ID []  Parks Neptune, Pharm.D., BCPS []  Alycia Rossetti, Pharm.D., BCPS []  [place], Pharm.D., BCPS, AAHIVP []  Legrand Como, Pharm.D., BCPS, AAHIVP []  Salome Arnt, PharmD, BCPS []  Johnnette Gourd, PharmD, BCPS []  Hughes Better, PharmD, BCPS [x]  Lorelei Pont, PharmD  Positive urine culture  []  Patient discharged without antimicrobial prescription and treatment is now indicated []  Organism is resistant to prescribed ED discharge antimicrobial []  Patient with positive blood cultures  Changes discussed with ED provider: Delia Heady PA New antibiotic prescription Ciprofloxacin 500 mg every 24 hours for five days Called/faxed to [facility] Ph: [phone] Fax:  [phone]  Contacted patient's SNF date 02/08/2021, time [place] 02/08/2021, 5:44 PM

## 2021-02-19 IMAGING — CT CT KNEE*R* W/O CM
1 series · 1 of 3 positions shown · non-contrast
Comparison: Radiographs same day.

CLINICAL DATA: Right knee pain after falling. Evaluate distal femur
fracture.

EXAM:
CT OF THE RIGHT KNEE WITHOUT CONTRAST
TECHNIQUE: Multidetector CT imaging of the right knee was performed according
to the standard protocol. Multiplanar CT image reconstructions were
also generated.

[Series 331: cac score · 0.49mm/px · 1 of 3 slices shown]
[im 2/3]
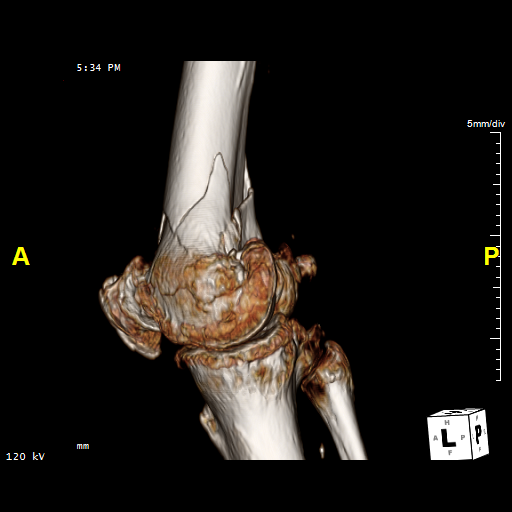

[1 of 3 positions shown; findings below may reference images not displayed]

FINDINGS: Bones/Joint/Cartilage

The bones are diffusely demineralized. There are advanced
tricompartmental degenerative changes of the right knee, most
advanced in the medial compartment. As seen on earlier radiographs,
there is a comminuted and mildly displaced fracture of the distal
femur. This fracture involves the distal metadiaphysis and
demonstrates up to 9 mm of lateral displacement laterally. The
fracture extends into the medial femoral epicondyle, but there is no
involvement of the weight-bearing articular surface or intercondylar
extension.

The patella, proximal tibia and proximal fibula are intact. There is
a small lipohemarthrosis, implying intra-articular extension of the
distal femur fracture.

Ligaments

Suboptimally assessed by CT. The cruciate ligaments are grossly
intact.

Muscles and Tendons

The extensor mechanism is intact.  No focal muscular abnormalities.

Soft tissues

There is a small Baker's cyst containing a loose body. Mild
femoropopliteal atherosclerosis is noted.
IMPRESSION: 1. Comminuted and mildly displaced intra-articular fracture of the
distal femur with associated lipohemarthrosis. This fracture
demonstrates no involvement of the articular surface of either
femoral condyle or intercondylar extension, although does involve
the medial femoral epicondyle.
2. Intact proximal tibia and fibula.
3. Advanced tricompartmental right knee osteoarthritis.

## 2021-02-20 IMAGING — RF DG C-ARM 1-60 MIN
1 series · 5 of 5 positions shown · non-contrast
Comparison: 10/31/2018

CLINICAL DATA: Right femur surgery

EXAM:
RIGHT FEMUR 2 VIEWS; DG C-ARM 1-60 MIN

[Series 1: run · 5 of 5 slices shown]
[im 1/5]
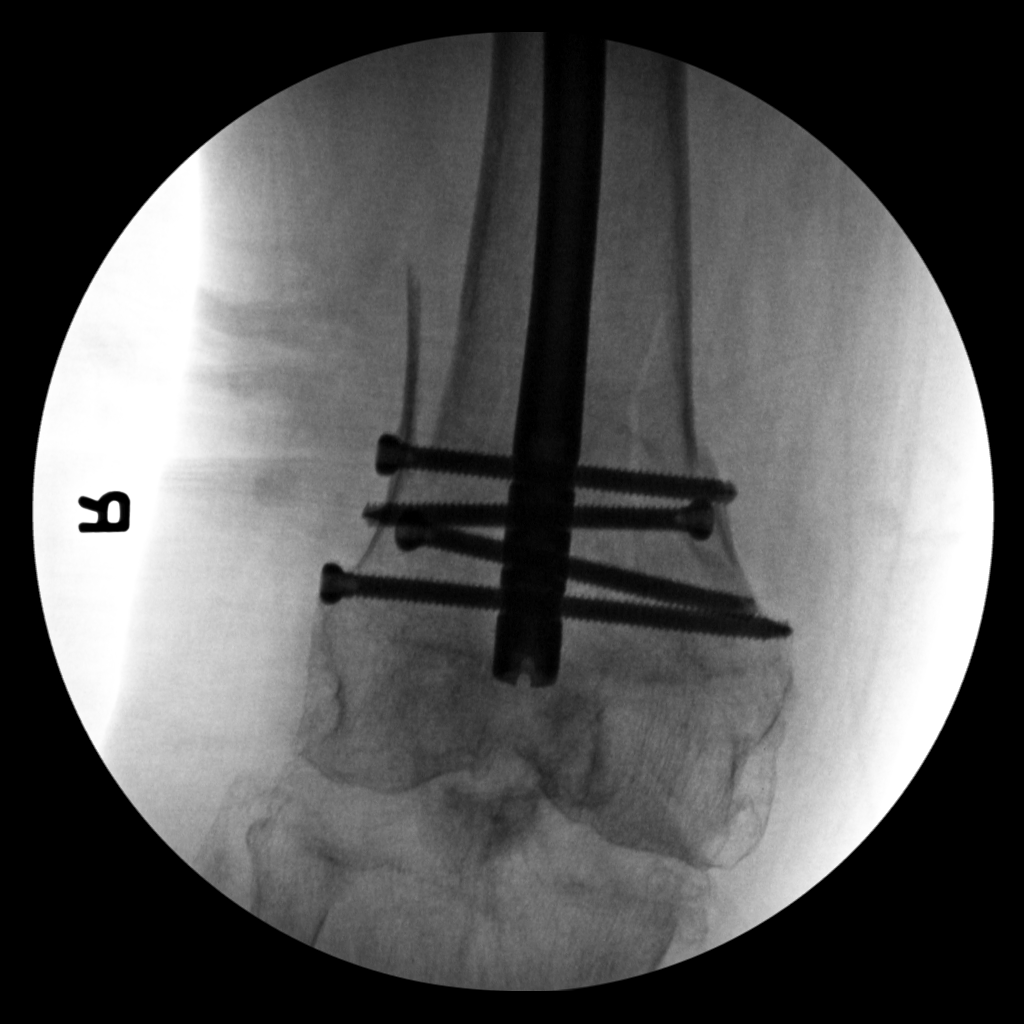
[im 2/5]
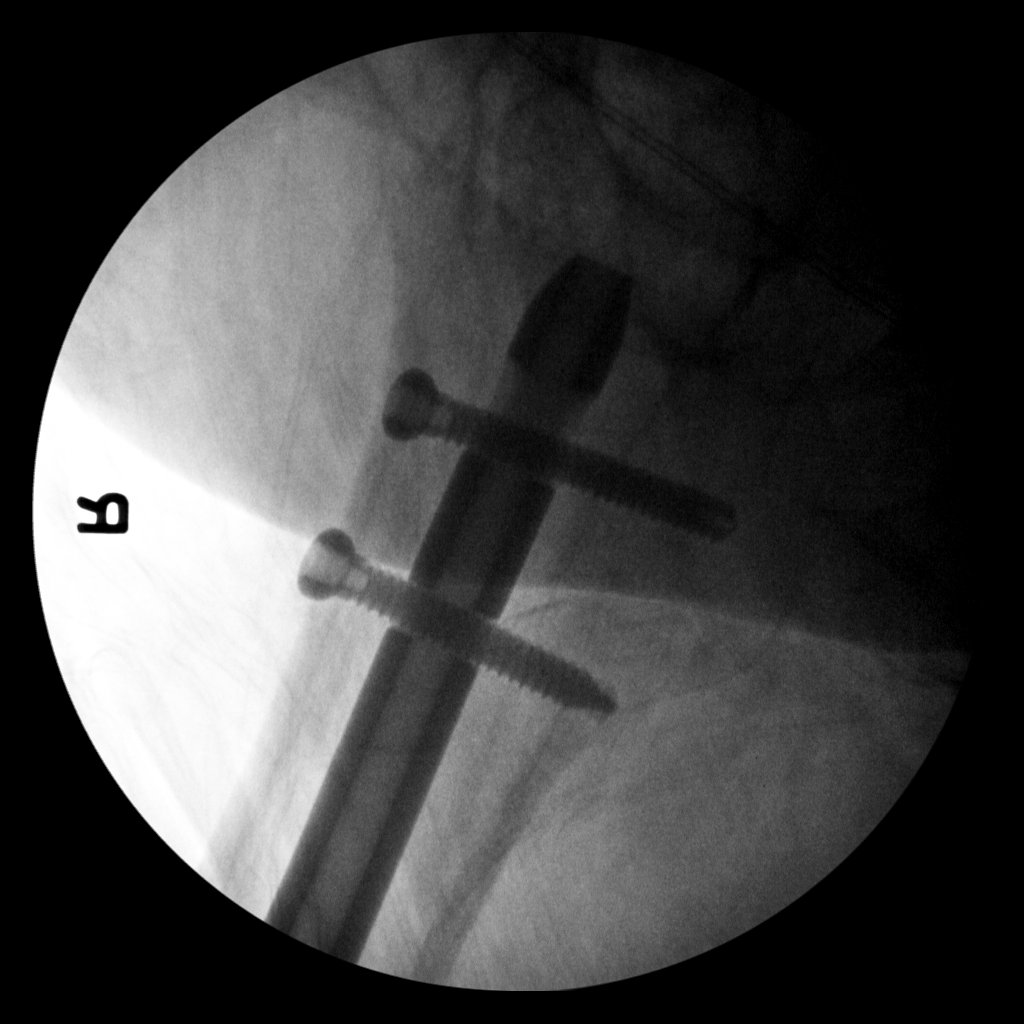
[im 3/5]
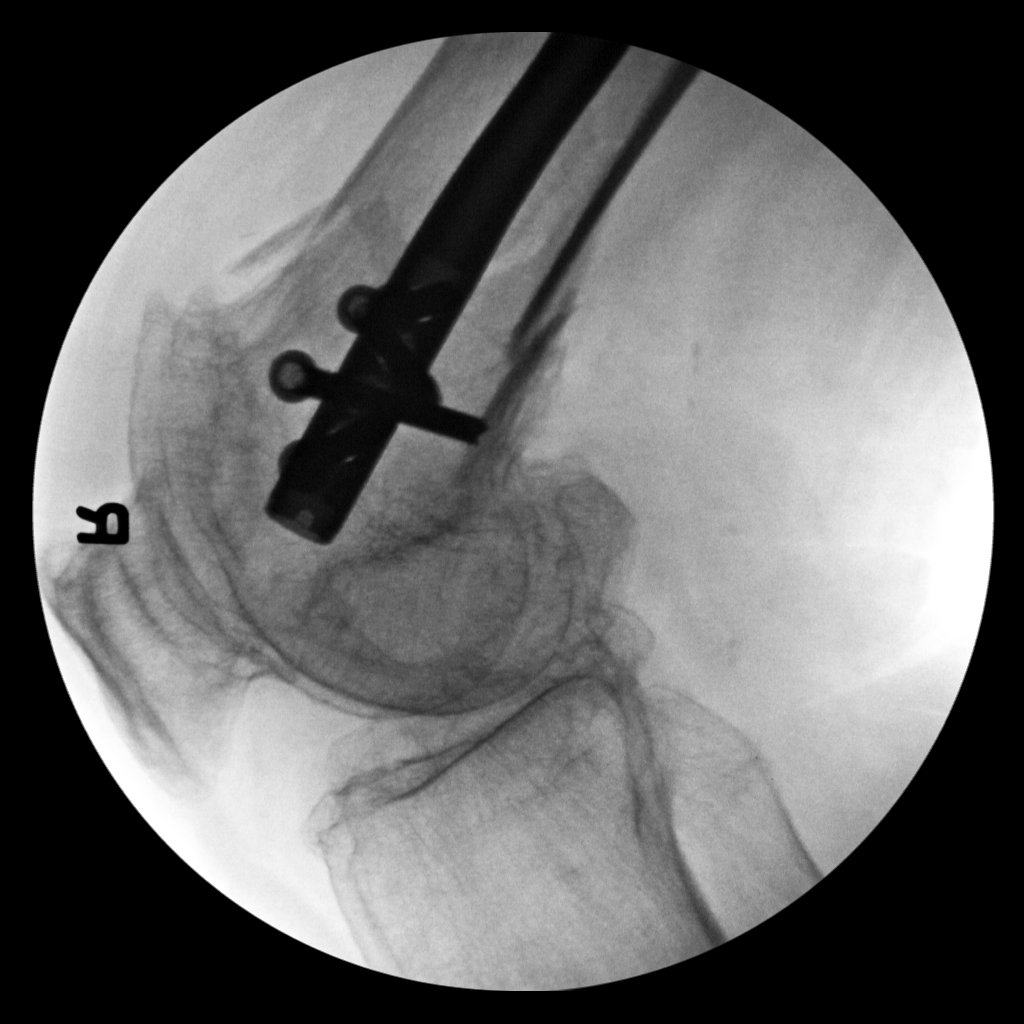
[im 4/5]
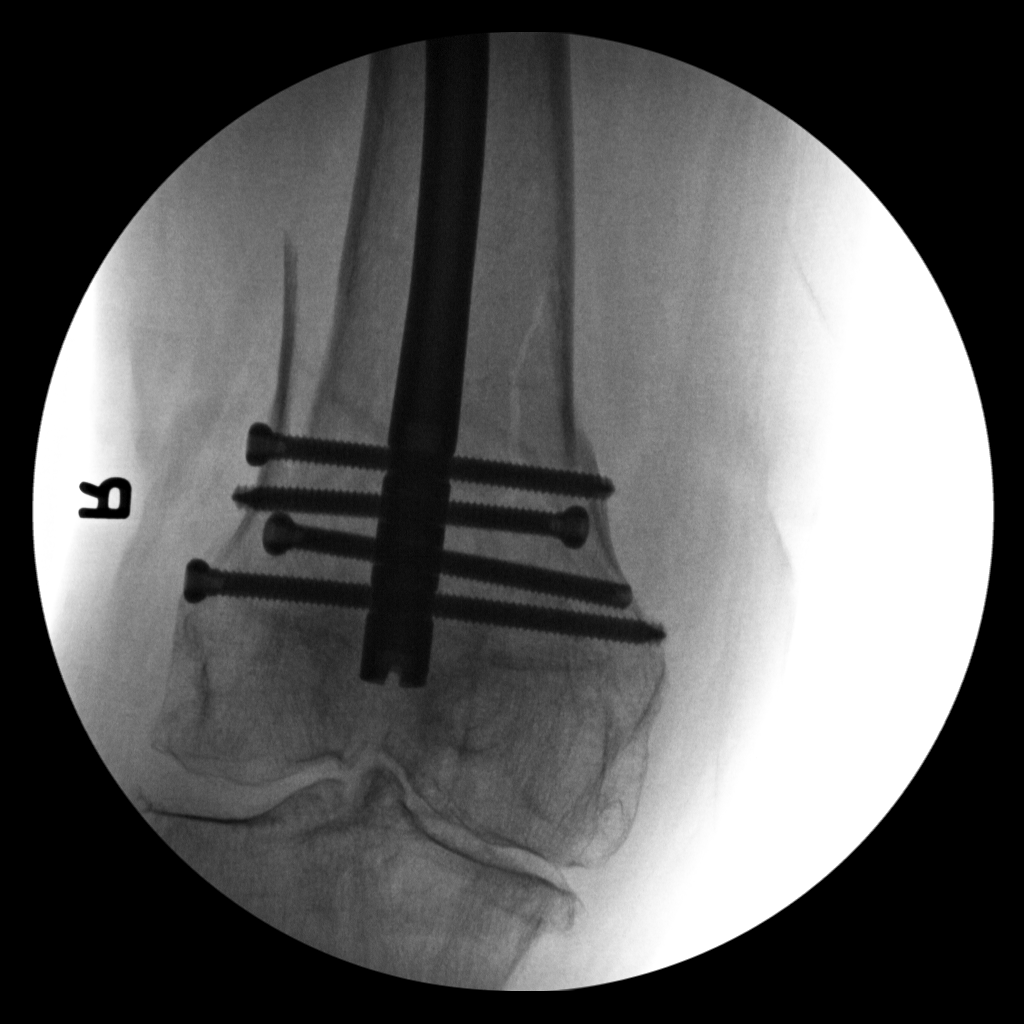
[im 5/5]
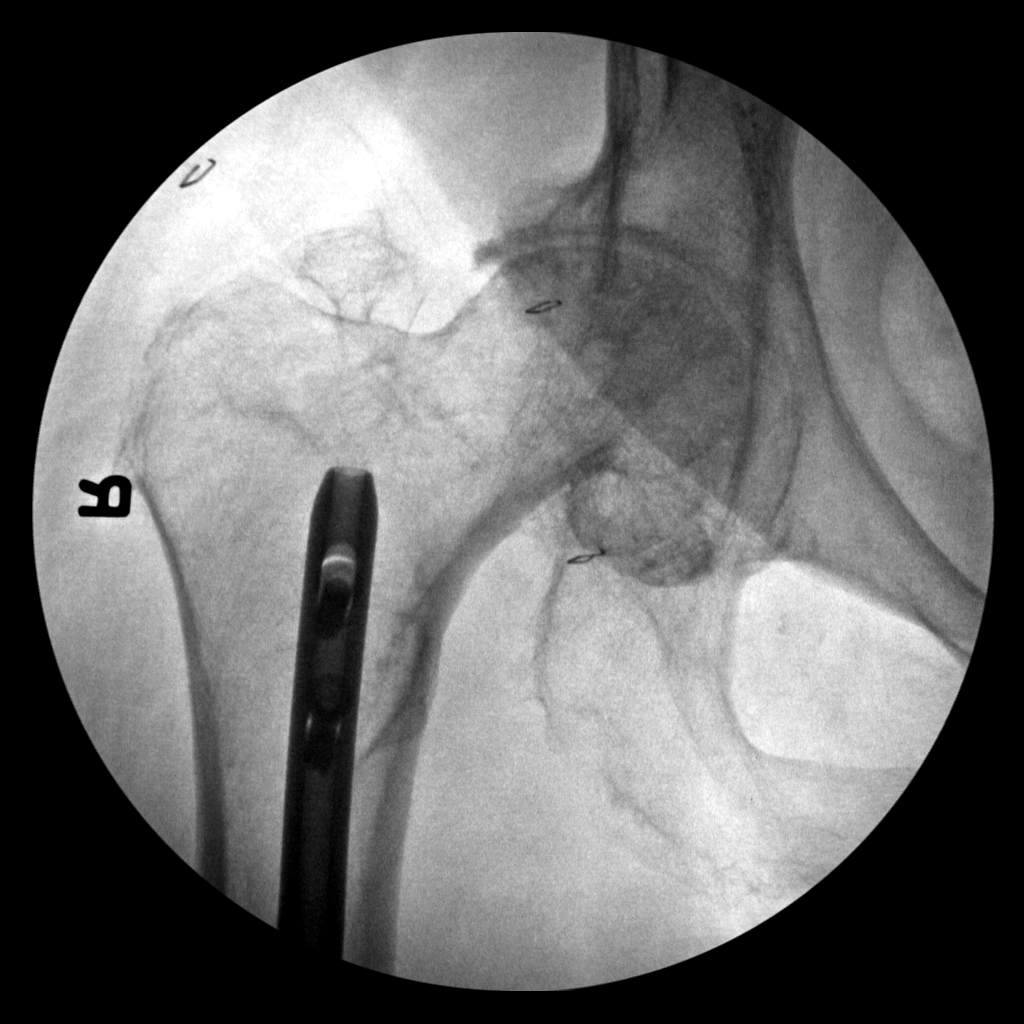

[5 of 5 positions shown; findings below may reference images not displayed]

FINDINGS: Five low resolution intraoperative spot views of the right femur.
Total fluoroscopy time was 1 minutes 28 seconds. The images
demonstrate intramedullary rod and proximal and distal screw
fixation of the right femur for comminuted distal femoral fracture.
IMPRESSION: Intraoperative fluoroscopic assistance provided during surgical
fixation of distal femur fracture

## 2021-02-21 IMAGING — DX DG ABDOMEN 1V
2 series · 2 of 2 positions shown · non-contrast
Comparison: None.

CLINICAL DATA: Abdominal pain.

EXAM:
ABDOMEN - 1 VIEW

[abdomen kub (1 of 2)]
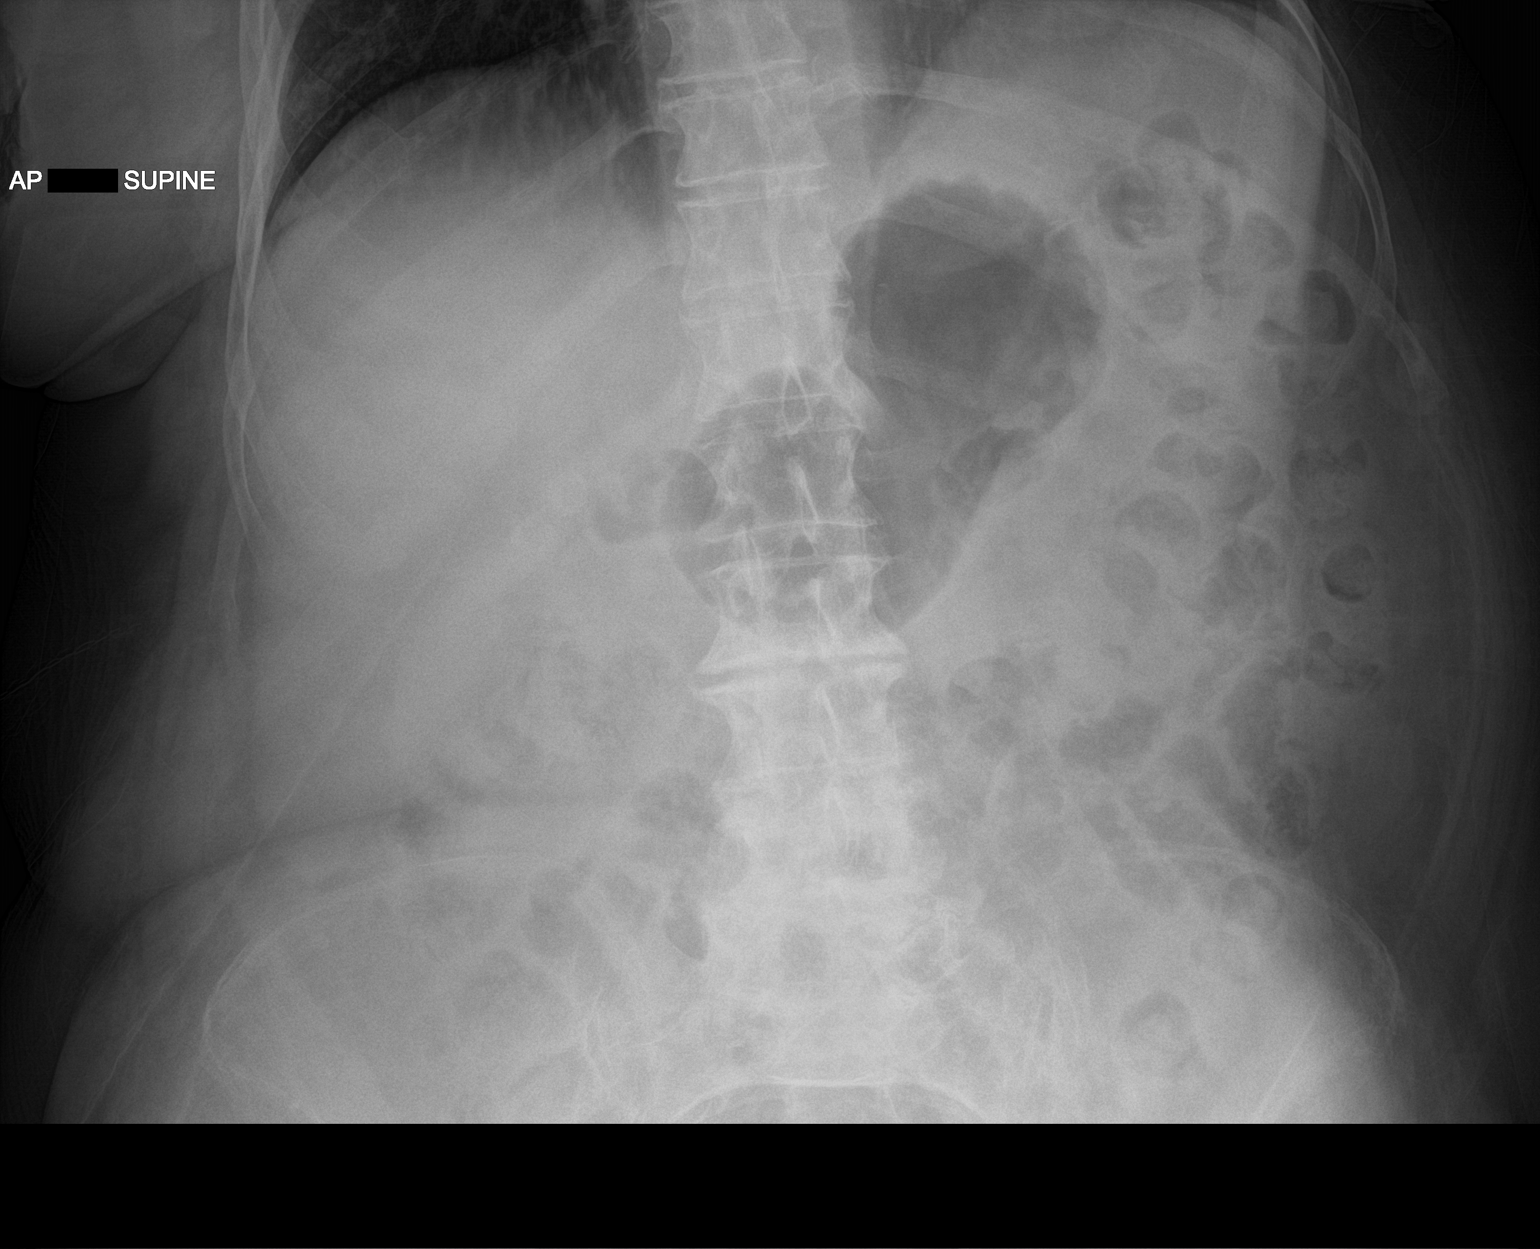

[abdomen kub (2 of 2)]
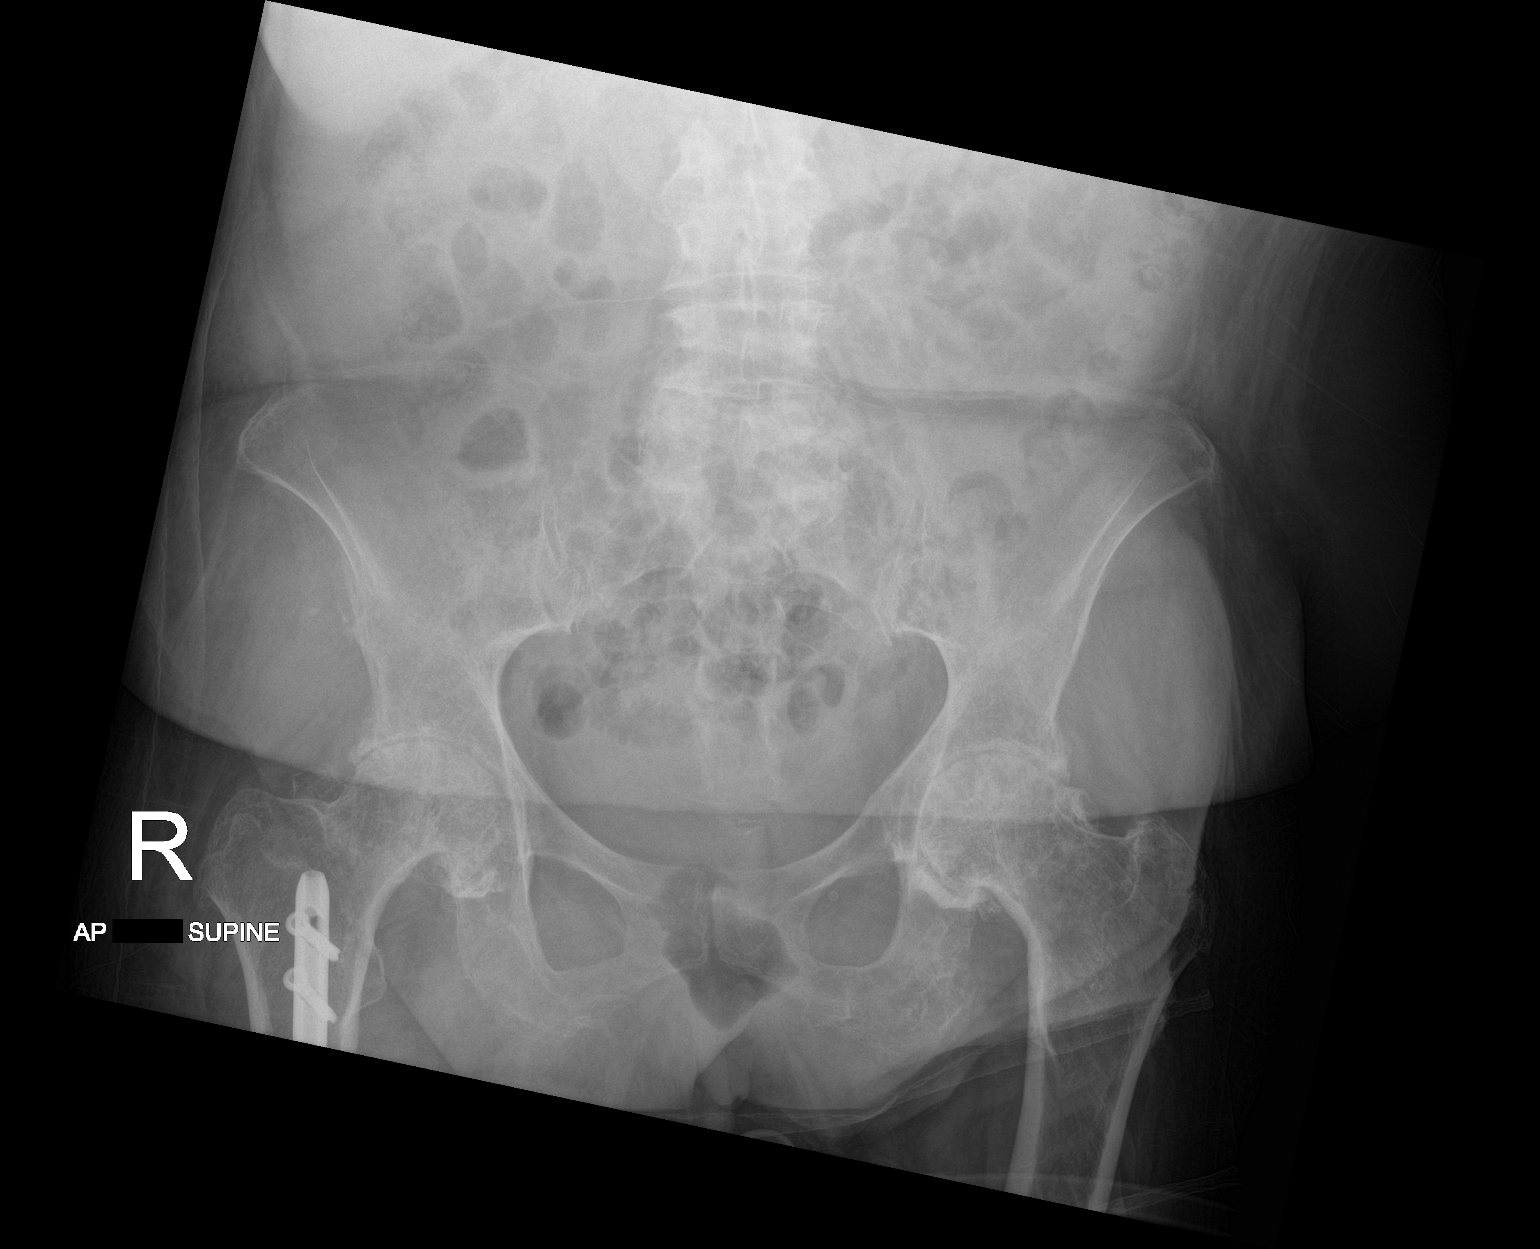

[2 of 2 positions shown; findings below may reference images not displayed]

FINDINGS: Supine abdomen shows no gaseous bowel dilatation to suggest
obstruction. Mild gaseous distention of the stomach observed. No
unexpected abdominopelvic calcification. Degenerative changes noted
in both hips and in the lumbar spine.
IMPRESSION: Nonobstructive bowel gas pattern.

## 2021-07-27 ENCOUNTER — Emergency Department (HOSPITAL_COMMUNITY): Payer: Medicare (Managed Care)

## 2021-07-27 ENCOUNTER — Encounter (HOSPITAL_COMMUNITY): Payer: Self-pay

## 2021-07-27 ENCOUNTER — Emergency Department (HOSPITAL_COMMUNITY)
Admission: EM | Admit: 2021-07-27 | Discharge: 2021-07-27 | Disposition: A | Discharge status: Home / Self Care | Payer: Medicare (Managed Care) | Attending: Student | Admitting: Student

## 2021-07-27 ENCOUNTER — Other Ambulatory Visit: Payer: Self-pay

## 2021-07-27 DIAGNOSIS — Z7982 Long term (current) use of aspirin: Secondary | ICD-10-CM | POA: Insufficient documentation

## 2021-07-27 DIAGNOSIS — Z7901 Long term (current) use of anticoagulants: Secondary | ICD-10-CM | POA: Diagnosis not present

## 2021-07-27 DIAGNOSIS — W1809XA Striking against other object with subsequent fall, initial encounter: Secondary | ICD-10-CM | POA: Diagnosis not present

## 2021-07-27 DIAGNOSIS — Z7951 Long term (current) use of inhaled steroids: Secondary | ICD-10-CM | POA: Diagnosis not present

## 2021-07-27 DIAGNOSIS — N189 Chronic kidney disease, unspecified: Secondary | ICD-10-CM | POA: Diagnosis not present

## 2021-07-27 DIAGNOSIS — S2232XA Fracture of one rib, left side, initial encounter for closed fracture: Secondary | ICD-10-CM | POA: Insufficient documentation

## 2021-07-27 DIAGNOSIS — Z87891 Personal history of nicotine dependence: Secondary | ICD-10-CM | POA: Insufficient documentation

## 2021-07-27 DIAGNOSIS — J441 Chronic obstructive pulmonary disease with (acute) exacerbation: Secondary | ICD-10-CM | POA: Insufficient documentation

## 2021-07-27 DIAGNOSIS — Z79899 Other long term (current) drug therapy: Secondary | ICD-10-CM | POA: Insufficient documentation

## 2021-07-27 DIAGNOSIS — Z7984 Long term (current) use of oral hypoglycemic drugs: Secondary | ICD-10-CM | POA: Insufficient documentation

## 2021-07-27 DIAGNOSIS — G20 Parkinson's disease: Secondary | ICD-10-CM | POA: Insufficient documentation

## 2021-07-27 DIAGNOSIS — E1140 Type 2 diabetes mellitus with diabetic neuropathy, unspecified: Secondary | ICD-10-CM | POA: Insufficient documentation

## 2021-07-27 DIAGNOSIS — S2242XA Multiple fractures of ribs, left side, initial encounter for closed fracture: Secondary | ICD-10-CM

## 2021-07-27 DIAGNOSIS — M79602 Pain in left arm: Secondary | ICD-10-CM | POA: Insufficient documentation

## 2021-07-27 DIAGNOSIS — S0083XA Contusion of other part of head, initial encounter: Secondary | ICD-10-CM | POA: Insufficient documentation

## 2021-07-27 DIAGNOSIS — I129 Hypertensive chronic kidney disease with stage 1 through stage 4 chronic kidney disease, or unspecified chronic kidney disease: Secondary | ICD-10-CM | POA: Insufficient documentation

## 2021-07-27 DIAGNOSIS — Z8616 Personal history of COVID-19: Secondary | ICD-10-CM | POA: Diagnosis not present

## 2021-07-27 DIAGNOSIS — S299XXA Unspecified injury of thorax, initial encounter: Secondary | ICD-10-CM | POA: Diagnosis present

## 2021-07-27 DIAGNOSIS — W19XXXA Unspecified fall, initial encounter: Secondary | ICD-10-CM

## 2021-07-27 LAB — URINALYSIS, ROUTINE W REFLEX MICROSCOPIC
Bilirubin Urine: NEGATIVE
Glucose, UA: NEGATIVE mg/dL
Hgb urine dipstick: NEGATIVE
Ketones, ur: NEGATIVE mg/dL
Leukocytes,Ua: NEGATIVE
Nitrite: NEGATIVE
Protein, ur: NEGATIVE mg/dL
Specific Gravity, Urine: 1.008 (ref 1.005–1.030)
pH: 7 (ref 5.0–8.0)

## 2021-07-27 LAB — CBC WITH DIFFERENTIAL/PLATELET
Abs Immature Granulocytes: 0.02 10*3/uL (ref 0.00–0.07)
Basophils Absolute: 0 10*3/uL (ref 0.0–0.1)
Basophils Relative: 0 %
Eosinophils Absolute: 0.5 10*3/uL (ref 0.0–0.5)
Eosinophils Relative: 10 %
HCT: 34.8 % — ABNORMAL LOW (ref 36.0–46.0)
Hemoglobin: 10.6 g/dL — ABNORMAL LOW (ref 12.0–15.0)
Immature Granulocytes: 0 %
Lymphocytes Relative: 25 %
Lymphs Abs: 1.3 10*3/uL (ref 0.7–4.0)
MCH: 32 pg (ref 26.0–34.0)
MCHC: 30.5 g/dL (ref 30.0–36.0)
MCV: 105.1 fL — ABNORMAL HIGH (ref 80.0–100.0)
Monocytes Absolute: 0.5 10*3/uL (ref 0.1–1.0)
Monocytes Relative: 9 %
Neutro Abs: 2.8 10*3/uL (ref 1.7–7.7)
Neutrophils Relative %: 56 %
Platelets: 146 10*3/uL — ABNORMAL LOW (ref 150–400)
RBC: 3.31 MIL/uL — ABNORMAL LOW (ref 3.87–5.11)
RDW: 14 % (ref 11.5–15.5)
WBC: 5.2 10*3/uL (ref 4.0–10.5)
nRBC: 0 % (ref 0.0–0.2)

## 2021-07-27 LAB — COMPREHENSIVE METABOLIC PANEL
ALT: 10 U/L (ref 0–44)
AST: 15 U/L (ref 15–41)
Albumin: 3.4 g/dL — ABNORMAL LOW (ref 3.5–5.0)
Alkaline Phosphatase: 75 U/L (ref 38–126)
Anion gap: 10 (ref 5–15)
BUN: 23 mg/dL (ref 8–23)
CO2: 25 mmol/L (ref 22–32)
Calcium: 8.5 mg/dL — ABNORMAL LOW (ref 8.9–10.3)
Chloride: 105 mmol/L (ref 98–111)
Creatinine, Ser: 1.2 mg/dL — ABNORMAL HIGH (ref 0.44–1.00)
GFR, Estimated: 45 mL/min — ABNORMAL LOW (ref >60)
Glucose, Bld: 134 mg/dL — ABNORMAL HIGH (ref 70–99)
Potassium: 4.5 mmol/L (ref 3.5–5.1)
Sodium: 140 mmol/L (ref 135–145)
Total Bilirubin: 0.6 mg/dL (ref 0.3–1.2)
Total Protein: 6.6 g/dL (ref 6.5–8.1)

## 2021-07-27 LAB — I-STAT CHEM 8, ED
BUN: 24 mg/dL — ABNORMAL HIGH (ref 8–23)
Calcium, Ion: 1.07 mmol/L — ABNORMAL LOW (ref 1.15–1.40)
Chloride: 106 mmol/L (ref 98–111)
Creatinine, Ser: 1.2 mg/dL — ABNORMAL HIGH (ref 0.44–1.00)
Glucose, Bld: 130 mg/dL — ABNORMAL HIGH (ref 70–99)
HCT: 33 % — ABNORMAL LOW (ref 36.0–46.0)
Hemoglobin: 11.2 g/dL — ABNORMAL LOW (ref 12.0–15.0)
Potassium: 4.5 mmol/L (ref 3.5–5.1)
Sodium: 140 mmol/L (ref 135–145)
TCO2: 25 mmol/L (ref 22–32)

## 2021-07-27 MED ORDER — AMOXICILLIN-POT CLAVULANATE 875-125 MG PO TABS
1.0000 | ORAL_TABLET | Freq: Once | ORAL | Status: AC
  Administered 2021-07-27: 1 via ORAL
  Filled 2021-07-27: qty 1

## 2021-07-27 MED ORDER — AMOXICILLIN-POT CLAVULANATE 875-125 MG PO TABS: 1.0000 | ORAL_TABLET | Freq: Two times a day (BID) | ORAL | 0 refills | Status: AC

## 2021-07-27 MED ORDER — HYDROCODONE-ACETAMINOPHEN 5-325 MG PO TABS
1.0000 | ORAL_TABLET | Freq: Once | ORAL | Status: AC
  Administered 2021-07-27: 1 via ORAL
  Filled 2021-07-27: qty 1

## 2021-07-27 MED ORDER — LACTATED RINGERS IV BOLUS
1000.0000 mL | Freq: Once | INTRAVENOUS | Status: AC
  Administered 2021-07-27: 1000 mL via INTRAVENOUS

## 2021-07-27 MED ORDER — LIDOCAINE 5 % EX PTCH
1.0000 | MEDICATED_PATCH | CUTANEOUS | Status: DC
  Administered 2021-07-27: 1 via TRANSDERMAL
  Filled 2021-07-27: qty 1

## 2021-07-27 MED ORDER — HYDROCODONE-ACETAMINOPHEN 5-325 MG PO TABS: 1.0000 | ORAL_TABLET | Freq: Four times a day (QID) | ORAL | 0 refills | Status: DC | PRN

## 2021-07-27 NOTE — Discharge Instructions (Addendum)
If you develop new or worsening pain, coughing up blood, trouble breathing, or any other new/concerning symptoms then return to the ER for evaluation.  Use the incentive spirometer every hour that you are awake to help keep your lungs open and prevent pneumonia  Your CT scan shows a small area of possible pneumonia and so because of your cough you are being given an antibiotic.

## 2021-07-27 NOTE — ED Provider Notes (Signed)
Care transferred to me.  Urinalysis is unremarkable.  She is feeling like her pain is well controlled.  Vital signs are stable.  We will treat with antibiotics and pain medicine as per prior plan.  She is not sure what pharmacy she uses and [facility], her facility, as well as her daughter did not answer on multiple calls.  Thus we will send it to local [facility] near her facility.  Appears stable for discharge.   Sherwood Gambler, MD 07/27/21 [phone]

## 2021-07-27 NOTE — ED Notes (Signed)
Attempted to call both numbers listed for daughter in demographics tab, straight to voicemail.

## 2021-07-27 NOTE — Progress Notes (Signed)
Orthopedic Tech Progress Note Patient Details:  Kristen Mason 09-07-1934 [id number]  Patient ID: Kristen Mason, female   DOB: 1934/09/22, 87 y.o.   MRN: [id number] I attended trauma page. [name] 07/27/2021, 6:08 AM

## 2021-07-27 NOTE — ED Notes (Signed)
Attempted to call [facility] with no answer.

## 2021-07-27 NOTE — ED Notes (Signed)
Trauma Response Nurse Documentation   Kristen Mason is a 87 y.Kristen Mason. female arriving to [facility] ED via EMS  On Eliquis (apixaban) daily. Trauma was activated as a Level 2 by ED charge RN based on the following trauma criteria Elderly patients > 65 with head trauma on anti-coagulation (excluding ASA). Trauma team at the bedside on patient arrival. Patient cleared for CT by Dr. Matilde Sprang EDP. Patient to CT with TRN. GCS 15.  History   Past Medical History:  Diagnosis Date   Diabetes [facility])    Essential tremor    Fall 06/07/2017   rib fractures   GERD (gastroesophageal reflux disease)    Hypertension    Peripheral neuropathy      Past Surgical History:  Procedure Laterality Date   ABDOMINAL HYSTERECTOMY  1975   BREAST LUMPECTOMY Right    non cancerous   FEMUR IM NAIL Right 11/01/2018   Procedure: INTRAMEDULLARY (IM) RETROGRADE FEMORAL NAILING;  Surgeon: Altamese [place], MD;  Location: [place];  Service: Orthopedics;  Laterality: Right;       Initial Focused Assessment (If applicable, or please see trauma documentation): Alert female presents via EMS from SNF for an unwitnessed fall, states she was trying to get up to smoke Airway patent/BS clear No obvious uncontrolled hemorrhage GCS 15 PERRLA 73m  CT's Completed:   CT Head and CT C-Spine CT chest without contrast  Interventions:  [place] J collar placed upon arrival IV start and trauma lab draw CT XRAY Attempted to contact relatives for patient (daughter and grandson) but could not reach anyone  Plan for disposition:  Pending imaging at this time  Consults completed:  none at the time of this note.  Event Summary: Patient arrives from SNF after an unwitnessed fall. On eliquis. Hematoma to forehead. Abrasions to left forearm. Chronic wounds to RLE and left hand. C/Kristen Mason left rib pain and left humerus pain.   MTP Summary (If applicable): NA  Bedside handoff with ED RN JJanett Billow    Kristen Mason Kristen Mason Kristen Mason  Trauma Response RN  Please  call TRN at [phone] further assistance.

## 2021-07-27 NOTE — ED Provider Notes (Signed)
[facility] EMERGENCY DEPARTMENT Provider Note  CSN: [id number] Arrival date & time: 07/27/21 0402  Chief Complaint(s) Fall  HPI Kristen Mason is a 87 y.o. female with PMH T2DM, previous rib fractures, HTN who presents emergency department as a level 2 trauma for evaluation for a fall on blood thinners.  Fall was unwitnessed and facility found patient on the ground with a forehead hematoma.  Patient does not remove the fall.  Patient arrives with complaints of left-sided chest wall pain, headache, left arm pain.  Denies abdominal pain, nausea, vomiting, fever or other systemic symptoms.   Past Medical History Past Medical History:  Diagnosis Date   Diabetes [facility])    Essential tremor    Fall 06/07/2017   rib fractures   GERD (gastroesophageal reflux disease)    Hypertension    Peripheral neuropathy    Patient Active Problem List   Diagnosis Date Noted   Pressure injury of skin 02/09/2019   Acute lower UTI 02/07/2019   COVID-19 virus infection 02/06/2019   COVID-19 02/05/2019   Hypoxia    Abdominal pain    Femur fracture, right ([place]) 10/31/2018   Acute on chronic kidney failure ([place]) 10/31/2018   Macrocytic anemia 10/31/2018   Falls 07/24/2017   COPD exacerbation ([place]) 06/16/2017   Parkinson's disease ([place]) 06/16/2017   Essential tremor 06/16/2017   Peripheral neuropathy 06/16/2017   Depression 06/16/2017   Anxiety 06/16/2017   Acute respiratory failure with hypoxia ([place]) 06/07/2017   Fall at home, initial encounter 06/07/2017   Multiple rib fractures 06/07/2017   Controlled diabetes mellitus ([place]) 06/07/2017   Essential hypertension 06/07/2017   Tobacco dependence 06/07/2017   Home Medication(s) Prior to Admission medications   Medication Sig Start Date End Date Taking? Authorizing Provider  acetaminophen (TYLENOL) 325 MG tablet Take 2 tablets (650 mg total) by mouth every 6 (six) hours as needed for mild pain (or Fever >/= 101). 11/09/18   Ainsley Spinner, PA-C  acetaminophen (TYLENOL) 325 MG tablet Take 650 mg by mouth every 6 (six) hours as needed for mild pain or fever.    [provider]  amLODipine (NORVASC) 5 MG tablet Take 5 mg by mouth daily.    [provider]  amLODipine (NORVASC) 5 MG tablet Take 5 mg by mouth daily. 05/27/20   [provider]  apixaban (ELIQUIS) 5 MG TABS tablet Take 1 tablet (5 mg total) by mouth 2 (two) times daily. 11/10/18 02/05/19  Marcell Anger, MD  aspirin EC 81 MG tablet Take 81 mg by mouth daily.    [provider]  aspirin EC 81 MG tablet Take 81 mg by mouth daily. Swallow whole.    [provider]  bisacodyl (DULCOLAX) 10 MG suppository Place 1 suppository (10 mg total) rectally daily as needed for moderate constipation. 11/10/18   Spongberg, Audie Pinto, MD  busPIRone (BUSPAR) 5 MG tablet Take 5 mg by mouth 3 (three) times daily. 05/25/20   [provider]  calcium citrate (CALCITRATE - DOSED IN MG ELEMENTAL CALCIUM) 950 MG tablet Take 1 tablet (200 mg of elemental calcium total) by mouth 2 (two) times daily. 11/04/18   Ainsley Spinner, PA-C  CALCIUM CITRATE PO Take 1,000 mg by mouth in the morning and at bedtime.    [provider]  carbidopa-levodopa (SINEMET IR) 25-100 MG tablet Take 1 tablet by mouth 3 (three) times daily.    [provider]  carbidopa-levodopa (SINEMET IR) 25-100 MG tablet Take 1 tablet by  mouth 3 (three) times daily. 05/14/20   [provider]  carboxymethylcellulose (REFRESH TEARS) 0.5 % SOLN Place 1 drop into both eyes 4 (four) times daily.    [provider]  Carboxymethylcellulose Sodium (REFRESH TEARS OP) Place 1 drop into both eyes in the morning, at noon, in the evening, and at bedtime.    [provider]  cephALEXin (KEFLEX) 500 MG capsule Take 500 mg three times a day for 5-7 days 02/04/21   Scarlett Presto, MD  cetirizine (ZYRTEC) 10 MG tablet Take 10 mg by mouth daily.     [provider]  cholecalciferol (VITAMIN D) 25 MCG tablet Take 2 tablets (2,000 Units total) by mouth 2 (two) times daily. 11/04/18   Ainsley Spinner, PA-C  Cholecalciferol (VITAMIN D-3) 125 MCG (5000 UT) TABS Take 1 tablet by mouth daily.    [provider]  citalopram (CELEXA) 40 MG tablet Take 40 mg by mouth daily.    [provider]  citalopram (CELEXA) 40 MG tablet Take 40 mg by mouth daily. 05/17/20   [provider]  clonazePAM (KLONOPIN) 1 MG tablet Take 1 tablet (1 mg total) by mouth at bedtime. 06/11/17   Ghimire, Henreitta Leber, MD  clonazePAM (KLONOPIN) 1 MG tablet Take 1 mg by mouth at bedtime.    [provider]  CRANBERRY PO Take 1 tablet by mouth 2 (two) times daily.    [provider]  CRANBERRY PO Take 2 tablets by mouth in the morning and at bedtime.    [provider]  diclofenac Sodium (VOLTAREN) 1 % GEL Apply 4 g topically in the morning, at noon, and at bedtime. Both knees 01/03/20   [provider]  dicyclomine (BENTYL) 10 MG capsule Take 10 mg by mouth 4 (four) times daily - after meals and at bedtime.    [provider]  dicyclomine (BENTYL) 10 MG capsule Take 10 mg by mouth 4 (four) times daily -  before meals and at bedtime. 05/23/20   [provider]  diphenhydrAMINE (SOMINEX) 25 MG tablet Take 25 mg by mouth 2 (two) times daily as needed for itching. Patient not taking: Reported on 06/06/2020    [provider]  docusate sodium (COLACE) 100 MG capsule Take 1 capsule (100 mg total) by mouth 2 (two) times daily. 11/10/18   Spongberg, Audie Pinto, MD  docusate sodium (COLACE) 100 MG capsule Take 100 mg by mouth 2 (two) times daily.    [provider]  ELIQUIS 5 MG TABS tablet Take 5 mg by mouth 2 (two) times daily. 05/30/20   [provider]  ferrous sulfate 325 (65 FE) MG tablet Take 1 tablet (325 mg total) by mouth daily with breakfast. 11/11/18   Spongberg, Audie Pinto, MD  fluticasone (FLONASE) 50 MCG/ACT nasal spray Place 1 spray into both nostrils daily. 04/30/20   [provider]  gabapentin (NEURONTIN) 800 MG tablet Take 800 mg by mouth 3 (three) times daily.    [provider]  gabapentin (NEURONTIN) 800 MG tablet Take 800 mg by mouth 3 (three) times daily. 05/11/20   [provider]  glimepiride (AMARYL) 1 MG tablet Take 1 mg by mouth daily. 06/04/18   [provider]  glipiZIDE (GLUCOTROL) 5 MG tablet Take 5 mg by mouth daily. 05/23/20   [provider]  guaifenesin (HUMIBID E) 400 MG TABS tablet Take 400 mg by mouth in the morning and at bedtime.    [provider]  HYDROcodone-acetaminophen (  NORCO/VICODIN) 5-325 MG tablet Take 1 tablet by mouth every 8 (eight) hours as needed for moderate pain. 04/24/20   [provider]  hydrOXYzine (ATARAX/VISTARIL) 25 MG tablet Take 25 mg by mouth at bedtime.    [provider]  hydrOXYzine (ATARAX/VISTARIL) 25 MG tablet Take 25 mg by mouth every 8 (eight) hours as needed for itching. 06/02/20   [provider]  lidocaine (LIDODERM) 5 % Place 1 patch onto the skin See admin instructions. Apply to lower back in the morning and remove at bedtime.    [provider]  Lidocaine-Glycerin (PREPARATION H EX) Place 1 application rectally every 6 (six) hours as needed (inflammation).    [provider]  NYSTATIN powder Apply 1 application topically as needed (yeast). 05/09/20   [provider]  Omega-3 Fatty Acids (FISH OIL) 1000 MG CPDR Take 1,000 mg by mouth daily.    [provider]  ondansetron (ZOFRAN-ODT) 4 MG disintegrating tablet Take 1 tablet (4 mg total) by mouth every 8 (eight) hours as needed for nausea or vomiting. 11/10/18   Spongberg, Audie Pinto, MD  pantoprazole (PROTONIX) 40 MG tablet Take 40 mg by mouth daily.    [provider]  pantoprazole (PROTONIX) 40 MG tablet Take 40 mg by mouth  daily. 05/23/20   [provider]  polyethylene glycol powder (GLYCOLAX/MIRALAX) 17 GM/SCOOP powder Take 17 g by mouth daily.    [provider]  primidone (MYSOLINE) 250 MG tablet Take 250 mg by mouth 2 (two) times daily.    [provider]  primidone (MYSOLINE) 250 MG tablet Take 250 mg by mouth 2 (two) times daily. 05/23/20   [provider]  rosuvastatin (CRESTOR) 5 MG tablet Take 5 mg by mouth every evening. 01/26/19   [provider]  rosuvastatin (CRESTOR) 5 MG tablet Take 5 mg by mouth daily. 05/14/20   [provider]  Skin Protectants, Misc. (EUCERIN) cream Apply 1 application topically in the morning and at bedtime.    [provider]  traMADol (ULTRAM) 50 MG tablet Take 50 mg by mouth every 6 (six) hours as needed for moderate pain.    [provider]  vitamin C (VITAMIN C) 500 MG tablet Take 1 tablet (500 mg total) by mouth daily. Patient taking differently: Take 500 mg by mouth 2 (two) times daily.  11/05/18   Ainsley Spinner, PA-C  Vitamin D, Ergocalciferol, (DRISDOL) 1.25 MG (50000 UNIT) CAPS capsule Take 50,000 Units by mouth every Friday. 03/08/20   [provider]  Vitamin D, Ergocalciferol, (DRISDOL) 1.25 MG (50000 UT) CAPS capsule Take 1 capsule (50,000 Units total) by mouth every 7 (seven) days. 11/11/18   Ainsley Spinner, PA-C  Zinc Sulfate 220 (50 Zn) MG TABS Take 1 tablet by mouth daily.    [provider]  Past Surgical History Past Surgical History:  Procedure Laterality Date   ABDOMINAL HYSTERECTOMY  1975   BREAST LUMPECTOMY Right    non cancerous   FEMUR IM NAIL Right 11/01/2018   Procedure: INTRAMEDULLARY (IM) RETROGRADE FEMORAL NAILING;  Surgeon: Altamese [place], MD;  Location: [facility];  Service: Orthopedics;  Laterality: Right;   Family History No family  history on file.  Social History Social History   Tobacco Use   Smoking status: Former    Types: Cigarettes   Smokeless tobacco: Former  Scientific laboratory technician Use: Never used  Substance Use Topics   Alcohol use: No    Alcohol/week: 0.0 standard drinks of alcohol   Drug use: No   Allergies Codeine, Statins, and Statins  Review of Systems Review of Systems  Musculoskeletal:  Positive for arthralgias and myalgias.  Neurological:  Positive for headaches.    Physical Exam Vital Signs  I have reviewed the triage vital signs BP 132/70 Comment: manual  Pulse (!) 56   Temp (!) 97.2 F (36.2 C)   Resp 20   Ht '5\' 5"'$  (1.651 m)   Wt 54 kg   SpO2 94%   BMI 19.81 kg/m   Physical Exam Vitals and nursing note reviewed.  Constitutional:      General: She is not in acute distress.    Appearance: She is well-developed.  HENT:     Head: Normocephalic.     Comments: Forehead hematoma Eyes:     Conjunctiva/sclera: Conjunctivae normal.  Cardiovascular:     Rate and Rhythm: Normal rate and regular rhythm.     Heart sounds: No murmur heard. Pulmonary:     Effort: Pulmonary effort is normal. No respiratory distress.     Breath sounds: Normal breath sounds.  Abdominal:     Palpations: Abdomen is soft.     Tenderness: There is no abdominal tenderness.  Musculoskeletal:        General: Swelling and tenderness present.     Cervical back: Neck supple.  Skin:    General: Skin is warm and dry.     Capillary Refill: Capillary refill takes less than 2 seconds.  Neurological:     Mental Status: She is alert.  Psychiatric:        Mood and Affect: Mood normal.     ED Results and Treatments Labs (all labs ordered are listed, but only abnormal results are displayed) Labs Reviewed  COMPREHENSIVE METABOLIC PANEL - Abnormal; Notable for the following components:      Result Value   Glucose, Bld 134 (*)    Creatinine, Ser 1.20 (*)    Calcium 8.5 (*)    Albumin 3.4 (*)    GFR,  Estimated 45 (*)    All other components within normal limits  CBC WITH DIFFERENTIAL/PLATELET - Abnormal; Notable for the following components:   RBC 3.31 (*)    Hemoglobin 10.6 (*)    HCT 34.8 (*)    MCV 105.1 (*)    Platelets 146 (*)    All other components within normal limits  I-STAT CHEM 8, ED - Abnormal; Notable for the following components:   BUN 24 (*)    Creatinine, Ser 1.20 (*)    Glucose, Bld 130 (*)    Calcium, Ion 1.07 (*)    Hemoglobin 11.2 (*)    HCT 33.0 (*)    All other components within normal limits  URINALYSIS, ROUTINE W REFLEX MICROSCOPIC  Radiology CT Cervical Spine Wo Contrast  Result Date: 07/27/2021 CLINICAL DATA:  87 year old female status post unwitnessed fall. Left lower rib pain. Forehead hematoma. On blood thinners. EXAM: CT CERVICAL SPINE WITHOUT CONTRAST TECHNIQUE: Multidetector CT imaging of the cervical spine was performed without intravenous contrast. Multiplanar CT image reconstructions were also generated. RADIATION DOSE REDUCTION: This exam was performed according to the departmental dose-optimization program which includes automated exposure control, adjustment of the mA and/or kV according to patient size and/or use of iterative reconstruction technique. COMPARISON:  CT cervical spine 06/06/2020. FINDINGS: Alignment: Chronic straightening of cervical lordosis. Cervicothoracic junction alignment is within normal limits. Bilateral posterior element alignment is within normal limits. Skull base and vertebrae: Visualized skull base is intact. No atlanto-occipital dissociation. C1 and C2 appear stable since last year, intact and aligned. No acute osseous abnormality identified. Soft tissues and spinal canal: No prevertebral fluid or swelling. No visible canal hematoma. Chronic heterogeneously enlarged thyroid In the setting of significant  comorbidities or limited life expectancy, no follow-up recommended (ref: J Am Coll Radiol. 2015 Feb;12(2): 143-50).Chronic regional mass effect including leftward deviation of the trachea appears stable. Disc levels: Chronic widespread cervical spine degeneration. Severe chronic anterior C1-odontoid degeneration. Some chronic Ossification of the posterior longitudinal ligament (OPLL). At C2-C3. Severe lower cervical disc space loss and endplate spurring at S0-Y3 and C6-C7. But mild if any associated cervical spinal stenosis appears stable from last year. Upper chest: Chest CT today is reported separately. IMPRESSION: 1. No acute traumatic injury identified in the cervical spine. 2. Chronic cervical spine degeneration appears stable from last year. Electronically Signed   By: Genevie Ann M.D.   On: 07/27/2021 05:21   CT HEAD WO CONTRAST (5MM)  Result Date: 07/27/2021 CLINICAL DATA:  87 year old female status post unwitnessed fall. Left lower rib pain. Forehead hematoma. On blood thinners. EXAM: CT HEAD WITHOUT CONTRAST TECHNIQUE: Contiguous axial images were obtained from the base of the skull through the vertex without intravenous contrast. RADIATION DOSE REDUCTION: This exam was performed according to the departmental dose-optimization program which includes automated exposure control, adjustment of the mA and/or kV according to patient size and/or use of iterative reconstruction technique. COMPARISON:  Head CT 02/04/2021. FINDINGS: Brain: Cerebral volume is stable and within normal limits for age. Patchy and confluent bilateral cerebral white matter hypodensity has not significantly changed since January. No midline shift, ventriculomegaly, mass effect, evidence of mass lesion, intracranial hemorrhage or evidence of cortically based acute infarction. Vascular: Calcified atherosclerosis at the skull base. No suspicious intracranial vascular hyperdensity. Skull: No skull fracture identified. Sinuses/Orbits: Stable  paranasal sinus aeration since January. Tympanic cavities and mastoids appear clear. Other: Left supraorbital forehead, scalp vertex hematoma on series 4, image 72. Underlying calvarium appears stable and intact. No scalp soft tissue gas. Orbits soft tissues appears stable and negative. IMPRESSION: 1. Left scalp hematoma without underlying skull fracture. 2. No acute intracranial abnormality. Stable non contrast CT appearance of the brain since January. Electronically Signed   By: Genevie Ann M.D.   On: 07/27/2021 05:17    Pertinent labs & imaging results that were available during my care of the patient were reviewed by me and considered in my medical decision making (see MDM for details).  Medications Ordered in ED Medications - No data to display  Procedures .Critical Care  Performed by: Teressa Lower, MD Authorized by: Teressa Lower, MD   Critical care provider statement:    Critical care time (minutes):  30   Critical care was necessary to treat or prevent imminent or life-threatening deterioration of the following conditions:  Trauma   Critical care was time spent personally by me on the following activities:  Development of treatment plan with patient or surrogate, discussions with consultants, evaluation of patient's response to treatment, examination of patient, ordering and review of laboratory studies, ordering and review of radiographic studies, ordering and performing treatments and interventions, pulse oximetry, re-evaluation of patient's condition and review of old charts   (including critical care time)  Medical Decision Making / ED Course   This patient presents to the ED for concern of fall, this involves an extensive number of treatment options, and is a complaint that carries with it a high risk of complications and morbidity.  The differential  diagnosis includes fracture, ligamentous injury, contusion, electrolyte abnormality, UTI, pneumonia  MDM: Patient seen emergency room for evaluation of a fall.  Physical exam with tenderness over the left chest wall, left humerus, left hip.  Primary survey unremarkable.  Trauma imaging largely unremarkable outside of a forehead hematoma, subtle nondisplaced left anterior sixth and seventh rib fractures and evidence of aspiration in the left upper lobe.  Imaging also showing a slightly enlarging upper pole right renal cell carcinoma.  Patient started on Augmentin and given pain control for the rib fractures.  Patient pending urinalysis at time of signout.  Patient disposition pending this finding, but anticipate discharge back to facility.   Additional history obtained:  -External records from outside source obtained and reviewed including: Chart review including previous notes, labs, imaging, consultation notes   Lab Tests: -I ordered, reviewed, and interpreted labs.   The pertinent results include:   Labs Reviewed  COMPREHENSIVE METABOLIC PANEL - Abnormal; Notable for the following components:      Result Value   Glucose, Bld 134 (*)    Creatinine, Ser 1.20 (*)    Calcium 8.5 (*)    Albumin 3.4 (*)    GFR, Estimated 45 (*)    All other components within normal limits  CBC WITH DIFFERENTIAL/PLATELET - Abnormal; Notable for the following components:   RBC 3.31 (*)    Hemoglobin 10.6 (*)    HCT 34.8 (*)    MCV 105.1 (*)    Platelets 146 (*)    All other components within normal limits  I-STAT CHEM 8, ED - Abnormal; Notable for the following components:   BUN 24 (*)    Creatinine, Ser 1.20 (*)    Glucose, Bld 130 (*)    Calcium, Ion 1.07 (*)    Hemoglobin 11.2 (*)    HCT 33.0 (*)    All other components within normal limits  URINALYSIS, ROUTINE W REFLEX MICROSCOPIC      EKG   EKG Interpretation  Date/Time:  Sunday July 27 2021 04:11:57 EDT Ventricular Rate:  54 PR  Interval:  237 QRS Duration: 99 QT Interval:  487 QTC Calculation: 462 R Axis:   29 Text Interpretation: Sinus rhythm Prolonged PR interval Low voltage, precordial leads Minimal ST depression, anterior leads nonspecific ST changes similar to Jan 2021 Confirmed by Sherwood Gambler [phone]) on 07/27/2021 7:18:22 AM         Imaging Studies ordered: I ordered imaging studies including CT head, C-spine, CT chest, multiple x-rays I independently visualized and interpreted imaging. I  agree with the radiologist interpretation   Medicines ordered and prescription drug management: No orders of the defined types were placed in this encounter.   -I have reviewed the patients home medicines and have made adjustments as needed  Critical interventions Trauma evaluation, antibiotics, fluids     Cardiac Monitoring: The patient was maintained on a cardiac monitor.  I personally viewed and interpreted the cardiac monitored which showed an underlying rhythm of: NSR  Social Determinants of Health:  Factors impacting patients care include: none   Reevaluation: After the interventions noted above, I reevaluated the patient and found that they have :improved  Co morbidities that complicate the patient evaluation  Past Medical History:  Diagnosis Date   Diabetes [facility])    Essential tremor    Fall 06/07/2017   rib fractures   GERD (gastroesophageal reflux disease)    Hypertension    Peripheral neuropathy       Dispostion: I considered admission for this patient, and disposition pending completion of work-up.  Please see provider signout note for continuation of work-up.     Final Clinical Impression(s) / ED Diagnoses Final diagnoses:  None     '@PCDICTATION'$ @    Teressa Lower, MD 07/27/21 [phone]

## 2021-07-27 NOTE — ED Triage Notes (Signed)
Pt comes via [facility] from [place], unwitnessed fall, hit head, on thinners. Hematoma to forehead.

## 2021-10-31 ENCOUNTER — Emergency Department (HOSPITAL_COMMUNITY): Payer: Medicare (Managed Care)

## 2021-10-31 ENCOUNTER — Inpatient Hospital Stay (HOSPITAL_COMMUNITY)
Admission: EM | Admit: 2021-10-31 | Discharge: 2021-11-03 | DRG: 191 | Disposition: A | Discharge status: Skilled Nursing Facility | Payer: Medicare (Managed Care) | Source: Skilled Nursing Facility | Attending: Internal Medicine | Admitting: Internal Medicine

## 2021-10-31 ENCOUNTER — Other Ambulatory Visit: Payer: Self-pay

## 2021-10-31 ENCOUNTER — Encounter (HOSPITAL_COMMUNITY): Payer: Self-pay

## 2021-10-31 DIAGNOSIS — R0902 Hypoxemia: Secondary | ICD-10-CM | POA: Diagnosis present

## 2021-10-31 DIAGNOSIS — W19XXXA Unspecified fall, initial encounter: Principal | ICD-10-CM

## 2021-10-31 DIAGNOSIS — I480 Paroxysmal atrial fibrillation: Secondary | ICD-10-CM | POA: Diagnosis present

## 2021-10-31 DIAGNOSIS — E875 Hyperkalemia: Secondary | ICD-10-CM | POA: Diagnosis present

## 2021-10-31 DIAGNOSIS — Z794 Long term (current) use of insulin: Secondary | ICD-10-CM

## 2021-10-31 DIAGNOSIS — N1832 Chronic kidney disease, stage 3b: Secondary | ICD-10-CM | POA: Diagnosis present

## 2021-10-31 DIAGNOSIS — E1142 Type 2 diabetes mellitus with diabetic polyneuropathy: Secondary | ICD-10-CM | POA: Diagnosis not present

## 2021-10-31 DIAGNOSIS — Z79899 Other long term (current) drug therapy: Secondary | ICD-10-CM

## 2021-10-31 DIAGNOSIS — K219 Gastro-esophageal reflux disease without esophagitis: Secondary | ICD-10-CM | POA: Diagnosis present

## 2021-10-31 DIAGNOSIS — J9601 Acute respiratory failure with hypoxia: Secondary | ICD-10-CM | POA: Diagnosis present

## 2021-10-31 DIAGNOSIS — Z993 Dependence on wheelchair: Secondary | ICD-10-CM

## 2021-10-31 DIAGNOSIS — G63 Polyneuropathy in diseases classified elsewhere: Secondary | ICD-10-CM

## 2021-10-31 DIAGNOSIS — I959 Hypotension, unspecified: Secondary | ICD-10-CM | POA: Diagnosis present

## 2021-10-31 DIAGNOSIS — N2889 Other specified disorders of kidney and ureter: Secondary | ICD-10-CM | POA: Diagnosis present

## 2021-10-31 DIAGNOSIS — W06XXXA Fall from bed, initial encounter: Secondary | ICD-10-CM | POA: Diagnosis present

## 2021-10-31 DIAGNOSIS — R001 Bradycardia, unspecified: Secondary | ICD-10-CM | POA: Diagnosis present

## 2021-10-31 DIAGNOSIS — Z885 Allergy status to narcotic agent status: Secondary | ICD-10-CM

## 2021-10-31 DIAGNOSIS — M25562 Pain in left knee: Secondary | ICD-10-CM | POA: Diagnosis present

## 2021-10-31 DIAGNOSIS — Z90710 Acquired absence of both cervix and uterus: Secondary | ICD-10-CM

## 2021-10-31 DIAGNOSIS — Z888 Allergy status to other drugs, medicaments and biological substances status: Secondary | ICD-10-CM

## 2021-10-31 DIAGNOSIS — Z87891 Personal history of nicotine dependence: Secondary | ICD-10-CM

## 2021-10-31 DIAGNOSIS — F419 Anxiety disorder, unspecified: Secondary | ICD-10-CM | POA: Diagnosis present

## 2021-10-31 DIAGNOSIS — Z66 Do not resuscitate: Secondary | ICD-10-CM | POA: Diagnosis present

## 2021-10-31 DIAGNOSIS — E1122 Type 2 diabetes mellitus with diabetic chronic kidney disease: Secondary | ICD-10-CM | POA: Diagnosis present

## 2021-10-31 DIAGNOSIS — Z1152 Encounter for screening for COVID-19: Secondary | ICD-10-CM

## 2021-10-31 DIAGNOSIS — F32A Depression, unspecified: Secondary | ICD-10-CM | POA: Diagnosis present

## 2021-10-31 DIAGNOSIS — L8990 Pressure ulcer of unspecified site, unspecified stage: Secondary | ICD-10-CM | POA: Diagnosis present

## 2021-10-31 DIAGNOSIS — G20A1 Parkinson's disease without dyskinesia, without mention of fluctuations: Secondary | ICD-10-CM | POA: Diagnosis present

## 2021-10-31 DIAGNOSIS — R296 Repeated falls: Secondary | ICD-10-CM | POA: Diagnosis present

## 2021-10-31 DIAGNOSIS — M545 Low back pain, unspecified: Secondary | ICD-10-CM | POA: Diagnosis present

## 2021-10-31 DIAGNOSIS — C641 Malignant neoplasm of right kidney, except renal pelvis: Secondary | ICD-10-CM | POA: Diagnosis present

## 2021-10-31 DIAGNOSIS — G8929 Other chronic pain: Secondary | ICD-10-CM | POA: Diagnosis present

## 2021-10-31 DIAGNOSIS — E785 Hyperlipidemia, unspecified: Secondary | ICD-10-CM | POA: Diagnosis present

## 2021-10-31 DIAGNOSIS — W050XXA Fall from non-moving wheelchair, initial encounter: Secondary | ICD-10-CM | POA: Diagnosis not present

## 2021-10-31 DIAGNOSIS — M542 Cervicalgia: Secondary | ICD-10-CM | POA: Diagnosis present

## 2021-10-31 DIAGNOSIS — M25519 Pain in unspecified shoulder: Secondary | ICD-10-CM | POA: Diagnosis present

## 2021-10-31 DIAGNOSIS — Z7982 Long term (current) use of aspirin: Secondary | ICD-10-CM

## 2021-10-31 DIAGNOSIS — E119 Type 2 diabetes mellitus without complications: Secondary | ICD-10-CM

## 2021-10-31 DIAGNOSIS — J441 Chronic obstructive pulmonary disease with (acute) exacerbation: Principal | ICD-10-CM | POA: Diagnosis present

## 2021-10-31 DIAGNOSIS — Z87440 Personal history of urinary (tract) infections: Secondary | ICD-10-CM

## 2021-10-31 DIAGNOSIS — N390 Urinary tract infection, site not specified: Secondary | ICD-10-CM

## 2021-10-31 DIAGNOSIS — I129 Hypertensive chronic kidney disease with stage 1 through stage 4 chronic kidney disease, or unspecified chronic kidney disease: Secondary | ICD-10-CM | POA: Diagnosis present

## 2021-10-31 DIAGNOSIS — L89312 Pressure ulcer of right buttock, stage 2: Secondary | ICD-10-CM | POA: Diagnosis present

## 2021-10-31 DIAGNOSIS — S0003XA Contusion of scalp, initial encounter: Secondary | ICD-10-CM | POA: Diagnosis present

## 2021-10-31 LAB — COMPREHENSIVE METABOLIC PANEL
ALT: 8 U/L (ref 0–44)
AST: 18 U/L (ref 15–41)
Albumin: 3.5 g/dL (ref 3.5–5.0)
Alkaline Phosphatase: 97 U/L (ref 38–126)
Anion gap: 6 (ref 5–15)
BUN: 24 mg/dL — ABNORMAL HIGH (ref 8–23)
CO2: 30 mmol/L (ref 22–32)
Calcium: 8.4 mg/dL — ABNORMAL LOW (ref 8.9–10.3)
Chloride: 101 mmol/L (ref 98–111)
Creatinine, Ser: 1.47 mg/dL — ABNORMAL HIGH (ref 0.44–1.00)
GFR, Estimated: 35 mL/min — ABNORMAL LOW (ref >60)
Glucose, Bld: 94 mg/dL (ref 70–99)
Potassium: 6 mmol/L — ABNORMAL HIGH (ref 3.5–5.1)
Sodium: 137 mmol/L (ref 135–145)
Total Bilirubin: 0.1 mg/dL — ABNORMAL LOW (ref 0.3–1.2)
Total Protein: 6.6 g/dL (ref 6.5–8.1)

## 2021-10-31 LAB — CBC WITH DIFFERENTIAL/PLATELET
Abs Immature Granulocytes: 0.02 10*3/uL (ref 0.00–0.07)
Basophils Absolute: 0 10*3/uL (ref 0.0–0.1)
Basophils Relative: 1 %
Eosinophils Absolute: 0.4 10*3/uL (ref 0.0–0.5)
Eosinophils Relative: 6 %
HCT: 35 % — ABNORMAL LOW (ref 36.0–46.0)
Hemoglobin: 10.8 g/dL — ABNORMAL LOW (ref 12.0–15.0)
Immature Granulocytes: 0 %
Lymphocytes Relative: 18 %
Lymphs Abs: 1 10*3/uL (ref 0.7–4.0)
MCH: 31.2 pg (ref 26.0–34.0)
MCHC: 30.9 g/dL (ref 30.0–36.0)
MCV: 101.2 fL — ABNORMAL HIGH (ref 80.0–100.0)
Monocytes Absolute: 0.5 10*3/uL (ref 0.1–1.0)
Monocytes Relative: 9 %
Neutro Abs: 3.8 10*3/uL (ref 1.7–7.7)
Neutrophils Relative %: 66 %
Platelets: 160 10*3/uL (ref 150–400)
RBC: 3.46 MIL/uL — ABNORMAL LOW (ref 3.87–5.11)
RDW: 15.4 % (ref 11.5–15.5)
WBC: 5.8 10*3/uL (ref 4.0–10.5)
nRBC: 0 % (ref 0.0–0.2)

## 2021-10-31 LAB — POTASSIUM
Potassium: 6 mmol/L — ABNORMAL HIGH (ref 3.5–5.1)
Potassium: 4.9 mmol/L (ref 3.5–5.1)

## 2021-10-31 LAB — HEMOGLOBIN A1C
Hgb A1c MFr Bld: 5.8 % — ABNORMAL HIGH (ref 4.8–5.6)
Mean Plasma Glucose: 119.76 mg/dL

## 2021-10-31 LAB — SARS CORONAVIRUS 2 BY RT PCR: SARS Coronavirus 2 by RT PCR: NEGATIVE

## 2021-10-31 LAB — GLUCOSE, CAPILLARY: Glucose-Capillary: 214 mg/dL — ABNORMAL HIGH (ref 70–99)

## 2021-10-31 MED ORDER — ENOXAPARIN SODIUM 30 MG/0.3ML IJ SOSY: 30.0000 mg | PREFILLED_SYRINGE | INTRAMUSCULAR | Status: DC

## 2021-10-31 MED ORDER — ALBUTEROL SULFATE (2.5 MG/3ML) 0.083% IN NEBU
2.5000 mg | INHALATION_SOLUTION | RESPIRATORY_TRACT | Status: AC
  Administered 2021-10-31: 2.5 mg via RESPIRATORY_TRACT
  Filled 2021-10-31: qty 3

## 2021-10-31 MED ORDER — SODIUM CHLORIDE 0.9 % IV SOLN: INTRAVENOUS | Status: DC

## 2021-10-31 MED ORDER — ACETAMINOPHEN 325 MG PO TABS
650.0000 mg | ORAL_TABLET | Freq: Four times a day (QID) | ORAL | Status: DC | PRN
  Administered 2021-11-01 – 2021-11-02 (×2): 650 mg via ORAL
  Filled 2021-10-31 (×2): qty 2

## 2021-10-31 MED ORDER — ALBUTEROL SULFATE (2.5 MG/3ML) 0.083% IN NEBU
2.5000 mg | INHALATION_SOLUTION | RESPIRATORY_TRACT | Status: DC | PRN
  Administered 2021-11-01: 2.5 mg via RESPIRATORY_TRACT
  Filled 2021-10-31: qty 3

## 2021-10-31 MED ORDER — CALCIUM GLUCONATE-NACL 1-0.675 GM/50ML-% IV SOLN
1.0000 g | Freq: Once | INTRAVENOUS | Status: AC
  Administered 2021-10-31: 1000 mg via INTRAVENOUS
  Filled 2021-10-31: qty 50

## 2021-10-31 MED ORDER — SODIUM CHLORIDE 0.9 % IV BOLUS
1000.0000 mL | Freq: Once | INTRAVENOUS | Status: AC
  Administered 2021-10-31: 1000 mL via INTRAVENOUS

## 2021-10-31 MED ORDER — SODIUM CHLORIDE 0.9 % IV SOLN
1.0000 g | INTRAVENOUS | Status: DC
  Administered 2021-10-31 – 2021-11-01 (×2): 1 g via INTRAVENOUS
  Filled 2021-10-31 (×2): qty 10

## 2021-10-31 MED ORDER — IPRATROPIUM-ALBUTEROL 0.5-2.5 (3) MG/3ML IN SOLN
3.0000 mL | Freq: Four times a day (QID) | RESPIRATORY_TRACT | Status: DC
  Administered 2021-10-31 – 2021-11-01 (×2): 3 mL via RESPIRATORY_TRACT
  Filled 2021-10-31 (×2): qty 3

## 2021-10-31 MED ORDER — PREDNISONE 20 MG PO TABS
40.0000 mg | ORAL_TABLET | Freq: Every day | ORAL | Status: DC
  Administered 2021-11-01: 40 mg via ORAL
  Filled 2021-10-31: qty 2

## 2021-10-31 MED ORDER — SODIUM ZIRCONIUM CYCLOSILICATE 10 G PO PACK
10.0000 g | PACK | ORAL | Status: AC
  Administered 2021-10-31: 10 g via ORAL
  Filled 2021-10-31: qty 1

## 2021-10-31 MED ORDER — ENOXAPARIN SODIUM 30 MG/0.3ML IJ SOSY
30.0000 mg | PREFILLED_SYRINGE | INTRAMUSCULAR | Status: DC
  Administered 2021-10-31: 30 mg via SUBCUTANEOUS
  Filled 2021-10-31: qty 0.3

## 2021-10-31 MED ORDER — ONDANSETRON HCL 4 MG/2ML IJ SOLN: 4.0000 mg | Freq: Four times a day (QID) | INTRAMUSCULAR | Status: DC | PRN

## 2021-10-31 MED ORDER — PRIMIDONE 250 MG PO TABS
250.0000 mg | ORAL_TABLET | Freq: Two times a day (BID) | ORAL | Status: DC
  Administered 2021-11-01 – 2021-11-03 (×6): 250 mg via ORAL
  Filled 2021-10-31 (×9): qty 1

## 2021-10-31 MED ORDER — METHYLPREDNISOLONE SODIUM SUCC 125 MG IJ SOLR
125.0000 mg | INTRAMUSCULAR | Status: AC
  Administered 2021-10-31: 125 mg via INTRAVENOUS
  Filled 2021-10-31: qty 2

## 2021-10-31 MED ORDER — ALBUTEROL SULFATE (2.5 MG/3ML) 0.083% IN NEBU: 2.5000 mg | INHALATION_SOLUTION | Freq: Four times a day (QID) | RESPIRATORY_TRACT | Status: DC

## 2021-10-31 MED ORDER — ROSUVASTATIN CALCIUM 5 MG PO TABS
5.0000 mg | ORAL_TABLET | Freq: Every day | ORAL | Status: DC
  Administered 2021-10-31 – 2021-11-03 (×4): 5 mg via ORAL
  Filled 2021-10-31 (×4): qty 1

## 2021-10-31 MED ORDER — SODIUM CHLORIDE 0.9% FLUSH
3.0000 mL | Freq: Two times a day (BID) | INTRAVENOUS | Status: DC
  Administered 2021-10-31 – 2021-11-03 (×3): 3 mL via INTRAVENOUS

## 2021-10-31 MED ORDER — ONDANSETRON HCL 4 MG PO TABS: 4.0000 mg | ORAL_TABLET | Freq: Four times a day (QID) | ORAL | Status: DC | PRN

## 2021-10-31 MED ORDER — ALBUTEROL SULFATE HFA 108 (90 BASE) MCG/ACT IN AERS
2.0000 | INHALATION_SPRAY | Freq: Once | RESPIRATORY_TRACT | Status: AC
  Administered 2021-10-31: 2 via RESPIRATORY_TRACT
  Filled 2021-10-31: qty 6.7

## 2021-10-31 MED ORDER — DICYCLOMINE HCL 10 MG PO CAPS
10.0000 mg | ORAL_CAPSULE | Freq: Three times a day (TID) | ORAL | Status: DC
  Administered 2021-10-31 – 2021-11-03 (×10): 10 mg via ORAL
  Filled 2021-10-31 (×10): qty 1

## 2021-10-31 MED ORDER — INSULIN ASPART 100 UNIT/ML IJ SOLN
0.0000 [IU] | Freq: Three times a day (TID) | INTRAMUSCULAR | Status: DC
  Administered 2021-11-01 (×2): 2 [IU] via SUBCUTANEOUS
  Administered 2021-11-02: 3 [IU] via SUBCUTANEOUS
  Administered 2021-11-02: 2 [IU] via SUBCUTANEOUS

## 2021-10-31 MED ORDER — GUAIFENESIN ER 600 MG PO TB12
600.0000 mg | ORAL_TABLET | Freq: Two times a day (BID) | ORAL | Status: DC
  Administered 2021-10-31 – 2021-11-03 (×7): 600 mg via ORAL
  Filled 2021-10-31 (×7): qty 1

## 2021-10-31 MED ORDER — CARBIDOPA-LEVODOPA 25-100 MG PO TABS
1.0000 | ORAL_TABLET | Freq: Three times a day (TID) | ORAL | Status: DC
  Administered 2021-10-31 – 2021-11-03 (×8): 1 via ORAL
  Filled 2021-10-31 (×8): qty 1

## 2021-10-31 MED ORDER — HYDROXYZINE HCL 25 MG PO TABS: 25.0000 mg | ORAL_TABLET | Freq: Three times a day (TID) | ORAL | Status: DC | PRN

## 2021-10-31 MED ORDER — SODIUM CHLORIDE 0.9 % IV BOLUS
500.0000 mL | Freq: Once | INTRAVENOUS | Status: AC
  Administered 2021-10-31: 500 mL via INTRAVENOUS

## 2021-10-31 MED ORDER — IPRATROPIUM-ALBUTEROL 0.5-2.5 (3) MG/3ML IN SOLN
3.0000 mL | Freq: Once | RESPIRATORY_TRACT | Status: AC
  Administered 2021-10-31: 3 mL via RESPIRATORY_TRACT
  Filled 2021-10-31: qty 3

## 2021-10-31 MED ORDER — AMLODIPINE BESYLATE 5 MG PO TABS: 5.0000 mg | ORAL_TABLET | Freq: Every day | ORAL | Status: DC

## 2021-10-31 MED ORDER — CLONAZEPAM 0.5 MG PO TABS
1.0000 mg | ORAL_TABLET | Freq: Every day | ORAL | Status: DC
  Administered 2021-10-31 – 2021-11-02 (×3): 1 mg via ORAL
  Filled 2021-10-31 (×3): qty 2

## 2021-10-31 NOTE — ED Notes (Signed)
Patient was placed back into hallway and placed on monitor, after being cleaned and dried.

## 2021-10-31 NOTE — ED Notes (Signed)
Patient transported to X-ray 

## 2021-10-31 NOTE — ED Provider Notes (Signed)
Accepted handoff at shift change from Merrily Pew PA-C. Please see prior provider note for more detail.   Briefly: Patient is 87 y.o.   Pt w hx of COPD and occasional falls out of wheelchair. She presents today after fall at [facility].      Plan:      Physical Exam  BP (!) 106/54   Pulse (!) 56   Temp 98.2 F (36.8 C) (Oral)   Resp 15   Ht '5\' 5"'$  (1.651 m)   Wt 54 kg   SpO2 (!) 89%   BMI 19.81 kg/m   Physical Exam Vitals and nursing note reviewed.  Constitutional:      General: She is not in acute distress.    Appearance: Normal appearance. She is not ill-appearing.  HENT:     Head: Normocephalic and atraumatic.     Mouth/Throat:     Mouth: Mucous membranes are dry.  Eyes:     General: No scleral icterus.       Right eye: No discharge.        Left eye: No discharge.     Conjunctiva/sclera: Conjunctivae normal.  Pulmonary:     Effort: Pulmonary effort is normal.     Breath sounds: No stridor. Wheezing present.     Comments: Expiratory wheezing Neurological:     Mental Status: She is alert and oriented to person, place, and time. Mental status is at baseline.     Procedures  Procedures  ED Course / MDM   Clinical Course as of 10/31/21 1422  Fri Oct 31, 2021  1208 Potassium(!): 6.0 [KL]    Clinical Course User Index [KL] Rex Kras, PA   Medical Decision Making Amount and/or Complexity of Data Reviewed Labs: ordered. Decision-making details documented in ED Course. Radiology: ordered.  Risk OTC drugs. Prescription drug management.   Patient care taken at shift change.  Hyperkalemia of 6.0 noted on CMP.  She has a history of hypokalemia per patient and states that she is on potassium supplements at home.  Will repeat potassium level.  Repeat was again elevated at 6.  CBC with baseline anemia otherwise unremarkable.  CMP with no significant changes apart from very mild increase in creatinine from prior.  Patient reassessed she seems to actually have  worsened from a respiratory standpoint.  She is wheezing significantly.  She will need to be moved to a room instead of the hallway and will need to be kept on cardiac monitor while she is cautiously hydrated and provided Lokelma, DuoNeb which will help with her breathing as well as with her hyperkalemia.  Initial EKG with normal intervals.  Repeat EKG ordered.  I discussed this case with my attending physician who cosigned this note including patient's presenting symptoms, physical exam, and planned diagnostics and interventions. Attending physician stated agreement with plan or made changes to plan which were implemented.    Patient agrees to plan to admit.  2:55 PM BP now 90s/70s - 1L NS.  Will need hospitalist admission.    Pati Gallo [place], [place] 10/31/21 1456    Audley Hose, MD 10/31/21 1525

## 2021-10-31 NOTE — ED Notes (Signed)
Daughter at the bedside

## 2021-10-31 NOTE — Discharge Instructions (Addendum)
Use good lighting in all rooms. Replace any light bulbs that burn out, turn on lights if it is dark, and use night-lights. Place frequently used items in easy-to-reach places. Lower the shelves around your home if necessary. Set up furniture so that there are clear paths around it. Avoid moving your furniture around. Remove throw rugs and other tripping hazards from the floor. Avoid walking on wet floors. Fix any uneven floor surfaces. Add color or contrast paint or tape to grab bars and handrails in your home. Place contrasting color strips on the first and last steps of staircases. When you use a stepladder, make sure that it is completely opened and that the sides and supports are firmly locked. Have someone hold the ladder while you are using it. Do not climb a closed stepladder. Know where your pets are when moving through your home.

## 2021-10-31 NOTE — ED Provider Notes (Signed)
[facility] EMERGENCY DEPARTMENT Provider Note   CSN: [id number] Arrival date & time: 10/31/21  9767     History  Chief Complaint  Patient presents with   Lytle Michaels    Kristen Mason is a 87 y.o. female past medical history of diabetes, hypertension, GERD BIB EMS from [facility] to the emergency department for evaluation of a fall.  Report from nursing, patient had a witnessed fall this morning.  She was trying to get out of bed, slipped and fell on hard floor.  She has a hematoma on on her left forehead, neck pain and shoulder pain and left knee pain.  She has a c-collar by EMS. Is not on any blood thinners.  Denies any chest pain, shortness of breath, nausea, vomiting, bowel changes.    Fall       Home Medications Prior to Admission medications   Medication Sig Start Date End Date Taking? Authorizing Provider  acetaminophen (TYLENOL) 325 MG tablet Take 2 tablets (650 mg total) by mouth every 6 (six) hours as needed for mild pain (or Fever >/= 101). 11/09/18   Ainsley Spinner, PA-C  acetaminophen (TYLENOL) 325 MG tablet Take 650 mg by mouth every 6 (six) hours as needed for mild pain or fever.    [provider]  amLODipine (NORVASC) 5 MG tablet Take 5 mg by mouth daily.    [provider]  amLODipine (NORVASC) 5 MG tablet Take 5 mg by mouth daily. 05/27/20   [provider]  apixaban (ELIQUIS) 5 MG TABS tablet Take 1 tablet (5 mg total) by mouth 2 (two) times daily. 11/10/18 02/05/19  Marcell Anger, MD  aspirin EC 81 MG tablet Take 81 mg by mouth daily.    [provider]  aspirin EC 81 MG tablet Take 81 mg by mouth daily. Swallow whole.    [provider]  bisacodyl (DULCOLAX) 10 MG suppository Place 1 suppository (10 mg total) rectally daily as needed for moderate constipation. 11/10/18   Spongberg, Audie Pinto, MD  busPIRone (BUSPAR) 5 MG tablet Take 5 mg by mouth 3 (three) times daily. 05/25/20   [provider]  calcium citrate (CALCITRATE - DOSED IN MG ELEMENTAL CALCIUM) 950 MG tablet Take 1 tablet (200 mg of elemental calcium total) by mouth 2 (two) times daily. 11/04/18   Ainsley Spinner, PA-C  CALCIUM CITRATE PO Take 1,000 mg by mouth in the morning and at bedtime.    [provider]  carbidopa-levodopa (SINEMET IR) 25-100 MG tablet Take 1 tablet by mouth 3 (three) times daily.    [provider]  carbidopa-levodopa (SINEMET IR) 25-100 MG tablet Take 1 tablet by mouth 3 (three) times daily. 05/14/20   [provider]  carboxymethylcellulose (REFRESH TEARS) 0.5 % SOLN Place 1 drop into both eyes 4 (four) times daily.    [provider]  Carboxymethylcellulose Sodium (REFRESH TEARS OP) Place 1 drop into both eyes in the morning, at noon, in the evening, and at bedtime.    [provider]  cephALEXin (KEFLEX) 500 MG capsule Take 500 mg three times a day for 5-7 days 02/04/21   Scarlett Presto, MD  cetirizine (ZYRTEC) 10 MG tablet Take 10 mg by mouth daily.    [provider]  cholecalciferol (VITAMIN D) 25 MCG tablet Take 2 tablets (2,000 Units total) by mouth 2 (two) times daily. 11/04/18   Ainsley Spinner, PA-C  Cholecalciferol (VITAMIN D-3) 125 MCG (5000 UT) TABS Take 1 tablet by  mouth daily.    [provider]  citalopram (CELEXA) 40 MG tablet Take 40 mg by mouth daily.    [provider]  citalopram (CELEXA) 40 MG tablet Take 40 mg by mouth daily. 05/17/20   [provider]  clonazePAM (KLONOPIN) 1 MG tablet Take 1 tablet (1 mg total) by mouth at bedtime. 06/11/17   Ghimire, Henreitta Leber, MD  clonazePAM (KLONOPIN) 1 MG tablet Take 1 mg by mouth at bedtime.    [provider]  CRANBERRY PO Take 1 tablet by mouth 2 (two) times daily.    [provider]  CRANBERRY PO Take 2 tablets by mouth in the morning and at bedtime.    [provider]  diclofenac Sodium (VOLTAREN) 1 % GEL Apply 4 g topically in  the morning, at noon, and at bedtime. Both knees 01/03/20   [provider]  dicyclomine (BENTYL) 10 MG capsule Take 10 mg by mouth 4 (four) times daily - after meals and at bedtime.    [provider]  dicyclomine (BENTYL) 10 MG capsule Take 10 mg by mouth 4 (four) times daily -  before meals and at bedtime. 05/23/20   [provider]  diphenhydrAMINE (SOMINEX) 25 MG tablet Take 25 mg by mouth 2 (two) times daily as needed for itching. Patient not taking: Reported on 06/06/2020    [provider]  docusate sodium (COLACE) 100 MG capsule Take 1 capsule (100 mg total) by mouth 2 (two) times daily. 11/10/18   Spongberg, Audie Pinto, MD  docusate sodium (COLACE) 100 MG capsule Take 100 mg by mouth 2 (two) times daily.    [provider]  ELIQUIS 5 MG TABS tablet Take 5 mg by mouth 2 (two) times daily. 05/30/20   [provider]  ferrous sulfate 325 (65 FE) MG tablet Take 1 tablet (325 mg total) by mouth daily with breakfast. 11/11/18   Spongberg, Audie Pinto, MD  fluticasone (FLONASE) 50 MCG/ACT nasal spray Place 1 spray into both nostrils daily. 04/30/20   [provider]  gabapentin (NEURONTIN) 800 MG tablet Take 800 mg by mouth 3 (three) times daily.    [provider]  gabapentin (NEURONTIN) 800 MG tablet Take 800 mg by mouth 3 (three) times daily. 05/11/20   [provider]  glimepiride (AMARYL) 1 MG tablet Take 1 mg by mouth daily. 06/04/18   [provider]  glipiZIDE (GLUCOTROL) 5 MG tablet Take 5 mg by mouth daily. 05/23/20   [provider]  guaifenesin (HUMIBID E) 400 MG TABS tablet Take 400 mg by mouth in the morning and at bedtime.    [provider]  HYDROcodone-acetaminophen (NORCO) 5-325 MG tablet Take 1 tablet by mouth every 6 (six) hours as needed. 07/27/21   Sherwood Gambler, MD  hydrOXYzine (ATARAX/VISTARIL) 25 MG tablet Take 25 mg by mouth at bedtime.    [provider]   hydrOXYzine (ATARAX/VISTARIL) 25 MG tablet Take 25 mg by mouth every 8 (eight) hours as needed for itching. 06/02/20   [provider]  lidocaine (LIDODERM) 5 % Place 1 patch onto the skin See admin instructions. Apply to lower back in the morning and remove at bedtime.    [provider]  Lidocaine-Glycerin (PREPARATION H EX) Place 1 application rectally every 6 (six) hours as needed (inflammation).    [provider]  NYSTATIN powder Apply 1 application topically as needed (yeast). 05/09/20   [provider]  Omega-3 Fatty Acids (FISH OIL) 1000  MG CPDR Take 1,000 mg by mouth daily.    [provider]  ondansetron (ZOFRAN-ODT) 4 MG disintegrating tablet Take 1 tablet (4 mg total) by mouth every 8 (eight) hours as needed for nausea or vomiting. 11/10/18   Spongberg, Audie Pinto, MD  pantoprazole (PROTONIX) 40 MG tablet Take 40 mg by mouth daily.    [provider]  pantoprazole (PROTONIX) 40 MG tablet Take 40 mg by mouth daily. 05/23/20   [provider]  polyethylene glycol powder (GLYCOLAX/MIRALAX) 17 GM/SCOOP powder Take 17 g by mouth daily.    [provider]  primidone (MYSOLINE) 250 MG tablet Take 250 mg by mouth 2 (two) times daily.    [provider]  primidone (MYSOLINE) 250 MG tablet Take 250 mg by mouth 2 (two) times daily. 05/23/20   [provider]  rosuvastatin (CRESTOR) 5 MG tablet Take 5 mg by mouth every evening. 01/26/19   [provider]  rosuvastatin (CRESTOR) 5 MG tablet Take 5 mg by mouth daily. 05/14/20   [provider]  Skin Protectants, Misc. (EUCERIN) cream Apply 1 application topically in the morning and at bedtime.    [provider]  traMADol (ULTRAM) 50 MG tablet Take 50 mg by mouth every 6 (six) hours as needed for moderate pain.    [provider]  vitamin C (VITAMIN C) 500 MG tablet Take 1 tablet (500 mg total) by mouth daily. Patient taking  differently: Take 500 mg by mouth 2 (two) times daily.  11/05/18   Ainsley Spinner, PA-C  Vitamin D, Ergocalciferol, (DRISDOL) 1.25 MG (50000 UNIT) CAPS capsule Take 50,000 Units by mouth every Friday. 03/08/20   [provider]  Vitamin D, Ergocalciferol, (DRISDOL) 1.25 MG (50000 UT) CAPS capsule Take 1 capsule (50,000 Units total) by mouth every 7 (seven) days. 11/11/18   Ainsley Spinner, PA-C  Zinc Sulfate 220 (50 Zn) MG TABS Take 1 tablet by mouth daily.    [provider]      Allergies    Codeine, Statins, and Statins    Review of Systems   Review of Systems  Musculoskeletal:        Neck pain, shoulder pain, knee pain    Physical Exam Updated Vital Signs BP (!) 139/40 (BP Location: Right Arm)   Pulse (!) 42   Temp 97.6 F (36.4 C) (Oral)   Resp 20   Ht '5\' 5"'$  (1.651 m)   Wt 54 kg   SpO2 93%   BMI 19.81 kg/m  Physical Exam Vitals and nursing note reviewed.  Constitutional:      Appearance: Normal appearance.  HENT:     Head: Normocephalic and atraumatic.     Mouth/Throat:     Mouth: Mucous membranes are moist.  Eyes:     General: No scleral icterus. Cardiovascular:     Rate and Rhythm: Normal rate and regular rhythm.     Pulses: Normal pulses.     Heart sounds: Normal heart sounds.  Pulmonary:     Effort: Pulmonary effort is normal.     Breath sounds: Wheezing and rales present.  Abdominal:     General: Abdomen is flat.     Palpations: Abdomen is soft.     Tenderness: There is no abdominal tenderness.  Musculoskeletal:        General: No deformity.  Skin:    General: Skin is warm.     Findings: No rash.  Neurological:     General: No focal deficit present.  Mental Status: She is alert and oriented to person, place, and time.  Psychiatric:        Mood and Affect: Mood normal.     ED Results / Procedures / Treatments   Labs (all labs ordered are listed, but only abnormal results are displayed) Labs Reviewed  CBC WITH DIFFERENTIAL/PLATELET   COMPREHENSIVE METABOLIC PANEL    EKG None  Radiology CT Head Wo Contrast  Result Date: 10/31/2021 CLINICAL DATA:  [place].  Hit head. EXAM: CT HEAD WITHOUT CONTRAST CT CERVICAL SPINE WITHOUT CONTRAST TECHNIQUE: Multidetector CT imaging of the head and cervical spine was performed following the standard protocol without intravenous contrast. Multiplanar CT image reconstructions of the cervical spine were also generated. RADIATION DOSE REDUCTION: This exam was performed according to the departmental dose-optimization program which includes automated exposure control, adjustment of the mA and/or kV according to patient size and/or use of iterative reconstruction technique. COMPARISON:  Prior study 07/27/2021 FINDINGS: CT HEAD FINDINGS Brain: Stable age related cerebral atrophy, ventriculomegaly and periventricular white matter disease. No extra-axial fluid collections are identified. No CT findings for acute hemispheric infarction or intracranial hemorrhage. No mass lesions. The brainstem and cerebellum are normal. Vascular: Stable vascular calcifications. No aneurysm or hyperdense vessels. Skull: No acute skull fracture or worrisome bone lesion. Sinuses/Orbits: The paranasal sinuses and mastoid air cells are clear. The globes are intact. Other: Small left frontal scalp hematoma without underlying fracture. CT CERVICAL SPINE FINDINGS Alignment: Normal Skull base and vertebrae: No acute fracture. No primary bone lesion or focal pathologic process. Soft tissues and spinal canal: No prevertebral fluid or swelling. No visible canal hematoma. Disc levels: Stable advanced degenerative changes at C1-2. The spinal canal is fairly generous. No large disc protrusions or significant canal stenosis. Stable multilevel foraminal narrowing due to facet disease and uncinate spurring. Upper chest: The lung apices are grossly clear. Other: Stable massive multinodular thyroid goiter unchanged since 2018. IMPRESSION: 1. Stable age  related cerebral atrophy, ventriculomegaly and periventricular white matter disease. 2. Small left frontal scalp hematoma without underlying fracture. 3. No acute intracranial findings. 4. Normal alignment of the cervical spine without acute fracture. 5. Stable massive multinodular thyroid goiter unchanged since 2018. Electronically Signed   By: Marijo Sanes M.D.   On: 10/31/2021 09:02   CT Cervical Spine Wo Contrast  Result Date: 10/31/2021 CLINICAL DATA:  [place].  Hit head. EXAM: CT HEAD WITHOUT CONTRAST CT CERVICAL SPINE WITHOUT CONTRAST TECHNIQUE: Multidetector CT imaging of the head and cervical spine was performed following the standard protocol without intravenous contrast. Multiplanar CT image reconstructions of the cervical spine were also generated. RADIATION DOSE REDUCTION: This exam was performed according to the departmental dose-optimization program which includes automated exposure control, adjustment of the mA and/or kV according to patient size and/or use of iterative reconstruction technique. COMPARISON:  Prior study 07/27/2021 FINDINGS: CT HEAD FINDINGS Brain: Stable age related cerebral atrophy, ventriculomegaly and periventricular white matter disease. No extra-axial fluid collections are identified. No CT findings for acute hemispheric infarction or intracranial hemorrhage. No mass lesions. The brainstem and cerebellum are normal. Vascular: Stable vascular calcifications. No aneurysm or hyperdense vessels. Skull: No acute skull fracture or worrisome bone lesion. Sinuses/Orbits: The paranasal sinuses and mastoid air cells are clear. The globes are intact. Other: Small left frontal scalp hematoma without underlying fracture. CT CERVICAL SPINE FINDINGS Alignment: Normal Skull base and vertebrae: No acute fracture. No primary bone lesion or focal pathologic process. Soft tissues and spinal canal: No prevertebral fluid or swelling.  No visible canal hematoma. Disc levels: Stable advanced  degenerative changes at C1-2. The spinal canal is fairly generous. No large disc protrusions or significant canal stenosis. Stable multilevel foraminal narrowing due to facet disease and uncinate spurring. Upper chest: The lung apices are grossly clear. Other: Stable massive multinodular thyroid goiter unchanged since 2018. IMPRESSION: 1. Stable age related cerebral atrophy, ventriculomegaly and periventricular white matter disease. 2. Small left frontal scalp hematoma without underlying fracture. 3. No acute intracranial findings. 4. Normal alignment of the cervical spine without acute fracture. 5. Stable massive multinodular thyroid goiter unchanged since 2018. Electronically Signed   By: Marijo Sanes M.D.   On: 10/31/2021 09:02   DG Chest 2 View  Result Date: 10/31/2021 CLINICAL DATA:  Fall from seated position, wheezing EXAM: CHEST - 2 VIEW COMPARISON:  Chest CT July 27, 2021, chest x-ray July 27, 2021 FINDINGS: Stable mediastinal contours. Unchanged cardiomegaly. No focal pulmonary opacity. No large pleural effusion or pneumothorax. No acute osseous abnormality. Severe bilateral shoulder osteoarthritis. The visualized upper abdomen is unremarkable. IMPRESSION: No acute cardiopulmonary abnormality. Electronically Signed   By: Beryle Flock M.D.   On: 10/31/2021 08:47   DG Shoulder Right  Result Date: 10/31/2021 CLINICAL DATA:  Fall with right shoulder pain EXAM: RIGHT SHOULDER - 2 VIEW COMPARISON:  None Available. FINDINGS: There is no evidence of fracture or dislocation. Osteopenia with degenerative spurring at the glenohumeral and acromioclavicular joints. Subacromial space narrowing with inferior acromial spurring. Remote lateral right rib fractures. IMPRESSION: Degenerated shoulder without acute finding. Electronically Signed   By: Jorje Guild M.D.   On: 10/31/2021 08:46    Procedures Procedures    Medications Ordered in ED Medications  albuterol (VENTOLIN HFA) 108 (90 Base) MCG/ACT  inhaler 2 puff (has no administration in time range)  acetaminophen (TYLENOL) tablet 650 mg (has no administration in time range)    ED Course/ Medical Decision Making/ A&P Clinical Course as of 10/31/21 1210  Fri Oct 31, 2021  1208 Potassium(!): 6.0 [KL]    Clinical Course User Index [KL] Rex Kras, PA                           Medical Decision Making Amount and/or Complexity of Data Reviewed Labs: ordered. Decision-making details documented in ED Course. Radiology: ordered.  Risk OTC drugs. Prescription drug management. Decision regarding hospitalization.   This patient presents to the ED for concern of fall, this involves an extensive number of treatment options, and is a complaint that carries with it a high risk of complications and morbidity.  The differential diagnosis includes cranial fractures, hematoma, arrhythmia, intracranial hemorrhage.   Co morbidities that complicate the patient evaluation  See HPI   Additional history obtained:  Additional history obtained from EMR External records from outside source obtained and reviewed including Care Everywhere/External Records and Primary Care Documents  Lab Tests:  I Ordered, and personally interpreted labs.  The pertinent results include:   No leukocytosis noted.  Low hemoglobin 10.8 around baseline.  Platelets within normal range.  Hyperkalemia 6.0.  Renal function With creatinine and BUN at baseline..  No transaminitis noted.  Lipase within normal limits.  UA significant for no acute abnormalities.   Imaging Studies ordered:  I ordered imaging studies including CT head, CT cervical spine, chest x-ray, shoulder x-ray I independently visualized and interpreted imaging which showed small left frontal scalp hematoma without underlying fracture, no acute intracranial findings and normal alignment of the  cervical spine without acute fracture. I agree with the radiologist interpretation   Cardiac Monitoring: /  EKG:  EKG shows sinus bradycardia, no significant change since her previous EKG in July.   Consultations Obtained:  NA   Problem List / ED Course / Critical interventions / Medication management  Follow Vitals signs significant for heart rate 42 ,blood pressure 139/40. Otherwise within normal range and stable throughout visit. Laboratory/imaging studies significant for: See above Physical examination, patient is afebrile, ANO x3.  She has hematoma on her left forehead, complains of neck pain, shoulder pain, no chest pain or shortness of breath.  She does have wheezes and rales on examination.  Her EKG showed sinus bradycardia with no significant change since her previous EKG in July.  This is a mechanical fall unlikely related to arrhythmia. 11:40 AM Patient is reassessed.  She still having shoulder pain, back pain.  Will prescribe Tylenol for pain control.  C-collar is cleared as CT cervical spine negative.  I ordered medication including Tylenol for pain Reevaluation of the patient after these medicines showed that the patient improved I have reviewed the patients home medicines and have made adjustments as needed    Social Determinants of Health:  NA   Test / Admission - Considered:  Signed out patient care at shift change to [facility] PA-C        Final Clinical Impression(s) / ED Diagnoses Final diagnoses:  None    Rx / DC Orders ED Discharge Orders     None         Rex Kras, [place] 11/01/21 1722    Audley Hose, MD 11/04/21 2130

## 2021-10-31 NOTE — H&P (Addendum)
History and Physical    Patient: Syenna Nazir ION:[id number] DOB: February 12, 1934 DOA: 10/31/2021 DOS: the patient was seen and examined on 10/31/2021 PCP: Caprice Renshaw, MD  Patient coming from: [facility] via [facility]  Chief Complaint:  Chief Complaint  Patient presents with   Fall   HPI: Mercedies Ganesh is a 87 y.o. female with medical history significant of hypertension, PAF, COPD, diabetes mellitus type 2 with peripheral neuropathy, Parkinson's, and GERD who presents after falling out of her wheelchair.  Patient notes that she was bending over to pick up something at that time when she lost her balance fell out of the wheelchair hitting her head.  Complains of having discomfort in her neck, right shoulder and knee.  Denied any loss of consciousness.   She has been having phlegm drainage from her sinuses present for a couple months and has intermittently been wheezing here recently.  Patient notes that she deals with frequent UTIs and currently is being treated receiving shots daily in her leg.  Notes dysuria is improved some since being on the antibiotics.  Normal she is not on oxygen at baseline. Patient denies having any fevers, nausea, vomiting, abdominal pain, or leg swelling.  She states that at the facility she has not been eating very much because the food is not good.  Also notes history of chronic lower back pain from an injury as a child while rollerskating. Review of records patient's last fall was in July where she suffered left sixth and seventh rib fractures.  Upon admission to the emergency department patient was noted to be afebrile with pulse 42-56, blood pressures 84/52-139/40, and O2 saturations as low as 88-89% with improvement on 2 L of nasal cannula oxygen.  CT scan of the head and cervical spine did not note any acute intercranial abnormality.  Chest x-ray was otherwise clear.  X-ray imaging of the right shoulder noted degenerated shoulder without acute finding.  Labs noted  hemoglobin 10.8, potassium 6, BUN 24, creatinine 1.47, and calcium 8.4.  Patient having given  Solu-Medrol 125 mg IV, 10 g of Lokelma, normal saline 500 mL bolus, and albuterol breathing treatment.  Review of Systems: As mentioned in the history of present illness. All other systems reviewed and are negative. Past Medical History:  Diagnosis Date   Diabetes [facility])    Essential tremor    Fall 06/07/2017   rib fractures   GERD (gastroesophageal reflux disease)    Hypertension    Peripheral neuropathy    Past Surgical History:  Procedure Laterality Date   ABDOMINAL HYSTERECTOMY  1975   BREAST LUMPECTOMY Right    non cancerous   FEMUR IM NAIL Right 11/01/2018   Procedure: INTRAMEDULLARY (IM) RETROGRADE FEMORAL NAILING;  Surgeon: Altamese [place], MD;  Location: [facility];  Service: Orthopedics;  Laterality: Right;   Social History:  reports that she has quit smoking. Her smoking use included cigarettes. She has quit using smokeless tobacco. She reports that she does not drink alcohol and does not use drugs.  Allergies  Allergen Reactions   Codeine Nausea And Vomiting    Vomiting    Statins Other (See Comments)   Statins     No family history on file.  Prior to Admission medications   Medication Sig Start Date End Date Taking? Authorizing Provider  acetaminophen (TYLENOL) 325 MG tablet Take 2 tablets (650 mg total) by mouth every 6 (six) hours as needed for mild pain (or Fever >/= 101). 11/09/18   Ainsley Spinner, PA-C  acetaminophen (TYLENOL) 325 MG tablet Take 650 mg by mouth every 6 (six) hours as needed for mild pain or fever.    [provider]  amLODipine (NORVASC) 5 MG tablet Take 5 mg by mouth daily.    [provider]  amLODipine (NORVASC) 5 MG tablet Take 5 mg by mouth daily. 05/27/20   [provider]  apixaban (ELIQUIS) 5 MG TABS tablet Take 1 tablet (5 mg total) by mouth 2 (two) times daily. 11/10/18 02/05/19  Marcell Anger, MD  aspirin EC 81 MG  tablet Take 81 mg by mouth daily.    [provider]  aspirin EC 81 MG tablet Take 81 mg by mouth daily. Swallow whole.    [provider]  bisacodyl (DULCOLAX) 10 MG suppository Place 1 suppository (10 mg total) rectally daily as needed for moderate constipation. 11/10/18   Spongberg, Audie Pinto, MD  busPIRone (BUSPAR) 5 MG tablet Take 5 mg by mouth 3 (three) times daily. 05/25/20   [provider]  calcium citrate (CALCITRATE - DOSED IN MG ELEMENTAL CALCIUM) 950 MG tablet Take 1 tablet (200 mg of elemental calcium total) by mouth 2 (two) times daily. 11/04/18   Ainsley Spinner, PA-C  CALCIUM CITRATE PO Take 1,000 mg by mouth in the morning and at bedtime.    [provider]  carbidopa-levodopa (SINEMET IR) 25-100 MG tablet Take 1 tablet by mouth 3 (three) times daily.    [provider]  carbidopa-levodopa (SINEMET IR) 25-100 MG tablet Take 1 tablet by mouth 3 (three) times daily. 05/14/20   [provider]  carboxymethylcellulose (REFRESH TEARS) 0.5 % SOLN Place 1 drop into both eyes 4 (four) times daily.    [provider]  Carboxymethylcellulose Sodium (REFRESH TEARS OP) Place 1 drop into both eyes in the morning, at noon, in the evening, and at bedtime.    [provider]  cephALEXin (KEFLEX) 500 MG capsule Take 500 mg three times a day for 5-7 days 02/04/21   Scarlett Presto, MD  cetirizine (ZYRTEC) 10 MG tablet Take 10 mg by mouth daily.    [provider]  cholecalciferol (VITAMIN D) 25 MCG tablet Take 2 tablets (2,000 Units total) by mouth 2 (two) times daily. 11/04/18   Ainsley Spinner, PA-C  Cholecalciferol (VITAMIN D-3) 125 MCG (5000 UT) TABS Take 1 tablet by mouth daily.    [provider]  citalopram (CELEXA) 40 MG tablet Take 40 mg by mouth daily.    [provider]  citalopram (CELEXA) 40 MG tablet Take 40 mg by mouth daily. 05/17/20   [provider]  clonazePAM (KLONOPIN) 1 MG tablet Take  1 tablet (1 mg total) by mouth at bedtime. 06/11/17   Ghimire, Henreitta Leber, MD  clonazePAM (KLONOPIN) 1 MG tablet Take 1 mg by mouth at bedtime.    [provider]  CRANBERRY PO Take 1 tablet by mouth 2 (two) times daily.    [provider]  CRANBERRY PO Take 2 tablets by mouth in the morning and at bedtime.    [provider]  diclofenac Sodium (VOLTAREN) 1 % GEL Apply 4 g topically in the morning, at noon, and at bedtime. Both knees 01/03/20   [provider]  dicyclomine (BENTYL) 10 MG capsule Take 10 mg by mouth 4 (four) times daily - after meals and at bedtime.    [provider]  dicyclomine (BENTYL) 10 MG capsule Take 10 mg by mouth 4 (four) times daily -  before meals and at bedtime. 05/23/20   [provider]  diphenhydrAMINE (SOMINEX) 25 MG tablet Take 25 mg by mouth 2 (two) times daily as needed for itching. Patient not taking: Reported on 06/06/2020    [provider]  docusate sodium (COLACE) 100 MG capsule Take 1 capsule (100 mg total) by mouth 2 (two) times daily. 11/10/18   Spongberg, Audie Pinto, MD  docusate sodium (COLACE) 100 MG capsule Take 100 mg by mouth 2 (two) times daily.    [provider]  ELIQUIS 5 MG TABS tablet Take 5 mg by mouth 2 (two) times daily. 05/30/20   [provider]  ferrous sulfate 325 (65 FE) MG tablet Take 1 tablet (325 mg total) by mouth daily with breakfast. 11/11/18   Spongberg, Audie Pinto, MD  fluticasone (FLONASE) 50 MCG/ACT nasal spray Place 1 spray into both nostrils daily. 04/30/20   [provider]  gabapentin (NEURONTIN) 800 MG tablet Take 800 mg by mouth 3 (three) times daily.    [provider]  gabapentin (NEURONTIN) 800 MG tablet Take 800 mg by mouth 3 (three) times daily. 05/11/20   [provider]  glimepiride (AMARYL) 1 MG tablet Take 1 mg by mouth daily. 06/04/18   [provider]  glipiZIDE (GLUCOTROL) 5 MG tablet Take 5 mg by  mouth daily. 05/23/20   [provider]  guaifenesin (HUMIBID E) 400 MG TABS tablet Take 400 mg by mouth in the morning and at bedtime.    [provider]  HYDROcodone-acetaminophen (NORCO) 5-325 MG tablet Take 1 tablet by mouth every 6 (six) hours as needed. 07/27/21   Sherwood Gambler, MD  hydrOXYzine (ATARAX/VISTARIL) 25 MG tablet Take 25 mg by mouth at bedtime.    [provider]  hydrOXYzine (ATARAX/VISTARIL) 25 MG tablet Take 25 mg by mouth every 8 (eight) hours as needed for itching. 06/02/20   [provider]  lidocaine (LIDODERM) 5 % Place 1 patch onto the skin See admin instructions. Apply to lower back in the morning and remove at bedtime.    [provider]  Lidocaine-Glycerin (PREPARATION H EX) Place 1 application rectally every 6 (six) hours as needed (inflammation).    [provider]  NYSTATIN powder Apply 1 application topically as needed (yeast). 05/09/20   [provider]  Omega-3 Fatty Acids (FISH OIL) 1000 MG CPDR Take 1,000 mg by mouth daily.    [provider]  ondansetron (ZOFRAN-ODT) 4 MG disintegrating tablet Take 1 tablet (4 mg total) by mouth every 8 (eight) hours as needed for nausea or vomiting. 11/10/18   Spongberg, Audie Pinto, MD  pantoprazole (PROTONIX) 40 MG tablet Take 40 mg by mouth daily.    [provider]  pantoprazole (PROTONIX) 40 MG tablet Take 40 mg by mouth daily. 05/23/20   [provider]  polyethylene glycol powder (GLYCOLAX/MIRALAX) 17 GM/SCOOP powder Take 17 g by mouth daily.    [provider]  primidone (MYSOLINE) 250 MG tablet Take 250 mg by mouth 2 (two) times daily.    [provider]  primidone (MYSOLINE) 250 MG tablet Take 250 mg by mouth 2 (two) times daily. 05/23/20   [provider]  rosuvastatin (CRESTOR) 5 MG tablet Take 5 mg by mouth every evening. 01/26/19   [provider]  rosuvastatin (CRESTOR) 5 MG tablet Take 5 mg  by mouth daily. 05/14/20   [provider]  Skin Protectants, Misc. (EUCERIN) cream Apply 1 application topically in the morning and  at bedtime.    [provider]  traMADol (ULTRAM) 50 MG tablet Take 50 mg by mouth every 6 (six) hours as needed for moderate pain.    [provider]  vitamin C (VITAMIN C) 500 MG tablet Take 1 tablet (500 mg total) by mouth daily. Patient taking differently: Take 500 mg by mouth 2 (two) times daily.  11/05/18   Ainsley Spinner, PA-C  Vitamin D, Ergocalciferol, (DRISDOL) 1.25 MG (50000 UNIT) CAPS capsule Take 50,000 Units by mouth every Friday. 03/08/20   [provider]  Vitamin D, Ergocalciferol, (DRISDOL) 1.25 MG (50000 UT) CAPS capsule Take 1 capsule (50,000 Units total) by mouth every 7 (seven) days. 11/11/18   Ainsley Spinner, PA-C  Zinc Sulfate 220 (50 Zn) MG TABS Take 1 tablet by mouth daily.    [provider]    Physical Exam: Vitals:   10/31/21 0744 10/31/21 1237 10/31/21 1239 10/31/21 1415  BP: (!) 139/40 (!) 127/100  (!) 106/54  Pulse: (!) 42 (!) 54  (!) 56  Resp: '20 16  15  '$ Temp: 97.6 F (36.4 C)  98.2 F (36.8 C)   TempSrc: Oral  Oral   SpO2: 93% 94%  (!) 89%  Weight:      Height:       Constitutional: Elderly female who appears to be in acute distress Eyes: PERRL, lids and conjunctivae normal ENMT: Mucous membranes are moist.  Poor dentition with multiple missing teeth Neck: normal, supple  Respiratory: Decreased overall aeration with expiratory wheezes and rales appreciated. Cardiovascular: Bradycardic.  No extremity edema. 2+ pedal pulses. Abdomen: no tenderness, no masses palpated.   Bowel sounds positive.  Musculoskeletal: no clubbing / cyanosis. No joint deformity upper and lower extremities. Skin: Poor skin turgor. Neurologic: CN 2-12 grossly intact.  Tremor present. Psychiatric: Normal judgment and insight. Alert and oriented x 3. Normal mood.   Data Reviewed:  EKG reveals sinus bradycardia  47 bpm with sinus arrhythmia  Assessment and Plan: Acute respiratory failure with hypoxia secondary to suspected COPD exacerbation Acute.  Patient noted to have wheezing on physical exam and reported having sinus drainage.  O2 saturations dropped as low as 88-89% for which patient was started on 2 L of nasal cannula oxygen with improvement to 97%.  Chest x-ray showed no acute abnormality.  Patient has been given 125 mg of Solu-Medrol IV albuterol breathing treatment. -Admit to a telemetry bed -Continuous pulse oximetry with oxygen maintain O2 saturation greater than 92% -Wean to room air as tolerated -Check influenza and COVID-19 screening -DuoNebs   -Prednisone -Rocephin IV. -Mucinex  Hyperkalemia Acute.  Potassium was noted to be 6 and confirmed on recheck.  Patient has been given IV fluids, albuterol neb, and Lokelma. -Giving calcium gluconate IV -Hold any potassium supplementation -Recheck potassium level at 6pm  Transient hypotension Blood pressures transiently as low as 84/52.  Patient had been bolused a total of 1.5 L of IV fluids with improvement. -Goal MAP greater than 65 -Hold home blood pressure medications  Sinus bradycardia  Chronic.  Heart rates 42-58, but appears to have been noted during prior hospitalizations. -Hold rate controlling medications at this time due to hypotension -Correct electrolyte abnormalities  Fall from wheelchair  Patient reported bending over to pick up something as the cause for falling from her wheelchair.  Other reports noted that she fell out of bed. -Fall precautions -PT consulted to evaluate and treat  Hypocalcemia Acute.  Calcium 8.4 on admission with the limits. -Give 1 g  of calcium gluconate IV -Continue to monitor and replace as needed  Renal insufficiency chronic kidney disease stage IIIb Creatinine 1.47 with BUN 24.  Baseline creatinine previously been around 1.2.  Patient was thought to be dehydrated and given IV  fluids. -Continue normal saline IV fluids at 50 mL/h overnight -Follow-up repeat kidney function  Frequent UTIs Patient reports that she currently was receiving treatment with shots into her legs for UTI.  Complains of continued dysuria although ported some improvement.  Review of the med log shows patient had been prescribed cephalexin several times over the last couple of months. -Check urinalysis and urine culture  Parkinson's disease Patient has resting tremor. -Delirium precautions -Continue Sinemet 25-109 mg 3 times daily and primidone 250 mg twice daily  Paroxysmal atrial fibrillation  Patient currently appears to be in sinus rhythm.  She was taken off of Eliquis approximately 1 month ago, but does not know why.  Controlled diabetes mellitus type 2 Home medication regimen it appears to include glipizide. -Hypoglycemic protocols -CBGs before every meal with sensitive SSI  Anxiety and depression -Will need to resume once dose confirmed on med reconciliation  Hyperlipidemia -Continue Crestor  Renal mass Patient noted to have a slowly enlarging upper pole right renal cell carcinoma CT from 07/2021.  She has been being followed by urology, but really does not want to undergo any kind of surgery at her age.  **Will need to complete med reconciliation once more by pharmacy.   DVT prophylaxis: Lovenox Advance Care Planning:   Code Status: DNR.  Confirmed with daughter present at bedside Consults: None  Family Communication: Daughter updated at bedside  Severity of Illness: The appropriate patient status for this patient is OBSERVATION. Observation status is judged to be reasonable and necessary in order to provide the required intensity of service to ensure the patient's safety. The patient's presenting symptoms, physical exam findings, and initial radiographic and laboratory data in the context of their medical condition is felt to place them at decreased risk for further  clinical deterioration. Furthermore, it is anticipated that the patient will be medically stable for discharge from the hospital within 2 midnights of admission.   Author: Norval Morton, MD 10/31/2021 3:04 PM  For on call review [email or web address].

## 2021-10-31 NOTE — ED Triage Notes (Signed)
Witness fall.  Pt was reaching for something and slipped out of her w/c no LOC no injury with exception of hematoma to the forehead right shoulder pain, and neck pain

## 2021-10-31 NOTE — ED Notes (Signed)
Shift change the nurses name was placed before 1900 another nurse came in  at 1900 and did  not cathe pt reports that ll or m   pt reports that she has not messaged me  verbal report given  the pt has not had her bp meds and she reports that she usually runs high

## 2021-11-01 DIAGNOSIS — Z66 Do not resuscitate: Secondary | ICD-10-CM | POA: Diagnosis present

## 2021-11-01 DIAGNOSIS — I129 Hypertensive chronic kidney disease with stage 1 through stage 4 chronic kidney disease, or unspecified chronic kidney disease: Secondary | ICD-10-CM | POA: Diagnosis present

## 2021-11-01 DIAGNOSIS — M25519 Pain in unspecified shoulder: Secondary | ICD-10-CM | POA: Diagnosis present

## 2021-11-01 DIAGNOSIS — W06XXXA Fall from bed, initial encounter: Secondary | ICD-10-CM | POA: Diagnosis present

## 2021-11-01 DIAGNOSIS — W050XXA Fall from non-moving wheelchair, initial encounter: Secondary | ICD-10-CM | POA: Diagnosis present

## 2021-11-01 DIAGNOSIS — E1122 Type 2 diabetes mellitus with diabetic chronic kidney disease: Secondary | ICD-10-CM | POA: Diagnosis present

## 2021-11-01 DIAGNOSIS — M545 Low back pain, unspecified: Secondary | ICD-10-CM | POA: Diagnosis present

## 2021-11-01 DIAGNOSIS — E785 Hyperlipidemia, unspecified: Secondary | ICD-10-CM | POA: Diagnosis present

## 2021-11-01 DIAGNOSIS — C641 Malignant neoplasm of right kidney, except renal pelvis: Secondary | ICD-10-CM | POA: Diagnosis present

## 2021-11-01 DIAGNOSIS — J441 Chronic obstructive pulmonary disease with (acute) exacerbation: Secondary | ICD-10-CM | POA: Diagnosis present

## 2021-11-01 DIAGNOSIS — M25562 Pain in left knee: Secondary | ICD-10-CM | POA: Diagnosis present

## 2021-11-01 DIAGNOSIS — S0003XA Contusion of scalp, initial encounter: Secondary | ICD-10-CM | POA: Diagnosis present

## 2021-11-01 DIAGNOSIS — E1142 Type 2 diabetes mellitus with diabetic polyneuropathy: Secondary | ICD-10-CM | POA: Diagnosis present

## 2021-11-01 DIAGNOSIS — L89312 Pressure ulcer of right buttock, stage 2: Secondary | ICD-10-CM | POA: Diagnosis present

## 2021-11-01 DIAGNOSIS — Z1152 Encounter for screening for COVID-19: Secondary | ICD-10-CM | POA: Diagnosis not present

## 2021-11-01 DIAGNOSIS — K219 Gastro-esophageal reflux disease without esophagitis: Secondary | ICD-10-CM | POA: Diagnosis present

## 2021-11-01 DIAGNOSIS — G8929 Other chronic pain: Secondary | ICD-10-CM | POA: Diagnosis present

## 2021-11-01 DIAGNOSIS — I480 Paroxysmal atrial fibrillation: Secondary | ICD-10-CM | POA: Diagnosis present

## 2021-11-01 DIAGNOSIS — M542 Cervicalgia: Secondary | ICD-10-CM | POA: Diagnosis present

## 2021-11-01 DIAGNOSIS — G63 Polyneuropathy in diseases classified elsewhere: Secondary | ICD-10-CM

## 2021-11-01 DIAGNOSIS — N1832 Chronic kidney disease, stage 3b: Secondary | ICD-10-CM | POA: Diagnosis present

## 2021-11-01 DIAGNOSIS — F32A Depression, unspecified: Secondary | ICD-10-CM | POA: Diagnosis present

## 2021-11-01 DIAGNOSIS — G20A1 Parkinson's disease without dyskinesia, without mention of fluctuations: Secondary | ICD-10-CM | POA: Diagnosis present

## 2021-11-01 DIAGNOSIS — J9601 Acute respiratory failure with hypoxia: Secondary | ICD-10-CM | POA: Diagnosis not present

## 2021-11-01 DIAGNOSIS — F419 Anxiety disorder, unspecified: Secondary | ICD-10-CM | POA: Diagnosis present

## 2021-11-01 DIAGNOSIS — E875 Hyperkalemia: Secondary | ICD-10-CM | POA: Diagnosis present

## 2021-11-01 DIAGNOSIS — R0902 Hypoxemia: Secondary | ICD-10-CM | POA: Diagnosis present

## 2021-11-01 LAB — CBC
HCT: 34.2 % — ABNORMAL LOW (ref 36.0–46.0)
Hemoglobin: 10.7 g/dL — ABNORMAL LOW (ref 12.0–15.0)
MCH: 31.3 pg (ref 26.0–34.0)
MCHC: 31.3 g/dL (ref 30.0–36.0)
MCV: 100 fL (ref 80.0–100.0)
Platelets: 144 10*3/uL — ABNORMAL LOW (ref 150–400)
RBC: 3.42 MIL/uL — ABNORMAL LOW (ref 3.87–5.11)
RDW: 15 % (ref 11.5–15.5)
WBC: 5.2 10*3/uL (ref 4.0–10.5)
nRBC: 0 % (ref 0.0–0.2)

## 2021-11-01 LAB — BASIC METABOLIC PANEL
Anion gap: 10 (ref 5–15)
BUN: 19 mg/dL (ref 8–23)
CO2: 25 mmol/L (ref 22–32)
Calcium: 8.7 mg/dL — ABNORMAL LOW (ref 8.9–10.3)
Chloride: 104 mmol/L (ref 98–111)
Creatinine, Ser: 1.18 mg/dL — ABNORMAL HIGH (ref 0.44–1.00)
GFR, Estimated: 46 mL/min — ABNORMAL LOW (ref >60)
Glucose, Bld: 188 mg/dL — ABNORMAL HIGH (ref 70–99)
Potassium: 4.3 mmol/L (ref 3.5–5.1)
Sodium: 139 mmol/L (ref 135–145)

## 2021-11-01 LAB — URINALYSIS, ROUTINE W REFLEX MICROSCOPIC
Bacteria, UA: NONE SEEN
Bilirubin Urine: NEGATIVE
Glucose, UA: NEGATIVE mg/dL
Ketones, ur: NEGATIVE mg/dL
Leukocytes,Ua: NEGATIVE
Nitrite: NEGATIVE
Protein, ur: NEGATIVE mg/dL
Specific Gravity, Urine: 1.008 (ref 1.005–1.030)
pH: 6 (ref 5.0–8.0)

## 2021-11-01 LAB — GLUCOSE, CAPILLARY
Glucose-Capillary: 112 mg/dL — ABNORMAL HIGH (ref 70–99)
Glucose-Capillary: 172 mg/dL — ABNORMAL HIGH (ref 70–99)
Glucose-Capillary: 196 mg/dL — ABNORMAL HIGH (ref 70–99)
Glucose-Capillary: 216 mg/dL — ABNORMAL HIGH (ref 70–99)

## 2021-11-01 MED ORDER — IPRATROPIUM-ALBUTEROL 0.5-2.5 (3) MG/3ML IN SOLN
3.0000 mL | Freq: Three times a day (TID) | RESPIRATORY_TRACT | Status: DC
  Administered 2021-11-01 – 2021-11-02 (×2): 3 mL via RESPIRATORY_TRACT
  Filled 2021-11-01 (×3): qty 3

## 2021-11-01 MED ORDER — ENOXAPARIN SODIUM 40 MG/0.4ML IJ SOSY
40.0000 mg | PREFILLED_SYRINGE | INTRAMUSCULAR | Status: DC
  Administered 2021-11-01 – 2021-11-02 (×2): 40 mg via SUBCUTANEOUS
  Filled 2021-11-01 (×2): qty 0.4

## 2021-11-01 MED ORDER — BUDESONIDE 0.5 MG/2ML IN SUSP
0.5000 mg | Freq: Two times a day (BID) | RESPIRATORY_TRACT | Status: DC
  Administered 2021-11-01 – 2021-11-03 (×4): 0.5 mg via RESPIRATORY_TRACT
  Filled 2021-11-01 (×4): qty 2

## 2021-11-01 MED ORDER — BUDESONIDE 0.5 MG/2ML IN SUSP: 0.5000 mg | Freq: Two times a day (BID) | RESPIRATORY_TRACT | Status: DC

## 2021-11-01 MED ORDER — METHYLPREDNISOLONE SODIUM SUCC 125 MG IJ SOLR
80.0000 mg | Freq: Every day | INTRAMUSCULAR | Status: DC
  Administered 2021-11-01 – 2021-11-03 (×3): 80 mg via INTRAVENOUS
  Filled 2021-11-01 (×3): qty 2

## 2021-11-01 MED ORDER — IPRATROPIUM-ALBUTEROL 0.5-2.5 (3) MG/3ML IN SOLN: 3.0000 mL | Freq: Three times a day (TID) | RESPIRATORY_TRACT | Status: DC

## 2021-11-01 NOTE — Progress Notes (Addendum)
Pt would like to receive a dose of Klonopin to help her sleep. This medication was just given at 2111.  Informed pt that she has already received her daily dose of Klonopin.

## 2021-11-01 NOTE — Progress Notes (Signed)
PROGRESS NOTE    Kristen Mason  RDE:[id number] DOB: 07/22/1934 DOA: 10/31/2021 PCP: Caprice Renshaw, MD   Brief Narrative:  87 y.o. female with medical history significant of hypertension, PAF, COPD, diabetes mellitus type 2 with peripheral neuropathy, Parkinson's, and GERD who presents after falling out of her wheelchair.  Patient was admitted for fall and COPD exacerbation.  Trauma work-up including CT head and cervical spine were negative for any acute pathology.   Assessment & Plan:  Principal Problem:   Fall from wheelchair Active Problems:   Acute respiratory failure with hypoxia (HCC)   COPD exacerbation (HCC)   Hyperkalemia   Sinus bradycardia   Transient hypotension   Hypocalcemia   Type 2 diabetes mellitus with diabetic polyneuropathy, with long-term current use of insulin (HCC)   Parkinson's disease   Depression   Anxiety   Frequent UTI   PAF (paroxysmal atrial fibrillation) (HCC)   Renal mass   Pressure injury of skin   Polyneuropathy associated with underlying disease ([place])   Acute respiratory failure with hypoxia secondary to suspected COPD exacerbation Patient still has significant abnormal breath sounds.  On 2 L nasal cannula.  We will continue Solumedrol q12h, Nebs/ IS/Flutter. Pulmicort.    Hyperkalemia Resolved  Transient hypotension Resolved   Sinus bradycardia  Chronic.  Largely asymptomatic   Fall from wheelchair  Patient reported bending over to pick up something as the cause for falling from her wheelchair.  Other reports noted that she fell out of bed. -Fall precautions -PT consulted to evaluate and treat.  Typically she transfers herself, we just need to make sure she is at baseline   Renal insufficiency chronic kidney disease stage IIIb Creatinine 1.47 with BUN 24.  Creatinine is now back to baseline of 1.1   Frequent UTIs UA is negative   Parkinson's disease Patient has resting tremor. -Delirium precautions -Continue Sinemet 25-109 mg 3  times daily and primidone 250 mg twice daily   Paroxysmal atrial fibrillation  Patient currently appears to be in sinus rhythm.  She was taken off of Eliquis approximately 1 month ago, but does not know why.  Likely secondary to falls   Controlled diabetes mellitus type 2 Home medication regimen it appears to include glipizide. -Sliding scale and Accu-Cheks   Anxiety and depression -Awaiting med rec completion   Hyperlipidemia -Continue Crestor   Renal mass Patient noted to have a slowly enlarging upper pole right renal cell carcinoma CT from 07/2021.  She has been being followed by urology, but really does not want to undergo any kind of surgery at her age.       DVT prophylaxis: Lovenox Code Status: DNR Family Communication:    Patient still has significant abnormal breath sounds requiring aggressive bronchodilators and IV steroids.  She is not safe for discharge     Body mass index is 26.12 kg/m.  Pressure Injury 10/31/21 Buttocks Right Stage 2 -  Partial thickness loss of dermis presenting as a shallow open injury with a red, pink wound bed without slough. (Active)  10/31/21 2045  Location: Buttocks  Location Orientation: Right  Staging: Stage 2 -  Partial thickness loss of dermis presenting as a shallow open injury with a red, pink wound bed without slough.  Wound Description (Comments):   Present on Admission: Yes        Subjective:  Seen and examined at bedside, she is sitting up in the recliner.  Still coughing up quite a bit, denies using any oxygen at home. Tells  me she is mostly wheelchair-bound but able to transfer herself with minimal assistance.  Examination:  General exam: Appears calm and comfortable  Respiratory system: Diffuse bilateral rhonchi Cardiovascular system: S1 & S2 heard, RRR. No JVD, murmurs, rubs, gallops or clicks. No pedal edema. Gastrointestinal system: Abdomen is nondistended, soft and nontender. No organomegaly or masses felt.  Normal bowel sounds heard. Central nervous system: Alert and oriented. No focal neurological deficits.  Has intentional tremors Extremities: Symmetric 5 x 5 power. Skin: No rashes, lesions or ulcers Psychiatry: Judgement and insight appear normal. Mood & affect appropriate.     Objective: Vitals:   10/31/21 2326 11/01/21 0652 11/01/21 0756 11/01/21 0930  BP: 139/60 (!) 142/43 (!) 155/52   Pulse: 85 (!) 59 63   Resp: '16 17 18   '$ Temp: 98.5 F (36.9 C)  97.8 F (36.6 C)   TempSrc: Oral  Oral   SpO2: 91%  97% 91%  Weight:      Height:        Intake/Output Summary (Last 24 hours) at 11/01/2021 1145 Last data filed at 11/01/2021 0900 Gross per 24 hour  Intake 243 ml  Output --  Net 243 ml   Filed Weights   10/31/21 0729 10/31/21 2029  Weight: 54 kg 71.2 kg     Data Reviewed:   CBC: Recent Labs  Lab 10/31/21 0900 11/01/21 0201  WBC 5.8 5.2  NEUTROABS 3.8  --   HGB 10.8* 10.7*  HCT 35.0* 34.2*  MCV 101.2* 100.0  PLT 160 782*   Basic Metabolic Panel: Recent Labs  Lab 10/31/21 0900 10/31/21 1209 10/31/21 1810 11/01/21 0201  NA 137  --   --  139  K 6.0* 6.0* 4.9 4.3  CL 101  --   --  104  CO2 30  --   --  25  GLUCOSE 94  --   --  188*  BUN 24*  --   --  19  CREATININE 1.47*  --   --  1.18*  CALCIUM 8.4*  --   --  8.7*   GFR: Estimated Creatinine Clearance: 35.1 mL/min (A) (by C-G formula based on SCr of 1.18 mg/dL (H)). Liver Function Tests: Recent Labs  Lab 10/31/21 0900  AST 18  ALT 8  ALKPHOS 97  BILITOT 0.1*  PROT 6.6  ALBUMIN 3.5   No results for input(s): "LIPASE", "AMYLASE" in the last 168 hours. No results for input(s): "AMMONIA" in the last 168 hours. Coagulation Profile: No results for input(s): "INR", "PROTIME" in the last 168 hours. Cardiac Enzymes: No results for input(s): "CKTOTAL", "CKMB", "CKMBINDEX", "TROPONINI" in the last 168 hours. BNP (last 3 results) No results for input(s): "PROBNP" in the last 8760 hours. HbA1C: Recent  Labs    10/31/21 0900  HGBA1C 5.8*   CBG: Recent Labs  Lab 10/31/21 2040 11/01/21 0758 11/01/21 1130  GLUCAP 214* 112* 172*   Lipid Profile: No results for input(s): "CHOL", "HDL", "LDLCALC", "TRIG", "CHOLHDL", "LDLDIRECT" in the last 72 hours. Thyroid Function Tests: No results for input(s): "TSH", "T4TOTAL", "FREET4", "T3FREE", "THYROIDAB" in the last 72 hours. Anemia Panel: No results for input(s): "VITAMINB12", "FOLATE", "FERRITIN", "TIBC", "IRON", "RETICCTPCT" in the last 72 hours. Sepsis Labs: No results for input(s): "PROCALCITON", "LATICACIDVEN" in the last 168 hours.  Recent Results (from the past 240 hour(s))  SARS Coronavirus 2 by RT PCR (hospital order, performed in [facility] hospital lab) *cepheid single result test* Anterior Nasal Swab     Status: None  Collection Time: 10/31/21  5:15 PM   Specimen: Anterior Nasal Swab  Result Value Ref Range Status   SARS Coronavirus 2 by RT PCR NEGATIVE NEGATIVE Final    Comment: (NOTE) SARS-CoV-2 target nucleic acids are NOT DETECTED.  The SARS-CoV-2 RNA is generally detectable in upper and lower respiratory specimens during the acute phase of infection. The lowest concentration of SARS-CoV-2 viral copies this assay can detect is 250 copies / mL. A negative result does not preclude SARS-CoV-2 infection and should not be used as the sole basis for treatment or other patient management decisions.  A negative result may occur with improper specimen collection / handling, submission of specimen other than nasopharyngeal swab, presence of viral mutation(s) within the areas targeted by this assay, and inadequate number of viral copies (<250 copies / mL). A negative result must be combined with clinical observations, patient history, and epidemiological information.  Fact Sheet for Patients:   https://www.patel.info/  Fact Sheet for Healthcare Providers: https://hall.com/  This  test is not yet approved or  cleared by the [place] FDA and has been authorized for detection and/or diagnosis of SARS-CoV-2 by FDA under an Emergency Use Authorization (EUA).  This EUA will remain in effect (meaning this test can be used) for the duration of the COVID-19 declaration under Section 564(b)(1) of the Act, 21 U.S.C. section 360bbb-3(b)(1), unless the authorization is terminated or revoked sooner.  Performed at [facility], [place] [address], [place], [place] [address]          Radiology Studies: CT Head Wo Contrast  Result Date: 10/31/2021 CLINICAL DATA:  [place].  Hit head. EXAM: CT HEAD WITHOUT CONTRAST CT CERVICAL SPINE WITHOUT CONTRAST TECHNIQUE: Multidetector CT imaging of the head and cervical spine was performed following the standard protocol without intravenous contrast. Multiplanar CT image reconstructions of the cervical spine were also generated. RADIATION DOSE REDUCTION: This exam was performed according to the departmental dose-optimization program which includes automated exposure control, adjustment of the mA and/or kV according to patient size and/or use of iterative reconstruction technique. COMPARISON:  Prior study 07/27/2021 FINDINGS: CT HEAD FINDINGS Brain: Stable age related cerebral atrophy, ventriculomegaly and periventricular white matter disease. No extra-axial fluid collections are identified. No CT findings for acute hemispheric infarction or intracranial hemorrhage. No mass lesions. The brainstem and cerebellum are normal. Vascular: Stable vascular calcifications. No aneurysm or hyperdense vessels. Skull: No acute skull fracture or worrisome bone lesion. Sinuses/Orbits: The paranasal sinuses and mastoid air cells are clear. The globes are intact. Other: Small left frontal scalp hematoma without underlying fracture. CT CERVICAL SPINE FINDINGS Alignment: Normal Skull base and vertebrae: No acute fracture. No primary bone lesion or focal pathologic  process. Soft tissues and spinal canal: No prevertebral fluid or swelling. No visible canal hematoma. Disc levels: Stable advanced degenerative changes at C1-2. The spinal canal is fairly generous. No large disc protrusions or significant canal stenosis. Stable multilevel foraminal narrowing due to facet disease and uncinate spurring. Upper chest: The lung apices are grossly clear. Other: Stable massive multinodular thyroid goiter unchanged since 2018. IMPRESSION: 1. Stable age related cerebral atrophy, ventriculomegaly and periventricular white matter disease. 2. Small left frontal scalp hematoma without underlying fracture. 3. No acute intracranial findings. 4. Normal alignment of the cervical spine without acute fracture. 5. Stable massive multinodular thyroid goiter unchanged since 2018. Electronically Signed   By: Marijo Sanes M.D.   On: 10/31/2021 09:02   CT Cervical Spine Wo Contrast  Result Date: 10/31/2021 CLINICAL  DATA:  [place].  Hit head. EXAM: CT HEAD WITHOUT CONTRAST CT CERVICAL SPINE WITHOUT CONTRAST TECHNIQUE: Multidetector CT imaging of the head and cervical spine was performed following the standard protocol without intravenous contrast. Multiplanar CT image reconstructions of the cervical spine were also generated. RADIATION DOSE REDUCTION: This exam was performed according to the departmental dose-optimization program which includes automated exposure control, adjustment of the mA and/or kV according to patient size and/or use of iterative reconstruction technique. COMPARISON:  Prior study 07/27/2021 FINDINGS: CT HEAD FINDINGS Brain: Stable age related cerebral atrophy, ventriculomegaly and periventricular white matter disease. No extra-axial fluid collections are identified. No CT findings for acute hemispheric infarction or intracranial hemorrhage. No mass lesions. The brainstem and cerebellum are normal. Vascular: Stable vascular calcifications. No aneurysm or hyperdense vessels. Skull: No  acute skull fracture or worrisome bone lesion. Sinuses/Orbits: The paranasal sinuses and mastoid air cells are clear. The globes are intact. Other: Small left frontal scalp hematoma without underlying fracture. CT CERVICAL SPINE FINDINGS Alignment: Normal Skull base and vertebrae: No acute fracture. No primary bone lesion or focal pathologic process. Soft tissues and spinal canal: No prevertebral fluid or swelling. No visible canal hematoma. Disc levels: Stable advanced degenerative changes at C1-2. The spinal canal is fairly generous. No large disc protrusions or significant canal stenosis. Stable multilevel foraminal narrowing due to facet disease and uncinate spurring. Upper chest: The lung apices are grossly clear. Other: Stable massive multinodular thyroid goiter unchanged since 2018. IMPRESSION: 1. Stable age related cerebral atrophy, ventriculomegaly and periventricular white matter disease. 2. Small left frontal scalp hematoma without underlying fracture. 3. No acute intracranial findings. 4. Normal alignment of the cervical spine without acute fracture. 5. Stable massive multinodular thyroid goiter unchanged since 2018. Electronically Signed   By: Marijo Sanes M.D.   On: 10/31/2021 09:02   DG Chest 2 View  Result Date: 10/31/2021 CLINICAL DATA:  Fall from seated position, wheezing EXAM: CHEST - 2 VIEW COMPARISON:  Chest CT July 27, 2021, chest x-ray July 27, 2021 FINDINGS: Stable mediastinal contours. Unchanged cardiomegaly. No focal pulmonary opacity. No large pleural effusion or pneumothorax. No acute osseous abnormality. Severe bilateral shoulder osteoarthritis. The visualized upper abdomen is unremarkable. IMPRESSION: No acute cardiopulmonary abnormality. Electronically Signed   By: Beryle Flock M.D.   On: 10/31/2021 08:47   DG Shoulder Right  Result Date: 10/31/2021 CLINICAL DATA:  Fall with right shoulder pain EXAM: RIGHT SHOULDER - 2 VIEW COMPARISON:  None Available. FINDINGS: There is no  evidence of fracture or dislocation. Osteopenia with degenerative spurring at the glenohumeral and acromioclavicular joints. Subacromial space narrowing with inferior acromial spurring. Remote lateral right rib fractures. IMPRESSION: Degenerated shoulder without acute finding. Electronically Signed   By: Jorje Guild M.D.   On: 10/31/2021 08:46        Scheduled Meds:  budesonide (PULMICORT) nebulizer solution  0.5 mg Nebulization BID   carbidopa-levodopa  1 tablet Oral TID   clonazePAM  1 mg Oral QHS   dicyclomine  10 mg Oral TID AC & HS   enoxaparin (LOVENOX) injection  30 mg Subcutaneous Q24H   guaiFENesin  600 mg Oral BID   insulin aspart  0-9 Units Subcutaneous TID WC   ipratropium-albuterol  3 mL Nebulization TID   methylPREDNISolone (SOLU-MEDROL) injection  80 mg Intravenous Daily   primidone  250 mg Oral BID   rosuvastatin  5 mg Oral Daily   sodium chloride flush  3 mL Intravenous Q12H   Continuous Infusions:  sodium  chloride 50 mL/hr at 10/31/21 2101   cefTRIAXone (ROCEPHIN)  IV Stopped (10/31/21 1927)     LOS: 0 days   Time spent= 35 mins    Jamen Loiseau Arsenio Loader, MD [facility]  If 7PM-7AM, please contact night-coverage  11/01/2021, 11:45 AM

## 2021-11-01 NOTE — Evaluation (Signed)
Physical Therapy Evaluation Patient Details Name: Kristen Mason MRN: [id number] DOB: 1934/03/04 Today's Date: 11/01/2021  History of Present Illness  87 y/o female presented to ED on 10/31/21 after fall out of wheelchair. Imaging negative for acute findings. PMH: HTN, PAF, COPD, T2DM with peripheral neuropathy, Parkinson's.  Clinical Impression  Patient admitted with the above. PTA, patient from SNF where she had assistance with transfers and ADLs from staff. Patient presents with weakness, impaired balance, and decreased activity tolerance. SpO2 >92% during mobility on RA. Patient required modA for bed mobility and squat pivot transfer to recliner. Patient very tremulous throughout session which she states is normal for her. Patient will benefit from skilled PT services during acute stay to address listed deficits. Recommend return to SNF at discharge.        Recommendations for follow up therapy are one component of a multi-disciplinary discharge planning process, led by the attending physician.  Recommendations may be updated based on patient status, additional functional criteria and insurance authorization.  Follow Up Recommendations Skilled nursing-short term rehab (<3 hours/day) Can patient physically be transported by private vehicle: No    Assistance Recommended at Discharge Frequent or constant Supervision/Assistance  Patient can return home with the following       Equipment Recommendations None recommended by PT  Recommendations for Other Services       Functional Status Assessment Patient has had a recent decline in their functional status and demonstrates the ability to make significant improvements in function in a reasonable and predictable amount of time.     Precautions / Restrictions Precautions Precautions: Fall Precaution Comments: watch O2 Restrictions Weight Bearing Restrictions: No      Mobility  Bed Mobility Overal bed mobility: Needs Assistance Bed  Mobility: Supine to Sit     Supine to sit: Mod assist     General bed mobility comments: assist for trunk elevation and repositioning hips towards EOB. Very tremulous with any movement and at rest    Transfers Overall transfer level: Needs assistance Equipment used: None Transfers: Bed to chair/wheelchair/BSC       Squat pivot transfers: Mod assist     General transfer comment: blocking of feet as feet slip with grippy socks. ModA for balance and boost over to chair    Ambulation/Gait                  Stairs            Wheelchair Mobility    Modified Rankin (Stroke Patients Only)       Balance Overall balance assessment: Needs assistance Sitting-balance support: No upper extremity supported, Feet supported Sitting balance-Leahy Scale: Fair     Standing balance support: Single extremity supported, During functional activity Standing balance-Leahy Scale: Poor Standing balance comment: assist to transfer to recliner                             Pertinent Vitals/Pain Pain Assessment Pain Assessment: No/denies pain    Home Living Family/patient expects to be discharged to:: Skilled nursing facility                   Additional Comments: resides at SNF    Prior Function Prior Level of Function : Needs assist             Mobility Comments: staff assists with transfers to/from w/c ADLs Comments: staff assist with all ADLs     Hand Dominance  Extremity/Trunk Assessment   Upper Extremity Assessment Upper Extremity Assessment: Generalized weakness    Lower Extremity Assessment Lower Extremity Assessment: Generalized weakness    Cervical / Trunk Assessment Cervical / Trunk Assessment: Kyphotic  Communication   Communication: No difficulties  Cognition Arousal/Alertness: Awake/alert Behavior During Therapy: WFL for tasks assessed/performed Overall Cognitive Status: No family/caregiver present to determine  baseline cognitive functioning                                 General Comments: seems at baseline. Suspect memory deficits at baseline        General Comments General comments (skin integrity, edema, etc.): SpO2 >92% during mobility on RA. Donned 1L O2 at end of session for safety    Exercises     Assessment/Plan    PT Assessment Patient needs continued PT services  PT Problem List Decreased strength;Decreased balance;Decreased activity tolerance;Decreased mobility       PT Treatment Interventions DME instruction;Functional mobility training;Therapeutic activities;Therapeutic exercise;Balance training;Patient/family education    PT Goals (Current goals can be found in the Care Plan section)  Acute Rehab PT Goals Patient Stated Goal: to have better care at facility PT Goal Formulation: With patient Time For Goal Achievement: 11/15/21 Potential to Achieve Goals: Fair    Frequency Min 2X/week     Co-evaluation               AM-PAC PT "6 Clicks" Mobility  Outcome Measure Help needed turning from your back to your side while in a flat bed without using bedrails?: A Lot Help needed moving from lying on your back to sitting on the side of a flat bed without using bedrails?: A Lot Help needed moving to and from a bed to a chair (including a wheelchair)?: A Lot Help needed standing up from a chair using your arms (e.g., wheelchair or bedside chair)?: A Lot Help needed to walk in hospital room?: Total Help needed climbing 3-5 steps with a railing? : Total 6 Click Score: 10    End of Session Equipment Utilized During Treatment: Gait belt Activity Tolerance: Patient tolerated treatment well Patient left: in chair;with call bell/phone within reach;with chair alarm set Nurse Communication: Mobility status PT Visit Diagnosis: Muscle weakness (generalized) (M62.81);Unsteadiness on feet (R26.81)    Time: 1829-9371 PT Time Calculation (min) (ACUTE ONLY): 24  min   Charges:   PT Evaluation $PT Eval Moderate Complexity: 1 Mod PT Treatments $Therapeutic Activity: 8-22 mins        Ammie Warrick A. Gilford Rile PT, DPT Acute Rehabilitation Services Office [phone]   Linna Hoff 11/01/2021, 10:54 AM

## 2021-11-02 DIAGNOSIS — J9601 Acute respiratory failure with hypoxia: Secondary | ICD-10-CM | POA: Diagnosis not present

## 2021-11-02 DIAGNOSIS — J441 Chronic obstructive pulmonary disease with (acute) exacerbation: Secondary | ICD-10-CM | POA: Diagnosis not present

## 2021-11-02 DIAGNOSIS — W050XXA Fall from non-moving wheelchair, initial encounter: Secondary | ICD-10-CM | POA: Diagnosis not present

## 2021-11-02 LAB — URINE CULTURE: Culture: NO GROWTH

## 2021-11-02 LAB — GLUCOSE, CAPILLARY
Glucose-Capillary: 100 mg/dL — ABNORMAL HIGH (ref 70–99)
Glucose-Capillary: 192 mg/dL — ABNORMAL HIGH (ref 70–99)
Glucose-Capillary: 226 mg/dL — ABNORMAL HIGH (ref 70–99)
Glucose-Capillary: 125 mg/dL — ABNORMAL HIGH (ref 70–99)

## 2021-11-02 MED ORDER — FLUCONAZOLE 150 MG PO TABS
150.0000 mg | ORAL_TABLET | Freq: Once | ORAL | Status: AC
  Administered 2021-11-02: 150 mg via ORAL
  Filled 2021-11-02: qty 1

## 2021-11-02 MED ORDER — DOXYCYCLINE HYCLATE 100 MG PO TABS
100.0000 mg | ORAL_TABLET | Freq: Two times a day (BID) | ORAL | Status: DC
  Administered 2021-11-02 – 2021-11-03 (×3): 100 mg via ORAL
  Filled 2021-11-02 (×3): qty 1

## 2021-11-02 MED ORDER — IPRATROPIUM-ALBUTEROL 0.5-2.5 (3) MG/3ML IN SOLN
3.0000 mL | Freq: Two times a day (BID) | RESPIRATORY_TRACT | Status: DC
  Administered 2021-11-02 – 2021-11-03 (×2): 3 mL via RESPIRATORY_TRACT
  Filled 2021-11-02 (×2): qty 3

## 2021-11-02 NOTE — Progress Notes (Signed)
PROGRESS NOTE    Kristen Mason  HBZ:[id number] DOB: 11/17/1934 DOA: 10/31/2021 PCP: Caprice Renshaw, MD   Brief Narrative:  87 y.o. female with medical history significant of hypertension, PAF, COPD, diabetes mellitus type 2 with peripheral neuropathy, Parkinson's, and GERD who presents after falling out of her wheelchair.  Patient was admitted for fall and COPD exacerbation.  Trauma work-up including CT head and cervical spine were negative for any acute pathology.  Getting IV steroids, bronchodilators.   Assessment & Plan:  Principal Problem:   Fall from wheelchair Active Problems:   Acute respiratory failure with hypoxia (HCC)   COPD exacerbation (HCC)   Hyperkalemia   Sinus bradycardia   Transient hypotension   Hypocalcemia   Type 2 diabetes mellitus with diabetic polyneuropathy, with long-term current use of insulin (HCC)   Parkinson's disease   Depression   Anxiety   Frequent UTI   PAF (paroxysmal atrial fibrillation) (HCC)   Renal mass   Pressure injury of skin   Polyneuropathy associated with underlying disease ([place])   Acute respiratory failure with hypoxia secondary to suspected COPD exacerbation Patient still has quite a bit of abnormal breath sounds but slowly weaned off oxygen over the last 24 hours.  Continue IV steroids, bronchodilators, I-S/flutter valve.  Out of bed to chair.  Change antibiotics to p.o. doxycycline for 3 more days.    Hyperkalemia Resolved  Transient hypotension Resolved   Sinus bradycardia  Chronic.  Largely asymptomatic   Fall from wheelchair  Patient reported bending over to pick up something as the cause for falling from her wheelchair.  Other reports noted that she fell out of bed. -Fall precautions -PT consulted to evaluate and treat.  Recommending SNF   Renal insufficiency chronic kidney disease stage IIIb Creatinine 1.47 with BUN 24.  Creatinine is now back to baseline of 1.1   Frequent UTIs UA is negative   Parkinson's  disease Patient has resting tremor. -Delirium precautions -Continue Sinemet 25-109 mg 3 times daily and primidone 250 mg twice daily   Paroxysmal atrial fibrillation  Patient currently appears to be in sinus rhythm.  She was taken off of Eliquis approximately 1 month ago, but does not know why.  Likely secondary to falls   Controlled diabetes mellitus type 2 Home medication regimen it appears to include glipizide. -Sliding scale and Accu-Cheks   Anxiety and depression -Awaiting med rec completion   Hyperlipidemia -Continue Crestor   Renal mass Patient noted to have a slowly enlarging upper pole right renal cell carcinoma CT from 07/2021.  She has been being followed by urology, but really does not want to undergo any kind of surgery at her age.       DVT prophylaxis: Lovenox Code Status: DNR Family Communication:    Patient still has significant abnormal breath sounds requiring aggressive bronchodilators and IV steroids.  She is not safe for discharge     Body mass index is 26.01 kg/m.  Pressure Injury 10/31/21 Buttocks Right Stage 2 -  Partial thickness loss of dermis presenting as a shallow open injury with a red, pink wound bed without slough. (Active)  10/31/21 2045  Location: Buttocks  Location Orientation: Right  Staging: Stage 2 -  Partial thickness loss of dermis presenting as a shallow open injury with a red, pink wound bed without slough.  Wound Description (Comments):   Present on Admission: Yes        Subjective:  Seen and examined at bedside, breath sounds a little better this morning  but still quite congested..  Examination:  Constitutional: Not in acute distress Respiratory: Diffuse bilateral rhonchi but slightly improved compared to yesterday Cardiovascular: Normal sinus rhythm, no rubs Abdomen: Nontender nondistended good bowel sounds Musculoskeletal: No edema noted Skin: No rashes seen Neurologic: CN 2-12 grossly intact.  And nonfocal.   Intention tremors Psychiatric: Normal judgment and insight. Alert and oriented x 3. Normal mood.  Objective: Vitals:   11/02/21 0500 11/02/21 0604 11/02/21 0738 11/02/21 0831  BP:  (!) 158/53 (!) 177/81   Pulse:  (!) 57 (!) 58 60  Resp:  '17 17 16  '$ Temp:  98.4 F (36.9 C) 97.8 F (36.6 C)   TempSrc:   Oral   SpO2:  96% 98% 98%  Weight: 70.9 kg     Height:        Intake/Output Summary (Last 24 hours) at 11/02/2021 1033 Last data filed at 11/02/2021 0617 Gross per 24 hour  Intake 1841.13 ml  Output 2100 ml  Net -258.87 ml   Filed Weights   10/31/21 0729 10/31/21 2029 11/02/21 0500  Weight: 54 kg 71.2 kg 70.9 kg     Data Reviewed:   CBC: Recent Labs  Lab 10/31/21 0900 11/01/21 0201  WBC 5.8 5.2  NEUTROABS 3.8  --   HGB 10.8* 10.7*  HCT 35.0* 34.2*  MCV 101.2* 100.0  PLT 160 294*   Basic Metabolic Panel: Recent Labs  Lab 10/31/21 0900 10/31/21 1209 10/31/21 1810 11/01/21 0201  NA 137  --   --  139  K 6.0* 6.0* 4.9 4.3  CL 101  --   --  104  CO2 30  --   --  25  GLUCOSE 94  --   --  188*  BUN 24*  --   --  19  CREATININE 1.47*  --   --  1.18*  CALCIUM 8.4*  --   --  8.7*   GFR: Estimated Creatinine Clearance: 35.1 mL/min (A) (by C-G formula based on SCr of 1.18 mg/dL (H)). Liver Function Tests: Recent Labs  Lab 10/31/21 0900  AST 18  ALT 8  ALKPHOS 97  BILITOT 0.1*  PROT 6.6  ALBUMIN 3.5   No results for input(s): "LIPASE", "AMYLASE" in the last 168 hours. No results for input(s): "AMMONIA" in the last 168 hours. Coagulation Profile: No results for input(s): "INR", "PROTIME" in the last 168 hours. Cardiac Enzymes: No results for input(s): "CKTOTAL", "CKMB", "CKMBINDEX", "TROPONINI" in the last 168 hours. BNP (last 3 results) No results for input(s): "PROBNP" in the last 8760 hours. HbA1C: Recent Labs    10/31/21 0900  HGBA1C 5.8*   CBG: Recent Labs  Lab 11/01/21 0758 11/01/21 1130 11/01/21 1648 11/01/21 2003 11/02/21 0733  GLUCAP  112* 172* 196* 216* 100*   Lipid Profile: No results for input(s): "CHOL", "HDL", "LDLCALC", "TRIG", "CHOLHDL", "LDLDIRECT" in the last 72 hours. Thyroid Function Tests: No results for input(s): "TSH", "T4TOTAL", "FREET4", "T3FREE", "THYROIDAB" in the last 72 hours. Anemia Panel: No results for input(s): "VITAMINB12", "FOLATE", "FERRITIN", "TIBC", "IRON", "RETICCTPCT" in the last 72 hours. Sepsis Labs: No results for input(s): "PROCALCITON", "LATICACIDVEN" in the last 168 hours.  Recent Results (from the past 240 hour(s))  SARS Coronavirus 2 by RT PCR (hospital order, performed in [facility] hospital lab) *cepheid single result test* Anterior Nasal Swab     Status: None   Collection Time: 10/31/21  5:15 PM   Specimen: Anterior Nasal Swab  Result Value Ref Range Status   SARS Coronavirus  2 by RT PCR NEGATIVE NEGATIVE Final    Comment: (NOTE) SARS-CoV-2 target nucleic acids are NOT DETECTED.  The SARS-CoV-2 RNA is generally detectable in upper and lower respiratory specimens during the acute phase of infection. The lowest concentration of SARS-CoV-2 viral copies this assay can detect is 250 copies / mL. A negative result does not preclude SARS-CoV-2 infection and should not be used as the sole basis for treatment or other patient management decisions.  A negative result may occur with improper specimen collection / handling, submission of specimen other than nasopharyngeal swab, presence of viral mutation(s) within the areas targeted by this assay, and inadequate number of viral copies (<250 copies / mL). A negative result must be combined with clinical observations, patient history, and epidemiological information.  Fact Sheet for Patients:   https://www.patel.info/  Fact Sheet for Healthcare Providers: https://hall.com/  This test is not yet approved or  cleared by the [place] FDA and has been authorized for detection and/or  diagnosis of SARS-CoV-2 by FDA under an Emergency Use Authorization (EUA).  This EUA will remain in effect (meaning this test can be used) for the duration of the COVID-19 declaration under Section 564(b)(1) of the Act, 21 U.S.C. section 360bbb-3(b)(1), unless the authorization is terminated or revoked sooner.  Performed at [facility], [place] [address] [place], [place] [address]   Urine Culture     Status: None   Collection Time: 10/31/21  7:26 PM   Specimen: Urine, Clean Catch  Result Value Ref Range Status   Specimen Description URINE, CLEAN CATCH  Final   Special Requests NONE  Final   Culture   Final    NO GROWTH Performed at [facility], [place] [address] [place], [place] [address]    Report Status 11/02/2021 FINAL  Final         Radiology Studies: No results found.      Scheduled Meds:  budesonide (PULMICORT) nebulizer solution  0.5 mg Nebulization BID   carbidopa-levodopa  1 tablet Oral TID   clonazePAM  1 mg Oral QHS   dicyclomine  10 mg Oral TID AC & HS   enoxaparin (LOVENOX) injection  40 mg Subcutaneous Q24H   fluconazole  150 mg Oral Once   guaiFENesin  600 mg Oral BID   insulin aspart  0-9 Units Subcutaneous TID WC   ipratropium-albuterol  3 mL Nebulization BID   methylPREDNISolone (SOLU-MEDROL) injection  80 mg Intravenous Daily   primidone  250 mg Oral BID   rosuvastatin  5 mg Oral Daily   sodium chloride flush  3 mL Intravenous Q12H   Continuous Infusions:  sodium chloride 50 mL/hr at 10/31/21 2101   cefTRIAXone (ROCEPHIN)  IV 1 g (11/01/21 1817)     LOS: 1 day   Time spent= 35 mins    Shawnte Demarest Arsenio Loader, MD [facility]  If 7PM-7AM, please contact night-coverage  11/02/2021, 10:33 AM

## 2021-11-03 DIAGNOSIS — W050XXA Fall from non-moving wheelchair, initial encounter: Secondary | ICD-10-CM | POA: Diagnosis not present

## 2021-11-03 DIAGNOSIS — J9601 Acute respiratory failure with hypoxia: Secondary | ICD-10-CM | POA: Diagnosis not present

## 2021-11-03 LAB — GLUCOSE, CAPILLARY
Glucose-Capillary: 95 mg/dL (ref 70–99)
Glucose-Capillary: 115 mg/dL — ABNORMAL HIGH (ref 70–99)

## 2021-11-03 MED ORDER — ASPIRIN 81 MG PO TBEC
81.0000 mg | DELAYED_RELEASE_TABLET | Freq: Every day | ORAL | Status: DC
  Administered 2021-11-03: 81 mg via ORAL
  Filled 2021-11-03: qty 1

## 2021-11-03 MED ORDER — DOXYCYCLINE HYCLATE 100 MG PO TABS: 100.0000 mg | ORAL_TABLET | Freq: Two times a day (BID) | ORAL | 0 refills | Status: AC

## 2021-11-03 MED ORDER — MELATONIN 3 MG PO TABS
3.0000 mg | ORAL_TABLET | Freq: Every day | ORAL | Status: DC
  Administered 2021-11-03: 3 mg via ORAL
  Filled 2021-11-03: qty 1

## 2021-11-03 MED ORDER — PREGABALIN 50 MG PO CAPS: 50.0000 mg | ORAL_CAPSULE | Freq: Two times a day (BID) | ORAL | 0 refills | Status: AC

## 2021-11-03 MED ORDER — CLONAZEPAM 1 MG PO TABS: 1.0000 mg | ORAL_TABLET | Freq: Every day | ORAL | 0 refills | Status: AC

## 2021-11-03 MED ORDER — CITALOPRAM HYDROBROMIDE 40 MG PO TABS
40.0000 mg | ORAL_TABLET | Freq: Every day | ORAL | Status: DC
  Administered 2021-11-03: 40 mg via ORAL
  Filled 2021-11-03: qty 1

## 2021-11-03 MED ORDER — BUSPIRONE HCL 5 MG PO TABS
5.0000 mg | ORAL_TABLET | Freq: Three times a day (TID) | ORAL | Status: DC
  Administered 2021-11-03: 5 mg via ORAL
  Filled 2021-11-03: qty 1

## 2021-11-03 MED ORDER — ALBUTEROL SULFATE HFA 108 (90 BASE) MCG/ACT IN AERS: 2.0000 | INHALATION_SPRAY | Freq: Four times a day (QID) | RESPIRATORY_TRACT | 2 refills | Status: AC | PRN

## 2021-11-03 MED ORDER — AMLODIPINE BESYLATE 5 MG PO TABS
5.0000 mg | ORAL_TABLET | Freq: Every day | ORAL | Status: DC
  Administered 2021-11-03: 5 mg via ORAL
  Filled 2021-11-03: qty 1

## 2021-11-03 MED ORDER — PREDNISONE 10 MG PO TABS: 40.0000 mg | ORAL_TABLET | Freq: Every day | ORAL | 0 refills | Status: AC

## 2021-11-03 NOTE — TOC Transition Note (Signed)
Transition of Care [facility]) - CM/SW Discharge Note   Patient Details  Name: Kristen Mason MRN: [id number] Date of Birth: [birth year]  [facility] CM/SW Contact:  Tresa Endo Phone Number: 11/03/2021, 3:01 PM   Clinical Narrative:    Patient will DC to: [place] date: 11/04/18 Family notified: Attempted Pt Daughter Transport by: Corey Harold   Per MD patient ready for DC to [facility]. RN to call report prior to discharge [phone]). RN, patient, patient's family, and facility notified of DC. Discharge Summary and FL2 sent to facility. DC packet on chart. Ambulance transport requested for patient.   CSW will sign off for now as social work intervention is no longer needed. Please consult [place] again if new needs arise.           Patient Goals and CMS Choice        Discharge Placement                       Discharge Plan and Services                                     Social Determinants of Health (SDOH) Interventions     Readmission Risk Interventions     No data to display

## 2021-11-03 NOTE — Discharge Summary (Signed)
Physician Discharge Summary  Bryna Razavi NFA:[id number] DOB: 27-Sep-1934 DOA: 10/31/2021  PCP: Caprice Renshaw, MD  Admit date: 10/31/2021 Discharge date: 11/03/2021  Admitted From: LTC Disposition:  LTC  Recommendations for Outpatient Follow-up:  Follow up with PCP in 1-2 weeks Please obtain BMP/CBC in one week your next doctors visit.  Po Prednisone prescribed. PO Doxycycline.    Discharge Condition: Stable CODE STATUS: DNR Diet recommendation: Diabetic  Brief/Interim Summary: 87 y.o. female with medical history significant of hypertension, PAF, COPD, diabetes mellitus type 2 with peripheral neuropathy, Parkinson's, and GERD who presents after falling out of her wheelchair.  Patient was admitted for fall and COPD exacerbation.  Trauma work-up including CT head and cervical spine were negative for any acute pathology.  Getting IV steroids, bronchodilators.  Over the course of several days her breathing symptoms significantly improved therefore transition to oral antibiotics and steroids.  Today she is medically stable for discharge and return back to her facility.     Assessment & Plan:  Principal Problem:   Fall from wheelchair Active Problems:   Acute respiratory failure with hypoxia (HCC)   COPD exacerbation (HCC)   Hyperkalemia   Sinus bradycardia   Transient hypotension   Hypocalcemia   Type 2 diabetes mellitus with diabetic polyneuropathy, with long-term current use of insulin (HCC)   Parkinson's disease   Depression   Anxiety   Frequent UTI   PAF (paroxysmal atrial fibrillation) (HCC)   Renal mass   Pressure injury of skin   Polyneuropathy associated with underlying disease ([place])   Acute respiratory failure with hypoxia secondary to suspected COPD exacerbation Significantly feeling better, weaned off oxygen continue home bronchodilators, I-S/flutter valve.  We will transition to oral doxycycline and steroids prescribed at discharge.    Hyperkalemia Resolved    Transient hypotension Resolved   Sinus bradycardia  Chronic.  Largely asymptomatic   Fall from wheelchair  Patient reported bending over to pick up something as the cause for falling from her wheelchair.  Other reports noted that she fell out of bed. -Fall precautions -PT consulted to evaluate and treat.  Recommending SNF   Renal insufficiency chronic kidney disease stage IIIb Creatinine 1.47 with BUN 24.  Creatinine is now back to baseline of 1.1   Frequent UTIs UA is negative   Parkinson's disease Patient has resting tremor. -Delirium precautions -Continue Sinemet 25-109 mg 3 times daily and primidone 250 mg twice daily   Paroxysmal atrial fibrillation  Patient currently appears to be in sinus rhythm.  She was taken off of Eliquis approximately 1 month ago, but does not know why.  Likely secondary to falls   Controlled diabetes mellitus type 2 Resume home regimen upon discharge   Anxiety and depression -Awaiting med rec completion   Hyperlipidemia -Continue Crestor   Renal mass Patient noted to have a slowly enlarging upper pole right renal cell carcinoma CT from 07/2021.  She has been being followed by urology, but really does not want to undergo any kind of surgery at her age.        Body mass index is 27.44 kg/m.  Pressure Injury 10/31/21 Buttocks Right Stage 2 -  Partial thickness loss of dermis presenting as a shallow open injury with a red, pink wound bed without slough. (Active)  10/31/21 2045  Location: Buttocks  Location Orientation: Right  Staging: Stage 2 -  Partial thickness loss of dermis presenting as a shallow open injury with a red, pink wound bed without slough.  Wound Description (Comments):  Present on Admission: Yes      Discharge Diagnoses:  Principal Problem:   Fall from wheelchair Active Problems:   Acute respiratory failure with hypoxia (HCC)   COPD exacerbation (HCC)   Hyperkalemia   Sinus bradycardia   Transient  hypotension   Hypocalcemia   Type 2 diabetes mellitus with diabetic polyneuropathy, with long-term current use of insulin (HCC)   Parkinson's disease   Depression   Anxiety   Frequent UTI   PAF (paroxysmal atrial fibrillation) (HCC)   Renal mass   Pressure injury of skin   Polyneuropathy associated with underlying disease ([place])      Consultations: None  Subjective: Feels well, back to her baseline  Discharge Exam: Vitals:   11/03/21 0904 11/03/21 1213  BP:  (!) 155/61  Pulse:  (!) 58  Resp:  16  Temp:  98.5 F (36.9 C)  SpO2: 94% 94%   Vitals:   11/03/21 0803 11/03/21 0903 11/03/21 0904 11/03/21 1213  BP: (!) 188/61   (!) 155/61  Pulse: (!) 55   (!) 58  Resp: 16   16  Temp: 97.8 F (36.6 C)   98.5 F (36.9 C)  TempSrc: Oral   Oral  SpO2: 97% 94% 94% 94%  Weight:      Height:        General: Pt is alert, awake, not in acute distress Cardiovascular: RRR, S1/S2 +, no rubs, no gallops Respiratory: CTA bilaterally, no wheezing, no rhonchi Abdominal: Soft, NT, ND, bowel sounds + Extremities: no edema, no cyanosis Intentional tremors  Discharge Instructions   Allergies as of 11/03/2021       Reactions   Codeine Nausea And Vomiting   Vomiting    Statins         Medication List     TAKE these medications    acetaminophen 325 MG tablet Commonly known as: TYLENOL Take 2 tablets (650 mg total) by mouth every 6 (six) hours as needed for mild pain (or Fever >/= 101).   albuterol 108 (90 Base) MCG/ACT inhaler Commonly known as: VENTOLIN HFA Inhale 2 puffs into the lungs every 6 (six) hours as needed for wheezing or shortness of breath.   amLODipine 5 MG tablet Commonly known as: NORVASC Take 5 mg by mouth daily.   aspirin EC 81 MG tablet Take 81 mg by mouth daily.   busPIRone 5 MG tablet Commonly known as: BUSPAR Take 5 mg by mouth 3 (three) times daily.   capsicum 0.075 % topical cream Commonly known as: ZOSTRIX Apply 1 Application  topically 3 (three) times daily.   carbidopa-levodopa 25-100 MG tablet Commonly known as: SINEMET IR Take 1 tablet by mouth 3 (three) times daily.   citalopram 20 MG tablet Commonly known as: CELEXA Take 20 mg by mouth daily.   clonazePAM 1 MG tablet Commonly known as: KLONOPIN Take 1 tablet (1 mg total) by mouth at bedtime.   CRANBERRY PO Take 2 tablets by mouth in the morning and at bedtime.   diclofenac Sodium 1 % Gel Commonly known as: VOLTAREN Apply 4 g topically in the morning, at noon, and at bedtime. Both knees   dicyclomine 10 MG capsule Commonly known as: BENTYL Take 10 mg by mouth 4 (four) times daily -  before meals and at bedtime.   doxycycline 100 MG tablet Commonly known as: VIBRA-TABS Take 1 tablet (100 mg total) by mouth every 12 (twelve) hours for 2 days.   fluticasone 50 MCG/ACT nasal spray Commonly known as:  FLONASE Place 1 spray into both nostrils daily.   glipiZIDE 5 MG tablet Commonly known as: GLUCOTROL Take 5 mg by mouth daily.   guaiFENesin 600 MG 12 hr tablet Commonly known as: MUCINEX Take 600 mg by mouth 2 (two) times daily.   hydrocortisone 25 MG suppository Commonly known as: ANUSOL-HC Place 25 mg rectally 2 (two) times daily as needed for hemorrhoids or anal itching.   hydrOXYzine 25 MG tablet Commonly known as: ATARAX Take 25 mg by mouth every 8 (eight) hours as needed for itching.   LACTOBACILLUS PROBIOTIC PO Take 1 capsule by mouth 2 (two) times daily.   lidocaine 4 % Place 1 patch onto the skin daily.   Magnesium 400 MG Tabs Take 400 mg by mouth daily.   pantoprazole 20 MG tablet Commonly known as: PROTONIX Take 20 mg by mouth daily.   polyethylene glycol powder 17 GM/SCOOP powder Commonly known as: GLYCOLAX/MIRALAX Take 17 g by mouth daily.   predniSONE 10 MG tablet Commonly known as: DELTASONE Take 4 tablets (40 mg total) by mouth daily for 2 days.   pregabalin 50 MG capsule Commonly known as: LYRICA Take 50  mg by mouth 2 (two) times daily.   primidone 250 MG tablet Commonly known as: MYSOLINE Take 250 mg by mouth 3 (three) times daily.   REFRESH TEARS OP Place 1 drop into both eyes in the morning, at noon, in the evening, and at bedtime.   rosuvastatin 5 MG tablet Commonly known as: CRESTOR Take 5 mg by mouth daily.   trimethoprim 100 MG tablet Commonly known as: TRIMPEX Take 100 mg by mouth daily.   Vitamin D (Ergocalciferol) 1.25 MG (50000 UNIT) Caps capsule Commonly known as: DRISDOL Take 50,000 Units by mouth every 7 (seven) days. Thursday        Follow-up Information     Schedule an appointment as soon as possible for a visit  with Caprice Renshaw, MD.   Specialty: Internal Medicine Contact information: [address] [place] [address] [phone]                Allergies  Allergen Reactions   Codeine Nausea And Vomiting    Vomiting    Statins     You were cared for by a hospitalist during your hospital stay. If you have any questions about your discharge medications or the care you received while you were in the hospital after you are discharged, you can call the unit and asked to speak with the hospitalist on call if the hospitalist that took care of you is not available. Once you are discharged, your primary care physician will handle any further medical issues. Please note that no refills for any discharge medications will be authorized once you are discharged, as it is imperative that you return to your primary care physician (or establish a relationship with a primary care physician if you do not have one) for your aftercare needs so that they can reassess your need for medications and monitor your lab values.   Procedures/Studies: CT Head Wo Contrast  Result Date: 10/31/2021 CLINICAL DATA:  [place].  Hit head. EXAM: CT HEAD WITHOUT CONTRAST CT CERVICAL SPINE WITHOUT CONTRAST TECHNIQUE: Multidetector CT imaging of the head and cervical spine was  performed following the standard protocol without intravenous contrast. Multiplanar CT image reconstructions of the cervical spine were also generated. RADIATION DOSE REDUCTION: This exam was performed according to the departmental dose-optimization program which includes automated exposure control, adjustment  of the mA and/or kV according to patient size and/or use of iterative reconstruction technique. COMPARISON:  Prior study 07/27/2021 FINDINGS: CT HEAD FINDINGS Brain: Stable age related cerebral atrophy, ventriculomegaly and periventricular white matter disease. No extra-axial fluid collections are identified. No CT findings for acute hemispheric infarction or intracranial hemorrhage. No mass lesions. The brainstem and cerebellum are normal. Vascular: Stable vascular calcifications. No aneurysm or hyperdense vessels. Skull: No acute skull fracture or worrisome bone lesion. Sinuses/Orbits: The paranasal sinuses and mastoid air cells are clear. The globes are intact. Other: Small left frontal scalp hematoma without underlying fracture. CT CERVICAL SPINE FINDINGS Alignment: Normal Skull base and vertebrae: No acute fracture. No primary bone lesion or focal pathologic process. Soft tissues and spinal canal: No prevertebral fluid or swelling. No visible canal hematoma. Disc levels: Stable advanced degenerative changes at C1-2. The spinal canal is fairly generous. No large disc protrusions or significant canal stenosis. Stable multilevel foraminal narrowing due to facet disease and uncinate spurring. Upper chest: The lung apices are grossly clear. Other: Stable massive multinodular thyroid goiter unchanged since 2018. IMPRESSION: 1. Stable age related cerebral atrophy, ventriculomegaly and periventricular white matter disease. 2. Small left frontal scalp hematoma without underlying fracture. 3. No acute intracranial findings. 4. Normal alignment of the cervical spine without acute fracture. 5. Stable massive  multinodular thyroid goiter unchanged since 2018. Electronically Signed   By: Marijo Sanes M.D.   On: 10/31/2021 09:02   CT Cervical Spine Wo Contrast  Result Date: 10/31/2021 CLINICAL DATA:  [place].  Hit head. EXAM: CT HEAD WITHOUT CONTRAST CT CERVICAL SPINE WITHOUT CONTRAST TECHNIQUE: Multidetector CT imaging of the head and cervical spine was performed following the standard protocol without intravenous contrast. Multiplanar CT image reconstructions of the cervical spine were also generated. RADIATION DOSE REDUCTION: This exam was performed according to the departmental dose-optimization program which includes automated exposure control, adjustment of the mA and/or kV according to patient size and/or use of iterative reconstruction technique. COMPARISON:  Prior study 07/27/2021 FINDINGS: CT HEAD FINDINGS Brain: Stable age related cerebral atrophy, ventriculomegaly and periventricular white matter disease. No extra-axial fluid collections are identified. No CT findings for acute hemispheric infarction or intracranial hemorrhage. No mass lesions. The brainstem and cerebellum are normal. Vascular: Stable vascular calcifications. No aneurysm or hyperdense vessels. Skull: No acute skull fracture or worrisome bone lesion. Sinuses/Orbits: The paranasal sinuses and mastoid air cells are clear. The globes are intact. Other: Small left frontal scalp hematoma without underlying fracture. CT CERVICAL SPINE FINDINGS Alignment: Normal Skull base and vertebrae: No acute fracture. No primary bone lesion or focal pathologic process. Soft tissues and spinal canal: No prevertebral fluid or swelling. No visible canal hematoma. Disc levels: Stable advanced degenerative changes at C1-2. The spinal canal is fairly generous. No large disc protrusions or significant canal stenosis. Stable multilevel foraminal narrowing due to facet disease and uncinate spurring. Upper chest: The lung apices are grossly clear. Other: Stable massive  multinodular thyroid goiter unchanged since 2018. IMPRESSION: 1. Stable age related cerebral atrophy, ventriculomegaly and periventricular white matter disease. 2. Small left frontal scalp hematoma without underlying fracture. 3. No acute intracranial findings. 4. Normal alignment of the cervical spine without acute fracture. 5. Stable massive multinodular thyroid goiter unchanged since 2018. Electronically Signed   By: Marijo Sanes M.D.   On: 10/31/2021 09:02   DG Chest 2 View  Result Date: 10/31/2021 CLINICAL DATA:  Fall from seated position, wheezing EXAM: CHEST - 2 VIEW COMPARISON:  Chest CT  July 27, 2021, chest x-ray July 27, 2021 FINDINGS: Stable mediastinal contours. Unchanged cardiomegaly. No focal pulmonary opacity. No large pleural effusion or pneumothorax. No acute osseous abnormality. Severe bilateral shoulder osteoarthritis. The visualized upper abdomen is unremarkable. IMPRESSION: No acute cardiopulmonary abnormality. Electronically Signed   By: Beryle Flock M.D.   On: 10/31/2021 08:47   DG Shoulder Right  Result Date: 10/31/2021 CLINICAL DATA:  Fall with right shoulder pain EXAM: RIGHT SHOULDER - 2 VIEW COMPARISON:  None Available. FINDINGS: There is no evidence of fracture or dislocation. Osteopenia with degenerative spurring at the glenohumeral and acromioclavicular joints. Subacromial space narrowing with inferior acromial spurring. Remote lateral right rib fractures. IMPRESSION: Degenerated shoulder without acute finding. Electronically Signed   By: Jorje Guild M.D.   On: 10/31/2021 08:46     The results of significant diagnostics from this hospitalization (including imaging, microbiology, ancillary and laboratory) are listed below for reference.     Microbiology: Recent Results (from the past 240 hour(s))  SARS Coronavirus 2 by RT PCR (hospital order, performed in [facility] hospital lab) *cepheid single result test* Anterior Nasal Swab     Status: None   Collection Time:  10/31/21  5:15 PM   Specimen: Anterior Nasal Swab  Result Value Ref Range Status   SARS Coronavirus 2 by RT PCR NEGATIVE NEGATIVE Final    Comment: (NOTE) SARS-CoV-2 target nucleic acids are NOT DETECTED.  The SARS-CoV-2 RNA is generally detectable in upper and lower respiratory specimens during the acute phase of infection. The lowest concentration of SARS-CoV-2 viral copies this assay can detect is 250 copies / mL. A negative result does not preclude SARS-CoV-2 infection and should not be used as the sole basis for treatment or other patient management decisions.  A negative result may occur with improper specimen collection / handling, submission of specimen other than nasopharyngeal swab, presence of viral mutation(s) within the areas targeted by this assay, and inadequate number of viral copies (<250 copies / mL). A negative result must be combined with clinical observations, patient history, and epidemiological information.  Fact Sheet for Patients:   https://www.patel.info/  Fact Sheet for Healthcare Providers: https://hall.com/  This test is not yet approved or  cleared by the [place] FDA and has been authorized for detection and/or diagnosis of SARS-CoV-2 by FDA under an Emergency Use Authorization (EUA).  This EUA will remain in effect (meaning this test can be used) for the duration of the COVID-19 declaration under Section 564(b)(1) of the Act, 21 U.S.C. section 360bbb-3(b)(1), unless the authorization is terminated or revoked sooner.  Performed at [facility], [place] [address] [place], [place] [address]   Urine Culture     Status: None   Collection Time: 10/31/21  7:26 PM   Specimen: Urine, Clean Catch  Result Value Ref Range Status   Specimen Description URINE, CLEAN CATCH  Final   Special Requests NONE  Final   Culture   Final    NO GROWTH Performed at [facility], [place] [address] [place],  [place] [address]    Report Status 11/02/2021 FINAL  Final     Labs: BNP (last 3 results) No results for input(s): "BNP" in the last 8760 hours. Basic Metabolic Panel: Recent Labs  Lab 10/31/21 0900 10/31/21 1209 10/31/21 1810 11/01/21 0201  NA 137  --   --  139  K 6.0* 6.0* 4.9 4.3  CL 101  --   --  104  CO2 30  --   --  25  GLUCOSE 94  --   --  188*  BUN 24*  --   --  19  CREATININE 1.47*  --   --  1.18*  CALCIUM 8.4*  --   --  8.7*   Liver Function Tests: Recent Labs  Lab 10/31/21 0900  AST 18  ALT 8  ALKPHOS 97  BILITOT 0.1*  PROT 6.6  ALBUMIN 3.5   No results for input(s): "LIPASE", "AMYLASE" in the last 168 hours. No results for input(s): "AMMONIA" in the last 168 hours. CBC: Recent Labs  Lab 10/31/21 0900 11/01/21 0201  WBC 5.8 5.2  NEUTROABS 3.8  --   HGB 10.8* 10.7*  HCT 35.0* 34.2*  MCV 101.2* 100.0  PLT 160 144*   Cardiac Enzymes: No results for input(s): "CKTOTAL", "CKMB", "CKMBINDEX", "TROPONINI" in the last 168 hours. BNP: Invalid input(s): "POCBNP" CBG: Recent Labs  Lab 11/02/21 1144 11/02/21 1615 11/02/21 2126 11/03/21 0804 11/03/21 1216  GLUCAP 192* 226* 125* 95 115*   D-Dimer No results for input(s): "DDIMER" in the last 72 hours. Hgb A1c No results for input(s): "HGBA1C" in the last 72 hours. Lipid Profile No results for input(s): "CHOL", "HDL", "LDLCALC", "TRIG", "CHOLHDL", "LDLDIRECT" in the last 72 hours. Thyroid function studies No results for input(s): "TSH", "T4TOTAL", "T3FREE", "THYROIDAB" in the last 72 hours.  Invalid input(s): "FREET3" Anemia work up No results for input(s): "VITAMINB12", "FOLATE", "FERRITIN", "TIBC", "IRON", "RETICCTPCT" in the last 72 hours. Urinalysis    Component Value Date/Time   COLORURINE STRAW (A) 11/01/2021 0652   APPEARANCEUR CLEAR 11/01/2021 0652   LABSPEC 1.008 11/01/2021 0652   PHURINE 6.0 11/01/2021 0652   GLUCOSEU NEGATIVE 11/01/2021 0652   HGBUR SMALL (A) 11/01/2021 0652    BILIRUBINUR NEGATIVE 11/01/2021 0652   KETONESUR NEGATIVE 11/01/2021 0652   PROTEINUR NEGATIVE 11/01/2021 0652   NITRITE NEGATIVE 11/01/2021 0652   LEUKOCYTESUR NEGATIVE 11/01/2021 0652   Sepsis Labs Recent Labs  Lab 10/31/21 0900 11/01/21 0201  WBC 5.8 5.2   Microbiology Recent Results (from the past 240 hour(s))  SARS Coronavirus 2 by RT PCR (hospital order, performed in [facility] hospital lab) *cepheid single result test* Anterior Nasal Swab     Status: None   Collection Time: 10/31/21  5:15 PM   Specimen: Anterior Nasal Swab  Result Value Ref Range Status   SARS Coronavirus 2 by RT PCR NEGATIVE NEGATIVE Final    Comment: (NOTE) SARS-CoV-2 target nucleic acids are NOT DETECTED.  The SARS-CoV-2 RNA is generally detectable in upper and lower respiratory specimens during the acute phase of infection. The lowest concentration of SARS-CoV-2 viral copies this assay can detect is 250 copies / mL. A negative result does not preclude SARS-CoV-2 infection and should not be used as the sole basis for treatment or other patient management decisions.  A negative result may occur with improper specimen collection / handling, submission of specimen other than nasopharyngeal swab, presence of viral mutation(s) within the areas targeted by this assay, and inadequate number of viral copies (<250 copies / mL). A negative result must be combined with clinical observations, patient history, and epidemiological information.  Fact Sheet for Patients:   https://www.patel.info/  Fact Sheet for Healthcare Providers: https://hall.com/  This test is not yet approved or  cleared by the [place] FDA and has been authorized for detection and/or diagnosis of SARS-CoV-2 by FDA under an Emergency Use Authorization (EUA).  This EUA will remain in effect (meaning this test can be used) for the  duration of the COVID-19 declaration under Section 564(b)(1) of  the Act, 21 U.S.C. section 360bbb-3(b)(1), unless the authorization is terminated or revoked sooner.  Performed at [facility], [place] [address], [place], [place] [address]   Urine Culture     Status: None   Collection Time: 10/31/21  7:26 PM   Specimen: Urine, Clean Catch  Result Value Ref Range Status   Specimen Description URINE, CLEAN CATCH  Final   Special Requests NONE  Final   Culture   Final    NO GROWTH Performed at [facility], [place] [address] [place], [place] [address]    Report Status 11/02/2021 FINAL  Final     Time coordinating discharge:  I have spent 35 minutes face to face with the patient and on the ward discussing the patients care, assessment, plan and disposition with other care givers. >50% of the time was devoted counseling the patient about the risks and benefits of treatment/Discharge disposition and coordinating care.   SIGNED:   Damita Lack, MD  [facility] 11/03/2021, 1:13 PM   If 7PM-7AM, please contact night-coverage

## 2021-11-03 NOTE — NC FL2 (Signed)
[place] LEVEL OF CARE SCREENING TOOL     IDENTIFICATION  Patient Name: Kristen Mason Birthdate: [birth year] Sex: female Admission Date (Current Location): 10/31/2021  [facility] and [place] Number:  Herbalist and Address:  [facility] [place], [place] [address], [place], [place] [address]      Provider Number: [id number]  Attending Physician Name and Address:  Damita Lack, MD  Relative Name and Phone Number:  [name] (Daughter)   [phone]    Current Level of Care: Hospital Recommended Level of Care: [place] Prior Approval Number:    Date Approved/Denied:   PASRR Number:    Discharge Plan: SNF    Current Diagnoses: Patient Active Problem List   Diagnosis Date Noted   Polyneuropathy associated with underlying disease ([place]) 11/01/2021   Fall from wheelchair 10/31/2021   Hyperkalemia 10/31/2021   Sinus bradycardia 10/31/2021   Transient hypotension 10/31/2021   Hypocalcemia 10/31/2021   PAF (paroxysmal atrial fibrillation) ([place]) 10/31/2021   Renal mass 10/31/2021   Pressure injury of skin 02/09/2019   Frequent UTI 02/07/2019   COVID-19 virus infection 02/06/2019   COVID-19 02/05/2019   Hypoxia    Abdominal pain    Femur fracture, right ([place]) 10/31/2018   Acute on chronic kidney failure ([place]) 10/31/2018   Macrocytic anemia 10/31/2018   Falls 07/24/2017   COPD exacerbation ([place]) 06/16/2017   Parkinson's disease 06/16/2017   Essential tremor 06/16/2017   Peripheral neuropathy 06/16/2017   Depression 06/16/2017   Anxiety 06/16/2017   Acute respiratory failure with hypoxia ([place]) 06/07/2017   Fall at home, initial encounter 06/07/2017   Multiple rib fractures 06/07/2017   Essential hypertension 06/07/2017   Tobacco dependence 06/07/2017   Type 2 diabetes mellitus with diabetic polyneuropathy, with long-term current use of insulin (HCC)     Orientation RESPIRATION BLADDER Height & Weight      Self, Time, Situation, Place  Normal Incontinent, External catheter Weight: 164 lb 14.5 oz (74.8 kg) Height:  '5\' 5"'$  (165.1 cm)  BEHAVIORAL SYMPTOMS/MOOD NEUROLOGICAL BOWEL NUTRITION STATUS      Continent Diet (See DC Summary)  AMBULATORY STATUS COMMUNICATION OF NEEDS Skin   Extensive Assist Verbally Normal                       Personal Care Assistance Level of Assistance  Feeding, Bathing, Dressing Bathing Assistance: Maximum assistance Feeding assistance: Independent Dressing Assistance: Maximum assistance     Functional Limitations Info  Sight, Hearing, Speech Sight Info: Adequate Hearing Info: Adequate Speech Info: Adequate    SPECIAL CARE FACTORS FREQUENCY  PT (By licensed PT), OT (By licensed OT)     PT Frequency: 5x a week OT Frequency: 5x a week            Contractures Contractures Info: Not present    Additional Factors Info  Allergies, Code Status Code Status Info: DNR Allergies Info: Codeine   Statins           Current Medications (11/03/2021):  This is the current hospital active medication list Current Facility-Administered Medications  Medication Dose Route Frequency Provider Last Rate Last Admin   0.9 %  sodium chloride infusion   Intravenous Continuous Norval Morton, MD   Stopped at 11/03/21 1339   acetaminophen (TYLENOL) tablet 650 mg  650 mg Oral Q6H PRN Rex Kras, PA   650 mg at 11/02/21 2204   albuterol (PROVENTIL) (2.5 MG/3ML) 0.083% nebulizer solution 2.5 mg  2.5 mg Nebulization Q2H PRN Fuller Plan A, MD   2.5 mg at 11/01/21 2033   amLODipine (NORVASC) tablet 5 mg  5 mg Oral Daily Amin, Ankit Chirag, MD   5 mg at 11/03/21 1146   aspirin EC tablet 81 mg  81 mg Oral Daily Amin, Ankit Chirag, MD   81 mg at 11/03/21 1146   budesonide (PULMICORT) nebulizer solution 0.5 mg  0.5 mg Nebulization BID Amin, Ankit Chirag, MD   0.5 mg at 11/03/21 0904   busPIRone (BUSPAR) tablet 5 mg  5 mg Oral TID Amin, Ankit Chirag, MD   5 mg at 11/03/21 1146    carbidopa-levodopa (SINEMET IR) 25-100 MG per tablet immediate release 1 tablet  1 tablet Oral TID Fuller Plan A, MD   1 tablet at 11/03/21 1147   citalopram (CELEXA) tablet 40 mg  40 mg Oral Daily Amin, Ankit Chirag, MD   40 mg at 11/03/21 1146   clonazePAM (KLONOPIN) tablet 1 mg  1 mg Oral QHS Smith, Rondell A, MD   1 mg at 11/02/21 2204   dicyclomine (BENTYL) capsule 10 mg  10 mg Oral TID AC & HS Smith, Rondell A, MD   10 mg at 11/03/21 1146   doxycycline (VIBRA-TABS) tablet 100 mg  100 mg Oral Q12H Amin, Ankit Chirag, MD   100 mg at 11/03/21 1146   enoxaparin (LOVENOX) injection 40 mg  40 mg Subcutaneous Q24H Jerilynn Birkenhead, RPH   40 mg at 11/02/21 2205   guaiFENesin (MUCINEX) 12 hr tablet 600 mg  600 mg Oral BID Fuller Plan A, MD   600 mg at 11/03/21 1146   hydrOXYzine (ATARAX) tablet 25 mg  25 mg Oral Q8H PRN Smith, Rondell A, MD       insulin aspart (novoLOG) injection 0-9 Units  0-9 Units Subcutaneous TID WC Smith, Rondell A, MD   3 Units at 11/02/21 1715   ipratropium-albuterol (DUONEB) 0.5-2.5 (3) MG/3ML nebulizer solution 3 mL  3 mL Nebulization BID Amin, Ankit Chirag, MD   3 mL at 11/03/21 0903   melatonin tablet 3 mg  3 mg Oral QHS Kathryne Eriksson, NP   3 mg at 11/03/21 0059   methylPREDNISolone sodium succinate (SOLU-MEDROL) 125 mg/2 mL injection 80 mg  80 mg Intravenous Daily Amin, Ankit Chirag, MD   80 mg at 11/03/21 1146   ondansetron (ZOFRAN) tablet 4 mg  4 mg Oral Q6H PRN Fuller Plan A, MD       Or   ondansetron (ZOFRAN) injection 4 mg  4 mg Intravenous Q6H PRN Tamala Julian, Rondell A, MD       primidone (MYSOLINE) tablet 250 mg  250 mg Oral BID Tamala Julian, Rondell A, MD   250 mg at 11/03/21 1146   rosuvastatin (CRESTOR) tablet 5 mg  5 mg Oral Daily Smith, Rondell A, MD   5 mg at 11/03/21 1146   sodium chloride flush (NS) 0.9 % injection 3 mL  3 mL Intravenous Q12H Smith, Rondell A, MD   3 mL at 11/03/21 1338     Discharge Medications: Please see discharge summary for a  list of discharge medications.  Relevant Imaging Results:  Relevant Lab Results:   Additional Information SSN: [id number]  Reece Agar, [place]

## 2021-11-03 NOTE — Progress Notes (Addendum)
Patient discharged back to SNF, IV removed, belongings returned. [facility] provided transportation   Patient spoke with her daughter and made her aware she would be discharged today.

## 2021-11-03 NOTE — Progress Notes (Signed)
Report called to [place], LPN at [facility].

## 2022-07-03 ENCOUNTER — Emergency Department (HOSPITAL_COMMUNITY)
Admission: EM | Admit: 2022-07-03 | Discharge: 2022-07-04 | Disposition: A | Discharge status: Rehabilitation Facility | Payer: Medicare (Managed Care) | Attending: Emergency Medicine | Admitting: Emergency Medicine

## 2022-07-03 DIAGNOSIS — Z23 Encounter for immunization: Secondary | ICD-10-CM | POA: Insufficient documentation

## 2022-07-03 DIAGNOSIS — E119 Type 2 diabetes mellitus without complications: Secondary | ICD-10-CM | POA: Diagnosis not present

## 2022-07-03 DIAGNOSIS — S0181XA Laceration without foreign body of other part of head, initial encounter: Secondary | ICD-10-CM | POA: Diagnosis present

## 2022-07-03 DIAGNOSIS — W06XXXA Fall from bed, initial encounter: Secondary | ICD-10-CM | POA: Diagnosis not present

## 2022-07-03 DIAGNOSIS — R911 Solitary pulmonary nodule: Secondary | ICD-10-CM | POA: Diagnosis not present

## 2022-07-03 DIAGNOSIS — W19XXXA Unspecified fall, initial encounter: Secondary | ICD-10-CM

## 2022-07-03 DIAGNOSIS — I10 Essential (primary) hypertension: Secondary | ICD-10-CM | POA: Insufficient documentation

## 2022-07-03 MED ORDER — TETANUS-DIPHTH-ACELL PERTUSSIS 5-2.5-18.5 LF-MCG/0.5 IM SUSY
0.5000 mL | PREFILLED_SYRINGE | Freq: Once | INTRAMUSCULAR | Status: AC
  Administered 2022-07-04: 0.5 mL via INTRAMUSCULAR
  Filled 2022-07-03: qty 0.5

## 2022-07-03 MED ORDER — LIDOCAINE-EPINEPHRINE-TETRACAINE (LET) TOPICAL GEL
3.0000 mL | Freq: Once | TOPICAL | Status: AC
  Administered 2022-07-04: 3 mL via TOPICAL
  Filled 2022-07-03: qty 3

## 2022-07-03 NOTE — Progress Notes (Signed)
Orthopedic Tech Progress Note Patient Details:  Kristen Mason 12/23/1934 [id number]  Patient ID: Kristen Mason, female   DOB: 1934-12-07, 88 y.o.   MRN: [id number] Level 2 trauma.  Artis Flock Avraham Benish 07/03/2022, 11:54 PM

## 2022-07-04 ENCOUNTER — Encounter (HOSPITAL_COMMUNITY): Payer: Self-pay | Admitting: Emergency Medicine

## 2022-07-04 ENCOUNTER — Other Ambulatory Visit: Payer: Self-pay

## 2022-07-04 ENCOUNTER — Emergency Department (HOSPITAL_COMMUNITY): Payer: Medicare (Managed Care)

## 2022-07-04 DIAGNOSIS — S0181XA Laceration without foreign body of other part of head, initial encounter: Secondary | ICD-10-CM | POA: Diagnosis not present

## 2022-07-04 LAB — I-STAT CHEM 8, ED
BUN: 17 mg/dL (ref 8–23)
Calcium, Ion: 0.91 mmol/L — ABNORMAL LOW (ref 1.15–1.40)
Chloride: 105 mmol/L (ref 98–111)
Creatinine, Ser: 1 mg/dL (ref 0.44–1.00)
Glucose, Bld: 134 mg/dL — ABNORMAL HIGH (ref 70–99)
HCT: 33 % — ABNORMAL LOW (ref 36.0–46.0)
Hemoglobin: 11.2 g/dL — ABNORMAL LOW (ref 12.0–15.0)
Potassium: 3.8 mmol/L (ref 3.5–5.1)
Sodium: 138 mmol/L (ref 135–145)
TCO2: 27 mmol/L (ref 22–32)

## 2022-07-04 LAB — CBC WITH DIFFERENTIAL/PLATELET
Abs Immature Granulocytes: 0.02 10*3/uL (ref 0.00–0.07)
Basophils Absolute: 0 10*3/uL (ref 0.0–0.1)
Basophils Relative: 1 %
Eosinophils Absolute: 0.4 10*3/uL (ref 0.0–0.5)
Eosinophils Relative: 7 %
HCT: 35.1 % — ABNORMAL LOW (ref 36.0–46.0)
Hemoglobin: 10.9 g/dL — ABNORMAL LOW (ref 12.0–15.0)
Immature Granulocytes: 0 %
Lymphocytes Relative: 22 %
Lymphs Abs: 1.2 10*3/uL (ref 0.7–4.0)
MCH: 29.8 pg (ref 26.0–34.0)
MCHC: 31.1 g/dL (ref 30.0–36.0)
MCV: 95.9 fL (ref 80.0–100.0)
Monocytes Absolute: 0.8 10*3/uL (ref 0.1–1.0)
Monocytes Relative: 14 %
Neutro Abs: 3.3 10*3/uL (ref 1.7–7.7)
Neutrophils Relative %: 56 %
Platelets: 127 10*3/uL — ABNORMAL LOW (ref 150–400)
RBC: 3.66 MIL/uL — ABNORMAL LOW (ref 3.87–5.11)
RDW: 15.1 % (ref 11.5–15.5)
WBC: 5.7 10*3/uL (ref 4.0–10.5)
nRBC: 0 % (ref 0.0–0.2)

## 2022-07-04 MED ORDER — CALCIUM CARBONATE ANTACID 500 MG PO CHEW
2.0000 | CHEWABLE_TABLET | Freq: Once | ORAL | Status: AC
  Administered 2022-07-04: 400 mg via ORAL
  Filled 2022-07-04: qty 2

## 2022-07-04 NOTE — ED Notes (Signed)
Patient transported to CT 

## 2022-07-04 NOTE — ED Triage Notes (Signed)
Fall on blood thinners from [facility].

## 2022-07-04 NOTE — ED Provider Notes (Signed)
[place] EMERGENCY DEPARTMENT AT [facility] Provider Note   CSN: [id number] Arrival date & time: 07/03/22  2348     History  Chief Complaint  Patient presents with   Kristen Mason is a 88 y.o. female.  The history is provided by the patient and the EMS personnel.  Fall This is a new problem. The current episode started less than 1 hour ago. The problem occurs constantly. The problem has not changed since onset.Pertinent negatives include no chest pain, no abdominal pain, no headaches and no shortness of breath. Nothing aggravates the symptoms. Nothing relieves the symptoms. She has tried nothing for the symptoms. The treatment provided no relief.  Patient with h/o falls who Rolled out of bed, fall on a DOAC.  Forehead laceration.      Past Medical History:  Diagnosis Date   Diabetes [facility])    Essential tremor    Fall 06/07/2017   rib fractures   GERD (gastroesophageal reflux disease)    Hypertension    Peripheral neuropathy      Home Medications Prior to Admission medications   Medication Sig Start Date End Date Taking? Authorizing Provider  acetaminophen (TYLENOL) 325 MG tablet Take 2 tablets (650 mg total) by mouth every 6 (six) hours as needed for mild pain (or Fever >/= 101). 11/09/18   Montez Morita, PA-C  albuterol (VENTOLIN HFA) 108 (90 Base) MCG/ACT inhaler Inhale 2 puffs into the lungs every 6 (six) hours as needed for wheezing or shortness of breath. 11/03/21   Amin, Loura Halt, MD  amLODipine (NORVASC) 5 MG tablet Take 5 mg by mouth daily. Patient not taking: Reported on 11/03/2021 05/27/20   [provider]  aspirin EC 81 MG tablet Take 81 mg by mouth daily.    [provider]  busPIRone (BUSPAR) 5 MG tablet Take 5 mg by mouth 3 (three) times daily. 05/25/20   [provider]  capsicum (ZOSTRIX) 0.075 % topical cream Apply 1 Application topically 3 (three) times daily.    [provider]  carbidopa-levodopa  (SINEMET IR) 25-100 MG tablet Take 1 tablet by mouth 3 (three) times daily. 05/14/20   [provider]  Carboxymethylcellulose Sodium (REFRESH TEARS OP) Place 1 drop into both eyes in the morning, at noon, in the evening, and at bedtime.    [provider]  citalopram (CELEXA) 20 MG tablet Take 20 mg by mouth daily.    [provider]  clonazePAM (KLONOPIN) 1 MG tablet Take 1 tablet (1 mg total) by mouth at bedtime. 11/03/21   Amin, Loura Halt, MD  CRANBERRY PO Take 2 tablets by mouth in the morning and at bedtime.    [provider]  diclofenac Sodium (VOLTAREN) 1 % GEL Apply 4 g topically in the morning, at noon, and at bedtime. Both knees 01/03/20   [provider]  dicyclomine (BENTYL) 10 MG capsule Take 10 mg by mouth 4 (four) times daily -  before meals and at bedtime. 05/23/20   [provider]  fluticasone (FLONASE) 50 MCG/ACT nasal spray Place 1 spray into both nostrils daily. 04/30/20   [provider]  glipiZIDE (GLUCOTROL) 5 MG tablet Take 5 mg by mouth daily. 05/23/20   [provider]  guaiFENesin (MUCINEX) 600 MG 12 hr tablet Take 600 mg by mouth 2 (two) times daily.    [provider]  hydrocortisone (ANUSOL-HC) 25 MG suppository Place 25 mg rectally 2 (two) times daily as needed for  hemorrhoids or anal itching.    [provider]  hydrOXYzine (ATARAX/VISTARIL) 25 MG tablet Take 25 mg by mouth every 8 (eight) hours as needed for itching. 06/02/20   [provider]  LACTOBACILLUS PROBIOTIC PO Take 1 capsule by mouth 2 (two) times daily.    [provider]  lidocaine 4 % Place 1 patch onto the skin daily.    [provider]  Magnesium 400 MG TABS Take 400 mg by mouth daily.    [provider]  pantoprazole (PROTONIX) 20 MG tablet Take 20 mg by mouth daily. 05/23/20   [provider]  polyethylene glycol powder (GLYCOLAX/MIRALAX) 17 GM/SCOOP powder Take 17 g by  mouth daily.    [provider]  pregabalin (LYRICA) 50 MG capsule Take 1 capsule (50 mg total) by mouth 2 (two) times daily for 5 days. 11/03/21 11/08/21  Amin, Loura Halt, MD  primidone (MYSOLINE) 250 MG tablet Take 250 mg by mouth 3 (three) times daily. 05/23/20   [provider]  rosuvastatin (CRESTOR) 5 MG tablet Take 5 mg by mouth daily. 05/14/20   [provider]  trimethoprim (TRIMPEX) 100 MG tablet Take 100 mg by mouth daily. 10/06/21   [provider]  Vitamin D, Ergocalciferol, (DRISDOL) 1.25 MG (50000 UNIT) CAPS capsule Take 50,000 Units by mouth every 7 (seven) days. Thursday 03/08/20   [provider]      Allergies    Codeine and Statins    Review of Systems   Review of Systems  HENT:  Positive for nosebleeds.        Nosebleed from fall that stopped   Respiratory:  Negative for shortness of breath.   Cardiovascular:  Negative for chest pain.  Gastrointestinal:  Negative for abdominal pain.  Musculoskeletal:  Negative for neck pain.  Skin:  Positive for wound.  Neurological:  Negative for weakness, numbness and headaches.    Physical Exam Updated Vital Signs BP (!) 152/57   Pulse 69   Temp 98 F (36.7 C) (Oral)   Resp 19   Ht 5\' 5"  (1.651 m)   Wt 75 kg   SpO2 90%   BMI 27.51 kg/m  Physical Exam Vitals and nursing note reviewed. Exam conducted with a chaperone present.  Constitutional:      General: She is not in acute distress.    Appearance: Normal appearance. She is well-developed.  HENT:     Head: Normocephalic.      Right Ear: Tympanic membrane normal. No hemotympanum.     Left Ear: Tympanic membrane normal. No hemotympanum.     Nose: No rhinorrhea.     Right Nostril: No epistaxis or septal hematoma.     Left Nostril: No epistaxis or septal hematoma.     Mouth/Throat:     Mouth: Mucous membranes are moist.  Eyes:     Pupils: Pupils are equal, round, and reactive to light.  Cardiovascular:     Rate and  Rhythm: Normal rate and regular rhythm.     Pulses: Normal pulses.     Heart sounds: Normal heart sounds.  Pulmonary:     Effort: Pulmonary effort is normal. No respiratory distress.     Breath sounds: Normal breath sounds.  Abdominal:     General: Bowel sounds are normal. There is no distension.     Palpations: Abdomen is soft.     Tenderness: There is no abdominal tenderness. There is no guarding or rebound.  Genitourinary:    Vagina: No  vaginal discharge.  Musculoskeletal:        General: Normal range of motion.     Right shoulder: Normal.     Left shoulder: Normal.     Right wrist: No bony tenderness, snuff box tenderness or crepitus.     Left wrist: No bony tenderness, snuff box tenderness or crepitus.     Right hand: Normal.     Left hand: Normal.     Cervical back: Normal range of motion and neck supple. No tenderness.     Right hip: Normal.     Left hip: Normal.     Right knee: No LCL laxity, MCL laxity, ACL laxity or PCL laxity. Normal patellar mobility.     Instability Tests: Anterior drawer test negative.     Left knee: No LCL laxity, MCL laxity, ACL laxity or PCL laxity.Normal patellar mobility.     Instability Tests: Anterior drawer test negative. Posterior drawer test negative.     Right ankle: Normal.     Right Achilles Tendon: Normal.     Left ankle: Normal.     Left Achilles Tendon: Normal.     Right foot: Normal.     Left foot: Normal.  Skin:    General: Skin is warm and dry.     Capillary Refill: Capillary refill takes less than 2 seconds.     Findings: No erythema or rash.  Neurological:     General: No focal deficit present.     Mental Status: She is alert and oriented to person, place, and time.     Deep Tendon Reflexes: Reflexes normal.  Psychiatric:        Mood and Affect: Mood normal.        Behavior: Behavior normal.     ED Results / Procedures / Treatments   Labs (all labs ordered are listed, but only abnormal results are displayed) Results  for orders placed or performed during the hospital encounter of 07/03/22  CBC with Differential  Result Value Ref Range   WBC 5.7 4.0 - 10.5 K/uL   RBC 3.66 (L) 3.87 - 5.11 MIL/uL   Hemoglobin 10.9 (L) 12.0 - 15.0 g/dL   HCT 62.1 (L) 30.8 - 65.7 %   MCV 95.9 80.0 - 100.0 fL   MCH 29.8 26.0 - 34.0 pg   MCHC 31.1 30.0 - 36.0 g/dL   RDW 84.6 96.2 - 95.2 %   Platelets 127 (L) 150 - 400 K/uL   nRBC 0.0 0.0 - 0.2 %   Neutrophils Relative % 56 %   Neutro Abs 3.3 1.7 - 7.7 K/uL   Lymphocytes Relative 22 %   Lymphs Abs 1.2 0.7 - 4.0 K/uL   Monocytes Relative 14 %   Monocytes Absolute 0.8 0.1 - 1.0 K/uL   Eosinophils Relative 7 %   Eosinophils Absolute 0.4 0.0 - 0.5 K/uL   Basophils Relative 1 %   Basophils Absolute 0.0 0.0 - 0.1 K/uL   Immature Granulocytes 0 %   Abs Immature Granulocytes 0.02 0.00 - 0.07 K/uL  I-stat chem 8, ED (not at [facility], [facility] or [facility])  Result Value Ref Range   Sodium 138 135 - 145 mmol/L   Potassium 3.8 3.5 - 5.1 mmol/L   Chloride 105 98 - 111 mmol/L   BUN 17 8 - 23 mg/dL   Creatinine, Ser 8.41 0.44 - 1.00 mg/dL   Glucose, Bld 324 (H) 70 - 99 mg/dL   Calcium, Ion 4.01 (L) 1.15 - 1.40 mmol/L  TCO2 27 22 - 32 mmol/L   Hemoglobin 11.2 (L) 12.0 - 15.0 g/dL   HCT 91.4 (L) 78.2 - 95.6 %   CT Head Wo Contrast  Result Date: 07/04/2022 CLINICAL DATA:  Blunt polytrauma EXAM: CT HEAD WITHOUT CONTRAST CT MAXILLOFACIAL WITHOUT CONTRAST CT CERVICAL SPINE WITHOUT CONTRAST TECHNIQUE: Multidetector CT imaging of the head, cervical spine, and maxillofacial structures were performed using the standard protocol without intravenous contrast. Multiplanar CT image reconstructions of the cervical spine and maxillofacial structures were also generated. RADIATION DOSE REDUCTION: This exam was performed according to the departmental dose-optimization program which includes automated exposure control, adjustment of the mA and/or kV according to patient size and/or use of iterative  reconstruction technique. COMPARISON:  CT head and cervical spine 10/31/2021 FINDINGS: CT HEAD FINDINGS Brain: No intracranial hemorrhage, mass effect, or evidence of acute infarct. No hydrocephalus. No extra-axial fluid collection. Generalized cerebral atrophy. Ill-defined hypoattenuation within the cerebral white matter is nonspecific but consistent with chronic small vessel ischemic disease. Vascular: No hyperdense vessel. Intracranial arterial calcification. Skull: No fracture or focal lesion. Right frontal, midline forehead, and right periorbital scalp hematomas. Other: None. CT MAXILLOFACIAL FINDINGS Osseous: No fracture or mandibular dislocation. No destructive process. Orbits: Negative. No traumatic or inflammatory finding. Sinuses: Mild mucosal thickening in the paranasal sinuses. No mastoid effusion. Soft tissues: Right frontal, midline forehead, and right periorbital scalp hematomas. CT CERVICAL SPINE FINDINGS Alignment: No evidence of traumatic malalignment. Skull base and vertebrae: No acute fracture. No primary bone lesion or focal pathologic process. Soft tissues and spinal canal: No prevertebral fluid or swelling. No visible canal hematoma. Disc levels: Multilevel advanced spondylosis, disc space height loss, and degenerative endplate changes greatest at C6-C7, not substantially changed from 10/31/2021. Upper chest: Secretions in the trachea. Multinodular enlarged thyroid stable since 2018. Other: Carotid calcification. IMPRESSION: 1. No acute intracranial abnormality. 2. Right frontal, midline forehead, and right periorbital scalp hematomas. No calvarial fracture. 3. No acute facial bone fracture. 4. No acute cervical spine fracture. Electronically Signed   By: Minerva Fester M.D.   On: 07/04/2022 01:41   CT Maxillofacial Wo Contrast  Result Date: 07/04/2022 CLINICAL DATA:  Blunt polytrauma EXAM: CT HEAD WITHOUT CONTRAST CT MAXILLOFACIAL WITHOUT CONTRAST CT CERVICAL SPINE WITHOUT CONTRAST  TECHNIQUE: Multidetector CT imaging of the head, cervical spine, and maxillofacial structures were performed using the standard protocol without intravenous contrast. Multiplanar CT image reconstructions of the cervical spine and maxillofacial structures were also generated. RADIATION DOSE REDUCTION: This exam was performed according to the departmental dose-optimization program which includes automated exposure control, adjustment of the mA and/or kV according to patient size and/or use of iterative reconstruction technique. COMPARISON:  CT head and cervical spine 10/31/2021 FINDINGS: CT HEAD FINDINGS Brain: No intracranial hemorrhage, mass effect, or evidence of acute infarct. No hydrocephalus. No extra-axial fluid collection. Generalized cerebral atrophy. Ill-defined hypoattenuation within the cerebral white matter is nonspecific but consistent with chronic small vessel ischemic disease. Vascular: No hyperdense vessel. Intracranial arterial calcification. Skull: No fracture or focal lesion. Right frontal, midline forehead, and right periorbital scalp hematomas. Other: None. CT MAXILLOFACIAL FINDINGS Osseous: No fracture or mandibular dislocation. No destructive process. Orbits: Negative. No traumatic or inflammatory finding. Sinuses: Mild mucosal thickening in the paranasal sinuses. No mastoid effusion. Soft tissues: Right frontal, midline forehead, and right periorbital scalp hematomas. CT CERVICAL SPINE FINDINGS Alignment: No evidence of traumatic malalignment. Skull base and vertebrae: No acute fracture. No primary bone lesion or focal pathologic process. Soft tissues and spinal canal:  No prevertebral fluid or swelling. No visible canal hematoma. Disc levels: Multilevel advanced spondylosis, disc space height loss, and degenerative endplate changes greatest at C6-C7, not substantially changed from 10/31/2021. Upper chest: Secretions in the trachea. Multinodular enlarged thyroid stable since 2018. Other:  Carotid calcification. IMPRESSION: 1. No acute intracranial abnormality. 2. Right frontal, midline forehead, and right periorbital scalp hematomas. No calvarial fracture. 3. No acute facial bone fracture. 4. No acute cervical spine fracture. Electronically Signed   By: Minerva Fester M.D.   On: 07/04/2022 01:41   DG Chest Portable 1 View  Result Date: 07/04/2022 CLINICAL DATA:  Level 2 trauma, fall EXAM: PORTABLE CHEST 1 VIEW COMPARISON:  CT 07/27/2021 FINDINGS: Right paratracheal soft tissue mass at the thoracic inlet with deviation of the trachea to the left relates to multinodular thyroid goiter, better seen on prior CT examination. 11 mm nodular density noted within the left mid lung zone peripherally, progressive since prior examination. Lungs are otherwise clear. No pneumothorax or pleural effusion. Cardiac size within normal limits. Pulmonary vascularity is normal. No acute bone abnormality. IMPRESSION: 1. No radiographic evidence of acute cardiopulmonary disease. 2. Progressive 11 mm nodular density within the left mid lung zone peripherally. This could be further assessed with dedicated CT imaging. Electronically Signed   By: Helyn Numbers M.D.   On: 07/04/2022 00:09    Radiology CT Head Wo Contrast  Result Date: 07/04/2022 CLINICAL DATA:  Blunt polytrauma EXAM: CT HEAD WITHOUT CONTRAST CT MAXILLOFACIAL WITHOUT CONTRAST CT CERVICAL SPINE WITHOUT CONTRAST TECHNIQUE: Multidetector CT imaging of the head, cervical spine, and maxillofacial structures were performed using the standard protocol without intravenous contrast. Multiplanar CT image reconstructions of the cervical spine and maxillofacial structures were also generated. RADIATION DOSE REDUCTION: This exam was performed according to the departmental dose-optimization program which includes automated exposure control, adjustment of the mA and/or kV according to patient size and/or use of iterative reconstruction technique. COMPARISON:  CT head  and cervical spine 10/31/2021 FINDINGS: CT HEAD FINDINGS Brain: No intracranial hemorrhage, mass effect, or evidence of acute infarct. No hydrocephalus. No extra-axial fluid collection. Generalized cerebral atrophy. Ill-defined hypoattenuation within the cerebral white matter is nonspecific but consistent with chronic small vessel ischemic disease. Vascular: No hyperdense vessel. Intracranial arterial calcification. Skull: No fracture or focal lesion. Right frontal, midline forehead, and right periorbital scalp hematomas. Other: None. CT MAXILLOFACIAL FINDINGS Osseous: No fracture or mandibular dislocation. No destructive process. Orbits: Negative. No traumatic or inflammatory finding. Sinuses: Mild mucosal thickening in the paranasal sinuses. No mastoid effusion. Soft tissues: Right frontal, midline forehead, and right periorbital scalp hematomas. CT CERVICAL SPINE FINDINGS Alignment: No evidence of traumatic malalignment. Skull base and vertebrae: No acute fracture. No primary bone lesion or focal pathologic process. Soft tissues and spinal canal: No prevertebral fluid or swelling. No visible canal hematoma. Disc levels: Multilevel advanced spondylosis, disc space height loss, and degenerative endplate changes greatest at C6-C7, not substantially changed from 10/31/2021. Upper chest: Secretions in the trachea. Multinodular enlarged thyroid stable since 2018. Other: Carotid calcification. IMPRESSION: 1. No acute intracranial abnormality. 2. Right frontal, midline forehead, and right periorbital scalp hematomas. No calvarial fracture. 3. No acute facial bone fracture. 4. No acute cervical spine fracture. Electronically Signed   By: Minerva Fester M.D.   On: 07/04/2022 01:41   CT Maxillofacial Wo Contrast  Result Date: 07/04/2022 CLINICAL DATA:  Blunt polytrauma EXAM: CT HEAD WITHOUT CONTRAST CT MAXILLOFACIAL WITHOUT CONTRAST CT CERVICAL SPINE WITHOUT CONTRAST TECHNIQUE: Multidetector CT imaging of the  head,  cervical spine, and maxillofacial structures were performed using the standard protocol without intravenous contrast. Multiplanar CT image reconstructions of the cervical spine and maxillofacial structures were also generated. RADIATION DOSE REDUCTION: This exam was performed according to the departmental dose-optimization program which includes automated exposure control, adjustment of the mA and/or kV according to patient size and/or use of iterative reconstruction technique. COMPARISON:  CT head and cervical spine 10/31/2021 FINDINGS: CT HEAD FINDINGS Brain: No intracranial hemorrhage, mass effect, or evidence of acute infarct. No hydrocephalus. No extra-axial fluid collection. Generalized cerebral atrophy. Ill-defined hypoattenuation within the cerebral white matter is nonspecific but consistent with chronic small vessel ischemic disease. Vascular: No hyperdense vessel. Intracranial arterial calcification. Skull: No fracture or focal lesion. Right frontal, midline forehead, and right periorbital scalp hematomas. Other: None. CT MAXILLOFACIAL FINDINGS Osseous: No fracture or mandibular dislocation. No destructive process. Orbits: Negative. No traumatic or inflammatory finding. Sinuses: Mild mucosal thickening in the paranasal sinuses. No mastoid effusion. Soft tissues: Right frontal, midline forehead, and right periorbital scalp hematomas. CT CERVICAL SPINE FINDINGS Alignment: No evidence of traumatic malalignment. Skull base and vertebrae: No acute fracture. No primary bone lesion or focal pathologic process. Soft tissues and spinal canal: No prevertebral fluid or swelling. No visible canal hematoma. Disc levels: Multilevel advanced spondylosis, disc space height loss, and degenerative endplate changes greatest at C6-C7, not substantially changed from 10/31/2021. Upper chest: Secretions in the trachea. Multinodular enlarged thyroid stable since 2018. Other: Carotid calcification. IMPRESSION: 1. No acute  intracranial abnormality. 2. Right frontal, midline forehead, and right periorbital scalp hematomas. No calvarial fracture. 3. No acute facial bone fracture. 4. No acute cervical spine fracture. Electronically Signed   By: Minerva Fester M.D.   On: 07/04/2022 01:41   DG Chest Portable 1 View  Result Date: 07/04/2022 CLINICAL DATA:  Level 2 trauma, fall EXAM: PORTABLE CHEST 1 VIEW COMPARISON:  CT 07/27/2021 FINDINGS: Right paratracheal soft tissue mass at the thoracic inlet with deviation of the trachea to the left relates to multinodular thyroid goiter, better seen on prior CT examination. 11 mm nodular density noted within the left mid lung zone peripherally, progressive since prior examination. Lungs are otherwise clear. No pneumothorax or pleural effusion. Cardiac size within normal limits. Pulmonary vascularity is normal. No acute bone abnormality. IMPRESSION: 1. No radiographic evidence of acute cardiopulmonary disease. 2. Progressive 11 mm nodular density within the left mid lung zone peripherally. This could be further assessed with dedicated CT imaging. Electronically Signed   By: Helyn Numbers M.D.   On: 07/04/2022 00:09    Procedures .Marland KitchenLaceration Repair  Date/Time: 07/04/2022 1:54 AM  Performed by: Cy Blamer, MD Authorized by: Cy Blamer, MD   Consent:    Consent obtained:  Verbal   Consent given by:  Patient   Risks discussed:  Need for additional repair, nerve damage, pain, poor cosmetic result, poor wound healing, retained foreign body, tendon damage and vascular damage   Alternatives discussed:  No treatment Universal protocol:    Patient identity confirmed:  Arm band Anesthesia:    Anesthesia method:  Topical application   Topical anesthetic:  LET Laceration details:    Location: forehead.   Length (cm):  2.6   Depth (mm):  1 Pre-procedure details:    Preparation:  Patient was prepped and draped in usual sterile fashion Exploration:    Imaging outcome: foreign  body not noted     Wound extent: fascia not violated     Contaminated: no   Treatment:  Area cleansed with:  Povidone-iodine, chlorhexidine and Shur-Clens   Amount of cleaning:  Extensive   Debridement:  None   Undermining:  None   Scar revision: no   Skin repair:    Repair method:  Tissue adhesive Approximation:    Approximation:  Close Repair type:    Repair type:  Simple Post-procedure details:    Dressing:  Sterile dressing   Procedure completion:  Tolerated well, no immediate complications     Medications Ordered in ED Medications  calcium carbonate (TUMS - dosed in mg elemental calcium) chewable tablet 400 mg of elemental calcium (has no administration in time range)  Tdap (BOOSTRIX) injection 0.5 mL (0.5 mLs Intramuscular Given 07/04/22 0005)  lidocaine-EPINEPHrine-tetracaine (LET) topical gel (3 mLs Topical Given 07/04/22 0004)    ED Course/ Medical Decision Making/ A&P                             Medical Decision Making Patient who fell on a DOAC  Problems Addressed: Facial laceration, initial encounter: acute illness or injury    Details: Repaired, wound care instructions given  Fall, initial encounter: acute illness or injury Hypocalcemia: acute illness or injury    Details: Treated with tums in the ED, have advised patient to follow up with PMD for recheck. No chvosteks  Pulmonary nodule: chronic illness or injury with exacerbation, progression, or side effects of treatment    Details: Informed patient and placed on discharge that needs an outpatient chest CT to better evaluate progressive nodule on CXR.  Patient verbalizes understanding   Amount and/or Complexity of Data Reviewed Independent Historian: EMS    Details: See above  External Data Reviewed: notes.    Details: Previous notes reviewed  Labs: ordered.    Details: Calcium low .91 treated in the ED.  Normal white count 5.7 , low hemoglobin 10.9, low platelet count 127.  Normal sodium 138 normal  potassium 3.8, normal creatinine .91 Radiology: ordered and independent interpretation performed.    Details: Negative CT head negative,  CXR by me   Risk OTC drugs. Prescription drug management. Risk Details: Laceration repaired.  Patient informed of low calcium and need to take tums and follow up with PMD for recheck.  Also informed of nodule seen on CXR and need for outpatient chest CT for recheck of theis lesion patient verbalizes understanding of this.  This has also been printed on discharge paperwork so patient may take to PMD for ongoing testing and treatment.      Final Clinical Impression(s) / ED Diagnoses Final diagnoses:  Fall, initial encounter  Facial laceration, initial encounter  Hypocalcemia   Return for intractable cough, coughing up blood, fevers > 100.4 unrelieved by medication, shortness of breath, intractable vomiting, chest pain, shortness of breath, weakness, numbness, changes in speech, facial asymmetry, abdominal pain, passing out, Inability to tolerate liquids or food, cough, altered mental status or any concerns. No signs of systemic illness or infection. The patient is nontoxic-appearing on exam and vital signs are within normal limits.  I have reviewed the triage vital signs and the nursing notes. Pertinent labs & imaging results that were available during my care of the patient were reviewed by me and considered in my medical decision making (see chart for details). After history, exam, and medical workup I feel the patient has been appropriately medically screened and is safe for discharge home. Pertinent diagnoses were discussed with the patient. Patient was given return  precautions Rx / DC Orders ED Discharge Orders     None         Quilla Freeze, MD 07/04/22 1610

## 2022-07-04 NOTE — ED Notes (Signed)
Called [facility] to transport patient back to [facility] 

## 2022-07-04 NOTE — Progress Notes (Signed)
   07/04/22 0000  Spiritual Encounters  Type of Visit Initial  Care provided to: Patient  Referral source Trauma page  Reason for visit Trauma  OnCall Visit Yes   Ch responded to trauma page. There was no family at bedside. Ch provided compassionate presence. No follow-up needed at this time.

## 2022-07-04 NOTE — Discharge Instructions (Signed)
Take 2 tums daily and have calcium recheck and have your doctor order outpatient chest CT to assess lung nodule

## 2022-07-04 NOTE — ED Notes (Signed)
[facility] called for pt dc, no answer gotten to phone call, message leaved.
